# Patient Record
Sex: Male | Born: 1945 | Race: White | Hispanic: No | Marital: Married | Smoking: Never smoker
Health system: Southern US, Community
[De-identification: ages and names within clinical notes are randomized; demographics above are authoritative.]

## PROBLEM LIST (undated history)

## (undated) DIAGNOSIS — H5120 Internuclear ophthalmoplegia, unspecified eye: Secondary | ICD-10-CM

## (undated) DIAGNOSIS — M51369 Other intervertebral disc degeneration, lumbar region without mention of lumbar back pain or lower extremity pain: Secondary | ICD-10-CM

## (undated) DIAGNOSIS — G709 Myoneural disorder, unspecified: Secondary | ICD-10-CM

## (undated) DIAGNOSIS — F329 Major depressive disorder, single episode, unspecified: Secondary | ICD-10-CM

## (undated) DIAGNOSIS — M2170 Unequal limb length (acquired), unspecified site: Secondary | ICD-10-CM

## (undated) DIAGNOSIS — Z87442 Personal history of urinary calculi: Secondary | ICD-10-CM

## (undated) DIAGNOSIS — N529 Male erectile dysfunction, unspecified: Secondary | ICD-10-CM

## (undated) DIAGNOSIS — N200 Calculus of kidney: Secondary | ICD-10-CM

## (undated) DIAGNOSIS — M1990 Unspecified osteoarthritis, unspecified site: Secondary | ICD-10-CM

## (undated) DIAGNOSIS — M5136 Other intervertebral disc degeneration, lumbar region: Secondary | ICD-10-CM

## (undated) DIAGNOSIS — D649 Anemia, unspecified: Secondary | ICD-10-CM

## (undated) DIAGNOSIS — C801 Malignant (primary) neoplasm, unspecified: Secondary | ICD-10-CM

## (undated) DIAGNOSIS — K8020 Calculus of gallbladder without cholecystitis without obstruction: Secondary | ICD-10-CM

## (undated) DIAGNOSIS — H9190 Unspecified hearing loss, unspecified ear: Secondary | ICD-10-CM

## (undated) DIAGNOSIS — E7800 Pure hypercholesterolemia, unspecified: Secondary | ICD-10-CM

## (undated) DIAGNOSIS — I639 Cerebral infarction, unspecified: Secondary | ICD-10-CM

## (undated) DIAGNOSIS — M48061 Spinal stenosis, lumbar region without neurogenic claudication: Secondary | ICD-10-CM

## (undated) DIAGNOSIS — G500 Trigeminal neuralgia: Secondary | ICD-10-CM

## (undated) DIAGNOSIS — I10 Essential (primary) hypertension: Secondary | ICD-10-CM

## (undated) DIAGNOSIS — F32A Depression, unspecified: Secondary | ICD-10-CM

## (undated) DIAGNOSIS — R269 Unspecified abnormalities of gait and mobility: Secondary | ICD-10-CM

## (undated) DIAGNOSIS — F419 Anxiety disorder, unspecified: Secondary | ICD-10-CM

## (undated) HISTORY — PX: JOINT REPLACEMENT: SHX530

## (undated) HISTORY — PX: BACK SURGERY: SHX140

## (undated) HISTORY — DX: Unspecified abnormalities of gait and mobility: R26.9

## (undated) HISTORY — PX: EYE SURGERY: SHX253

## (undated) HISTORY — PX: COLONOSCOPY: SHX174

---

## 2001-02-17 ENCOUNTER — Encounter: Payer: Self-pay | Admitting: Neurology

## 2001-02-17 ENCOUNTER — Ambulatory Visit (HOSPITAL_COMMUNITY): Admission: RE | Admit: 2001-02-17 | Discharge: 2001-02-17 | Payer: Self-pay | Admitting: Neurology

## 2008-10-06 DIAGNOSIS — I639 Cerebral infarction, unspecified: Secondary | ICD-10-CM

## 2008-10-06 HISTORY — DX: Cerebral infarction, unspecified: I63.9

## 2009-05-31 ENCOUNTER — Ambulatory Visit: Payer: Self-pay | Admitting: Gastroenterology

## 2010-07-22 ENCOUNTER — Ambulatory Visit: Payer: Self-pay | Admitting: General Practice

## 2010-08-07 ENCOUNTER — Inpatient Hospital Stay: Payer: Self-pay | Admitting: General Practice

## 2010-08-07 ENCOUNTER — Ambulatory Visit: Payer: Self-pay | Admitting: Cardiology

## 2010-08-07 HISTORY — PX: OTHER SURGICAL HISTORY: SHX169

## 2010-08-13 ENCOUNTER — Encounter: Payer: Self-pay | Admitting: Internal Medicine

## 2011-10-07 DIAGNOSIS — M1990 Unspecified osteoarthritis, unspecified site: Secondary | ICD-10-CM

## 2011-10-07 HISTORY — DX: Unspecified osteoarthritis, unspecified site: M19.90

## 2011-10-13 ENCOUNTER — Encounter: Payer: Self-pay | Admitting: Neurology

## 2011-10-20 ENCOUNTER — Ambulatory Visit: Payer: Self-pay | Admitting: Neurology

## 2011-11-19 ENCOUNTER — Emergency Department: Payer: Self-pay | Admitting: Internal Medicine

## 2011-11-19 ENCOUNTER — Other Ambulatory Visit: Payer: Self-pay | Admitting: Neurological Surgery

## 2011-11-19 DIAGNOSIS — M48061 Spinal stenosis, lumbar region without neurogenic claudication: Secondary | ICD-10-CM

## 2011-11-19 DIAGNOSIS — M47816 Spondylosis without myelopathy or radiculopathy, lumbar region: Secondary | ICD-10-CM

## 2011-11-19 LAB — CBC
HGB: 12.3 g/dL — ABNORMAL LOW (ref 13.0–18.0)
MCH: 28.1 pg (ref 26.0–34.0)
MCV: 85 fL (ref 80–100)
Platelet: 363 10*3/uL (ref 150–440)
WBC: 11.2 10*3/uL — ABNORMAL HIGH (ref 3.8–10.6)

## 2011-11-19 LAB — COMPREHENSIVE METABOLIC PANEL
Anion Gap: 9 (ref 7–16)
Bilirubin,Total: 0.3 mg/dL (ref 0.2–1.0)
Chloride: 102 mmol/L (ref 98–107)
Co2: 28 mmol/L (ref 21–32)
Creatinine: 1.28 mg/dL (ref 0.60–1.30)
EGFR (African American): >60
EGFR (Non-African Amer.): 60 — ABNORMAL LOW
Osmolality: 283 (ref 275–301)
Potassium: 4 mmol/L (ref 3.5–5.1)
Sodium: 139 mmol/L (ref 136–145)

## 2011-11-19 LAB — URINALYSIS, COMPLETE
Bacteria: NONE SEEN
Leukocyte Esterase: NEGATIVE
Nitrite: NEGATIVE
Ph: 5 (ref 4.5–8.0)
Protein: NEGATIVE
RBC,UR: 134 /HPF (ref 0–5)
Squamous Epithelial: NONE SEEN

## 2011-11-20 ENCOUNTER — Ambulatory Visit: Payer: Self-pay | Admitting: Urology

## 2011-11-21 ENCOUNTER — Ambulatory Visit: Payer: Self-pay | Admitting: Urology

## 2011-11-21 ENCOUNTER — Ambulatory Visit
Admission: RE | Admit: 2011-11-21 | Discharge: 2011-11-21 | Disposition: A | Discharge status: Home / Self Care | Payer: Medicare Other | Source: Ambulatory Visit | Attending: Neurological Surgery | Admitting: Neurological Surgery

## 2011-11-21 DIAGNOSIS — M47816 Spondylosis without myelopathy or radiculopathy, lumbar region: Secondary | ICD-10-CM

## 2011-11-21 DIAGNOSIS — M48061 Spinal stenosis, lumbar region without neurogenic claudication: Secondary | ICD-10-CM

## 2011-11-27 ENCOUNTER — Ambulatory Visit: Payer: Self-pay | Admitting: Urology

## 2011-12-05 ENCOUNTER — Ambulatory Visit: Payer: Self-pay | Admitting: Urology

## 2011-12-05 LAB — CREATININE, SERUM: Creatinine: 1.5 mg/dL — ABNORMAL HIGH (ref 0.60–1.30)

## 2012-01-14 ENCOUNTER — Other Ambulatory Visit (HOSPITAL_COMMUNITY): Payer: Self-pay | Admitting: Physician Assistant

## 2012-01-14 ENCOUNTER — Other Ambulatory Visit: Payer: Self-pay | Admitting: Urology

## 2012-01-14 DIAGNOSIS — N2889 Other specified disorders of kidney and ureter: Secondary | ICD-10-CM

## 2012-01-15 ENCOUNTER — Encounter (HOSPITAL_COMMUNITY): Payer: Self-pay | Admitting: Pharmacy Technician

## 2012-01-15 ENCOUNTER — Other Ambulatory Visit: Payer: Self-pay | Admitting: Radiology

## 2012-01-16 ENCOUNTER — Ambulatory Visit (HOSPITAL_COMMUNITY)
Admission: RE | Admit: 2012-01-16 | Discharge: 2012-01-16 | Disposition: A | Discharge status: Home / Self Care | Payer: Medicare Other | Source: Ambulatory Visit | Attending: Urology | Admitting: Urology

## 2012-01-16 ENCOUNTER — Encounter (HOSPITAL_COMMUNITY): Payer: Self-pay

## 2012-01-16 ENCOUNTER — Other Ambulatory Visit: Payer: Self-pay | Admitting: Urology

## 2012-01-16 DIAGNOSIS — N2889 Other specified disorders of kidney and ureter: Secondary | ICD-10-CM

## 2012-01-16 DIAGNOSIS — E7800 Pure hypercholesterolemia, unspecified: Secondary | ICD-10-CM | POA: Insufficient documentation

## 2012-01-16 DIAGNOSIS — N200 Calculus of kidney: Secondary | ICD-10-CM | POA: Insufficient documentation

## 2012-01-16 DIAGNOSIS — C649 Malignant neoplasm of unspecified kidney, except renal pelvis: Secondary | ICD-10-CM | POA: Insufficient documentation

## 2012-01-16 HISTORY — DX: Calculus of kidney: N20.0

## 2012-01-16 HISTORY — DX: Pure hypercholesterolemia, unspecified: E78.00

## 2012-01-16 HISTORY — DX: Essential (primary) hypertension: I10

## 2012-01-16 LAB — PROTIME-INR
INR: 1.05 (ref 0.00–1.49)
Prothrombin Time: 13.9 seconds (ref 11.6–15.2)

## 2012-01-16 LAB — APTT: aPTT: 29 seconds (ref 24–37)

## 2012-01-16 LAB — CBC
Hemoglobin: 12.4 g/dL — ABNORMAL LOW (ref 13.0–17.0)
RBC: 4.64 MIL/uL (ref 4.22–5.81)

## 2012-01-16 MED ORDER — SODIUM CHLORIDE 0.9 % IV SOLN: INTRAVENOUS | Status: DC

## 2012-01-16 MED ORDER — MIDAZOLAM HCL 2 MG/2ML IJ SOLN
INTRAMUSCULAR | Status: AC
  Filled 2012-01-16: qty 4

## 2012-01-16 MED ORDER — FENTANYL CITRATE 0.05 MG/ML IJ SOLN
INTRAMUSCULAR | Status: AC | PRN
  Administered 2012-01-16: 50 ug via INTRAVENOUS

## 2012-01-16 MED ORDER — MIDAZOLAM HCL 5 MG/5ML IJ SOLN
INTRAMUSCULAR | Status: AC | PRN
  Administered 2012-01-16: 1 mg via INTRAVENOUS

## 2012-01-16 MED ORDER — FENTANYL CITRATE 0.05 MG/ML IJ SOLN
INTRAMUSCULAR | Status: AC
  Filled 2012-01-16: qty 4

## 2012-01-16 NOTE — Procedures (Signed)
CT-guided left renal mass.  4 core biopsies and samples placed in saline.  No immediate complication.

## 2012-01-16 NOTE — ED Notes (Signed)
Procedure to be performed in CT;  Unable to visualize mass with [place].

## 2012-01-16 NOTE — H&P (Signed)
Bryan Frey is an 66 y.o. male.   Chief Complaint: renal stone pain 4 months ago; ER workup showed Bilat renal cysts and Left renal mass Scheduled now for left renal mass biopsy HPI: HTN; nephrolithiasis  Past Medical History  Diagnosis Date  . Hypertension   . Nephrolithiasis   . High cholesterol     Past Surgical History  Procedure Date  . Joint replacement     Lt hip    History reviewed. No pertinent family history. Social History:  reports that he has never smoked. He does not have any smokeless tobacco history on file. His alcohol and drug histories not on file.  Allergies: No Known Allergies  Medications Prior to Admission  Medication Sig Dispense Refill  . amLODipine (NORVASC) 10 MG tablet Take 10 mg by mouth daily.      Marland Kitchen aspirin EC 81 MG tablet Take 81 mg by mouth daily.      Marland Kitchen atorvastatin (LIPITOR) 10 MG tablet Take 10 mg by mouth every evening.      . cholecalciferol (VITAMIN D) 1000 UNITS tablet Take 1,000 Units by mouth daily.      . fish oil-omega-3 fatty acids 1000 MG capsule Take 1 g by mouth daily.      . hydrochlorothiazide (HYDRODIURIL) 25 MG tablet Take 25 mg by mouth daily.      . Multiple Vitamin (MULITIVITAMIN WITH MINERALS) TABS Take 1 tablet by mouth daily.      . traMADol (ULTRAM) 50 MG tablet Take 50 mg by mouth every 6 (six) hours as needed. For pain       Medications Prior to Admission  Medication Dose Route Frequency Provider Last Rate Last Dose  . 0.9 %  sodium chloride infusion   Intravenous Continuous Abundio Miu, MD        Results for orders placed during the hospital encounter of 01/16/12 (from the past 48 hour(s))  APTT     Status: Normal   Collection Time   01/16/12  8:56 AM      Component Value Range Comment   aPTT 29  24 - 37 (seconds)   CBC     Status: Abnormal   Collection Time   01/16/12  8:56 AM      Component Value Range Comment   WBC 6.9  4.0 - 10.5 (K/uL)    RBC 4.64  4.22 - 5.81 (MIL/uL)    Hemoglobin 12.4 (*) 13.0 -  17.0 (g/dL)    HCT 62.1 (*) 30.8 - 52.0 (%)    MCV 82.3  78.0 - 100.0 (fL)    MCH 26.7  26.0 - 34.0 (pg)    MCHC 32.5  30.0 - 36.0 (g/dL)    RDW 65.7  84.6 - 96.2 (%)    Platelets 300  150 - 400 (K/uL)   PROTIME-INR     Status: Normal   Collection Time   01/16/12  8:56 AM      Component Value Range Comment   Prothrombin Time 13.9  11.6 - 15.2 (seconds)    INR 1.05  0.00 - 1.49     No results found.  Review of Systems  Constitutional: Negative for fever.  Respiratory: Negative for cough.   Cardiovascular: Negative for chest pain.  Gastrointestinal: Negative for nausea and vomiting.  Genitourinary:       Hx renal stones   Musculoskeletal: Positive for back pain.    Blood pressure 129/80, pulse 80, temperature 96.9 F (36.1 C), temperature source Oral, resp.  rate 18, height 5\' 11"  (1.803 m), weight 242 lb (109.77 kg), SpO2 98.00%. Physical Exam  Constitutional: He is oriented to person, place, and time. He appears well-developed and well-nourished.  HENT:  Head: Normocephalic.  Cardiovascular: Normal rate, regular rhythm and normal heart sounds.   No murmur heard. Respiratory: Effort normal and breath sounds normal. He has no wheezes.  GI: Soft. Bowel sounds are normal. There is no tenderness.  Musculoskeletal: Normal range of motion.       Uses can due to back pain  Neurological: He is alert and oriented to person, place, and time.  Skin: Skin is warm.  Psychiatric: His behavior is normal. Judgment and thought content normal.     Assessment/Plan Left renal mass noted while being worked up for renal stones Scheduled now for biopsy Pt aware of procedure benefits and risks and agreeable to proceed. Consent signed.  Langdon Crosson A 01/16/2012, 9:39 AM

## 2012-01-16 NOTE — Discharge Instructions (Signed)
Biopsy A biopsy is a procedure in which small samples of tissue are removed from the body. The tissue is examined under a microscope. A biopsy may be done to determine the cause (diagnosis) of a condition or mass (tumor). A biopsy may also be done to determine the best treatment for you. In some instances, a biopsy may be performed on normal tissue to determine if cancer has spread or if a transplanted organ is being rejected. There are 2 ways to obtain samples:  Fine needle biopsy. Samples are removed using a thin needle inserted through the skin.   Open biopsy. Samples are removed after a cut (incision) is made through the skin.  LET YOUR CAREGIVER KNOW ABOUT:  Allergies to food or medicine.   Medicines taken, including vitamins, herbs, eyedrops, over-the-counter medicines, and creams.   Use of steroids (by mouth or creams).   Previous problems with anesthetics or numbing medicines.   History of bleeding problems or blood clots.   Previous surgery.   Other health problems, including diabetes and kidney problems.   Possibility of pregnancy, if this applies.  RISKS AND COMPLICATIONS  Bleeding from the biopsy site. The risk of bleeding is higher if you have a bleeding disorder or are taking any blood thinning medicines (anticoagulants).   Infection.   Injury to organs or structures near the biopsy site.   Chronic pain at the biopsy site. This is defined as pain that lasts for more than 3 months.   Very rarely, a second biopsy may be required if not enough tissue was collected during the first biopsy.  BEFORE THE PROCEDURE Ask your caregiver what time you need to arrive for your procedure. Ask your caregiver whether you need to stop eating or drinking (fast) before your procedure. Ask your caregiver about changing or stopping your regular medicines. A blood sample may be done to determine your blood clotting time. Medicine may be given to help you relax (sedative). PROCEDURE During  a fine needle biopsy, you will be awake during the procedure. You will be positioned to allow the best possible access to the biopsy site. Let your caregiver know if the position is not comfortable. The biopsy site will be cleaned. A needle is inserted through your skin. You may feel mild discomfort during this procedure. The needle is withdrawn once tissue samples have been removed. Pressure may be applied to the biopsy site to reduce swelling and to ensure that bleeding has stopped. The samples will be sent to be examined. During an open biopsy, you may be given medicine that numbs the area (local anesthetic) or medicine that makes you sleep (general anesthetic). An incision is made through the skin. A tissue sample or the entire mass is removed. The sample or mass will be sent to be examined. Sometimes, the sample or mass may be examined during the procedure. If the sample or mass contains cancer cells, further tissue or structures may be removed. The incision is then closed with stitches (sutures) or skin glue (adhesive). AFTER THE PROCEDURE Your recovery will be assessed and monitored. If there are no problems, you should be able to go home shortly after the procedure (outpatient). You will need to arrange for someone to drive you home if you received a sedative or pain relieving medicine during the procedure. Ask when your test results will be ready. Make sure you get your test results. Document Released: 09/19/2000 Document Revised: 09/11/2011 Document Reviewed: 03/20/2011 [employer] Patient Information 2012 [employer]. 

## 2012-01-16 NOTE — ED Notes (Signed)
Requested SS-C bed 

## 2012-01-19 ENCOUNTER — Telehealth (HOSPITAL_COMMUNITY): Payer: Self-pay | Admitting: Radiology

## 2012-01-27 ENCOUNTER — Encounter (HOSPITAL_COMMUNITY): Payer: Self-pay | Admitting: Respiratory Therapy

## 2012-01-27 ENCOUNTER — Other Ambulatory Visit: Payer: Self-pay | Admitting: Neurological Surgery

## 2012-02-03 ENCOUNTER — Encounter (HOSPITAL_COMMUNITY): Payer: Self-pay

## 2012-02-03 ENCOUNTER — Encounter (HOSPITAL_COMMUNITY)
Admission: RE | Admit: 2012-02-03 | Discharge: 2012-02-03 | Disposition: A | Discharge status: Home / Self Care | Payer: Medicare Other | Source: Ambulatory Visit | Attending: Neurological Surgery | Admitting: Neurological Surgery

## 2012-02-03 ENCOUNTER — Encounter (HOSPITAL_COMMUNITY)
Admission: RE | Admit: 2012-02-03 | Discharge: 2012-02-03 | Disposition: A | Discharge status: Home / Self Care | Payer: Medicare Other | Source: Ambulatory Visit | Attending: Anesthesiology | Admitting: Anesthesiology

## 2012-02-03 ENCOUNTER — Other Ambulatory Visit: Payer: Self-pay | Admitting: Urology

## 2012-02-03 DIAGNOSIS — C642 Malignant neoplasm of left kidney, except renal pelvis: Secondary | ICD-10-CM

## 2012-02-03 HISTORY — DX: Malignant (primary) neoplasm, unspecified: C80.1

## 2012-02-03 HISTORY — DX: Unspecified osteoarthritis, unspecified site: M19.90

## 2012-02-03 LAB — BASIC METABOLIC PANEL
Calcium: 9.9 mg/dL (ref 8.4–10.5)
Creatinine, Ser: 1.04 mg/dL (ref 0.50–1.35)
GFR calc non Af Amer: 72 mL/min — ABNORMAL LOW (ref >90)
Glucose, Bld: 103 mg/dL — ABNORMAL HIGH (ref 70–99)
Sodium: 139 mEq/L (ref 135–145)

## 2012-02-03 LAB — CBC
MCH: 26.9 pg (ref 26.0–34.0)
MCHC: 32.4 g/dL (ref 30.0–36.0)
MCV: 82.9 fL (ref 78.0–100.0)
Platelets: 313 10*3/uL (ref 150–400)
RBC: 4.84 MIL/uL (ref 4.22–5.81)
RDW: 14.4 % (ref 11.5–15.5)

## 2012-02-03 LAB — SURGICAL PCR SCREEN: MRSA, PCR: NEGATIVE

## 2012-02-03 NOTE — Pre-Procedure Instructions (Signed)
19 Bryan Frey  02/03/2012   Your procedure is scheduled on:  02/09/2012  Report to [facility] at 0530 AM.  Call this number if you have problems the morning of surgery: [phone]   Remember:   Do not eat food:After Midnight.  May have clear liquids: up to 4 Hours before arrival.0130 am . Do not drink liquids after 0130 am the day of surgery  Clear liquids include soda, tea, black coffee, apple or grape juice, broth.  Take these medicines the morning of surgery with A SIP OF WATER: norvasc  ultram   Do not wear jewelry, make-up or nail polish.  Do not wear lotions, powders, or perfumes. You may wear deodorant.  Do not shave 48 hours prior to surgery.  Do not bring valuables to the hospital.  Contacts, dentures or bridgework may not be worn into surgery.  Leave suitcase in the car. After surgery it may be brought to your room.  For patients admitted to the hospital, checkout time is 11:00 AM the day of discharge.   Patients discharged the day of surgery will not be allowed to drive home.  Name and phone number of your driver: Aurther Loft- wife [phone]  Special Instructions: CHG Shower Use Special Wash: 1/2 bottle night before surgery and 1/2 bottle morning of surgery.   Please read over the following fact sheets that you were given: Pain Booklet, Coughing and Deep Breathing, MRSA Information and Surgical Site Infection Prevention

## 2012-02-08 MED ORDER — CEFAZOLIN SODIUM-DEXTROSE 2-3 GM-% IV SOLR
2.0000 g | INTRAVENOUS | Status: AC
  Administered 2012-02-09: 2 g via INTRAVENOUS
  Filled 2012-02-08: qty 50

## 2012-02-09 ENCOUNTER — Encounter (HOSPITAL_COMMUNITY): Payer: Self-pay | Admitting: Certified Registered"

## 2012-02-09 ENCOUNTER — Encounter (HOSPITAL_COMMUNITY)
Admission: RE | Disposition: A | Discharge status: Home / Self Care | Payer: Self-pay | Source: Ambulatory Visit | Attending: Neurological Surgery

## 2012-02-09 ENCOUNTER — Encounter (HOSPITAL_COMMUNITY): Payer: Self-pay | Admitting: *Deleted

## 2012-02-09 ENCOUNTER — Inpatient Hospital Stay (HOSPITAL_COMMUNITY): Payer: Medicare Other | Admitting: Certified Registered"

## 2012-02-09 ENCOUNTER — Inpatient Hospital Stay (HOSPITAL_COMMUNITY): Payer: Medicare Other

## 2012-02-09 ENCOUNTER — Inpatient Hospital Stay (HOSPITAL_COMMUNITY)
Admission: RE | Admit: 2012-02-09 | Discharge: 2012-02-10 | DRG: 491 | Disposition: A | Discharge status: Home / Self Care | Payer: Medicare Other | Source: Ambulatory Visit | Attending: Neurological Surgery | Admitting: Neurological Surgery

## 2012-02-09 DIAGNOSIS — Z01810 Encounter for preprocedural cardiovascular examination: Secondary | ICD-10-CM

## 2012-02-09 DIAGNOSIS — Z7982 Long term (current) use of aspirin: Secondary | ICD-10-CM

## 2012-02-09 DIAGNOSIS — E7800 Pure hypercholesterolemia, unspecified: Secondary | ICD-10-CM | POA: Diagnosis present

## 2012-02-09 DIAGNOSIS — M47817 Spondylosis without myelopathy or radiculopathy, lumbosacral region: Principal | ICD-10-CM | POA: Diagnosis present

## 2012-02-09 DIAGNOSIS — Z96649 Presence of unspecified artificial hip joint: Secondary | ICD-10-CM

## 2012-02-09 DIAGNOSIS — M48062 Spinal stenosis, lumbar region with neurogenic claudication: Secondary | ICD-10-CM | POA: Diagnosis present

## 2012-02-09 DIAGNOSIS — I10 Essential (primary) hypertension: Secondary | ICD-10-CM | POA: Diagnosis present

## 2012-02-09 DIAGNOSIS — Z85528 Personal history of other malignant neoplasm of kidney: Secondary | ICD-10-CM

## 2012-02-09 DIAGNOSIS — M4716 Other spondylosis with myelopathy, lumbar region: Secondary | ICD-10-CM

## 2012-02-09 DIAGNOSIS — Z01812 Encounter for preprocedural laboratory examination: Secondary | ICD-10-CM

## 2012-02-09 DIAGNOSIS — Z79899 Other long term (current) drug therapy: Secondary | ICD-10-CM

## 2012-02-09 DIAGNOSIS — Z01818 Encounter for other preprocedural examination: Secondary | ICD-10-CM

## 2012-02-09 HISTORY — PX: LUMBAR LAMINECTOMY/DECOMPRESSION MICRODISCECTOMY: SHX5026

## 2012-02-09 SURGERY — LUMBAR LAMINECTOMY/DECOMPRESSION MICRODISCECTOMY 2 LEVELS
Anesthesia: General | Site: Spine Lumbar | Laterality: Bilateral | Wound class: Clean

## 2012-02-09 MED ORDER — ATORVASTATIN CALCIUM 10 MG PO TABS
10.0000 mg | ORAL_TABLET | Freq: Every day | ORAL | Status: DC
  Administered 2012-02-09: 10 mg via ORAL
  Filled 2012-02-09 (×2): qty 1

## 2012-02-09 MED ORDER — KETOROLAC TROMETHAMINE 30 MG/ML IJ SOLN
30.0000 mg | Freq: Three times a day (TID) | INTRAMUSCULAR | Status: DC
  Administered 2012-02-09 – 2012-02-10 (×4): 30 mg via INTRAVENOUS
  Filled 2012-02-09 (×5): qty 1

## 2012-02-09 MED ORDER — SODIUM CHLORIDE 0.9 % IR SOLN
Status: DC | PRN
  Administered 2012-02-09: 09:00:00

## 2012-02-09 MED ORDER — TRAMADOL HCL 50 MG PO TABS
100.0000 mg | ORAL_TABLET | Freq: Two times a day (BID) | ORAL | Status: DC | PRN
  Filled 2012-02-09: qty 2

## 2012-02-09 MED ORDER — PROPOFOL 10 MG/ML IV EMUL
INTRAVENOUS | Status: DC | PRN
  Administered 2012-02-09: 200 mg via INTRAVENOUS

## 2012-02-09 MED ORDER — LIDOCAINE-EPINEPHRINE 1 %-1:100000 IJ SOLN
INTRAMUSCULAR | Status: DC | PRN
  Administered 2012-02-09: 10 mL

## 2012-02-09 MED ORDER — AMLODIPINE BESYLATE 10 MG PO TABS
10.0000 mg | ORAL_TABLET | Freq: Every day | ORAL | Status: DC
  Administered 2012-02-10: 10 mg via ORAL
  Filled 2012-02-09 (×2): qty 1

## 2012-02-09 MED ORDER — HEMOSTATIC AGENTS (NO CHARGE) OPTIME
TOPICAL | Status: DC | PRN
  Administered 2012-02-09: 1 via TOPICAL

## 2012-02-09 MED ORDER — 0.9 % SODIUM CHLORIDE (POUR BTL) OPTIME
TOPICAL | Status: DC | PRN
  Administered 2012-02-09: 1000 mL

## 2012-02-09 MED ORDER — ACETAMINOPHEN 325 MG PO TABS: 650.0000 mg | ORAL_TABLET | ORAL | Status: DC | PRN

## 2012-02-09 MED ORDER — ALUM & MAG HYDROXIDE-SIMETH 200-200-20 MG/5ML PO SUSP: 30.0000 mL | Freq: Four times a day (QID) | ORAL | Status: DC | PRN

## 2012-02-09 MED ORDER — MORPHINE SULFATE 2 MG/ML IJ SOLN: 1.0000 mg | INTRAMUSCULAR | Status: DC | PRN

## 2012-02-09 MED ORDER — ONDANSETRON HCL 4 MG/2ML IJ SOLN: 4.0000 mg | INTRAMUSCULAR | Status: DC | PRN

## 2012-02-09 MED ORDER — FENTANYL CITRATE 0.05 MG/ML IJ SOLN
INTRAMUSCULAR | Status: DC | PRN
  Administered 2012-02-09: 150 ug via INTRAVENOUS
  Administered 2012-02-09: 25 ug via INTRAVENOUS
  Administered 2012-02-09: 50 ug via INTRAVENOUS
  Administered 2012-02-09: 25 ug via INTRAVENOUS
  Administered 2012-02-09: 50 ug via INTRAVENOUS

## 2012-02-09 MED ORDER — HYDROMORPHONE HCL PF 1 MG/ML IJ SOLN: 0.2500 mg | INTRAMUSCULAR | Status: DC | PRN

## 2012-02-09 MED ORDER — SODIUM CHLORIDE 0.9 % IV SOLN
INTRAVENOUS | Status: AC
  Filled 2012-02-09: qty 500

## 2012-02-09 MED ORDER — BUPIVACAINE HCL (PF) 0.5 % IJ SOLN
INTRAMUSCULAR | Status: DC | PRN
  Administered 2012-02-09: 10 mL

## 2012-02-09 MED ORDER — SODIUM CHLORIDE 0.9 % IV SOLN: 250.0000 mL | INTRAVENOUS | Status: DC

## 2012-02-09 MED ORDER — BACITRACIN 50000 UNITS IM SOLR
INTRAMUSCULAR | Status: AC
  Filled 2012-02-09: qty 1

## 2012-02-09 MED ORDER — SODIUM CHLORIDE 0.9 % IJ SOLN
3.0000 mL | Freq: Two times a day (BID) | INTRAMUSCULAR | Status: DC
  Administered 2012-02-09 – 2012-02-10 (×2): 3 mL via INTRAVENOUS

## 2012-02-09 MED ORDER — ROCURONIUM BROMIDE 100 MG/10ML IV SOLN
INTRAVENOUS | Status: DC | PRN
  Administered 2012-02-09: 50 mg via INTRAVENOUS

## 2012-02-09 MED ORDER — HYDROCHLOROTHIAZIDE 25 MG PO TABS
25.0000 mg | ORAL_TABLET | Freq: Every day | ORAL | Status: DC
  Administered 2012-02-09 – 2012-02-10 (×2): 25 mg via ORAL
  Filled 2012-02-09 (×2): qty 1

## 2012-02-09 MED ORDER — DEXAMETHASONE SODIUM PHOSPHATE 4 MG/ML IJ SOLN
INTRAMUSCULAR | Status: DC | PRN
  Administered 2012-02-09: 10 mg via INTRAVENOUS

## 2012-02-09 MED ORDER — ONDANSETRON HCL 4 MG/2ML IJ SOLN
INTRAMUSCULAR | Status: DC | PRN
  Administered 2012-02-09: 4 mg via INTRAVENOUS

## 2012-02-09 MED ORDER — KETOROLAC TROMETHAMINE 60 MG/2ML IM SOLN
INTRAMUSCULAR | Status: DC | PRN
  Administered 2012-02-09: 30 mg via INTRAMUSCULAR

## 2012-02-09 MED ORDER — MENTHOL 3 MG MT LOZG: 1.0000 | LOZENGE | OROMUCOSAL | Status: DC | PRN

## 2012-02-09 MED ORDER — ACETAMINOPHEN 650 MG RE SUPP: 650.0000 mg | RECTAL | Status: DC | PRN

## 2012-02-09 MED ORDER — ONDANSETRON HCL 4 MG/2ML IJ SOLN: 4.0000 mg | Freq: Once | INTRAMUSCULAR | Status: DC | PRN

## 2012-02-09 MED ORDER — LIDOCAINE HCL (CARDIAC) 20 MG/ML IV SOLN
INTRAVENOUS | Status: DC | PRN
  Administered 2012-02-09: 80 mg via INTRAVENOUS

## 2012-02-09 MED ORDER — LACTATED RINGERS IV SOLN
INTRAVENOUS | Status: DC | PRN
  Administered 2012-02-09 (×2): via INTRAVENOUS

## 2012-02-09 MED ORDER — EPHEDRINE SULFATE 50 MG/ML IJ SOLN
INTRAMUSCULAR | Status: DC | PRN
Start: 2012-02-09 — End: 2012-02-09
  Administered 2012-02-09: 10 mg via INTRAVENOUS

## 2012-02-09 MED ORDER — OXYCODONE-ACETAMINOPHEN 5-325 MG PO TABS
1.0000 | ORAL_TABLET | ORAL | Status: DC | PRN
  Administered 2012-02-09 (×2): 1 via ORAL
  Administered 2012-02-09 – 2012-02-10 (×2): 2 via ORAL
  Filled 2012-02-09: qty 1
  Filled 2012-02-09: qty 2
  Filled 2012-02-09: qty 1
  Filled 2012-02-09: qty 2

## 2012-02-09 MED ORDER — SODIUM CHLORIDE 0.9 % IJ SOLN: 3.0000 mL | INTRAMUSCULAR | Status: DC | PRN

## 2012-02-09 MED ORDER — PHENOL 1.4 % MT LIQD: 1.0000 | OROMUCOSAL | Status: DC | PRN

## 2012-02-09 MED ORDER — THROMBIN 5000 UNITS EX KIT
PACK | CUTANEOUS | Status: DC | PRN
  Administered 2012-02-09 (×2): 5000 [IU] via TOPICAL

## 2012-02-09 MED ORDER — TAMSULOSIN HCL 0.4 MG PO CAPS
0.4000 mg | ORAL_CAPSULE | Freq: Every day | ORAL | Status: DC
  Administered 2012-02-09 – 2012-02-10 (×2): 0.4 mg via ORAL
  Filled 2012-02-09 (×2): qty 1

## 2012-02-09 SURGICAL SUPPLY — 55 items
BAG DECANTER FOR FLEXI CONT (MISCELLANEOUS) ×2 IMPLANT
BLADE SURG ROTATE 9660 (MISCELLANEOUS) ×2 IMPLANT
BUR ACORN 6.0 (BURR) IMPLANT
BUR MATCHSTICK NEURO 3.0 LAGG (BURR) ×2 IMPLANT
CANISTER SUCTION 2500CC (MISCELLANEOUS) ×2 IMPLANT
CLOTH BEACON ORANGE TIMEOUT ST (SAFETY) ×2 IMPLANT
CONT SPEC 4OZ CLIKSEAL STRL BL (MISCELLANEOUS) ×2 IMPLANT
DECANTER SPIKE VIAL GLASS SM (MISCELLANEOUS) ×2 IMPLANT
DERMABOND ADHESIVE PROPEN (GAUZE/BANDAGES/DRESSINGS) ×1
DERMABOND ADVANCED (GAUZE/BANDAGES/DRESSINGS)
DERMABOND ADVANCED .7 DNX12 (GAUZE/BANDAGES/DRESSINGS) IMPLANT
DERMABOND ADVANCED .7 DNX6 (GAUZE/BANDAGES/DRESSINGS) ×1 IMPLANT
DRAPE LAPAROTOMY 100X72X124 (DRAPES) ×2 IMPLANT
DRAPE MICROSCOPE LEICA (MISCELLANEOUS) ×2 IMPLANT
DRAPE POUCH INSTRU U-SHP 10X18 (DRAPES) ×2 IMPLANT
DRAPE PROXIMA HALF (DRAPES) ×2 IMPLANT
DURAPREP 26ML APPLICATOR (WOUND CARE) ×2 IMPLANT
ELECT BLADE 4.0 EZ CLEAN MEGAD (MISCELLANEOUS) ×4
ELECT REM PT RETURN 9FT ADLT (ELECTROSURGICAL) ×2
ELECTRODE BLDE 4.0 EZ CLN MEGD (MISCELLANEOUS) ×2 IMPLANT
ELECTRODE REM PT RTRN 9FT ADLT (ELECTROSURGICAL) ×1 IMPLANT
GAUZE SPONGE 4X4 16PLY XRAY LF (GAUZE/BANDAGES/DRESSINGS) ×2 IMPLANT
GLOVE BIOGEL PI IND STRL 8 (GLOVE) ×1 IMPLANT
GLOVE BIOGEL PI IND STRL 8.5 (GLOVE) ×1 IMPLANT
GLOVE BIOGEL PI INDICATOR 8 (GLOVE) ×1
GLOVE BIOGEL PI INDICATOR 8.5 (GLOVE) ×1
GLOVE ECLIPSE 7.5 STRL STRAW (GLOVE) ×6 IMPLANT
GLOVE ECLIPSE 8.5 STRL (GLOVE) ×4 IMPLANT
GLOVE EXAM NITRILE LRG STRL (GLOVE) IMPLANT
GLOVE EXAM NITRILE MD LF STRL (GLOVE) IMPLANT
GLOVE EXAM NITRILE XL STR (GLOVE) IMPLANT
GLOVE EXAM NITRILE XS STR PU (GLOVE) IMPLANT
GOWN BRE IMP SLV AUR LG STRL (GOWN DISPOSABLE) IMPLANT
GOWN BRE IMP SLV AUR XL STRL (GOWN DISPOSABLE) ×2 IMPLANT
GOWN STRL REIN 2XL LVL4 (GOWN DISPOSABLE) ×4 IMPLANT
KIT BASIN OR (CUSTOM PROCEDURE TRAY) ×2 IMPLANT
KIT ROOM TURNOVER OR (KITS) ×2 IMPLANT
NEEDLE HYPO 22GX1.5 SAFETY (NEEDLE) ×2 IMPLANT
NEEDLE SPNL 18GX3.5 QUINCKE PK (NEEDLE) ×2 IMPLANT
NEEDLE SPNL 20GX3.5 QUINCKE YW (NEEDLE) IMPLANT
NS IRRIG 1000ML POUR BTL (IV SOLUTION) ×2 IMPLANT
PACK LAMINECTOMY NEURO (CUSTOM PROCEDURE TRAY) ×2 IMPLANT
PAD ARMBOARD 7.5X6 YLW CONV (MISCELLANEOUS) ×10 IMPLANT
PATTIES SURGICAL .5 X1 (DISPOSABLE) ×2 IMPLANT
RUBBERBAND STERILE (MISCELLANEOUS) ×4 IMPLANT
SPONGE GAUZE 4X4 12PLY (GAUZE/BANDAGES/DRESSINGS) ×2 IMPLANT
SPONGE SURGIFOAM ABS GEL SZ50 (HEMOSTASIS) ×2 IMPLANT
SUT VIC AB 1 CT1 18XBRD ANBCTR (SUTURE) ×1 IMPLANT
SUT VIC AB 1 CT1 8-18 (SUTURE) ×1
SUT VIC AB 2-0 CP2 18 (SUTURE) ×2 IMPLANT
SUT VIC AB 3-0 SH 8-18 (SUTURE) ×4 IMPLANT
SYR 20ML ECCENTRIC (SYRINGE) ×2 IMPLANT
TOWEL OR 17X24 6PK STRL BLUE (TOWEL DISPOSABLE) ×2 IMPLANT
TOWEL OR 17X26 10 PK STRL BLUE (TOWEL DISPOSABLE) ×2 IMPLANT
WATER STERILE IRR 1000ML POUR (IV SOLUTION) ×2 IMPLANT

## 2012-02-09 NOTE — Transfer of Care (Signed)
Immediate Anesthesia Transfer of Care Note  Patient: Bryan Frey  Procedure(s) Performed: Procedure(s) (LRB): LUMBAR LAMINECTOMY/DECOMPRESSION MICRODISCECTOMY 2 LEVELS (Bilateral)  Patient Location: PACU  Anesthesia Type: General  Level of Consciousness: awake, alert , oriented and patient cooperative  Airway & Oxygen Therapy: Patient Spontanous Breathing and Patient connected to face mask oxygen  Post-op Assessment: Report given to PACU RN, Post -op Vital signs reviewed and stable and Patient moving all extremities  Post vital signs: Reviewed and stable  Complications: No apparent anesthesia complications

## 2012-02-09 NOTE — Anesthesia Postprocedure Evaluation (Signed)
  Anesthesia Post-op Note  Patient: Bryan Frey  Procedure(s) Performed: Procedure(s) (LRB): LUMBAR LAMINECTOMY/DECOMPRESSION MICRODISCECTOMY 2 LEVELS (Bilateral)  Patient Location: PACU  Anesthesia Type: General  Level of Consciousness: awake  Airway and Oxygen Therapy: Patient Spontanous Breathing  Post-op Pain: mild  Post-op Assessment: Post-op Vital signs reviewed  Post-op Vital Signs: Reviewed  Complications: No apparent anesthesia complications

## 2012-02-09 NOTE — Preoperative (Signed)
Beta Blockers   Reason not to administer Beta Blockers:Not Applicable 

## 2012-02-09 NOTE — Op Note (Signed)
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 Assistant: Lelon Perla M.D. Anesthesia: Gen. endotracheal Indications: Patient is a 66 year old individual who has had significant problems with pain in his lower extremities particularly worse when he walks even a short distance occasionally the pain would be right-sided occasionally left-sided and many times it would be bilateral. He is tried all manner of extensive care and has failed and he is been advised regarding the need for surgical decompression of the L2-3 and L4-5 regions.  Procedure: Patient was brought to the operating room supine on a stretcher. After the smooth induction of general endotracheal anesthesia he was turned prone onto the operating table. The back was prepped with alcohol and DuraPrep and draped in a sterile fashion. Localizing radiographs identified the interspace at L3-4. A midline incision was created and carried down to the lumbar dorsal fascia which was opened on either side of midline at this level. The dissection was carried out over the interlaminar space and the facet joints at L2-3 and L4-L5. A self-retaining retractor was placed in the wound. A high-speed drill was then used to remove the inferior margin of the lamina out to the medial wall the facet performing the initial portion of the dissection. The yellow ligament was then taken up and removed. Common dural tube was identified and dissection was carefully undertaken removing redundant yellow ligament and overgrown facet from the superior facet of L2 and the laminar arch of L3. A foraminotomy was created over the L2 and L3 nerve root. The largest part of the patient's problem was in the foramen itself and this was particularly the superior foramen for the L2 nerve root. This  was undercut substantially to increase the size of the foramen at this level on both sides.  Once L2-3 was decompressed attention was turned to L4-5 where a similar laminotomy and foraminotomy was created in each case identifying and protecting the L4 nerve root superiorly and the L5 nerve root inferiorly. A combination of curettes and cupped curetting instruments were used to facilitate decompression of the superior nerve root. The inferior nerve root appeared to be easy to decompress. This was done under the operating microscope in the area of L2-L3 was further inspected on the right side and there was found to be a disc herniation on the right side in the foramen at L2-L3. This was decompressed.  Once a thorough decompression was performed the microscope was removed the wound was checked for hemostasis and closed in layers with #1 Vicryl in the lumbar dorsal fascia 2-0 Vicryl subcutaneously 3-0 Vicryl subcuticularly 20 cc of half percent Marcaine was introduced injected into the fascia. The patient tolerated the procedure well blood blood loss was estimated at 500 cc.

## 2012-02-09 NOTE — Anesthesia Procedure Notes (Signed)
Procedure Name: Intubation Date/Time: 02/09/2012 8:06 AM Performed by: Jerilee Hoh Pre-anesthesia Checklist: Patient identified, Emergency Drugs available, Suction available and Patient being monitored Patient Re-evaluated:Patient Re-evaluated prior to inductionOxygen Delivery Method: Circle system utilized Preoxygenation: Pre-oxygenation with 100% oxygen Intubation Type: IV induction Ventilation: Mask ventilation with difficulty and Oral airway inserted - appropriate to patient size Laryngoscope Size: Mac and 4 Grade View: Grade I Tube type: Oral Tube size: 7.5 mm Number of attempts: 1 Airway Equipment and Method: Stylet Placement Confirmation: ETT inserted through vocal cords under direct vision,  positive ETCO2 and breath sounds checked- equal and bilateral Secured at: 22 cm Tube secured with: Tape Dental Injury: Teeth and Oropharynx as per pre-operative assessment

## 2012-02-09 NOTE — H&P (Addendum)
Bryan Frey is an 66 y.o. male.   Chief Complaint: Back and bilateral leg HPI:  Bryan Frey returns to the office today having had an MRI of his lumbar spine to discuss the significance of any findings of spondylosis.  I did some plain x-rays which showed that he had some degenerative changes and a retrolisthesis of L2 on L3.  The MRI of the lumbar spine performed on 11/21/2011 demonstrates that there is a severe rather high-grade stenosis at L2-L3 secondary to a combination of ligamentous overgrowth, retrolisthesis and disc bulging.  There is facet hypertrophy at that level.  At L3-L4 his spine appears fairly stable, though he does have some spondylitic disease.  At L4-L5 there is marked hypertrophy of the facets causing again a severe central canal stenosis with some lateral recess stenosis at that level.  I note that there are some modest degenerative changes and a slight anterolisthesis of L4 on L5.    I discussed the findings with the patient and indicated that ultimately Bryan Frey may need to consider surgical decompression of L2-3 and L4-5.  I discussed the concern if there is instability that worsens, he may ultimately need a fusion, but, in my opinion, his condition shows enough spondylitic degeneration that he will not likely need surgical stabilization.  We discussed the fact that this is a risk of the procedure, but overall I believe the best way to get him relief of his neurogenic claudication symptoms and chronic radicular pain is with a simple decompression of the lumbar spine.    Bryan Frey tells me that in the interim he was found to have some kidney lesions.  These are to be worked up with an MRI.  He may require biopsy.  I suggested that he may benefit from getting this done first.  However, he also requested whether he could be seen somewhere in the [place] area and I would suggest the [facility] and particularly Dr. Marcine Matar.  We will see if we can get him  plugged in for that purpose.  I will continue to plan is surgical intervention depending what is found on his MRI.    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  At this point, I believe the problem in his lumbar spine takes precedence as this is what is giving him his worst symptoms.  We will try to expedite treatment of that process first.          Stefani Dama, M.D./sv NEUROSURGICAL CONSULTATION    CHIEF COMPLAINT:    Bilateral lower extremity weakness in the proximal legs going on for several months now.  HISTORY OF PRESENT ILLNESS:  Bryan Frey is a 66 year old right-handed individual who tells me that he has had some problems with progressive weakness in the lower extremities that has gotten worse significantly in the past couple of months.  He had been seen by Dr. Sherryll Burger, a neurologist at the [facility] and at that time he was noticing some change in his legs for the past several months.  Dr. 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  He also underwent an MRI of the cervical spine which demonstrated evidence of cord compression at C5-6 and C6-7 secondary to spondylitic overgrowth.  The cord demonstrates a large central protrusion of the disc at each  level with some flattening of the cord but no overt intrinsic spinal cord changes.  The patient notes that he has had some dysesthesias in the right upper extremity along with some weakness there.  He notes that his gait has deteriorated.  It is particularly hard for him to get out of a seated position and taking the first several steps are quite difficult.  He uses a cane to get around pretty regularly.  He notes that after he moves for a little bit his legs tend to move a bit better.  In November of 2011, he underwent a hip replacement on the left side.  He  notes that after the hip replacement he could walk well until about mid-2012 when the symptoms started.  He also notes he has lost a fair amount of bulk in his thigh musculature.    PAST MEDICAL HISTORY:   His general health has been good.  He does have some hypertension.  Aside from the left hip replacement, he tells me he was never in the hospital before.    MEDICATIONS:    Currently he is medicated with Amlodipine Besylate and Hydrochlorothiazide 10/25, Tramadol for pain and Atorvastatin 10 mg. q.d.    ALLERGIES:     No known drug allergies.    SOCIAL HISTORY:    He does not smoke. He does not drink alcohol.  Height and weight have been stable.    REVIEW OF SYSTEMS:   He describes some swallowing difficulties which were felt to possibly be due to some ventral osteophytes in his neck.  He also describes an unusual pain along the left forehead radiating down in the region of V2 on the left side.  This is very sharp excruciating pain that occurs not infrequently.  He also notes a dysesthetic sensation of his right upper extremity. Otherwise on symptoms review, he notes leg weakness, leg pain while walking, high cholesterol, high blood pressure, ringing in the ears and wearing of glasses and night sweats.  PHYSICAL EXAMINATION:   On examination, I note that he will stand straight and erect after a brief period of time but he tends to favor a 10 degree forward stoop. His motor function reveals weakness in the iliopsoas and quadriceps both graded at 4/5, tibialis anterior and gastrocs are graded at 5/5 as noted on his gait.  DTR's are absent in the patella bilaterally.  Trace Achilles on the left and absent on the right.    IMPRESSION/PLAN:    The patient has difficulties with weakness in his lower extremity.  His exam noted that he does not have any fasciculations in his legs and EMG and nerve conduction studies by Dr. Sherryll Burger shows only a mild sensory type neuropathy.  However, the spinal cord does show  compression at C5-6 and C6-7 but there are no intrinsic cord changes.    Today in the office I obtained some plain radiographs of the lumbar spine to complete his workup.  This demonstrates that he has some moderate degrees of spondylitic changes in the low back. Particularly he has a retrolisthesis of L2 on L3 and this could be an area of some fairly focal stenosis that could explain some of his neuropathic symptoms.  I demonstrated the findings to the patient and his wife and I explained to them my concern that the symptoms that he has in his lower extremities are not well explained by the focal cord compression in his neck.  Indeed, cord compression can cause weakness in the  legs but generally is a much more diffuse process in the lower extremities and not very focally related to the major thigh muscles.  Because of the findings on the plain x-ray, I suggested that we followup with an MRI of the lumbar spine. I noted that the EMG and NCV study rules out some very important intrinsic diseases of the nerves but I am not certain that the MRI picture of the neck gives [place] a good reason for the symptoms he is experiencing.  Before I would suggest any kind of intervention to the cervical spine, I believe that the lumbar spine needs to be worked up more fully as I suspect the findings to significant stenosis at L2-3 may be the more certain cause of his difficulties at the current time.  I will see him back after the MRI is completed of the lumbar spine.    Past Medical History  Diagnosis Date  . Hypertension   . Nephrolithiasis   . High cholesterol   . 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  . 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 Joint replacement 2011    Lt hip    Family History  Problem Relation Age of Onset  . Anesthesia problems Neg Hx    Social History:  reports that he has never smoked. He does not have any smokeless tobacco history on file. He reports that he uses  illicit drugs (Other-see comments). He reports that he does not drink alcohol.  Allergies: No Known Allergies  Medications Prior to Admission  Medication Sig Dispense Refill  . amLODipine (NORVASC) 10 MG tablet Take 10 mg by mouth daily.      Marland Kitchen aspirin EC 81 MG tablet Take 81 mg by mouth daily.      Marland Kitchen atorvastatin (LIPITOR) 10 MG tablet Take 10 mg by mouth at bedtime.       . cholecalciferol (VITAMIN D) 1000 UNITS tablet Take 1,000 Units by mouth daily.      . Cyanocobalamin (VITAMIN B 12 PO) Take 1,000 mg by mouth daily.      . fish oil-omega-3 fatty acids 1000 MG capsule Take 1 g by mouth 2 (two) times daily.       . folic acid (FOLVITE) 400 MCG tablet Take 400 mcg by mouth daily.      . hydrochlorothiazide (HYDRODIURIL) 25 MG tablet Take 25 mg by mouth daily.      . Multiple Vitamin (MULITIVITAMIN WITH MINERALS) TABS Take 1 tablet by mouth daily.      . Tamsulosin HCl (FLOMAX) 0.4 MG CAPS Take 0.4 mg by mouth daily.      . traMADol (ULTRAM) 50 MG tablet Take 100 mg by mouth 2 (two) times daily as needed. For pain        No results found for this or any previous visit (from the past 48 hour(s)). No results found.  Review of Systems  Constitutional: Negative.   HENT: Negative.   Eyes: Negative.   Respiratory: Negative.   Cardiovascular: Negative.   Gastrointestinal: Negative.   Genitourinary: Negative.   Musculoskeletal: Negative.   Skin: Negative.   Neurological:       Numbness and paresthesias in both lower extremities alternating and occasionally bilateral. Increasing pain with exercise.  Endo/Heme/Allergies: Negative.   Psychiatric/Behavioral: Negative.     Blood pressure 117/78, pulse 67, temperature 98 F (36.7 C), temperature source Oral, resp. rate 18, SpO2 97.00%. Physical  Exam  Constitutional: He is oriented to person, place, and time. He appears well-developed and well-nourished.  HENT:  Head: Normocephalic and atraumatic.  Eyes: Conjunctivae and EOM are  normal. Pupils are equal, round, and reactive to light.  Neck: Normal range of motion. Neck supple.  Cardiovascular: Normal rate and normal heart sounds.   Respiratory: Effort normal and breath sounds normal.  GI: Soft. Bowel sounds are normal.  Musculoskeletal: Normal range of motion.  Neurological: He is alert and oriented to person, place, and time. He displays abnormal reflex. No cranial nerve deficit. Coordination normal.       Absent Achilles reflexes bilaterally  Skin: Skin is warm and dry.  Psychiatric: He has a normal mood and affect. His behavior is normal. Judgment and thought content normal.     Assessment/Plan lumbar spinal stenosis Bilateral decompression of lumbar spine at L2-3 and L4-5.  Samera Macy J 02/09/2012, 7:53 AM

## 2012-02-09 NOTE — Anesthesia Preprocedure Evaluation (Addendum)
Anesthesia Evaluation  Patient identified by MRN, date of birth, ID band Patient awake    Reviewed: Allergy & Precautions, H&P , NPO status , Patient's Chart, lab work & pertinent test results  Airway Mallampati: I TM Distance: >3 FB Neck ROM: full    Dental  (+) Teeth Intact   Pulmonary          Cardiovascular hypertension, Pt. on medications Rhythm:regular Rate:Normal     Neuro/Psych    GI/Hepatic   Endo/Other    Renal/GU      Musculoskeletal   Abdominal   Peds  Hematology   Anesthesia Other Findings   Reproductive/Obstetrics                          Anesthesia Physical Anesthesia Plan  ASA: II  Anesthesia Plan: General   Post-op Pain Management:    Induction: Intravenous  Airway Management Planned: Oral ETT  Additional Equipment:   Intra-op Plan:   Post-operative Plan: Extubation in OR  Informed Consent: I have reviewed the patients History and Physical, chart, labs and discussed the procedure including the risks, benefits and alternatives for the proposed anesthesia with the patient or authorized representative who has indicated his/her understanding and acceptance.     Plan Discussed with: CRNA, Anesthesiologist and Surgeon  Anesthesia Plan Comments:         Anesthesia Quick Evaluation

## 2012-02-10 ENCOUNTER — Encounter (HOSPITAL_COMMUNITY): Payer: Self-pay | Admitting: Neurological Surgery

## 2012-02-10 LAB — CBC
HCT: 31.4 % — ABNORMAL LOW (ref 39.0–52.0)
Hemoglobin: 10.3 g/dL — ABNORMAL LOW (ref 13.0–17.0)
MCH: 27.1 pg (ref 26.0–34.0)
MCV: 82.6 fL (ref 78.0–100.0)
RBC: 3.8 MIL/uL — ABNORMAL LOW (ref 4.22–5.81)
WBC: 16.3 10*3/uL — ABNORMAL HIGH (ref 4.0–10.5)

## 2012-02-10 MED ORDER — OXYCODONE-ACETAMINOPHEN 5-325 MG PO TABS: 1.0000 | ORAL_TABLET | ORAL | Status: AC | PRN

## 2012-02-10 MED ORDER — DIAZEPAM 5 MG PO TABS: 5.0000 mg | ORAL_TABLET | Freq: Four times a day (QID) | ORAL | Status: AC | PRN

## 2012-02-10 NOTE — Discharge Summary (Signed)
Physician Discharge Summary  Patient ID: [name] MRN: [id number] DOB/AGE: 1946-07-01 66 y.o.  Admit date: 02/09/2012 Discharge date: 02/10/2012  Admission Diagnoses: Lumbar spondylosis and stenosis with radiculopathy, neurogenic claudication  Discharge Diagnoses: Lumbar spondylosis and stenosis with radiculopathy, neurogenic claudication  Active Problems:  * No active hospital problems. *    Discharged Condition: good  Hospital Course: Patient was admitted to undergo surgical decompression at L2-3 and L4-5 this was performed successfully Bryan Frey feels improved and is discharged home  Consults: None  Significant Diagnostic Studies: None  Treatments: surgery: Bilateral laminotomies and foraminotomies L2-3 and L. or L5 the operating microscope microdissection technique  Discharge Exam: Blood pressure 117/52, pulse 90, temperature 98.2 F (36.8 C), temperature source Oral, resp. rate 16, SpO2 97.00%. Alert oriented ambulating without difficulty station and gait appear normal motor strength is good in lower extremities. Midline incision and lumbar spine minimal bleedthrough.  Disposition:   Discharge Orders    Future Appointments: Provider: Department: Dept Phone: Center:   02/24/2012 9:00 AM [facility] [phone] [facility]     Future Orders Please Complete By Expires   Diet - low sodium heart healthy      Increase activity slowly      Discharge instructions      Comments:   Sit straight walk straight stand straight mind your posture. Okay to shower. Do not apply salves or ointments to incision. A new dressing is necessary until incision remains dry.   Call MD for:  redness, tenderness, or signs of infection (pain, swelling, redness, odor or green/yellow discharge around incision site)      Call MD for:  severe uncontrolled pain      Call MD for:  temperature >100.4        Medication List  As of 02/10/2012  2:57 PM   TAKE these medications        amLODipine 10 MG tablet   Commonly known as: NORVASC   Take 10 mg by mouth daily.      aspirin EC 81 MG tablet   Take 81 mg by mouth daily.      atorvastatin 10 MG tablet   Commonly known as: LIPITOR   Take 10 mg by mouth at bedtime.      cholecalciferol 1000 UNITS tablet   Commonly known as: VITAMIN D   Take 1,000 Units by mouth daily.      diazepam 5 MG tablet   Commonly known as: VALIUM   Take 1 tablet (5 mg total) by mouth every 6 (six) hours as needed for anxiety.      fish oil-omega-3 fatty acids 1000 MG capsule   Take 1 g by mouth 2 (two) times daily.      folic acid 400 MCG tablet   Commonly known as: FOLVITE   Take 400 mcg by mouth daily.      hydrochlorothiazide 25 MG tablet   Commonly known as: HYDRODIURIL   Take 25 mg by mouth daily.      mulitivitamin with minerals Tabs   Take 1 tablet by mouth daily.      oxyCODONE-acetaminophen 5-325 MG per tablet   Commonly known as: PERCOCET   Take 1-2 tablets by mouth every 4 (four) hours as needed for pain.      Tamsulosin HCl 0.4 MG Caps   Commonly known as: FLOMAX   Take 0.4 mg by mouth daily.      traMADol 50 MG tablet   Commonly known as: Janean Sark  Take 100 mg by mouth 2 (two) times daily as needed. For pain      VITAMIN B 12 PO   Take 1,000 mg by mouth daily.             SignedStefani Dama 02/10/2012, 2:57 PM

## 2012-02-10 NOTE — Progress Notes (Signed)
Pt. Alert and oriented,follows simple instructions, denies pain. Incision area without swelling, redness or S/S of infection. Voiding adequate clear yellow urine. Moving all extremities well and vitals stable and documented. Pt. Discharged as ordered and scripts given to patient. Lumbar surgery notes instructions given to patient and family member for home safety and precautions.Pt and family stated understanding of instructions given.

## 2012-02-10 NOTE — Evaluation (Signed)
Physical Therapy Evaluation Patient Details Name: Bryan Frey MRN: [id number] DOB: 09-13-1946 Today's Date: 02/10/2012 Time: 4098-1191 PT Time Calculation (min): 25 min  PT Assessment / Plan / Recommendation Clinical Impression  Pt is 66 y/o male admitted for L2-3 L4-5 decompression.  Pt moving well and completed all mobility with mod (I).  Pt has no further acute PT needs.      PT Assessment  Patent does not need any further PT services;All further PT needs can be met in the next venue of care    Follow Up Recommendations  Outpatient PT (When MD believes is appropriate recommend OPPT.)    Equipment Recommendations  3 in 1 bedside comode (Plans to get from friends/family)    Frequency      Precautions / Restrictions Precautions Precautions: Back Precaution Booklet Issued: Yes (comment) Precaution Comments: Back handout given Restrictions Weight Bearing Restrictions: No   Pertinent Vitals/Pain 5/10 back pain      Mobility  Bed Mobility Bed Mobility: Rolling Right;Right Sidelying to Sit Rolling Right: 6: Modified independent (Device/Increase time);With rail Right Sidelying to Sit: 6: Modified independent (Device/Increase time) Transfers Transfers: Sit to Stand;Stand to Sit Sit to Stand: 6: Modified independent (Device/Increase time);From bed;From elevated surface Stand to Sit: 6: Modified independent (Device/Increase time);To bed Ambulation/Gait Ambulation/Gait Assistance: 6: Modified independent (Device/Increase time) Ambulation Distance (Feet): 200 Feet Assistive device: Straight cane Ambulation/Gait Assistance Details: Pt able to ambulate with mod (I) using SPC with noticeable decrease step length on left LE.  However pt reports left leg length descripency. Gait Pattern: Decreased step length - left;Decreased stance time - right;Decreased hip/knee flexion - left;Decreased trunk rotation Stairs: Yes Stairs Assistance: 6: Modified independent (Device/Increase  time) Stair Management Technique: One rail Left;Forwards;With cane Number of Stairs: 3     Exercises     PT Goals    Visit Information  Last PT Received On: 02/10/12 Assistance Needed: +1    Subjective Data  Subjective: "I hope to walk without the cane eventually." Patient Stated Goal: To do my yardwork again.   Prior Functioning  Home Living Lives With: Spouse Available Help at Discharge: Family Type of Home: House Home Access: Stairs to enter Secretary/administrator of Steps: 2 Entrance Stairs-Rails: None Home Layout: One level Bathroom Shower/Tub: Forensic scientist: Standard Bathroom Accessibility: Yes How Accessible: Accessible via walker Home Adaptive Equipment: Straight cane (Pt stated he can get any equipment  from friends.) Additional Comments: Recommended 3n1 at d/c Prior Function Level of Independence: Independent with assistive device(s) Able to Take Stairs?: Yes Driving: Yes Vocation: Retired Musician: No difficulties Dominant Hand: Right    Cognition  Overall Cognitive Status: Appears within functional limits for tasks assessed/performed Arousal/Alertness: Awake/alert Orientation Level: Appears intact for tasks assessed Behavior During Session: [facility] for tasks performed    Extremity/Trunk Assessment Right Lower Extremity Assessment RLE ROM/Strength/Tone: Within functional levels RLE Sensation: WFL - Light Touch Left Lower Extremity Assessment LLE ROM/Strength/Tone: Within functional levels LLE Sensation: WFL - Light Touch Trunk Assessment Trunk Assessment: Normal   Balance    End of Session PT - End of Session Equipment Utilized During Treatment: Gait belt Activity Tolerance: Patient tolerated treatment well Patient left: in bed;with call bell/phone within reach Nurse Communication: Mobility status;Precautions   Marbeth Smedley 02/10/2012, 8:34 AM Jake Shark, PT DPT [phone]

## 2012-02-10 NOTE — Progress Notes (Signed)
UR COMPLETED  

## 2012-02-11 ENCOUNTER — Other Ambulatory Visit: Payer: Medicare Other

## 2012-02-24 ENCOUNTER — Ambulatory Visit
Admission: RE | Admit: 2012-02-24 | Discharge: 2012-02-24 | Disposition: A | Discharge status: Home / Self Care | Payer: Medicare Other | Source: Ambulatory Visit | Attending: Urology | Admitting: Urology

## 2012-02-24 DIAGNOSIS — C642 Malignant neoplasm of left kidney, except renal pelvis: Secondary | ICD-10-CM

## 2012-02-27 ENCOUNTER — Other Ambulatory Visit (HOSPITAL_COMMUNITY): Payer: Self-pay | Admitting: Interventional Radiology

## 2012-03-08 ENCOUNTER — Encounter (HOSPITAL_COMMUNITY): Payer: Self-pay | Admitting: Pharmacy Technician

## 2012-03-15 ENCOUNTER — Encounter (HOSPITAL_COMMUNITY): Payer: Self-pay

## 2012-03-15 ENCOUNTER — Other Ambulatory Visit: Payer: Self-pay | Admitting: Radiology

## 2012-03-15 ENCOUNTER — Encounter (HOSPITAL_COMMUNITY)
Admission: RE | Admit: 2012-03-15 | Discharge: 2012-03-15 | Disposition: A | Discharge status: Home / Self Care | Payer: Medicare Other | Source: Ambulatory Visit | Attending: Interventional Radiology | Admitting: Interventional Radiology

## 2012-03-15 HISTORY — DX: Anxiety disorder, unspecified: F41.9

## 2012-03-15 HISTORY — DX: Calculus of gallbladder without cholecystitis without obstruction: K80.20

## 2012-03-15 LAB — BASIC METABOLIC PANEL
CO2: 28 mEq/L (ref 19–32)
Chloride: 101 mEq/L (ref 96–112)
Glucose, Bld: 105 mg/dL — ABNORMAL HIGH (ref 70–99)
Sodium: 140 mEq/L (ref 135–145)

## 2012-03-15 LAB — CBC
HCT: 37.6 % — ABNORMAL LOW (ref 39.0–52.0)
MCH: 26.4 pg (ref 26.0–34.0)
MCV: 84.9 fL (ref 78.0–100.0)
RBC: 4.43 MIL/uL (ref 4.22–5.81)
WBC: 9.9 10*3/uL (ref 4.0–10.5)

## 2012-03-15 LAB — APTT: aPTT: 30 seconds (ref 24–37)

## 2012-03-15 LAB — ABO/RH: ABO/RH(D): O NEG

## 2012-03-15 NOTE — Patient Instructions (Addendum)
20 ALDRIC WENZLER  03/15/2012   Your procedure is scheduled on:  03/19/12      Procedure 1200-1500   FRIDAY  Report to [facility]   0830  Call this number if you have problems the morning of surgery: [phone] ask for radiology     Or PST   [id number]  Clifford Benninger   Remember:   Do not eat food  Or drink any fluids :After Midnight. Thursday NIGHT  :    Take these medicines the morning of surgery with A SIP OF WATER:  NORVASC.                 Tramadol if needed   Do not wear jewelry, make-up or nail polish.  Do not wear lotions, powders, or perfumes. You may wear deodorant.  Do not shave 48 hours prior to surgery.  Do not bring valuables to the hospital.  Contacts, dentures or bridgework may not be worn into surgery.  Leave suitcase in the car. After surgery it may be brought to your room.  For patients admitted to the hospital, checkout time is 11:00 AM the day of discharge.   Patients discharged the day of surgery will not be allowed to drive home.  Name and phone number of your driver:   wife                                                                   Special Instructions: CHG Shower Use Special Wash: 1/2 bottle night before surgery and 1/2 bottle morning of surgery. REGULAR SOAP FACE AND PRIVATES                        MEN-MAY SHAVE FACE MORNING OF SURGERY

## 2012-03-19 ENCOUNTER — Ambulatory Visit (HOSPITAL_COMMUNITY): Payer: Medicare Other | Admitting: Anesthesiology

## 2012-03-19 ENCOUNTER — Encounter (HOSPITAL_COMMUNITY): Payer: Self-pay | Admitting: Anesthesiology

## 2012-03-19 ENCOUNTER — Ambulatory Visit (HOSPITAL_COMMUNITY)
Admission: RE | Admit: 2012-03-19 | Discharge: 2012-03-19 | Disposition: A | Discharge status: Home / Self Care | Payer: Medicare Other | Source: Ambulatory Visit | Attending: Interventional Radiology | Admitting: Interventional Radiology

## 2012-03-19 ENCOUNTER — Encounter (HOSPITAL_COMMUNITY): Payer: Self-pay | Admitting: *Deleted

## 2012-03-19 ENCOUNTER — Encounter (HOSPITAL_COMMUNITY)
Admission: RE | Disposition: A | Discharge status: Home / Self Care | Payer: Self-pay | Source: Ambulatory Visit | Attending: Interventional Radiology

## 2012-03-19 ENCOUNTER — Observation Stay (HOSPITAL_COMMUNITY)
Admission: RE | Admit: 2012-03-19 | Discharge: 2012-03-20 | Disposition: A | Discharge status: Home / Self Care | Payer: Medicare Other | Source: Ambulatory Visit | Attending: Interventional Radiology | Admitting: Interventional Radiology

## 2012-03-19 DIAGNOSIS — I10 Essential (primary) hypertension: Secondary | ICD-10-CM | POA: Insufficient documentation

## 2012-03-19 DIAGNOSIS — E7800 Pure hypercholesterolemia, unspecified: Secondary | ICD-10-CM

## 2012-03-19 DIAGNOSIS — Z96649 Presence of unspecified artificial hip joint: Secondary | ICD-10-CM | POA: Insufficient documentation

## 2012-03-19 DIAGNOSIS — Z01812 Encounter for preprocedural laboratory examination: Secondary | ICD-10-CM | POA: Insufficient documentation

## 2012-03-19 DIAGNOSIS — C649 Malignant neoplasm of unspecified kidney, except renal pelvis: Principal | ICD-10-CM | POA: Insufficient documentation

## 2012-03-19 DIAGNOSIS — N200 Calculus of kidney: Secondary | ICD-10-CM

## 2012-03-19 DIAGNOSIS — Z7982 Long term (current) use of aspirin: Secondary | ICD-10-CM | POA: Insufficient documentation

## 2012-03-19 DIAGNOSIS — Z79899 Other long term (current) drug therapy: Secondary | ICD-10-CM | POA: Insufficient documentation

## 2012-03-19 LAB — TYPE AND SCREEN
ABO/RH(D): O NEG
Antibody Screen: NEGATIVE

## 2012-03-19 SURGERY — RADIO FREQUENCY ABLATION
Anesthesia: General | Laterality: Left | Wound class: Clean

## 2012-03-19 MED ORDER — FENTANYL CITRATE 0.05 MG/ML IJ SOLN
INTRAMUSCULAR | Status: DC | PRN
  Administered 2012-03-19 (×2): 50 ug via INTRAVENOUS
  Administered 2012-03-19: 100 ug via INTRAVENOUS
  Administered 2012-03-19: 50 ug via INTRAVENOUS

## 2012-03-19 MED ORDER — AMLODIPINE BESYLATE 10 MG PO TABS
10.0000 mg | ORAL_TABLET | Freq: Every day | ORAL | Status: DC
  Filled 2012-03-19: qty 1

## 2012-03-19 MED ORDER — SENNOSIDES-DOCUSATE SODIUM 8.6-50 MG PO TABS
1.0000 | ORAL_TABLET | Freq: Every day | ORAL | Status: DC | PRN
  Filled 2012-03-19: qty 1

## 2012-03-19 MED ORDER — VITAMINS A & D EX OINT
TOPICAL_OINTMENT | CUTANEOUS | Status: AC
  Filled 2012-03-19: qty 5

## 2012-03-19 MED ORDER — MEPERIDINE HCL 50 MG/ML IJ SOLN: 6.2500 mg | INTRAMUSCULAR | Status: DC | PRN

## 2012-03-19 MED ORDER — HYDROCHLOROTHIAZIDE 25 MG PO TABS
25.0000 mg | ORAL_TABLET | Freq: Once | ORAL | Status: AC
  Administered 2012-03-19: 25 mg via ORAL
  Filled 2012-03-19: qty 1

## 2012-03-19 MED ORDER — HYDROCHLOROTHIAZIDE 25 MG PO TABS
25.0000 mg | ORAL_TABLET | Freq: Every day | ORAL | Status: DC
  Filled 2012-03-19: qty 1

## 2012-03-19 MED ORDER — CEFAZOLIN SODIUM-DEXTROSE 2-3 GM-% IV SOLR
INTRAVENOUS | Status: AC
  Filled 2012-03-19: qty 50

## 2012-03-19 MED ORDER — DOCUSATE SODIUM 100 MG PO CAPS
100.0000 mg | ORAL_CAPSULE | Freq: Two times a day (BID) | ORAL | Status: DC
  Filled 2012-03-19 (×3): qty 1

## 2012-03-19 MED ORDER — SUCCINYLCHOLINE CHLORIDE 20 MG/ML IJ SOLN
INTRAMUSCULAR | Status: DC | PRN
  Administered 2012-03-19: 100 mg via INTRAVENOUS

## 2012-03-19 MED ORDER — ATORVASTATIN CALCIUM 10 MG PO TABS
10.0000 mg | ORAL_TABLET | Freq: Every day | ORAL | Status: DC
  Filled 2012-03-19: qty 1

## 2012-03-19 MED ORDER — HYDROMORPHONE HCL PF 1 MG/ML IJ SOLN
INTRAMUSCULAR | Status: AC
  Filled 2012-03-19: qty 1

## 2012-03-19 MED ORDER — PROMETHAZINE HCL 25 MG/ML IJ SOLN: 6.2500 mg | INTRAMUSCULAR | Status: DC | PRN

## 2012-03-19 MED ORDER — ONDANSETRON HCL 4 MG/2ML IJ SOLN
INTRAMUSCULAR | Status: DC | PRN
  Administered 2012-03-19: 4 mg via INTRAVENOUS

## 2012-03-19 MED ORDER — TAMSULOSIN HCL 0.4 MG PO CAPS
0.4000 mg | ORAL_CAPSULE | Freq: Once | ORAL | Status: AC
  Administered 2012-03-19: 0.4 mg via ORAL
  Filled 2012-03-19: qty 1

## 2012-03-19 MED ORDER — HYDROMORPHONE HCL PF 1 MG/ML IJ SOLN
0.5000 mg | INTRAMUSCULAR | Status: DC | PRN
  Administered 2012-03-19 (×2): 0.5 mg via INTRAVENOUS

## 2012-03-19 MED ORDER — LACTATED RINGERS IV SOLN: INTRAVENOUS | Status: DC

## 2012-03-19 MED ORDER — CEFAZOLIN SODIUM-DEXTROSE 2-3 GM-% IV SOLR
2.0000 g | INTRAVENOUS | Status: AC
  Administered 2012-03-19: 2 g via INTRAVENOUS

## 2012-03-19 MED ORDER — ONDANSETRON HCL 4 MG/2ML IJ SOLN
4.0000 mg | Freq: Four times a day (QID) | INTRAMUSCULAR | Status: DC | PRN
  Administered 2012-03-19: 4 mg via INTRAVENOUS
  Filled 2012-03-19: qty 2

## 2012-03-19 MED ORDER — TRAMADOL HCL 50 MG PO TABS
100.0000 mg | ORAL_TABLET | Freq: Two times a day (BID) | ORAL | Status: DC | PRN
  Filled 2012-03-19: qty 2

## 2012-03-19 MED ORDER — CISATRACURIUM BESYLATE (PF) 10 MG/5ML IV SOLN
INTRAVENOUS | Status: DC | PRN
  Administered 2012-03-19 (×2): 2 mg via INTRAVENOUS
  Administered 2012-03-19: 6 mg via INTRAVENOUS

## 2012-03-19 MED ORDER — LACTATED RINGERS IV SOLN
INTRAVENOUS | Status: DC
  Administered 2012-03-19: 11:00:00 via INTRAVENOUS

## 2012-03-19 MED ORDER — GLYCOPYRROLATE 0.2 MG/ML IJ SOLN
INTRAMUSCULAR | Status: DC | PRN
  Administered 2012-03-19: .5 mg via INTRAVENOUS

## 2012-03-19 MED ORDER — MIDAZOLAM HCL 5 MG/5ML IJ SOLN
INTRAMUSCULAR | Status: DC | PRN
  Administered 2012-03-19: 2 mg via INTRAVENOUS

## 2012-03-19 MED ORDER — PROPOFOL 10 MG/ML IV BOLUS
INTRAVENOUS | Status: DC | PRN
  Administered 2012-03-19: 150 mg via INTRAVENOUS

## 2012-03-19 MED ORDER — NEOSTIGMINE METHYLSULFATE 1 MG/ML IJ SOLN
INTRAMUSCULAR | Status: DC | PRN
  Administered 2012-03-19: 4 mg via INTRAVENOUS

## 2012-03-19 MED ORDER — FENTANYL CITRATE 0.05 MG/ML IJ SOLN
INTRAMUSCULAR | Status: AC
  Filled 2012-03-19: qty 2

## 2012-03-19 MED ORDER — SODIUM CHLORIDE 0.45 % IV SOLN
INTRAVENOUS | Status: DC
  Administered 2012-03-19 – 2012-03-20 (×2): via INTRAVENOUS

## 2012-03-19 MED ORDER — HYDROCODONE-ACETAMINOPHEN 5-325 MG PO TABS
1.0000 | ORAL_TABLET | ORAL | Status: DC | PRN
  Administered 2012-03-19: 2 via ORAL
  Filled 2012-03-19: qty 2

## 2012-03-19 MED ORDER — TAMSULOSIN HCL 0.4 MG PO CAPS
0.4000 mg | ORAL_CAPSULE | Freq: Every day | ORAL | Status: DC
  Filled 2012-03-19: qty 1

## 2012-03-19 MED ORDER — FENTANYL CITRATE 0.05 MG/ML IJ SOLN
25.0000 ug | INTRAMUSCULAR | Status: DC | PRN
  Administered 2012-03-19 (×2): 50 ug via INTRAVENOUS

## 2012-03-19 NOTE — Anesthesia Postprocedure Evaluation (Signed)
  Anesthesia Post-op Note  Patient: Bryan Frey  Procedure(s) Performed: Procedure(s) (LRB): RADIO FREQUENCY ABLATION (Left)  Patient Location: PACU  Anesthesia Type: General  Level of Consciousness: awake and alert   Airway and Oxygen Therapy: Patient Spontanous Breathing  Post-op Pain: mild  Post-op Assessment: Post-op Vital signs reviewed, Patient's Cardiovascular Status Stable, Respiratory Function Stable, Patent Airway and No signs of Nausea or vomiting  Post-op Vital Signs: stable  Complications: No apparent anesthesia complications

## 2012-03-19 NOTE — H&P (Signed)
Agree.  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

## 2012-03-19 NOTE — Procedures (Signed)
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  4 Galil Ice Rod Plus probes placed in array within tumor. Full note dictated. Plan:  Overnight observation.

## 2012-03-19 NOTE — Transfer of Care (Signed)
Immediate Anesthesia Transfer of Care Note  Patient: Bryan Frey  Procedure(s) Performed: Procedure(s) (LRB): RADIO FREQUENCY ABLATION (Left)  Patient Location: PACU  Anesthesia Type: General  Level of Consciousness: awake, alert , oriented and patient cooperative  Airway & Oxygen Therapy: Patient Spontanous Breathing and Patient connected to face mask oxygen  Post-op Assessment: Report given to PACU RN and Post -op Vital signs reviewed and stable  Post vital signs: Reviewed and stable  Complications: No apparent anesthesia complications

## 2012-03-19 NOTE — H&P (Signed)
Bryan Frey is an 66 y.o. male.   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  Past Medical History  Diagnosis Date  . Nephrolithiasis   . High cholesterol   . 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  . 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 Hypertension     EKG,  chest  4/13 EPIC  . Anxiety     with diagnosis  . Gall stones     Past Surgical History  Procedure Date  . Joint replacement 2011    Lt hip  . Lumbar laminectomy/decompression microdiscectomy 02/09/2012    Procedure: LUMBAR LAMINECTOMY/DECOMPRESSION MICRODISCECTOMY 2 LEVELS;  Surgeon: Barnett Abu, MD;  Location: [facility];  Service: Neurosurgery;  Laterality: Bilateral;  Bilateral Lumbar two-three,lumbar four-five laminectomies    Family History  Problem Relation Age of Onset  . Anesthesia problems Neg Hx    Social History: married, 2 children, lives in [place], denies alcohol, tobacco use; not employed  Allergies: No Known Allergies  No current facility-administered medications for this encounter. Current outpatient prescriptions:amLODipine (NORVASC) 10 MG tablet, Take 10 mg by mouth daily., Disp: , Rfl: ;  aspirin EC 81 MG tablet, Take 81 mg by mouth daily., Disp: , Rfl: ;  atorvastatin (LIPITOR) 10 MG tablet, Take 10 mg by mouth at bedtime. , Disp: , Rfl: ;  cholecalciferol (VITAMIN D) 1000 UNITS tablet, Take 1,000 Units by mouth daily., Disp: , Rfl: ;  Cyanocobalamin (VITAMIN B 12 PO), Take 1,000 mg by mouth daily., Disp: , Rfl:  fish oil-omega-3 fatty acids 1000 MG capsule, Take 1 g by mouth daily. , Disp: , Rfl: ;  folic acid (FOLVITE) 400 MCG tablet, Take 400 mcg by mouth daily., Disp: , Rfl: ;  hydrochlorothiazide (HYDRODIURIL) 25 MG tablet, Take 25 mg by mouth daily., Disp: , Rfl: ;  Multiple Vitamin (MULITIVITAMIN WITH MINERALS) TABS, Take 1 tablet by mouth daily., Disp: , Rfl: ;  Tamsulosin HCl  (FLOMAX) 0.4 MG CAPS, Take 0.4 mg by mouth daily., Disp: , Rfl:  traMADol (ULTRAM) 50 MG tablet, Take 100 mg by mouth 2 (two) times daily as needed. For pain, Disp: , Rfl:  Facility-Administered Medications Ordered in Other Encounters: ceFAZolin (ANCEF) 2-3 GM-% IVPB SOLR, , , , ;  ceFAZolin (ANCEF) IVPB 2 g/50 mL premix, 2 g, Intravenous, On Call, D Kevin Gaynor Ferreras, PA;  lactated ringers infusion, , Intravenous, Continuous, D Jeananne Rama, PA  Results for orders placed during the hospital encounter of 03/15/12  APTT      Component Value Range   aPTT 30  24 - 37 seconds  BASIC METABOLIC PANEL      Component Value Range   Sodium 140  135 - 145 mEq/L   Potassium 4.3  3.5 - 5.1 mEq/L   Chloride 101  96 - 112 mEq/L   CO2 28  19 - 32 mEq/L   Glucose, Bld 105 (*) 70 - 99 mg/dL   BUN 15  6 - 23 mg/dL   Creatinine, Ser 4.09  0.50 - 1.35 mg/dL   Calcium 81.1  8.4 - 91.4 mg/dL   GFR calc non Af Amer 84 (*) >90 mL/min   GFR calc Af Amer >90  >90 mL/min  CBC      Component Value Range   WBC 9.9  4.0 - 10.5 K/uL   RBC 4.43  4.22 -  5.81 MIL/uL   Hemoglobin 11.7 (*) 13.0 - 17.0 g/dL   HCT 40.9 (*) 81.1 - 91.4 %   MCV 84.9  78.0 - 100.0 fL   MCH 26.4  26.0 - 34.0 pg   MCHC 31.1  30.0 - 36.0 g/dL   RDW 78.2  95.6 - 21.3 %   Platelets 323  150 - 400 K/uL  PROTIME-INR      Component Value Range   Prothrombin Time 13.7  11.6 - 15.2 seconds   INR 1.03  0.00 - 1.49  TYPE AND SCREEN      Component Value Range   ABO/RH(D) O NEG     Antibody Screen NEG     Sample Expiration 03/22/2012    ABO/RH      Component Value Range   ABO/RH(D) O NEG        Review of Systems  Constitutional: Negative for fever and chills.  HENT:       Occ ringing in ears  Eyes: Positive for blurred vision.  Respiratory: Negative for cough and shortness of breath.   Cardiovascular: Negative for chest pain.  Gastrointestinal: Negative for nausea, vomiting and abdominal pain.  Genitourinary: Negative for hematuria and  flank pain.       Occ urinary hesistancy/incomplete voiding  Musculoskeletal: Positive for back pain and joint pain.  Neurological: Negative for headaches.  Endo/Heme/Allergies: Does not bruise/bleed easily.    There were no vitals taken for this visit. Physical Exam  Constitutional: He is oriented to person, place, and time. He appears well-developed and well-nourished.  Cardiovascular: Normal rate.   No murmur heard.      occ ectopy noted  Respiratory: Effort normal and breath sounds normal.  GI: Soft. Bowel sounds are normal. There is no tenderness.  Musculoskeletal: He exhibits no edema.  Neurological: He is alert and oriented to person, place, and time.     Assessment/Plan Patient with left lower pole renal cell carcinoma; plan is for CT guided percutaneous cryoablation today followed by overnight observation for hemodynamic monitoring. Details/risks of above d/w pt/wife with their understanding and consent.  Dexton Zwilling,D KEVIN 03/19/2012, 9:54 AM

## 2012-03-19 NOTE — Preoperative (Signed)
Beta Blockers   Reason not to administer Beta Blockers:Not Applicable 

## 2012-03-19 NOTE — Addendum Note (Signed)
Addendum  created 03/19/12 1445 by Phillips Grout, MD   Modules edited:Orders

## 2012-03-19 NOTE — Progress Notes (Signed)
Day of Surgery  Subjective:  Nausea but no vomiting.  No pain.  Urine clear.  Objective: Vital signs in last 24 hours: Temp:  [97.3 F (36.3 C)-98.7 F (37.1 C)] 98 F (36.7 C) (06/14 1535) Pulse Rate:  [60-70] 68  (06/14 1535) Resp:  [10-21] 16  (06/14 1535) BP: (117-145)/(61-79) 120/77 mmHg (06/14 1535) SpO2:  [90 %-100 %] 90 % (06/14 1535) Weight:  [227 lb (102.967 kg)] 227 lb (102.967 kg) (06/14 1535)    Intake/Output from previous day:   Intake/Output this shift: Total I/O In: 300 [I.V.:300] Out: 50 [Blood:50]  Abdomen:  Soft, NT.  No left flank tenderness.  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  The patient now presents for percutaneous cryoablation of the tumor.  CT-GUIDED PERCUTANEOUS CRYOABLATION OF LEFT RENAL CARCINOMA.  Anesthesia:  General  Medications:  2 grams IV Ancef.  As antibiotic prophylaxis, Ancef was ordered pre-procedure and administered intravenously within one hour of incision.  Procedure:  The procedure, risks, benefits, and alternatives were explained to the patient.  Questions regarding the procedure were encouraged and answered.  The patient understands and consents to the procedure.  The patient was placed under general anesthesia.  Initial unenhanced CT was performed in a prone position to localize the left renal tumor.  The left flank region was prepped with Betadine in a sterile fashion, and a sterile drape was applied covering the operative field.   A sterile gown and sterile gloves were used for the procedure.  Under CT guidance, a series of four Galil Ice Rod Plus percutaneous cryoablation probes were advanced into the tumor.  Probe positioning was confirmed by CT prior to cryoablation.  Cryoablation was performed through the four probes simultaneously. Initial 10 minute cycle of cryoablation was performed.  This was followed by a 8 to the minute thaw cycle.  A second 10 minute cycle of cryoablation was then performed.  During ablation, periodic CT imaging was performed to monitor ice ball formation and morphology. After active thaw, the cryoablation probes were removed.  Complications: None  Findings:  The well-circumscribed papillary carcinoma is again visualized emanating from the posterior lower pole cortex of the left kidney.  Maximal tumor diameter is approximately 3.3 cm. After placing four probes in a tandem array, cryoablation was performed.  During the procedure, monitoring with CT shows formation of an ice ball that completely encompasses the tumor. There was no evidence of hemorrhage at the time of the procedure.  IMPRESSION:  CT guided percutaneous cryoablation of left renal carcinoma.  The patient will be observed overnight.  Initial follow-up will be performed in approximately 4 weeks.  Original Report Authenticated By: Reola Calkins, M.D.    Anti-infectives: Anti-infectives    None      Assessment/Plan: s/p Procedure(s) (LRB): RADIO FREQUENCY ABLATION (Left)  Status post cryoablation of left renal papillary renal cell CA.  Doing well.  Check labs in AM.  Clinic follow up in 4 weeks.    LOS: 0 days    Bryan Frey T 03/19/2012

## 2012-03-19 NOTE — Anesthesia Preprocedure Evaluation (Addendum)
Anesthesia Evaluation  Patient identified by MRN, date of birth, ID band Patient awake    Reviewed: Allergy & Precautions, H&P , NPO status , Patient's Chart, lab work & pertinent test results  Airway Mallampati: II TM Distance: >3 FB Neck ROM: Full    Dental No notable dental hx. (+) Teeth Intact   Pulmonary neg pulmonary ROS,  breath sounds clear to auscultation  Pulmonary exam normal       Cardiovascular hypertension, Pt. on medications negative cardio ROS  Rhythm:Regular Rate:Normal     Neuro/Psych negative neurological ROS  negative psych ROS   GI/Hepatic negative GI ROS, Neg liver ROS,   Endo/Other  negative endocrine ROS  Renal/GU negative Renal ROS  negative genitourinary   Musculoskeletal negative musculoskeletal ROS (+)   Abdominal (+) + obese,   Peds negative pediatric ROS (+)  Hematology negative hematology ROS (+)   Anesthesia Other Findings   Reproductive/Obstetrics negative OB ROS                          Anesthesia Physical Anesthesia Plan  ASA: II  Anesthesia Plan: General   Post-op Pain Management:    Induction: Intravenous  Airway Management Planned: Oral ETT  Additional Equipment:   Intra-op Plan:   Post-operative Plan: Extubation in OR  Informed Consent: I have reviewed the patients History and Physical, chart, labs and discussed the procedure including the risks, benefits and alternatives for the proposed anesthesia with the patient or authorized representative who has indicated his/her understanding and acceptance.   Dental advisory given  Plan Discussed with: CRNA  Anesthesia Plan Comments:         Anesthesia Quick Evaluation                                   Anesthesia Evaluation  Patient identified by MRN, date of birth, ID band Patient awake    Reviewed: Allergy & Precautions, H&P , NPO status , Patient's Chart, lab work & pertinent  test results  Airway Mallampati: I TM Distance: >3 FB Neck ROM: full    Dental  (+) Teeth Intact   Pulmonary          Cardiovascular hypertension, Pt. on medications Rhythm:regular Rate:Normal     Neuro/Psych    GI/Hepatic   Endo/Other    Renal/GU      Musculoskeletal   Abdominal   Peds  Hematology   Anesthesia Other Findings   Reproductive/Obstetrics                          Anesthesia Physical Anesthesia Plan  ASA: II  Anesthesia Plan: General   Post-op Pain Management:    Induction: Intravenous  Airway Management Planned: Oral ETT  Additional Equipment:   Intra-op Plan:   Post-operative Plan: Extubation in OR  Informed Consent: I have reviewed the patients History and Physical, chart, labs and discussed the procedure including the risks, benefits and alternatives for the proposed anesthesia with the patient or authorized representative who has indicated his/her understanding and acceptance.     Plan Discussed with: CRNA, Anesthesiologist and Surgeon  Anesthesia Plan Comments:         Anesthesia Quick Evaluation

## 2012-03-20 LAB — CBC
Hemoglobin: 9.9 g/dL — ABNORMAL LOW (ref 13.0–17.0)
Platelets: 279 10*3/uL (ref 150–400)
RBC: 3.75 MIL/uL — ABNORMAL LOW (ref 4.22–5.81)

## 2012-03-20 LAB — BASIC METABOLIC PANEL
Calcium: 8.7 mg/dL (ref 8.4–10.5)
GFR calc Af Amer: 80 mL/min — ABNORMAL LOW (ref >90)
GFR calc non Af Amer: 69 mL/min — ABNORMAL LOW (ref >90)
Potassium: 3.5 mEq/L (ref 3.5–5.1)
Sodium: 137 mEq/L (ref 135–145)

## 2012-03-20 NOTE — Progress Notes (Signed)
Foley catheter removed after deflating balloon.  Pt. Advised of need to call for assistance when needing to get up for the first time.  Given urinal, told of need to assess ability to pass urine on his own within next six hours. Pt. Voices understanding.  Pt. Given PO pain medication for complaint of chronic right leg pain.  Ronaald E. Lonia Mad, RN

## 2012-03-20 NOTE — Discharge Summary (Signed)
Physician Discharge Summary  Patient ID: Bryan Frey MRN: [id number] DOB/AGE: 12-03-1945 66 y.o.  Admit date: 03/19/2012 Discharge date: 03/20/2012  Admission Diagnoses: Left lower pole renal papillary carcinoma  Discharge Diagnoses: Left lower pole renal papillary carcinoma, status post CT guided percutaneous cryoablation on 03/19/2012 Secondary diagnoses: Hypertension, hypercholesterolemia, nephrolithiasis,anemia  Discharged Condition: good  Hospital Course: Bryan Frey is a 66 year old white male, patient of Bryan Frey, who was referred to the interventional radiology service for further evaluation and treatment of a left lower pole renal papillary carcinoma measuring approximately 3.3 cm. The patient was seen in the interventional radiology clinic by Bryan Frey on 5/11/07/2011, and on  03/19/2012 the patient underwent CT-guided percutaneous cryoablation of the left lower pole renal papillary carcinoma under general anesthesia without immediate complications. The patient tolerated the procedure well, was extubated without difficulty and  admitted to the hospital for overnight observation for hemodynamic monitoring. Patient did well overnight with exception of some mild intermittent nausea relieved with antiemetics and mild left anterior flank discomfort. There was no hematuria. He tolerated his diet well and was able to void. He ambulated without difficulty, using a cane to assist as he is s/p recent lumbar decompressive surgery. His vital signs were stable. He was afebrile. There was a mild drop in hemoglobin post procedure, possibly dilutional vs procedure related . Renal function was normal . The patient will be discharged home with followup visit with Bryan Frey scheduled in approximately 4 weeks in the interventional radiology clinic. Results for orders placed during the hospital encounter of 03/19/12  BASIC METABOLIC PANEL      Component Value Range   Sodium 137  135 - 145  mEq/L   Potassium 3.5  3.5 - 5.1 mEq/L   Chloride 101  96 - 112 mEq/L   CO2 27  19 - 32 mEq/L   Glucose, Bld 130 (*) 70 - 99 mg/dL   BUN 16  6 - 23 mg/dL   Creatinine, Ser 1.47  0.50 - 1.35 mg/dL   Calcium 8.7  8.4 - 82.9 mg/dL   GFR calc non Af Amer 69 (*) >90 mL/min   GFR calc Af Amer 80 (*) >90 mL/min  CBC      Component Value Range   WBC 10.4  4.0 - 10.5 K/uL   RBC 3.75 (*) 4.22 - 5.81 MIL/uL   Hemoglobin 9.9 (*) 13.0 - 17.0 g/dL   HCT 56.2 (*) 13.0 - 86.5 %   MCV 85.1  78.0 - 100.0 fL   MCH 26.4  26.0 - 34.0 pg   MCHC 31.0  30.0 - 36.0 g/dL   RDW 78.4  69.6 - 29.5 %   Platelets 279  150 - 400 K/uL   Ct Guide Tissue Ablation  03/19/2012  *RADIOLOGY REPORT*  Clinical Data:  Left lower pole renal papillary carcinoma confirmed by prior biopsy.  The patient now presents for percutaneous cryoablation of the tumor.  CT-GUIDED PERCUTANEOUS CRYOABLATION OF LEFT RENAL CARCINOMA.  Anesthesia:  General  Medications:  2 grams IV Ancef.  As antibiotic prophylaxis, Ancef was ordered pre-procedure and administered intravenously within one hour of incision.  Procedure:  The procedure, risks, benefits, and alternatives were explained to the patient.  Questions regarding the procedure were encouraged and answered.  The patient understands and consents to the procedure.  The patient was placed under general anesthesia.  Initial unenhanced CT was performed in a prone position to localize the left renal tumor.  The left flank region  was prepped with Betadine in a sterile fashion, and a sterile drape was applied covering the operative field.  A sterile gown and sterile gloves were used for the procedure.  Under CT guidance, a series of four Galil Ice Rod Plus percutaneous cryoablation probes were advanced into the tumor.  Probe positioning was confirmed by CT prior to cryoablation.  Cryoablation was performed through the four probes simultaneously. Initial 10 minute cycle of cryoablation was performed.  This was  followed by a 8 to the minute thaw cycle.  A second 10 minute cycle of cryoablation was then performed.  During ablation, periodic CT imaging was performed to monitor ice ball formation and morphology. After active thaw, the cryoablation probes were removed.  Complications: None  Findings:  The well-circumscribed papillary carcinoma is again visualized emanating from the posterior lower pole cortex of the left kidney.  Maximal tumor diameter is approximately 3.3 cm. After placing four probes in a tandem array, cryoablation was performed.  During the procedure, monitoring with CT shows formation of an ice ball that completely encompasses the tumor. There was no evidence of hemorrhage at the time of the procedure.  IMPRESSION:  CT guided percutaneous cryoablation of left renal carcinoma.  The patient will be observed overnight.  Initial follow-up will be performed in approximately 4 weeks.  Original Report Authenticated By: Bryan Frey, M.D.   [facility] Radiologist Eval [facility] Mgmt  02/25/2012  *RADIOLOGY REPORT*  NEW PATIENT OFFICE VISIT - LEVEL III [phone])  Feb 24, 2012  Bryan Frey, M.D. [facility] [address] [place], [place]  [address]  RE:  Bryan Frey (DOB: [birth year])  Dear Jeannett Senior:  Thank you for sending Bryan Frey for consultation regarding possible use of percutaneous cryoablation to treat a papillary left renal carcinoma.  The patient is a 66 year old male who underwent unenhanced CT of the abdomen for workup of renal calculi at [facility] in February.  This demonstrated the presence of an exophytic left lower pole renal mass which was further characterized by ultrasound, contrast enhanced CT and unenhanced abdominal MRI.  The lesion at the time of initial work up measured approximately 3.2 cm and demonstrated solid tissue with mild contrast enhancement by CT.  The lesion emanated from the posterior lower pole cortex and was predominantly  exophytic.  There was no evidence by imaging of regional metastatic disease.  The patient underwent CT guided biopsy of the left renal mass on 01/16/12 performed by Dr. Richarda Overlie.  Pathology of core biopsy samples demonstrated evidence of papillary renal cell carcinoma, Fuhrman nuclear grade 2.  The patient has been asymptomatic with respect to the renal mass.  He does have bilateral nonobstructing renal calculi which are currently asymptomatic.  He denies any current hematuria, dysuria or flank pain.  The patient recently underwent lumbar decompressive surgery performed by Dr. Barnett Abu on 02/09/12.  This involved lumbar decompression at L2-3 and L4-5 without fusion.  The patient has been recovering from that surgical procedure and states that symptoms of bilateral leg pain have improved.  He is ambulating with a cane.  The patient states that he did have some degree of renal insufficiency in the recent past which has since resolved, allowing him to receive iodinated contrast material for CT.  He does not have a known history of significant chronic kidney disease.  Past Medical History:     1.    Hypertension.  The patient is followed by Dr. Bethann Punches in  [place], [place]. 2.    Hypercholesterolemia. 3.    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  Medications:  Amlodipine besylate 10 mg daily, hydrochlorothiazide 25 mg daily, tamsulosin HCL 0.4 mg daily, tramadol 50 mg two tablets twice daily as needed, atorvastatin 10 mg at bedtime, aspirin 81 mg daily, folic acid 400 mcg daily, vitamin B12 1000 mcg daily, vitamin D 1000 IU daily, fish oil 1000 mg twice daily, multivitamin daily.  Allergies:  No known drug allergies.  Social History:  The patient is married and has two children.  He lives in [place], [place].  He denies alcohol or tobacco use.  He is not employed.  Family History:  Several relatives with history of hypertension and diabetes.  Review of Systems:  General:  Height 5'11", weight 229 lbs.  Ear,  nose, throat:  Prior ringing in ears.  Vision:  History of blurred vision.  Gastrointestinal:  History of painful bowel movements every 4-5 days.  No nausea, vomiting or diarrhea.  Genitourinary: History of sexual difficulty.  The patient has some chronic symptoms of urinary hesitancy and sensation of incomplete voiding. Cardiovascular:  No chest pain or palpitations.  Musculoskeletal: Chronic joint pain and difficulty ambulating.  Respiratory:  No cough or shortness of breath.  Exam:  Vital Signs:  Blood pressure 99/61, pulse 77, temperature 97.9, respirations 16.  General:  No acute distress.  Chest:  Clear to auscultation bilaterally.  Heart:  Regular rate and rhythm.  No audible murmurs.  Abdomen:  Soft and nontender.  No flank tenderness.  Back:  Healed lumbar incision without tenderness. Extremities:  No edema.  Labs:  Post op hemoglobin 10.3 and hematocrit 31.9, platelet count 277 on 02/10/12.  Most recent BMP is on 02/03/12.  Sodium 139, potassium 4.3, BUN 18, creatinine 1.04.  Estimated GFR 72 ml per minute.  Imaging:  Prior imaging was obtained from [facility] on disk.  I reviewed the imaging myself including prior ultrasound, CT studies and MRI of the abdomen.  Imaging at the time of percutaneous biopsy was also reviewed.  Based on my measurements, the renal lesion currently measures approximately 3.5 cm in greatest diameter.  Assessment:  I met with Bryan Frey and his wife.  We reviewed imaging findings and discussed treatment options for the left renal papillary carcinoma including surgical resection and percutaneous cryoablation.  With regard to ablation, the lesion is in a good location for ablation given its posterior and exophytic growth pattern.  The lesion is also of a size amenable to percutaneous cryoablation.  Pro and cons of ablation versus surgical resection were discussed with the patient and he has also had a discussion with you regarding options.  Details of cryoablation  were discussed with the patient including risks.  The procedure would involve overnight hospital stay.  The patient does have a history of reported renal insufficiency of which I am not aware of the severity.  This may have been related to relative volume depletion.  Given history of renal calculi and prior reported renal insufficiency, there would be an indication for a maximally nephron sparing procedure.  After discussion, the patient is favoring treatment with percutaneous cryoablation.  We will begin any necessary insurance approval process.  Thank you again for sending Bryan Frey for consultation and allowing me to participate in his care.  We will contact him after checking with insurance coverage regarding percutaneous cryoablation.  I will let you know if he decides to proceed with scheduling of  cryoablation.  Sincerely,  Bryan Frey, M.D.  Lavone NianBarnett Abu, M.D.       Bethann Punches, M.D.  Original Report Authenticated By: Bryan Frey, M.D.   Consults: none  Significant Diagnostic Studies: CT scan of the abdomen at time of cryoablation which revealed a well circumscribed papillary carcinoma emanating from the posterior lower pole cortex of left kidney. Maximal tumor diameter is approximately 3.3 cm.   Treatments: 03/19/2012 CT guided percutaneous cryoablation of the left lower pole renal papillary carcinoma under general anesthesia  Discharge Exam: Blood pressure 104/62, pulse 79, temperature 98.7 F (37.1 C), temperature source Oral, resp. rate 16, height 5\' 11"  (1.803 m), weight 227 lb (102.967 kg), SpO2 98.00%. Patient is awake, alert oriented x3. Chest with slightly diminished breath sounds left base. Heart regular rate and rhythm with occasional ectopy. Abdomen soft, positive bowel sounds, mild left upper quadrant tenderness to palpation. Puncture site left flank with intact clean gauze dressing. Visual inspection of the puncture site reveals no active bleeding or evidence of  hematoma. Extremities with no significant edema .  Disposition: home  Discharge Orders    Future Orders Please Complete By Expires   Diet - low sodium heart healthy      Increase activity slowly      May walk up steps      May shower / Bathe      Discharge instructions      Comments:   Avoid heavy lifting for next 2-3 days; stay well hydrated; eat iron rich foods; contact Dr. Delorse Lek or Interventional Radiology at [phone] with any questions or concerns   Driving Restrictions      Comments:   May resume driving on 2/95 if stable   Lifting restrictions      Comments:   Avoid heavy lifting for next 2-3 days   Remove dressing in 24 hours      Scheduling Instructions:   May change bandage on left back region and apply small amount of neosporin to site daily for next 2-3 days   Call MD for:  temperature >100.4      Call MD for:  persistant nausea and vomiting      Call MD for:  severe uncontrolled pain      Call MD for:  redness, tenderness, or signs of infection (pain, swelling, redness, odor or green/yellow discharge around incision site)      Call MD for:  persistant dizziness or light-headedness      Discharge patient        Medication List  As of 03/20/2012  8:53 AM   TAKE these medications         amLODipine 10 MG tablet   Commonly known as: NORVASC   Take 10 mg by mouth daily.      aspirin EC 81 MG tablet   Take 81 mg by mouth daily.      atorvastatin 10 MG tablet   Commonly known as: LIPITOR   Take 10 mg by mouth at bedtime.      cholecalciferol 1000 UNITS tablet   Commonly known as: VITAMIN D   Take 1,000 Units by mouth daily.      fish oil-omega-3 fatty acids 1000 MG capsule   Take 1 g by mouth daily.      folic acid 400 MCG tablet   Commonly known as: FOLVITE   Take 400 mcg by mouth daily.      hydrochlorothiazide 25 MG tablet   Commonly  known as: HYDRODIURIL   Take 25 mg by mouth daily.      multivitamin with minerals Tabs   Take 1  tablet by mouth daily.      Tamsulosin HCl 0.4 MG Caps   Commonly known as: FLOMAX   Take 0.4 mg by mouth daily.      traMADol 50 MG tablet   Commonly known as: ULTRAM   Take 100 mg by mouth 2 (two) times daily as needed. For pain      VITAMIN B 12 PO   Take 1,000 mg by mouth daily.           Follow-up Information    Please follow up. (follow up with Dr. Retta Diones as needed;radiology will call you with follow up appt with Bryan Frey in 4 weeks; call [phone] with any questions)          Signed: Ty Buntrock,D [facility] 03/20/2012, 8:53 AM

## 2012-03-20 NOTE — Discharge Instructions (Signed)
Kidney Biopsy/ ablation A biopsy is a test that involves collecting small pieces of tissue, usually through a needle. The tissue is then examined under a microscope. A kidney biopsy can help find a diagnosis and determine the best course of treatment. Your caregiver may recommend a kidney biopsy if you have any of the following conditions:  Hematuria. This is blood in your urine.   Proteinuria. This is when there is excessive protein in your urine.   Impaired kidney function that causes excessive waste products in your blood.  A specialist will look at the kidney tissue samples to check for unusual deposits, scarring, or infecting organisms that would explain your condition. Your caregiver may discover that you have a condition that can be treated and cured. If you have progressive kidney failure, the biopsy may show how quickly the disease is advancing. A biopsy can also help explain why a transplanted kidney is not working properly. Talk with your caregiver about what information might be learned from the biopsy and the risks involved. This can help you make a decision about whether a biopsy is worthwhile in your case. BEFORE THE PROCEDURE  Make sure you understand the need for a biopsy.   Tell your caregiver about any allergies you have and medicines you take.   Avoid food and fluid for 8 hours before the test.   You will have to sign a consent form indicating that you understand the risks involved in this procedure. They are very rare. Discuss these risks thoroughly with your caregiver before you sign the form.   Make sure your caregiver is aware of all the medicines you take and any drug allergies you might have. Shortly before the biopsy, you will give blood and urine samples. This is to make sure you do not have a condition that would suggest not doing a biopsy.  PROCEDURE  Kidney biopsies are usually done in a hospital. You may be fully awake with light sedation or you may be asleep  under general anesthesia. If you are awake, you will be given a local anesthetic before the needle is inserted.   You will lie on your stomach to position the kidneys near the surface of your back. If you have a transplanted kidney, you will lie on your back. The doctor will inject a local painkiller. For a through the skin (percutaneous) biopsy, the doctor will use a locating needle and X-ray or ultrasound equipment to find the right spot and then a collecting needle to gather the tissue. You will be asked to hold your breath as the doctor inserts the biopsy needle and collects the tissue. This is usually for about 30 seconds or a little longer for each insertion. Do not exhale until you are told.   The entire procedure usually takes an hour. This includes time to locate the kidney, clean the biopsy site, inject the local painkiller, and obtain the tissue samples.   Some patients should not have a percutaneous biopsy if they are prone to bleeding problems. These patients may still undergo a kidney biopsy through an open operation. This is when the surgeon makes an incision and can see the kidney to obtain a biopsy.  AFTER THE PROCEDURE  You will lie on your back for 12 to 24 hours. During this time, your back will probably feel sore. If you have a transplanted kidney, you will lie on your stomach. You may stay in the hospital overnight after the procedure so that staff can check your  condition. You may notice some blood in your urine for 24 hours after the test. To detect any problems, your caregivers will:   Monitor your blood pressure and pulse.   Take blood samples to measure the amount of red cells.   Examine the urine that you pass.   On rare occasions when bleeding does not stop on its own, it may be necessary to replace lost blood with a transfusion.   A rare complication is infection from the biopsy procedure.  SEEK MEDICAL CARE IF:  You have bloody urine more than 24 hours after the  test.   You have a fever.   You feel faint or dizzy .   You cannot urinate .   You have worsening pain in the biopsy.  OBTAINING THE TEST RESULTS It is your responsibility to obtain your test results. Ask the lab or department performing the test when and how you will get your results. FOR MORE INFORMATION  [employer]: FightingMatch.com.ee   [employer]: www.kidney.org   National Kidney and urologic Diseases Information Clearinghouse: http://kidney.https://www.bennett.com/  Document Released: 08/02/2004 Document Revised: 09/11/2011 Document Reviewed: 09/09/2010 [facility] Patient Information 2012 [place], [place].

## 2012-03-22 ENCOUNTER — Other Ambulatory Visit: Payer: Self-pay | Admitting: Interventional Radiology

## 2012-03-22 DIAGNOSIS — N2889 Other specified disorders of kidney and ureter: Secondary | ICD-10-CM

## 2012-03-22 NOTE — Discharge Summary (Signed)
Agree.  OK to discharge.  Clinic follow up in 4 weeks.

## 2012-03-23 ENCOUNTER — Other Ambulatory Visit: Payer: Self-pay | Admitting: Interventional Radiology

## 2012-03-23 DIAGNOSIS — N2889 Other specified disorders of kidney and ureter: Secondary | ICD-10-CM

## 2012-03-29 ENCOUNTER — Other Ambulatory Visit: Payer: Self-pay | Admitting: Emergency Medicine

## 2012-03-29 DIAGNOSIS — C649 Malignant neoplasm of unspecified kidney, except renal pelvis: Secondary | ICD-10-CM

## 2012-04-05 ENCOUNTER — Other Ambulatory Visit: Payer: Self-pay | Admitting: Emergency Medicine

## 2012-04-06 ENCOUNTER — Other Ambulatory Visit: Payer: Self-pay | Admitting: Emergency Medicine

## 2012-04-06 DIAGNOSIS — C649 Malignant neoplasm of unspecified kidney, except renal pelvis: Secondary | ICD-10-CM

## 2012-05-05 ENCOUNTER — Ambulatory Visit (HOSPITAL_COMMUNITY)
Admission: RE | Admit: 2012-05-05 | Discharge: 2012-05-05 | Disposition: A | Discharge status: Home / Self Care | Payer: Medicare Other | Source: Ambulatory Visit | Attending: Interventional Radiology | Admitting: Interventional Radiology

## 2012-05-05 ENCOUNTER — Ambulatory Visit
Admission: RE | Admit: 2012-05-05 | Discharge: 2012-05-05 | Disposition: A | Discharge status: Home / Self Care | Payer: Medicare Other | Source: Ambulatory Visit | Attending: Interventional Radiology | Admitting: Interventional Radiology

## 2012-05-05 DIAGNOSIS — K8020 Calculus of gallbladder without cholecystitis without obstruction: Secondary | ICD-10-CM | POA: Insufficient documentation

## 2012-05-05 DIAGNOSIS — N200 Calculus of kidney: Secondary | ICD-10-CM | POA: Insufficient documentation

## 2012-05-05 DIAGNOSIS — N2889 Other specified disorders of kidney and ureter: Secondary | ICD-10-CM

## 2012-05-05 DIAGNOSIS — Z09 Encounter for follow-up examination after completed treatment for conditions other than malignant neoplasm: Secondary | ICD-10-CM | POA: Insufficient documentation

## 2012-05-05 MED ORDER — IOHEXOL 300 MG/ML  SOLN
100.0000 mL | Freq: Once | INTRAMUSCULAR | Status: AC | PRN
  Administered 2012-05-05: 100 mL via INTRAVENOUS

## 2012-05-05 NOTE — Progress Notes (Signed)
Denies hematuria or problems with urination.     States that he experienced minimal-moderate Left flank tenderness for about 5 days post procedure.  Has not been bothered by that since.  Continues to experience some fatigue.

## 2012-05-05 NOTE — Progress Notes (Signed)
Patient ID: Bryan Frey, male   DOB: [birth year], 66 y.o.   MRN: [id number]  ESTABLISHED PATIENT OFFICE VISIT  Chief Complaint: Status post percutaneous cryoablation of a biopsy proven papillary carcinoma of the left kidney on 03/19/2012.  History: Mr. Ermis returns for follow-up. After the procedure he had approximately 4 to 5 days of left-sided flank tenderness which has now resolved. The patient's only current complaint is some residual fatigue and weakness. He is gradually trying to increase activity level.  Review of Systems: The patient denies hematuria, dysuria or abnormal urination. He denies fever or chills. He has had no nausea or vomiting.  Exam: Vital signs: Blood pressure 128/78, pulse 84, respirations 17, temperature 98, oxygen saturation 98% on room air. Abdomen: Soft and nontender. No left flank tenderness.  Labs: BUN 17, creatinine 1.13 and estimated GFR 67 ml/minute on 04/27/2012.  Imaging: Follow-up CT of the abdomen was performed with and without contrast today. This demonstrates left-sided cryoablation defect completely encompassing the original roughly 3.3 cm tumor. There is no evidence of enhancement of ablated tissue after contrast administration. There is no evidence of complication following the procedure.  Assessment and Plan: Mr. Weaver is doing well following cryoablation of a left renal papillary carcinoma. CT demonstrates expected post ablation changes without complication. I have recommended follow-up imaging this December, 6 months after treatment.

## 2012-05-05 NOTE — Addendum Note (Signed)
Encounter addended by: Reola Calkins, MD on: 05/05/2012  3:44 PM<BR>     Documentation filed: Charting, Inpatient Notes

## 2012-06-25 ENCOUNTER — Other Ambulatory Visit (HOSPITAL_COMMUNITY): Payer: Self-pay | Admitting: Neurological Surgery

## 2012-06-25 ENCOUNTER — Other Ambulatory Visit: Payer: Self-pay | Admitting: Neurological Surgery

## 2012-06-25 DIAGNOSIS — M4802 Spinal stenosis, cervical region: Secondary | ICD-10-CM

## 2012-06-25 DIAGNOSIS — M546 Pain in thoracic spine: Secondary | ICD-10-CM

## 2012-06-25 DIAGNOSIS — M48061 Spinal stenosis, lumbar region without neurogenic claudication: Secondary | ICD-10-CM

## 2012-06-29 ENCOUNTER — Encounter (HOSPITAL_COMMUNITY): Payer: Self-pay | Admitting: Pharmacy Technician

## 2012-07-07 ENCOUNTER — Ambulatory Visit (HOSPITAL_COMMUNITY)
Admission: RE | Admit: 2012-07-07 | Discharge: 2012-07-07 | Disposition: A | Discharge status: Home / Self Care | Payer: Medicare Other | Source: Ambulatory Visit | Attending: Neurological Surgery | Admitting: Neurological Surgery

## 2012-07-07 DIAGNOSIS — M546 Pain in thoracic spine: Secondary | ICD-10-CM

## 2012-07-07 DIAGNOSIS — M47817 Spondylosis without myelopathy or radiculopathy, lumbosacral region: Secondary | ICD-10-CM | POA: Insufficient documentation

## 2012-07-07 DIAGNOSIS — M4802 Spinal stenosis, cervical region: Secondary | ICD-10-CM | POA: Insufficient documentation

## 2012-07-07 DIAGNOSIS — M48061 Spinal stenosis, lumbar region without neurogenic claudication: Secondary | ICD-10-CM

## 2012-07-07 DIAGNOSIS — M51379 Other intervertebral disc degeneration, lumbosacral region without mention of lumbar back pain or lower extremity pain: Secondary | ICD-10-CM | POA: Insufficient documentation

## 2012-07-07 DIAGNOSIS — M5137 Other intervertebral disc degeneration, lumbosacral region: Secondary | ICD-10-CM | POA: Insufficient documentation

## 2012-07-07 MED ORDER — HYDROCODONE-ACETAMINOPHEN 5-325 MG PO TABS: 1.0000 | ORAL_TABLET | ORAL | Status: DC | PRN

## 2012-07-07 MED ORDER — ONDANSETRON HCL 4 MG/2ML IJ SOLN: 4.0000 mg | Freq: Four times a day (QID) | INTRAMUSCULAR | Status: DC | PRN

## 2012-07-07 MED ORDER — DIAZEPAM 5 MG PO TABS
ORAL_TABLET | ORAL | Status: AC
  Filled 2012-07-07: qty 2

## 2012-07-07 MED ORDER — DIAZEPAM 5 MG PO TABS
10.0000 mg | ORAL_TABLET | Freq: Once | ORAL | Status: AC
  Administered 2012-07-07: 10 mg via ORAL

## 2012-07-07 MED ORDER — HYDROCODONE-ACETAMINOPHEN 5-325 MG PO TABS
ORAL_TABLET | ORAL | Status: AC
  Administered 2012-07-07: 2
  Filled 2012-07-07: qty 2

## 2012-07-07 MED ORDER — IOHEXOL 300 MG/ML  SOLN
10.0000 mL | Freq: Once | INTRAMUSCULAR | Status: AC | PRN
  Administered 2012-07-07: 10 mL via INTRATHECAL

## 2012-07-07 NOTE — Procedures (Signed)
Bryan Frey   #[id number] DOB:  1946/04/13 06/23/2012:     Bryan Frey returns to the office.  He has not been doing well.  He tells me that his stamina on his feet is exceedingly poor and his legs feel weak.  This is after we did a 2-level decompression L2-3 and L4-5 several months ago.  He had some concerns about his neck and I did an MRI of the neck and since that time I noticed that Bryan Frey is now taking Carbamazepine.  He tells me that this was started recently because he has had episodic pain in the right face and was diagnosed with trigeminal neuralgia.    I reviewed the MRI of the brain that he had in January of this year.  That study demonstrates that he has normal intracranial contents particularly there is nothing abnormal in the base of the brain at the exit of the trigeminal nerve.  There is no abnormality within the region of the pons.  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  This is fairly focal and central but because he had no overt myelopathic symptoms I suspect that the stenosis in the lumbar spine is aggravating it worse.    Now it seems that Bryan Frey is having continued worsening symptoms and he is quite dejected by this.  He notes that the Carbamazepine did well to halt the symptoms of pain from his trigeminal neuralgia; however, he does note that he had a twitch about his eye which is gone also since he has been on the Carbamazepine.    I have advised today that we proceed with total myelography.  I discussed this with Bryan Frey and he tells me that he did have a lumbar puncture some time ago which was rather painful and uncomfortable.  I noted that the purpose of the myelogram is to insert some dye into the spinal canal outlining the paths of the nerves and obtain x-rays and CT scan to see where the worst areas of compression and compromise are.  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          Stefani Dama, M.D./gde  NEUROSURGICAL CONSULTATION  Bryan Frey  #[id number] DOB:  December 14, 1945  November 18, 2011  CHIEF COMPLAINT:    Bilateral lower extremity weakness in the proximal legs going on for several months now.  HISTORY OF PRESENT ILLNESS:  Bryan Frey is a 66 year old right-handed individual who tells me that he has had some problems with progressive weakness in the lower extremities that has gotten worse significantly in the past couple of months.  He had been seen by Dr. Sherryll Burger, a neurologist at the [facility] and at that time he was noticing some change in his legs for the past several months.  Dr. 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  He also underwent an MRI of the cervical spine which demonstrated evidence of cord compression at C5-6 and C6-7 secondary to spondylitic overgrowth.  The cord demonstrates a large central protrusion of the disc at each level with some flattening of the cord but no overt intrinsic spinal cord changes.  The patient notes that he has had some dysesthesias in the right upper extremity along with some weakness there.  He notes that his gait has deteriorated.  It is particularly hard for him to get out of a seated position and taking the first several steps are quite difficult.  He uses a cane to get around pretty  regularly.  He notes that after he moves for a little bit his legs tend to move a bit better.  In November of 2011, he underwent a hip replacement on the left side.  He notes that after the hip replacement he could walk well until about mid-2012 when the symptoms started.  He also notes he has lost a fair amount of bulk in his thigh musculature.    PAST MEDICAL HISTORY:   His general health has been good.  He does have some hypertension.  Aside from the left hip replacement, he tells me he was never in the hospital before.    MEDICATIONS:    Currently he is medicated with Amlodipine Besylate and Hydrochlorothiazide 10/25, Tramadol for pain and Atorvastatin 10 mg. q.d.    ALLERGIES:     No known drug allergies.    SOCIAL HISTORY:    He does not smoke. He does not drink alcohol.  Height and weight have been stable.    REVIEW OF SYSTEMS:   He describes some swallowing difficulties which were felt to possibly be due to some ventral osteophytes in his neck.  He also describes an unusual pain along the left forehead radiating down in the region of V2 on the left side.  This is very sharp excruciating pain that occurs not infrequently.  He also notes a dysesthetic sensation of his right upper extremity. Otherwise on symptoms review, he notes leg weakness, leg pain while walking, high cholesterol, high blood pressure, ringing in the ears and wearing of glasses and night sweats.  PHYSICAL EXAMINATION:   On examination, I note that he will stand straight and erect after a brief period of time but he tends to favor a 10 degree forward stoop. His motor function reveals weakness in the iliopsoas and quadriceps both graded at 4/5, tibialis anterior and gastrocs are graded at 5/5 as noted on his gait.  DTR's are absent in the patella bilaterally.  Trace Achilles on the left and absent on the right.    IMPRESSION/PLAN:    The patient has difficulties with weakness in his lower extremity.  His exam noted that he does  not have any fasciculations in his legs and EMG and nerve conduction studies by Dr. Sherryll Burger shows only a mild sensory type neuropathy.  However, the spinal cord does show compression at C5-6 and C6-7 but there are no intrinsic cord changes.    Today in the office I obtained some plain radiographs of the lumbar spine to complete his workup.  This demonstrates that he has some moderate degrees of spondylitic changes in the low back. Particularly he has a retrolisthesis of L2 on L3 and this could be an area of some fairly focal stenosis that could explain some  of his neuropathic symptoms.  I demonstrated the findings to the patient and his wife and I explained to them my concern that the symptoms that he has in his lower extremities are not well explained by the focal cord compression in his neck.  Indeed, cord compression can cause weakness in the legs but generally is a much more diffuse process in the lower extremities and not very focally related to the major thigh muscles.  Because of the findings on the plain x-ray, I suggested that we followup with an MRI of the lumbar spine. I noted that the EMG and NCV study rules out some very important intrinsic diseases of the nerves but I am not certain that the MRI picture of the neck gives [place] a good reason for the symptoms he is experiencing.  Before I would suggest any kind of intervention to the cervical spine, I believe that the lumbar spine needs to be worked up more fully as I suspect the findings to significant stenosis at L2-3 may be the more certain cause of his difficulties at the current time.  I will see him back after the MRI is completed of the lumbar spine.    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

## 2012-08-16 IMAGING — CT CT GUIDANCE TISSUE ABLATION
1 of 16 series · 4 of 32 positions shown, 9 images · non-contrast
Comparison: none

CLINICAL DATA: Left lower pole renal papillary carcinoma confirmed
by prior biopsy.  The patient now presents for percutaneous
cryoablation of the tumor.

[Series 2: lt. renal cryo · axial · 0.87mm/px · z∈[-192,-57]mm · 4 of 47 slices shown, 9 images]
[im 10/47  soft-tissue]
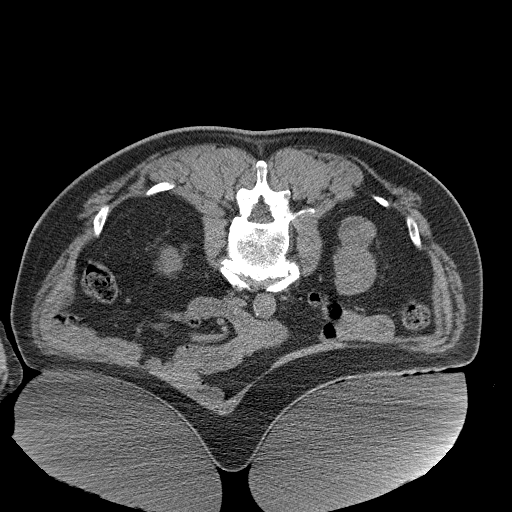
[im 10/47  lung]
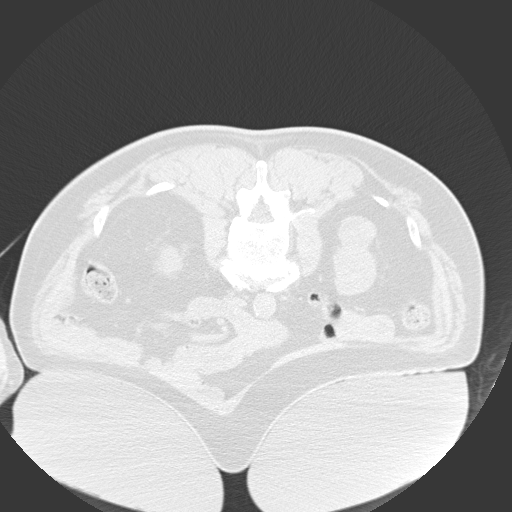
[im 10/47  bone]
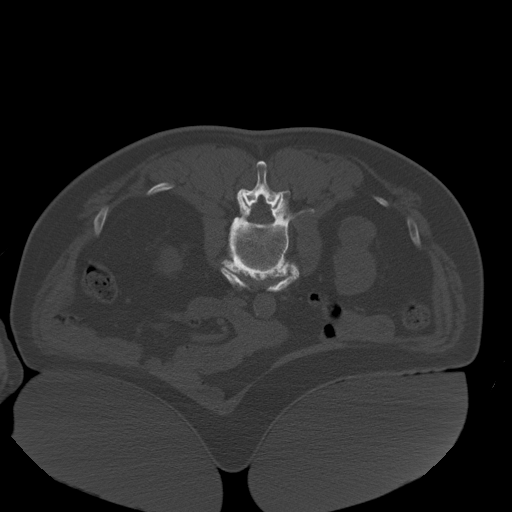
[im 19/47  soft-tissue]
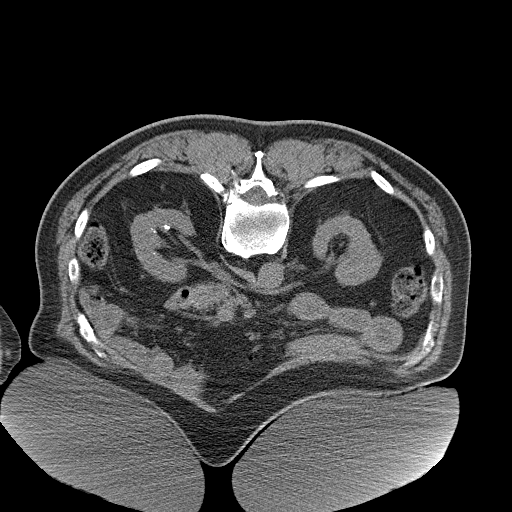
[im 19/47  lung]
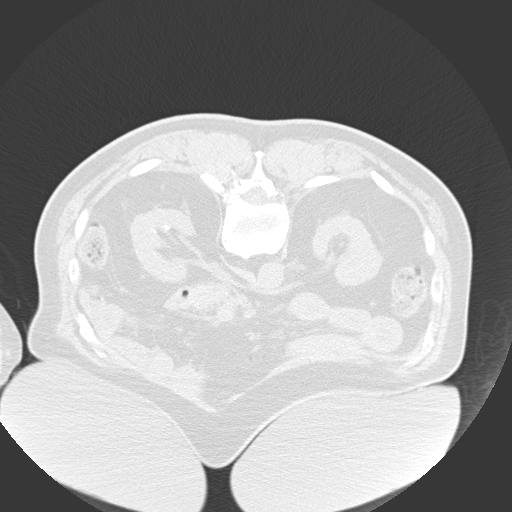
[im 28/47  soft-tissue]
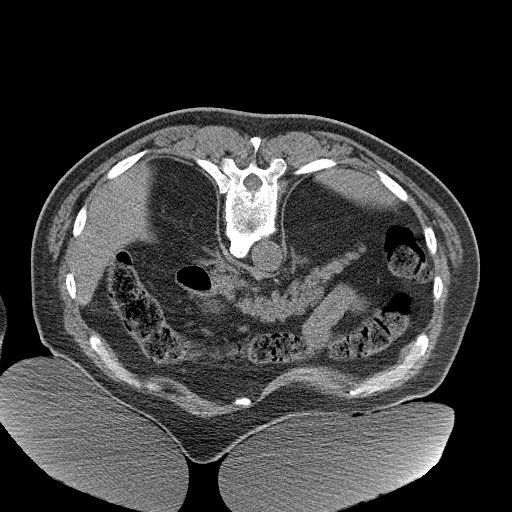
[im 28/47  lung]
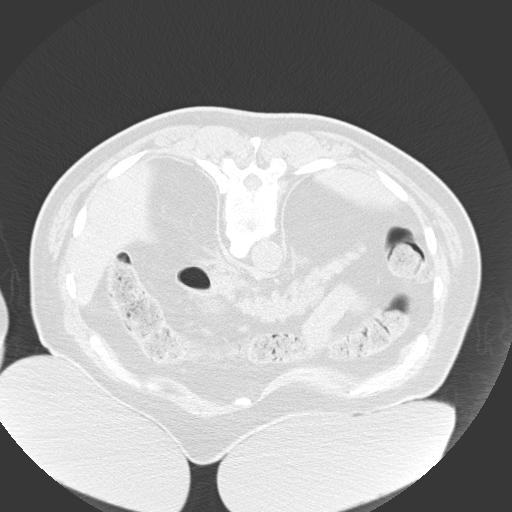
[im 37/47  soft-tissue]
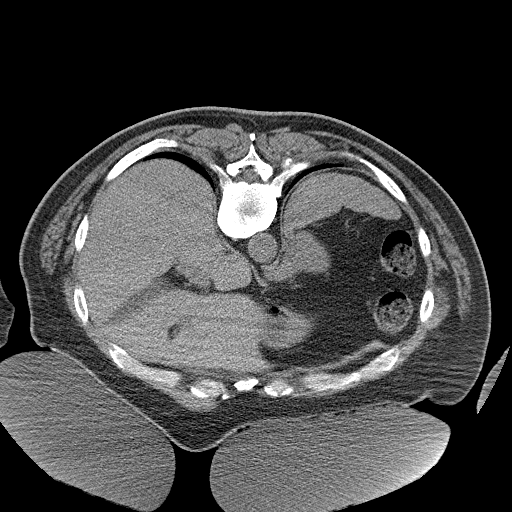
[im 37/47  lung]
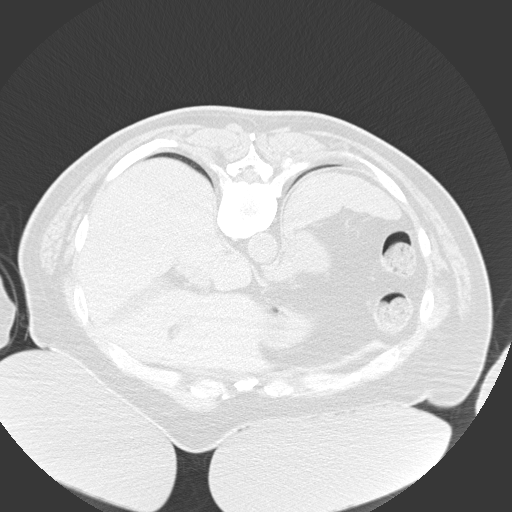

[4 of 32 positions shown; findings below may reference images not displayed]

CT-GUIDED PERCUTANEOUS CRYOABLATION OF LEFT RENAL CARCINOMA.

Anesthesia:  General

Medications:  2 grams IV Ancef.  As antibiotic prophylaxis, Ancef
was ordered pre-procedure and administered intravenously within one
hour of incision.

Procedure:  The procedure, risks, benefits, and alternatives were
explained to the patient.  Questions regarding the procedure were
encouraged and answered.  The patient understands and consents to
the procedure.

The patient was placed under general anesthesia.  Initial
unenhanced CT was performed in a prone position to localize the
left renal tumor.

The left flank region was prepped with Betadine in a sterile
fashion, and a sterile drape was applied covering the operative
field.  A sterile gown and sterile gloves were used for the
procedure.

Under CT guidance, a series of four Galil Ice Rod Plus percutaneous
cryoablation probes were advanced into the tumor.  Probe
positioning was confirmed by CT prior to cryoablation.

Cryoablation was performed through the four probes simultaneously.
Initial 10 minute cycle of cryoablation was performed.  This was
followed by a 8 to the minute thaw cycle.  A second 10 minute cycle
of cryoablation was then performed.  During ablation, periodic CT
imaging was performed to monitor ice ball formation and morphology.
After active thaw, the cryoablation probes were removed.

Complications: None
FINDINGS: The well-circumscribed papillary carcinoma is again
visualized emanating from the posterior lower pole cortex of the
left kidney.  Maximal tumor diameter is approximately 3.3 cm.
After placing four probes in a tandem array, cryoablation was
performed.  During the procedure, monitoring with CT shows
formation of an ice ball that completely encompasses the tumor.
There was no evidence of hemorrhage at the time of the procedure.
IMPRESSION: CT guided percutaneous cryoablation of left renal carcinoma.  The
patient will be observed overnight.  Initial follow-up will be
performed in approximately 4 weeks.

## 2012-09-16 ENCOUNTER — Other Ambulatory Visit: Payer: Self-pay | Admitting: Interventional Radiology

## 2012-09-16 DIAGNOSIS — C649 Malignant neoplasm of unspecified kidney, except renal pelvis: Secondary | ICD-10-CM

## 2012-09-23 ENCOUNTER — Telehealth: Payer: Self-pay | Admitting: Emergency Medicine

## 2012-09-23 NOTE — Telephone Encounter (Signed)
LM FOR THE NURSE AT THE [facility] W/ DR [facility] TO ADD BUN/CREAT TO THEIR ORDER ON 10-21-12.  MELISSA WILL ADD BUN/CREAT TO ORDER.

## 2012-11-02 ENCOUNTER — Ambulatory Visit
Admission: RE | Admit: 2012-11-02 | Discharge: 2012-11-02 | Disposition: A | Discharge status: Home / Self Care | Payer: Medicare Other | Source: Ambulatory Visit | Attending: Interventional Radiology | Admitting: Interventional Radiology

## 2012-11-02 ENCOUNTER — Ambulatory Visit (HOSPITAL_COMMUNITY)
Admission: RE | Admit: 2012-11-02 | Discharge: 2012-11-02 | Disposition: A | Discharge status: Home / Self Care | Payer: Medicare Other | Source: Ambulatory Visit | Attending: Interventional Radiology | Admitting: Interventional Radiology

## 2012-11-02 ENCOUNTER — Encounter (HOSPITAL_COMMUNITY): Payer: Self-pay

## 2012-11-02 DIAGNOSIS — C649 Malignant neoplasm of unspecified kidney, except renal pelvis: Secondary | ICD-10-CM

## 2012-11-02 DIAGNOSIS — N200 Calculus of kidney: Secondary | ICD-10-CM | POA: Insufficient documentation

## 2012-11-02 DIAGNOSIS — K8020 Calculus of gallbladder without cholecystitis without obstruction: Secondary | ICD-10-CM | POA: Insufficient documentation

## 2012-11-02 MED ORDER — IOHEXOL 300 MG/ML  SOLN
100.0000 mL | Freq: Once | INTRAMUSCULAR | Status: AC | PRN
  Administered 2012-11-02: 100 mL via INTRAVENOUS

## 2012-11-02 NOTE — Progress Notes (Signed)
Patient ID: Bryan Frey, male   DOB: 10/24/1945, 67 y.o.   MRN: [id number]  ESTABLISHED PATIENT OFFICE VISIT  Chief Complaint: Status post percutaneous cryoablation of a papillary renal carcinoma of the left kidney on 03/19/2012.  History: [name] returns for follow-up. He complains of some persistent bilateral lower extremity pain, right greater than left extending from the hip to the lower thigh region. He is status post decompressive lumbar spinal surgery by Dr. Delton See last May. A myelogram was also performed last October.  Review of Systems: No fever or chills. No left flank or abdominal pain. No hematuria or dysuria.  Exam: Vital signs: Blood pressure 149/84, pulse 81, respirations 15, temperature 97.9, oxygen saturation 98% on room air. General: No acute distress. Abdomen: Soft and nontender. No left flank tenderness.  Labs: BUN 18, creatinine 1.1 and estimated GFR greater than 60 ml/minute on 10/21/2012.  Imaging: Follow-up CT was performed today and demonstrates further evolution of a cryoablation defect in the lower pole of the left kidney with further retraction and decrease in size of the ablated soft tissue. No abnormal enhancement is identified in the ablated tissue and there is no evidence of metastatic disease.  Assessment and Plan: Further diminishment of left cryoablation defect with no evidence of recurrent carcinoma. The patient shows no signs of complication following the procedure. I have recommended another follow-up scan in June or July at which point the patient will be 1 year status post ablation.

## 2012-11-02 NOTE — Progress Notes (Signed)
Denies hematuria or any other problems w/ urination.  Denies pain associated w/ procedure.

## 2013-03-03 ENCOUNTER — Other Ambulatory Visit (HOSPITAL_COMMUNITY): Payer: Self-pay | Admitting: Interventional Radiology

## 2013-03-03 DIAGNOSIS — C642 Malignant neoplasm of left kidney, except renal pelvis: Secondary | ICD-10-CM

## 2013-03-08 ENCOUNTER — Other Ambulatory Visit: Payer: Self-pay | Admitting: Emergency Medicine

## 2013-03-08 DIAGNOSIS — C642 Malignant neoplasm of left kidney, except renal pelvis: Secondary | ICD-10-CM

## 2013-03-10 ENCOUNTER — Other Ambulatory Visit: Payer: Self-pay

## 2013-03-10 LAB — CREATININE, SERUM: EGFR (African American): >60

## 2013-03-23 ENCOUNTER — Ambulatory Visit
Admission: RE | Admit: 2013-03-23 | Discharge: 2013-03-23 | Disposition: A | Discharge status: Home / Self Care | Payer: Medicare Other | Source: Ambulatory Visit | Attending: Interventional Radiology | Admitting: Interventional Radiology

## 2013-03-23 ENCOUNTER — Encounter (HOSPITAL_COMMUNITY): Payer: Self-pay

## 2013-03-23 ENCOUNTER — Ambulatory Visit (HOSPITAL_COMMUNITY): Payer: Medicare Other

## 2013-03-23 ENCOUNTER — Ambulatory Visit (HOSPITAL_COMMUNITY)
Admission: RE | Admit: 2013-03-23 | Discharge: 2013-03-23 | Disposition: A | Discharge status: Home / Self Care | Payer: Medicare Other | Source: Ambulatory Visit | Attending: Interventional Radiology | Admitting: Interventional Radiology

## 2013-03-23 ENCOUNTER — Other Ambulatory Visit: Payer: Self-pay | Admitting: Psychiatry

## 2013-03-23 DIAGNOSIS — C642 Malignant neoplasm of left kidney, except renal pelvis: Secondary | ICD-10-CM

## 2013-03-23 DIAGNOSIS — N281 Cyst of kidney, acquired: Secondary | ICD-10-CM | POA: Insufficient documentation

## 2013-03-23 DIAGNOSIS — C649 Malignant neoplasm of unspecified kidney, except renal pelvis: Secondary | ICD-10-CM | POA: Insufficient documentation

## 2013-03-23 DIAGNOSIS — N200 Calculus of kidney: Secondary | ICD-10-CM | POA: Insufficient documentation

## 2013-03-23 DIAGNOSIS — M479 Spondylosis, unspecified: Secondary | ICD-10-CM | POA: Insufficient documentation

## 2013-03-23 DIAGNOSIS — K8020 Calculus of gallbladder without cholecystitis without obstruction: Secondary | ICD-10-CM | POA: Insufficient documentation

## 2013-03-23 MED ORDER — IOHEXOL 300 MG/ML  SOLN
100.0000 mL | Freq: Once | INTRAMUSCULAR | Status: AC | PRN
  Administered 2013-03-23: 100 mL via INTRAVENOUS

## 2013-03-23 NOTE — Progress Notes (Signed)
Patient ID: AMEN STASZAK, male   DOB: [birth year], 67 y.o.   MRN: [id number]  ESTABLISHED PATIENT OFFICE VISIT  Chief Complaint: Status post percutaneous cryoablation of a left papillary renal carcinoma on 03/19/2012.  History: Mr. Lederman returns for follow-up. He has been doing well. He has had some persistent left leg pain after prior total hip replacement and has been told that his left leg is shorter than his right. He is being worked up for possible treatments.  Review of Systems: No fever or chills. No hematuria or dysuria. Chronic urinary urgency is stable. No abdominal pain or flank pain.  Exam: Vital signs: Blood pressure 136/74, pulse 84, respirations 16, oxygen saturation 96% on room air. General: No acute distress. Abdomen: Soft and nontender. No flank tenderness.  Labs: Creatinine 1.20 and estimated GFR greater than 60 ml/minute.  Imaging: Follow-up CT was performed today and demonstrates stable to slightly smaller ablation tissue at the level of the lower pole left renal papillary carcinoma. The ablated tissue shows no evidence of enhancement after contrast administration.  Assessment and Plan: No evidence of left papillary renal carcinoma recurrence 1 year post cryoablation. There is no evidence of complication or renal dysfunction after the procedure. I have recommended another follow-up CT in 1 year.

## 2013-03-23 NOTE — Progress Notes (Addendum)
Denies hematuria.  Occasional urinary urgency.  Denies dis.comfort associated w/ cryoablation.    Patient states that he has know kidney stones.  To follow up w/ Dr Retta Diones as needed for that.  Next routine appointment w/ Dr Retta Diones for 12/23/2013.  Stryker Veasey Carmell [place], RN 03/23/2013 11:06 AM

## 2013-04-01 ENCOUNTER — Ambulatory Visit: Payer: Self-pay | Admitting: Psychiatry

## 2013-12-28 ENCOUNTER — Other Ambulatory Visit: Payer: Self-pay | Admitting: Urology

## 2013-12-28 DIAGNOSIS — C649 Malignant neoplasm of unspecified kidney, except renal pelvis: Secondary | ICD-10-CM

## 2013-12-28 DIAGNOSIS — N200 Calculus of kidney: Secondary | ICD-10-CM

## 2014-01-25 ENCOUNTER — Encounter (HOSPITAL_COMMUNITY): Payer: Self-pay | Admitting: Pharmacy Technician

## 2014-01-25 NOTE — Patient Instructions (Addendum)
[place]  01/25/2014   Your procedure is scheduled on: Thursday May 7th, 2015  Report to [facility] at  730 AM.  Call this number if you have problems the morning of surgery [phone]   Remember:  Do not eat food or drink liquids :After Midnight.     Take these medicines the morning of surgery with A SIP OF WATER: Tegretrol                               You may not have any metal on your body including hair pins and piercings  Do not wear jewelry, make-up, lotions, powders, or deodorant.   Men may shave face and neck.  Do not bring valuables to the hospital. [place].  Contacts, dentures or bridgework may not be worn into surgery.  Leave suitcase in the car. After surgery it may be brought to your room.  For patients admitted to the hospital, checkout time is 11:00 AM the day of discharge.   Patients discharged the day of surgery will not be allowed to drive home.  Name and phone number of your driver:  Special Instructions: N/A  [facility] - Preparing for Surgery Before surgery, you can play an important role.  Because skin is not sterile, your skin needs to be as free of germs as possible.  You can reduce the number of germs on your skin by washing with CHG (chlorahexidine gluconate) soap before surgery.  CHG is an antiseptic cleaner which kills germs and bonds with the skin to continue killing germs even after washing. Please DO NOT use if you have an allergy to CHG or antibacterial soaps.  If your skin becomes reddened/irritated stop using the CHG and inform your nurse when you arrive at Short Stay. Do not shave (including legs and underarms) for at least 48 hours prior to the first CHG shower.  You may shave your face. Please follow these instructions carefully:  1.  Shower with CHG Soap the night before surgery and the  morning of Surgery.  2.  If you choose to wash your hair, wash your hair first as usual with your   normal  shampoo.  3.  After you shampoo, rinse your hair and body thoroughly to remove the  shampoo.                           4.  Use CHG as you would any other liquid soap.  You can apply chg directly  to the skin and wash                       Gently with a scrungie or clean washcloth.  5.  Apply the CHG Soap to your body ONLY FROM THE NECK DOWN.   Do not use on open                           Wound or open sores. Avoid contact with eyes, ears mouth and genitals (private parts).                        Genitals (private parts) with your normal soap.             6.  Wash thoroughly, paying special attention to the  area where your surgery  will be performed.  7.  Thoroughly rinse your body with warm water from the neck down.  8.  DO NOT shower/wash with your normal soap after using and rinsing off  the CHG Soap.                9.  Pat yourself dry with a clean towel.            10.  Wear clean pajamas.            11.  Place clean sheets on your bed the night of your first shower and do not  sleep with pets. Day of Surgery : Do not apply any lotions/deodorants the morning of surgery.  Please wear clean clothes to the hospital/surgery center.  FAILURE TO FOLLOW THESE INSTRUCTIONS MAY RESULT IN THE CANCELLATION OF YOUR SURGERY PATIENT SIGNATURE_________________________________  NURSE SIGNATURE__________________________________  WHAT IS A BLOOD TRANSFUSION? Blood Transfusion Information  A transfusion is the replacement of blood or some of its parts. Blood is made up of multiple cells which provide different functions.  Red blood cells carry oxygen and are used for blood loss replacement.  White blood cells fight against infection.  Platelets control bleeding.  Plasma helps clot blood.  Other blood products are available for specialized needs, such as hemophilia or other clotting disorders. BEFORE THE TRANSFUSION  Who gives blood for transfusions?   Healthy volunteers who are fully  evaluated to make sure their blood is safe. This is blood bank blood. Transfusion therapy is the safest it has ever been in the practice of medicine. Before blood is taken from a donor, a complete history is taken to make sure that person has no history of diseases nor engages in risky social behavior (examples are intravenous drug use or sexual activity with multiple partners). The donor's travel history is screened to minimize risk of transmitting infections, such as malaria. The donated blood is tested for signs of infectious diseases, such as HIV and hepatitis. The blood is then tested to be sure it is compatible with you in order to minimize the chance of a transfusion reaction. If you or a relative donates blood, this is often done in anticipation of surgery and is not appropriate for emergency situations. It takes many days to process the donated blood. RISKS AND COMPLICATIONS Although transfusion therapy is very safe and saves many lives, the main dangers of transfusion include:   Getting an infectious disease.  Developing a transfusion reaction. This is an allergic reaction to something in the blood you were given. Every precaution is taken to prevent this. The decision to have a blood transfusion has been considered carefully by your caregiver before blood is given. Blood is not given unless the benefits outweigh the risks. AFTER THE TRANSFUSION  Right after receiving a blood transfusion, you will usually feel much better and more energetic. This is especially true if your red blood cells have gotten low (anemic). The transfusion raises the level of the red blood cells which carry oxygen, and this usually causes an energy increase.  The nurse administering the transfusion will monitor you carefully for complications. HOME CARE INSTRUCTIONS  No special instructions are needed after a transfusion. You may find your energy is better. Speak with your caregiver about any limitations on activity  for underlying diseases you may have. SEEK MEDICAL CARE IF:   Your condition is not improving after your transfusion.  You develop redness or irritation at the intravenous (IV)  site. SEEK IMMEDIATE MEDICAL CARE IF:  Any of the following symptoms occur over the next 12 hours:  Shaking chills.  You have a temperature by mouth above 102 F (38.9 C), not controlled by medicine.  Chest, back, or muscle pain.  People around you feel you are not acting correctly or are confused.  Shortness of breath or difficulty breathing.  Dizziness and fainting.  You get a rash or develop hives.  You have a decrease in urine output.  Your urine turns a dark color or changes to pink, red, or brown. Any of the following symptoms occur over the next 10 days:  You have a temperature by mouth above 102 F (38.9 C), not controlled by medicine.  Shortness of breath.  Weakness after normal activity.  The white part of the eye turns yellow (jaundice).  You have a decrease in the amount of urine or are urinating less often.  Your urine turns a dark color or changes to pink, red, or brown. Document Released: 09/19/2000 Document Revised: 12/15/2011 Document Reviewed: 05/08/2008 [facility] Patient Information 2014 [place].

## 2014-01-27 ENCOUNTER — Encounter (HOSPITAL_COMMUNITY)
Admission: RE | Admit: 2014-01-27 | Discharge: 2014-01-27 | Disposition: A | Discharge status: Home / Self Care | Payer: Medicare Other | Source: Ambulatory Visit | Attending: Urology | Admitting: Urology

## 2014-01-27 ENCOUNTER — Encounter (HOSPITAL_COMMUNITY): Payer: Self-pay

## 2014-01-27 ENCOUNTER — Ambulatory Visit (HOSPITAL_COMMUNITY)
Admission: RE | Admit: 2014-01-27 | Discharge: 2014-01-27 | Disposition: A | Discharge status: Home / Self Care | Payer: Medicare Other | Source: Ambulatory Visit | Attending: Anesthesiology | Admitting: Anesthesiology

## 2014-01-27 DIAGNOSIS — Z01812 Encounter for preprocedural laboratory examination: Secondary | ICD-10-CM | POA: Insufficient documentation

## 2014-01-27 DIAGNOSIS — Z01810 Encounter for preprocedural cardiovascular examination: Secondary | ICD-10-CM | POA: Insufficient documentation

## 2014-01-27 DIAGNOSIS — Z01818 Encounter for other preprocedural examination: Secondary | ICD-10-CM | POA: Insufficient documentation

## 2014-01-27 LAB — CBC
HCT: 41.1 % (ref 39.0–52.0)
Hemoglobin: 13.5 g/dL (ref 13.0–17.0)
MCH: 28.7 pg (ref 26.0–34.0)
MCHC: 32.8 g/dL (ref 30.0–36.0)
MCV: 87.4 fL (ref 78.0–100.0)
PLATELETS: 296 10*3/uL (ref 150–400)
RBC: 4.7 MIL/uL (ref 4.22–5.81)
RDW: 13.2 % (ref 11.5–15.5)
WBC: 6.2 10*3/uL (ref 4.0–10.5)

## 2014-01-27 LAB — BASIC METABOLIC PANEL
BUN: 15 mg/dL (ref 6–23)
CALCIUM: 9.3 mg/dL (ref 8.4–10.5)
CO2: 26 meq/L (ref 19–32)
CREATININE: 1.06 mg/dL (ref 0.50–1.35)
Chloride: 99 mEq/L (ref 96–112)
GFR calc Af Amer: 81 mL/min — ABNORMAL LOW (ref >90)
GFR, EST NON AFRICAN AMERICAN: 70 mL/min — AB (ref >90)
Glucose, Bld: 105 mg/dL — ABNORMAL HIGH (ref 70–99)
Potassium: 4.2 mEq/L (ref 3.7–5.3)
SODIUM: 137 meq/L (ref 137–147)

## 2014-01-27 NOTE — Progress Notes (Signed)
01/27/14 0958  OBSTRUCTIVE SLEEP APNEA  Have you ever been diagnosed with sleep apnea through a sleep study? No  Do you snore loudly (loud enough to be heard through closed doors)?  0  Do you often feel tired, fatigued, or sleepy during the daytime? 1  Has anyone observed you stop breathing during your sleep? 0  Do you have, or are you being treated for high blood pressure? 1  BMI more than 35 kg/m2? 0  Age over 69 years old? 1  Neck circumference greater than 40 cm/16 inches? 1  Gender: 1  Obstructive Sleep Apnea Score 5  Score 4 or greater  Results sent to PCP

## 2014-02-06 ENCOUNTER — Other Ambulatory Visit: Payer: Self-pay | Admitting: Radiology

## 2014-02-08 NOTE — H&P (Signed)
Urology History and Physical Exam  CC: Kidney stones  HPI: 68 year old male presents at this time for percutaneous nephrolithotomy of multiple enlarging right renal calculi. His history is as follows:   RENAL CELL CARCINOMA     He was initially seen in April, 2013. Referral at that time was by Dr. [name] in [place]. The patient was noted to have an enhancing 3 cm left lower pole exophytic lesion. He was having significant back issues at that time. He has had a lumbar back procedure by Dr. Ellene Route in the interim. He underwent CT directed biopsy of this lesion by Dr. 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 It was felt that treatment of this lesion could wait until his back surgery. He ended up being referred to Dr. Aletta Edouard for eventual cryoablation, which was performed in June of 2013. He tolerated this procedure well, and had no significant peri-or postoperative complications. He was most recently seen by Dr. Kathlene Cote in January, 2014. Evaluation included CT of the abdomen and pelvis which revealed a nonfunctioning lower pole lesion consistent with treated renal cell carcinoma.    NEPHROLITHIASIS OF BOTH KIDNEYs    CT scan in January 2014 also revealed bilateral renal calculi, the largest of which was approximately 10 mm in the interpolar region of the right kidney. Recent CT scan approximately 2 months ago revealed enlarging right renal calculi, largest of which was over 15 mm in size. Aggregate diameter is well over 30 mm. Because of enlarging stones, it was recommended that he have his treated, and because of the number of these, percutaneous approach was discussed. He is aware of risks and complications. He desires to proceed.   PMH: Past Medical History  Diagnosis Date  . Nephrolithiasis   . High cholesterol   . 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 Hypertension     EKG,  chest  4/13 EPIC  . Anxiety     with diagnosis  . Gall stones   . left renal  ca dx'd 03/2012    tumor in kidney .Marland Kitchen    PSH: Past Surgical History  Procedure Laterality Date  . Joint replacement  2011    Lt hip  . Lumbar laminectomy/decompression microdiscectomy  02/09/2012    Procedure: LUMBAR LAMINECTOMY/DECOMPRESSION MICRODISCECTOMY 2 LEVELS;  Surgeon: Kristeen Miss, MD;  Location: [place] [facility];  Service: Neurosurgery;  Laterality: Bilateral;  Bilateral Lumbar two-three,lumbar four-five laminectomies    Allergies: Allergies  Allergen Reactions  . Morphine And Related     Hallucinations    Medications: No prescriptions prior to admission     Social History: History   Social History  . Marital Status: Married    Spouse Name: N/A    Number of Children: N/A  . Years of Education: N/A   Occupational History  . Not on file.   Social History Main Topics  . Smoking status: Never Smoker   . Smokeless tobacco: Never Used  . Alcohol Use: No  . Drug Use: No     Comment: tramadol  . Sexual Activity: Not on file   Other Topics Concern  . Not on file   Social History Narrative  . No narrative on file    Family History: Family History  Problem Relation Age of Onset  . Anesthesia problems Neg Hx     Review of Systems: All normal                 Physical Exam: @  OMVEHM0@ General: No acute distress.  Awake. Head:  Normocephalic.  Atraumatic. ENT:  EOMI.  Mucous membranes moist Neck:  Supple.  No lymphadenopathy. CV:  S1 present. S2 present. Regular rate. Pulmonary: Equal effort bilaterally.  Clear to auscultation bilaterally. Abdomen: Soft.  Non- tender to palpation. Skin:  Normal turgor.  No visible rash. Extremity: No gross deformity of bilateral upper extremities.  No gross deformity of                             lower extremities. Neurologic: Alert. Appropriate mood.    Studies:  No results found for this basename: HGB, WBC, PLT,  in the last 72 hours  No results found for this basename: NA, K, CL, CO2, BUN, CREATININE, CALCIUM,  MAGNESIUM, GFRNONAA, GFRAA,  in the last 72 hours   No results found for this basename: PT, INR, APTT,  in the last 72 hours   No components found with this basename: ABG,     Assessment:  Multiple large right renal calculi, aggregate diameter greater than 30 mm  Plan: Right percutaneous nephrolithotomy

## 2014-02-09 ENCOUNTER — Observation Stay (HOSPITAL_COMMUNITY)
Admission: RE | Admit: 2014-02-09 | Discharge: 2014-02-11 | Disposition: A | Discharge status: Home / Self Care | Payer: Medicare Other | Source: Ambulatory Visit | Attending: Urology | Admitting: Urology

## 2014-02-09 ENCOUNTER — Encounter (HOSPITAL_COMMUNITY): Payer: Medicare Other | Admitting: Anesthesiology

## 2014-02-09 ENCOUNTER — Ambulatory Visit (HOSPITAL_COMMUNITY): Payer: Medicare Other

## 2014-02-09 ENCOUNTER — Ambulatory Visit (HOSPITAL_COMMUNITY): Payer: Medicare Other | Admitting: Anesthesiology

## 2014-02-09 ENCOUNTER — Ambulatory Visit (HOSPITAL_COMMUNITY)
Admission: RE | Admit: 2014-02-09 | Discharge: 2014-02-09 | Disposition: A | Discharge status: Home / Self Care | Payer: Medicare Other | Source: Ambulatory Visit | Attending: Urology | Admitting: Urology

## 2014-02-09 ENCOUNTER — Encounter (HOSPITAL_COMMUNITY): Payer: Self-pay

## 2014-02-09 ENCOUNTER — Encounter (HOSPITAL_COMMUNITY)
Admission: RE | Disposition: A | Discharge status: Home / Self Care | Payer: Self-pay | Source: Ambulatory Visit | Attending: Urology

## 2014-02-09 VITALS — BP 138/83 | HR 67 | Temp 96.9°F | Resp 17

## 2014-02-09 DIAGNOSIS — N200 Calculus of kidney: Secondary | ICD-10-CM

## 2014-02-09 DIAGNOSIS — C649 Malignant neoplasm of unspecified kidney, except renal pelvis: Secondary | ICD-10-CM

## 2014-02-09 DIAGNOSIS — Z96649 Presence of unspecified artificial hip joint: Secondary | ICD-10-CM | POA: Insufficient documentation

## 2014-02-09 DIAGNOSIS — R112 Nausea with vomiting, unspecified: Secondary | ICD-10-CM | POA: Insufficient documentation

## 2014-02-09 DIAGNOSIS — E7800 Pure hypercholesterolemia, unspecified: Secondary | ICD-10-CM | POA: Insufficient documentation

## 2014-02-09 DIAGNOSIS — I10 Essential (primary) hypertension: Secondary | ICD-10-CM | POA: Insufficient documentation

## 2014-02-09 DIAGNOSIS — Z79899 Other long term (current) drug therapy: Secondary | ICD-10-CM | POA: Insufficient documentation

## 2014-02-09 HISTORY — PX: NEPHROLITHOTOMY: SHX5134

## 2014-02-09 LAB — BASIC METABOLIC PANEL
BUN: 17 mg/dL (ref 6–23)
CALCIUM: 9 mg/dL (ref 8.4–10.5)
CO2: 26 mEq/L (ref 19–32)
Chloride: 100 mEq/L (ref 96–112)
Creatinine, Ser: 1.04 mg/dL (ref 0.50–1.35)
GFR, EST AFRICAN AMERICAN: 83 mL/min — AB (ref >90)
GFR, EST NON AFRICAN AMERICAN: 71 mL/min — AB (ref >90)
GLUCOSE: 111 mg/dL — AB (ref 70–99)
POTASSIUM: 4.2 meq/L (ref 3.7–5.3)
Sodium: 140 mEq/L (ref 137–147)

## 2014-02-09 LAB — CBC
HCT: 42 % (ref 39.0–52.0)
HEMOGLOBIN: 13.8 g/dL (ref 13.0–17.0)
MCH: 29.4 pg (ref 26.0–34.0)
MCHC: 32.9 g/dL (ref 30.0–36.0)
MCV: 89.4 fL (ref 78.0–100.0)
PLATELETS: 269 10*3/uL (ref 150–400)
RBC: 4.7 MIL/uL (ref 4.22–5.81)
RDW: 13.6 % (ref 11.5–15.5)
WBC: 7.1 10*3/uL (ref 4.0–10.5)

## 2014-02-09 LAB — PROTIME-INR
INR: 1.04 (ref 0.00–1.49)
PROTHROMBIN TIME: 13.4 s (ref 11.6–15.2)

## 2014-02-09 LAB — TYPE AND SCREEN
ABO/RH(D): O NEG
ANTIBODY SCREEN: NEGATIVE

## 2014-02-09 LAB — APTT: APTT: 29 s (ref 24–37)

## 2014-02-09 SURGERY — NEPHROLITHOTOMY PERCUTANEOUS
Anesthesia: General | Site: Back | Laterality: Right

## 2014-02-09 MED ORDER — DOCUSATE SODIUM 100 MG PO CAPS
100.0000 mg | ORAL_CAPSULE | Freq: Two times a day (BID) | ORAL | Status: DC
  Administered 2014-02-09 – 2014-02-10 (×3): 100 mg via ORAL
  Filled 2014-02-09 (×5): qty 1

## 2014-02-09 MED ORDER — FENTANYL CITRATE 0.05 MG/ML IJ SOLN
INTRAMUSCULAR | Status: AC | PRN
  Administered 2014-02-09: 25 ug via INTRAVENOUS
  Administered 2014-02-09 (×2): 50 ug via INTRAVENOUS
  Administered 2014-02-09: 25 ug via INTRAVENOUS

## 2014-02-09 MED ORDER — HYDROMORPHONE HCL PF 1 MG/ML IJ SOLN
0.2500 mg | INTRAMUSCULAR | Status: DC | PRN
  Administered 2014-02-09 (×2): 0.5 mg via INTRAVENOUS

## 2014-02-09 MED ORDER — CEFAZOLIN SODIUM-DEXTROSE 2-3 GM-% IV SOLR
2.0000 g | INTRAVENOUS | Status: AC
  Administered 2014-02-09: 2 g via INTRAVENOUS
  Filled 2014-02-09: qty 50

## 2014-02-09 MED ORDER — DEXTROSE-NACL 5-0.45 % IV SOLN
INTRAVENOUS | Status: DC
  Administered 2014-02-09 – 2014-02-11 (×4): via INTRAVENOUS

## 2014-02-09 MED ORDER — MIDAZOLAM HCL 2 MG/2ML IJ SOLN
INTRAMUSCULAR | Status: AC
  Filled 2014-02-09: qty 6

## 2014-02-09 MED ORDER — ONDANSETRON HCL 4 MG/2ML IJ SOLN
INTRAMUSCULAR | Status: AC
  Filled 2014-02-09: qty 2

## 2014-02-09 MED ORDER — LACTATED RINGERS IV SOLN: INTRAVENOUS | Status: DC

## 2014-02-09 MED ORDER — ONDANSETRON HCL 4 MG/2ML IJ SOLN
INTRAMUSCULAR | Status: DC | PRN
  Administered 2014-02-09: 4 mg via INTRAVENOUS

## 2014-02-09 MED ORDER — FENTANYL CITRATE 0.05 MG/ML IJ SOLN
INTRAMUSCULAR | Status: AC
  Filled 2014-02-09: qty 2

## 2014-02-09 MED ORDER — HYDROMORPHONE HCL PF 1 MG/ML IJ SOLN
INTRAMUSCULAR | Status: AC
  Filled 2014-02-09: qty 1

## 2014-02-09 MED ORDER — SODIUM CHLORIDE 0.9 % IV SOLN
INTRAVENOUS | Status: DC
  Administered 2014-02-09: 08:00:00 via INTRAVENOUS

## 2014-02-09 MED ORDER — SUCCINYLCHOLINE CHLORIDE 20 MG/ML IJ SOLN
INTRAMUSCULAR | Status: DC | PRN
  Administered 2014-02-09: 100 mg via INTRAVENOUS

## 2014-02-09 MED ORDER — DEXAMETHASONE SODIUM PHOSPHATE 10 MG/ML IJ SOLN
INTRAMUSCULAR | Status: AC
  Filled 2014-02-09: qty 1

## 2014-02-09 MED ORDER — STERILE WATER FOR IRRIGATION IR SOLN: Status: DC | PRN

## 2014-02-09 MED ORDER — LACTATED RINGERS IV SOLN
INTRAVENOUS | Status: DC
  Administered 2014-02-09: 1000 mL via INTRAVENOUS

## 2014-02-09 MED ORDER — IOHEXOL 300 MG/ML  SOLN
20.0000 mL | Freq: Once | INTRAMUSCULAR | Status: AC | PRN
  Administered 2014-02-09: 20 mL

## 2014-02-09 MED ORDER — SODIUM CHLORIDE 0.9 % IR SOLN
Status: DC | PRN
  Administered 2014-02-09: 1000 mL
  Administered 2014-02-09: 6000 mL
  Administered 2014-02-09: 1000 mL

## 2014-02-09 MED ORDER — MIDAZOLAM HCL 2 MG/2ML IJ SOLN
INTRAMUSCULAR | Status: AC
  Filled 2014-02-09: qty 2

## 2014-02-09 MED ORDER — TAMSULOSIN HCL 0.4 MG PO CAPS
0.4000 mg | ORAL_CAPSULE | Freq: Every day | ORAL | Status: DC
  Administered 2014-02-09 – 2014-02-10 (×2): 0.4 mg via ORAL
  Filled 2014-02-09 (×3): qty 1

## 2014-02-09 MED ORDER — HYDROMORPHONE HCL PF 1 MG/ML IJ SOLN
1.0000 mg | INTRAMUSCULAR | Status: DC | PRN
  Administered 2014-02-09 – 2014-02-10 (×3): 2 mg via INTRAVENOUS
  Filled 2014-02-09 (×3): qty 2

## 2014-02-09 MED ORDER — PROPOFOL 10 MG/ML IV BOLUS
INTRAVENOUS | Status: AC
  Filled 2014-02-09: qty 20

## 2014-02-09 MED ORDER — DEXAMETHASONE SODIUM PHOSPHATE 10 MG/ML IJ SOLN
INTRAMUSCULAR | Status: DC | PRN
  Administered 2014-02-09: 10 mg via INTRAVENOUS

## 2014-02-09 MED ORDER — HYDROCHLOROTHIAZIDE 25 MG PO TABS
25.0000 mg | ORAL_TABLET | Freq: Every evening | ORAL | Status: DC
  Administered 2014-02-09 – 2014-02-10 (×2): 25 mg via ORAL
  Filled 2014-02-09 (×3): qty 1

## 2014-02-09 MED ORDER — ONDANSETRON HCL 4 MG/2ML IJ SOLN
4.0000 mg | INTRAMUSCULAR | Status: DC | PRN
  Administered 2014-02-09 – 2014-02-10 (×3): 4 mg via INTRAVENOUS
  Filled 2014-02-09 (×3): qty 2

## 2014-02-09 MED ORDER — LIDOCAINE HCL (CARDIAC) 20 MG/ML IV SOLN
INTRAVENOUS | Status: DC | PRN
  Administered 2014-02-09: 100 mg via INTRAVENOUS

## 2014-02-09 MED ORDER — LIDOCAINE HCL 1 % IJ SOLN
INTRAMUSCULAR | Status: DC
Start: 2014-02-09 — End: 2014-02-10
  Filled 2014-02-09: qty 20

## 2014-02-09 MED ORDER — LIDOCAINE HCL (CARDIAC) 20 MG/ML IV SOLN
INTRAVENOUS | Status: AC
  Filled 2014-02-09: qty 5

## 2014-02-09 MED ORDER — CEFAZOLIN SODIUM-DEXTROSE 2-3 GM-% IV SOLR: 2.0000 g | INTRAVENOUS | Status: DC

## 2014-02-09 MED ORDER — MIDAZOLAM HCL 2 MG/2ML IJ SOLN
INTRAMUSCULAR | Status: AC | PRN
  Administered 2014-02-09: 0.5 mg via INTRAVENOUS
  Administered 2014-02-09: 1 mg via INTRAVENOUS
  Administered 2014-02-09: 0.5 mg via INTRAVENOUS
  Administered 2014-02-09: 1 mg via INTRAVENOUS
  Administered 2014-02-09 (×4): 0.5 mg via INTRAVENOUS

## 2014-02-09 MED ORDER — PROPOFOL 10 MG/ML IV BOLUS
INTRAVENOUS | Status: DC | PRN
  Administered 2014-02-09: 160 mg via INTRAVENOUS

## 2014-02-09 MED ORDER — KETOROLAC TROMETHAMINE 15 MG/ML IJ SOLN
15.0000 mg | Freq: Four times a day (QID) | INTRAMUSCULAR | Status: DC
  Administered 2014-02-09 – 2014-02-11 (×7): 15 mg via INTRAVENOUS
  Filled 2014-02-09 (×11): qty 1

## 2014-02-09 MED ORDER — CIPROFLOXACIN HCL 250 MG PO TABS: 250.0000 mg | ORAL_TABLET | Freq: Two times a day (BID) | ORAL | Status: DC

## 2014-02-09 MED ORDER — CIPROFLOXACIN HCL 500 MG PO TABS
500.0000 mg | ORAL_TABLET | Freq: Two times a day (BID) | ORAL | Status: DC
  Administered 2014-02-09 – 2014-02-10 (×3): 500 mg via ORAL
  Filled 2014-02-09 (×6): qty 1

## 2014-02-09 MED ORDER — FENTANYL CITRATE 0.05 MG/ML IJ SOLN
INTRAMUSCULAR | Status: AC
  Filled 2014-02-09: qty 6

## 2014-02-09 MED ORDER — IOHEXOL 300 MG/ML  SOLN
INTRAMUSCULAR | Status: DC | PRN
  Administered 2014-02-09: 30 mL

## 2014-02-09 MED ORDER — ADULT MULTIVITAMIN W/MINERALS CH
1.0000 | ORAL_TABLET | Freq: Every day | ORAL | Status: DC
  Administered 2014-02-09 – 2014-02-10 (×2): 1 via ORAL
  Filled 2014-02-09 (×3): qty 1

## 2014-02-09 MED ORDER — AMLODIPINE BESYLATE 10 MG PO TABS
10.0000 mg | ORAL_TABLET | Freq: Every evening | ORAL | Status: DC
  Administered 2014-02-09 – 2014-02-10 (×2): 10 mg via ORAL
  Filled 2014-02-09 (×3): qty 1

## 2014-02-09 MED ORDER — CARBAMAZEPINE 200 MG PO TABS
200.0000 mg | ORAL_TABLET | Freq: Two times a day (BID) | ORAL | Status: DC
  Administered 2014-02-09 – 2014-02-10 (×3): 200 mg via ORAL
  Filled 2014-02-09 (×5): qty 1

## 2014-02-09 SURGICAL SUPPLY — 47 items
BAG URINE DRAINAGE (UROLOGICAL SUPPLIES) ×3 IMPLANT
BASKET ZERO TIP NITINOL 2.4FR (BASKET) ×3 IMPLANT
BENZOIN TINCTURE PRP APPL 2/3 (GAUZE/BANDAGES/DRESSINGS) ×6 IMPLANT
BLADE SURG 15 STRL LF DISP TIS (BLADE) ×2 IMPLANT
BLADE SURG 15 STRL SS (BLADE) ×1
CARTRIDGE STONEBREAK CO2 KIDNE (ELECTROSURGICAL) IMPLANT
CATH FOLEY 2W COUNCIL 20FR 5CC (CATHETERS) IMPLANT
CATH FOLEY 2W COUNCIL 5CC 18FR (CATHETERS) ×3 IMPLANT
CATH MULTI PURPOSE 16FR DRAIN (STENTS) ×3 IMPLANT
CATH ROBINSON RED A/P 20FR (CATHETERS) IMPLANT
CATH X-FORCE N30 NEPHROSTOMY (TUBING) ×3 IMPLANT
COVER SURGICAL LIGHT HANDLE (MISCELLANEOUS) ×3 IMPLANT
DRAPE C-ARM 42X120 X-RAY (DRAPES) ×3 IMPLANT
DRAPE CAMERA CLOSED 9X96 (DRAPES) ×3 IMPLANT
DRAPE LINGEMAN PERC (DRAPES) ×3 IMPLANT
DRAPE SURG IRRIG POUCH 19X23 (DRAPES) ×3 IMPLANT
DRSG TEGADERM 8X12 (GAUZE/BANDAGES/DRESSINGS) IMPLANT
FIBER LASER FLEXIVA 200 (UROLOGICAL SUPPLIES) IMPLANT
FIBER LASER FLEXIVA 365 (UROLOGICAL SUPPLIES) IMPLANT
FIBER LASER FLEXIVA 550 (UROLOGICAL SUPPLIES) IMPLANT
GLOVE BIOGEL M 8.0 STRL (GLOVE) ×3 IMPLANT
GOWN STRL REUS W/TWL XL LVL3 (GOWN DISPOSABLE) ×3 IMPLANT
GUIDEWIRE AMPLAZ .035X145 (WIRE) ×6 IMPLANT
KIT BASIN OR (CUSTOM PROCEDURE TRAY) ×3 IMPLANT
LASER FIBER DISP 1000U (UROLOGICAL SUPPLIES) IMPLANT
MANIFOLD NEPTUNE II (INSTRUMENTS) ×3 IMPLANT
NS IRRIG 1000ML POUR BTL (IV SOLUTION) ×3 IMPLANT
PACK BASIC VI WITH GOWN DISP (CUSTOM PROCEDURE TRAY) ×3 IMPLANT
PAD ABD 7.5X8 STRL (GAUZE/BANDAGES/DRESSINGS) IMPLANT
PROBE KIDNEY STONEBRKR 2.0X425 (ELECTROSURGICAL) ×3 IMPLANT
PROBE LITHOCLAST ULTRA 3.8X403 (UROLOGICAL SUPPLIES) IMPLANT
PROBE PNEUMATIC 1.0MMX570MM (UROLOGICAL SUPPLIES) ×3 IMPLANT
SET IRRIG Y TYPE TUR BLADDER L (SET/KITS/TRAYS/PACK) ×3 IMPLANT
SET WARMING FLUID IRRIGATION (MISCELLANEOUS) ×3 IMPLANT
SHEATH PEELAWAY SET 9 (SHEATH) ×3 IMPLANT
SPONGE GAUZE 4X4 12PLY (GAUZE/BANDAGES/DRESSINGS) IMPLANT
SPONGE LAP 4X18 X RAY DECT (DISPOSABLE) ×3 IMPLANT
STONE CATCHER W/TUBE ADAPTER (UROLOGICAL SUPPLIES) ×3 IMPLANT
SUT SILK 2 0 30  PSL (SUTURE) ×1
SUT SILK 2 0 30 PSL (SUTURE) ×2 IMPLANT
SYR 20CC LL (SYRINGE) ×6 IMPLANT
SYRINGE 10CC LL (SYRINGE) ×3 IMPLANT
TRAY FOLEY BAG SILVER LF 14FR (CATHETERS) ×3 IMPLANT
TRAY FOLEY CATH 14FRSI W/METER (CATHETERS) ×3 IMPLANT
TUBE CONNECTING VINYL 14FR 30C (MISCELLANEOUS) ×3 IMPLANT
TUBING CONNECTING 10 (TUBING) ×9 IMPLANT
WATER STERILE IRR 1500ML POUR (IV SOLUTION) ×3 IMPLANT

## 2014-02-09 NOTE — Anesthesia Preprocedure Evaluation (Addendum)
Anesthesia Evaluation  Patient identified by MRN, date of birth, ID band Patient awake    Reviewed: Allergy & Precautions, H&P , NPO status , Patient's Chart, lab work & pertinent test results  Airway Mallampati: II TM Distance: >3 FB Neck ROM: Full    Dental no notable dental hx. (+) Teeth Intact, Dental Advisory Given   Pulmonary neg pulmonary ROS,  breath sounds clear to auscultation  Pulmonary exam normal       Cardiovascular Exercise Tolerance: Good hypertension, Pt. on medications Rhythm:Regular Rate:Normal     Neuro/Psych negative neurological ROS  negative psych ROS   GI/Hepatic negative GI ROS, Neg liver ROS,   Endo/Other  negative endocrine ROS  Renal/GU Renal disease  negative genitourinary   Musculoskeletal negative musculoskeletal ROS (+)   Abdominal   Peds negative pediatric ROS (+)  Hematology negative hematology ROS (+)   Anesthesia Other Findings   Reproductive/Obstetrics negative OB ROS                          Anesthesia Physical Anesthesia Plan  ASA: II  Anesthesia Plan: General   Post-op Pain Management:    Induction: Intravenous  Airway Management Planned: Oral ETT  Additional Equipment:   Intra-op Plan:   Post-operative Plan: Extubation in OR  Informed Consent:   Plan Discussed with: Surgeon  Anesthesia Plan Comments:         Anesthesia Quick Evaluation

## 2014-02-09 NOTE — Procedures (Signed)
R nephroureteral catheter placement No complication No blood loss. See complete dictation in [facility].

## 2014-02-09 NOTE — Care Management Note (Signed)
    Page 1 of 1   02/09/2014     3:55:02 PM CARE MANAGEMENT NOTE 02/09/2014  Patient:  Bryan Frey, Bryan Frey   Account Number:  [id number]  Date Initiated:  02/09/2014  Documentation initiated by:  [facility]  Subjective/Objective Assessment:   68 Y/O M ADMITTED W/R RENAL CALCULI.     Action/Plan:   FROM HOME.HAS PCP,PHARMACY.   Anticipated DC Date:  02/10/2014   Anticipated DC Plan:  [place]  CM consult      Choice offered to / List presented to:             Status of service:  In process, will continue to follow Medicare Important Message given?   (If response is "NO", the following Medicare IM given date fields will be blank) Date Medicare IM given:   Date Additional Medicare IM given:    Discharge Disposition:    Per UR Regulation:  Reviewed for med. necessity/level of care/duration of stay  If discussed at [place] of Stay Meetings, dates discussed:    Comments:  02/09/14 Deloros Beretta RN,BSN NCM [phone] S/P R PCN.NO ANTICIPATED D/C NEEDS.

## 2014-02-09 NOTE — H&P (Signed)
Chief Complaint: "I'm here for a kidney stone" Referring Physician:Dahlstedt HPI: Bryan Frey is an 68 y.o. male with right renal stone. He is scheduled for nephrolithotomy today and needs IR to place (R)percutaneous nephrostomy for access. PMHx reviewed, prior (L)renal cryoablation, doing well. Feels well otherwise, no recent fevers, chills, N/V   Past Medical History:  Past Medical History  Diagnosis Date  . Nephrolithiasis   . High cholesterol   . 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 Hypertension     EKG,  chest  4/13 EPIC  . Anxiety     with diagnosis  . Gall stones   . left renal ca dx'd 03/2012    tumor in kidney .Marland Kitchen    Past Surgical History:  Past Surgical History  Procedure Laterality Date  . Joint replacement  2011    Lt hip  . Lumbar laminectomy/decompression microdiscectomy  02/09/2012    Procedure: LUMBAR LAMINECTOMY/DECOMPRESSION MICRODISCECTOMY 2 LEVELS;  Surgeon: Kristeen Miss, MD;  Location: [place] [facility];  Service: Neurosurgery;  Laterality: Bilateral;  Bilateral Lumbar two-three,lumbar four-five laminectomies    Family History:  Family History  Problem Relation Age of Onset  . Anesthesia problems Neg Hx     Social History:  reports that he has never smoked. He has never used smokeless tobacco. He reports that he does not drink alcohol or use illicit drugs.  Allergies:  Allergies  Allergen Reactions  . Morphine And Related     Hallucinations    Medications:   Medication List    Notice   This visit is during an admission. Changes to the med list made in this visit will be reflected in the After Visit Summary of the admission.      Please HPI for pertinent positives, otherwise complete 10 system ROS negative.  Physical Exam: BP 133/87  Pulse 76  Temp(Src) 96.9 F (36.1 C) (Oral)  Resp 18  SpO2 97% There is no weight on file to calculate BMI.   General Appearance:  Alert, cooperative, no distress, appears stated age  Head:  Normocephalic, without  obvious abnormality, atraumatic  ENT: Unremarkable  Neck: Supple, symmetrical, trachea midline  Lungs:   Clear to auscultation bilaterally, no w/r/r, respirations unlabored without use of accessory muscles.  Chest Wall:  No tenderness or deformity  Heart:  Regular rate and rhythm, S1, S2 normal, no murmur, rub or gallop.  Abdomen:   Soft, non-tender, non distended.  Neurologic: Normal affect, no gross deficits.   Results for orders placed during the hospital encounter of 02/09/14 (from the past 48 hour(s))  TYPE AND SCREEN     Status: None   Collection Time    02/09/14  8:00 AM      Result Value Ref Range   ABO/RH(D) O NEG     Antibody Screen NEG     Sample Expiration 02/12/2014    APTT     Status: None   Collection Time    02/09/14  8:00 AM      Result Value Ref Range   aPTT 29  24 - 37 seconds  BASIC METABOLIC PANEL     Status: Abnormal   Collection Time    02/09/14  8:00 AM      Result Value Ref Range   Sodium 140  137 - 147 mEq/L   Potassium 4.2  3.7 - 5.3 mEq/L   Chloride 100  96 - 112 mEq/L   CO2 26  19 - 32 mEq/L   Glucose, Bld  111 (*) 70 - 99 mg/dL   BUN 17  6 - 23 mg/dL   Creatinine, Ser 1.04  0.50 - 1.35 mg/dL   Calcium 9.0  8.4 - 10.5 mg/dL   GFR calc non Af Amer 71 (*) >90 mL/min   GFR calc Af Amer 83 (*) >90 mL/min   Comment: (NOTE)     The eGFR has been calculated using the CKD EPI equation.     This calculation has not been validated in all clinical situations.     eGFR's persistently <90 mL/min signify possible Chronic Kidney     Disease.  CBC     Status: None   Collection Time    02/09/14  8:00 AM      Result Value Ref Range   WBC 7.1  4.0 - 10.5 K/uL   RBC 4.70  4.22 - 5.81 MIL/uL   Hemoglobin 13.8  13.0 - 17.0 g/dL   HCT 42.0  39.0 - 52.0 %   MCV 89.4  78.0 - 100.0 fL   MCH 29.4  26.0 - 34.0 pg   MCHC 32.9  30.0 - 36.0 g/dL   RDW 13.6  11.5 - 15.5 %   Platelets 269  150 - 400 K/uL  PROTIME-INR     Status: None   Collection Time    02/09/14   8:00 AM      Result Value Ref Range   Prothrombin Time 13.4  11.6 - 15.2 seconds   INR 1.04  0.00 - 1.49   No results found.  Assessment/Plan (R)renal calculus For (R)PCNL today in OR Discussed IR part of procedure for PCN placement with [place] and fluoro guidance. Explained procedure, risks, complications, use of sedation. Labs reviewed. Consent signed in chart  Ascencion Dike PA-C 02/09/2014, 8:57 AM

## 2014-02-09 NOTE — Sedation Documentation (Cosign Needed)
Medication dose calculated and verified for: Versed 5mg , Fentanyl 164mcg, Ancef 2g, 49mins.

## 2014-02-09 NOTE — Anesthesia Postprocedure Evaluation (Signed)
Anesthesia Post Note  Patient: Bryan Frey  Procedure(s) Performed: Procedure(s) (LRB): NEPHROLITHOTOMY PERCUTANEOUS (Right)  Anesthesia type: General  Patient location: PACU  Post pain: Pain level controlled  Post assessment: Post-op Vital signs reviewed  Last Vitals: BP 161/78  Pulse 72  Temp(Src) 36.7 C  Resp 16  Ht 5\' 11"  (1.803 m)  Wt 249 lb 1.9 oz (113 kg)  BMI 34.76 kg/m2  SpO2 99%  Post vital signs: Reviewed  Level of consciousness: sedated  Complications: No apparent anesthesia complications

## 2014-02-09 NOTE — Progress Notes (Signed)
Post-op note  Subjective: The patient is having some nausea. Pain controlled  Objective: Vital signs in last 24 hours: Temp:  [96.9 F (36.1 C)-98 F (36.7 C)] 98 F (36.7 C) (05/07 1438) Pulse Rate:  [61-81] 72 (05/07 1438) Resp:  [10-20] 16 (05/07 1438) BP: (115-172)/(68-109) 161/78 mmHg (05/07 1438) SpO2:  [93 %-100 %] 99 % (05/07 1438) Weight:  [113 kg (249 lb 1.9 oz)] 113 kg (249 lb 1.9 oz) (05/07 1517)  Intake/Output from previous day:   Intake/Output this shift: Total I/O In: 1000 [I.V.:1000] Out: 715 [Urine:715]  Physical Exam:  General: Alert and oriented. Abdomen: Soft, Nondistended. Incisions: Clean and dry.  Lab Results:  Recent Labs  02/09/14 0800  HGB 13.8  HCT 42.0    Assessment/Plan: POD#0   1) Continue to monitor 2) Cath out in am 3) H/H in am   Lillette Boxer. Akansha Wyche, MD   LOS: 0 days   Franchot Gallo 02/09/2014, 4:42 PM

## 2014-02-09 NOTE — Discharge Instructions (Signed)
Percutaneous Nephrolithotomy Kidney stones can cause a great deal of pain. They can block urine from leaving the body. And they can lead to infection. Kidney stones might pass on their own, or they can be broken up by shock waves from a special machine. But sometimes surgery is needed to get rid of kidney stones. One type of surgery is called percutaneous nephrolithotomy. "Nephro" means kidney, "litho" means stone and "tomy" means removal by surgery. "Percutaneous" means through the skin. This type of surgery needs only a small cut (incision) in the skin. This surgery may be suggested for various reasons. They include:  The kidney stones are 2 centimeters wide (about 3/4 inch) or bigger. They also might be oddly shaped.  Other treatments were tried, but all or some of the kidney stones remain.  Infection has developed.  No other treatment can be used. LET YOUR CAREGIVER KNOW ABOUT:   Any allergies.  All medications you are taking, including:  Herbs, eyedrops, over-the-counter medications and creams.  Blood thinners (anticoagulants), aspirin or other drugs that could affect blood clotting.  Use of steroids (by mouth or as creams).  Previous problems with anesthetics, including local anesthetics.  Possibility of pregnancy, if this applies.  Any history of blood clots.  Any history of bleeding or other blood problems.  Previous surgery.  Smoking history.  Other health problems. RISKS AND COMPLICATIONS   During surgery:  Sometimes all the kidney stones cannot be removed through the tube. Then, the percutaneous nephrolithotomy procedure would be stopped. Open surgery would be used to remove the remaining stones.  Short-term risks from the surgery could include:  Excessive bleeding.  Blood in your urine.  Holes in the kidney. These usually heal on their own.  Pain.  Redness or tenderness at the incision site.  Numbness (loss of feeling) in the area treated. Tingling  also is possible.  A pooling of blood in the wound (hematoma).  Infection.  Slow healing.  Longer-term possibilities include:  Kidney damage.  Damage to organs near the kidney.  Need for a repeat surgery. BEFORE THE PROCEDURE  You may need to take some tests before your surgery. These might include:  Blood tests.  Urine tests.  Tests to make sure your heart is working properly.  Let your healthcare provider know if you think you might have a urinary tract infection. You will probably need to take an antibiotic to treat this before the surgery.  Two weeks before your surgery, stop using aspirin and non-steroidal anti-inflammatory drugs (NSAIDs) for pain relief. This includes prescription drugs and over-the-counter drugs such as ibuprofen and naproxen. Also stop taking vitamin E.  If you take blood-thinners, ask your healthcare provider when you should stop taking them.  Do not eat or drink for about 8 hours before your surgery.  You might be asked to shower or wash with a special antibacterial soap before the procedure.  Arrive at least an hour before the surgery, or whenever your surgeon recommends. This will give you time to check in and fill out any needed paperwork. PROCEDURE  The preparation:  You will change into a hospital gown.  You will be given an IV. A needle will be inserted in your arm. Medication will be able to flow directly into your body through this needle.  You might be given a sedative. This medication will help you relax.  You may be given a general anesthetic (a drug that will put you to sleep during the surgery). Or, you may  get a local anesthetic (part of your body will be numb, but you will remain awake).  A catheter (tube) will be put in your bladder to drain urine during and after surgery.  The procedure:  The surgeon will make a small incision in your lower back.  A tube will be inserted through the incision into your kidney.  Each  kidney stone is removed through this tube. Larger stones may first be broken up with a laser (a high-intensity light beam) or other tools.  If a kidney stone has already left the kidney, the surgeon would use a special tool to bring it back in. Then it would be removed through the tube.  After all the stones are taken out, a catheter will be put in. Fluid can build up around the kidney as it heals. The catheter lets this fluid drain out of the body.  A dressing (medicine and bandage) will be put on the incision area.  The surgery usually takes three to four hours. AFTER THE PROCEDURE  You will stay in a recovery area until the anesthesia has worn off. Your blood pressure and pulse will be checked often. You might be given more pain medication.  You will be moved to a hospital room for the rest of your stay.  The catheter that is taking urine out of your body will be taken out within 24 hours.  The day after your surgery, you should be able to walk around. Walking helps prevent blood clots (thick clumps that can block the flow of blood).  You may be asked to do some breathing exercises.  You will be able to have only liquids for a day or two.  Before you go home:  The catheter that is draining fluid from the kidney area is usually taken out.  You will be taught how to care for the incision. Be sure to ask how often the dressing should be changed and when it can get wet.  You also will be told what you should and should not do while your kidney and incisions heal. For instance, you may be urged to walk to prevent blood clots. Document Released: 07/20/2009 Document Revised: 12/15/2011 Document Reviewed: 07/20/2009 [facility] Patient Information 2014 [place], [place].

## 2014-02-09 NOTE — Transfer of Care (Signed)
Immediate Anesthesia Transfer of Care Note  Patient: Bryan Frey  Procedure(s) Performed: Procedure(s): NEPHROLITHOTOMY PERCUTANEOUS (Right)  Patient Location: PACU  Anesthesia Type:General  Level of Consciousness: sedated  Airway & Oxygen Therapy: Patient Spontanous Breathing and Patient connected to face mask oxygen  Post-op Assessment: Report given to PACU RN and Post -op Vital signs reviewed and stable  Post vital signs: Reviewed and stable  Complications: No apparent anesthesia complications

## 2014-02-09 NOTE — Op Note (Signed)
Preoperative diagnosis: Right renal calculi, with dominant stone 15 mm in size  Postoperative diagnosis: Same   Procedure: Right percutaneous nephrolithotomy, aggregate diameter of stones about 20 mm, antegrade nephrostogram    Surgeon: Lillette Boxer. Rod Majerus, M.D.   Anesthesia: Gen.   Complications: None  Specimen(s): Stones, the patient's wife  Drain(s): 31 [place] council tip catheter as nephrostomy tube, 56 French Foley catheter draining bladder  Indications: 68 year old male with enlarging right renal calculi. He has a history of renal cell carcinoma the left kidney and has had cryoablation percutaneously by Dr. Kathlene Cote. In followup, the patient has had enlarging right renal calculi but, that despite being asymptomatic required treatment due to their growth. Because of the size and location of the stones, I recommended percutaneous nephrolithotomy. The procedure, risks and complications have been discussed with the patient. He understands these and desires to proceed.    Technique and findings: The patient had a nephrostomy tube/access placed by Dr. Arne Cleveland in interventional radiology prior to the procedure. I met him in the holding area, and properly marked his right side. He had received 2 g of Ancef prior to the interventional procedure. He was then taken to the operating room where general anesthetic was administered with the endotracheal device. Lateral strain with a 16 French Foley catheter. He was then placed in the prone position on the operating table. All extremities were padded appropriately. SCDs were placed in the patient's lower extremities. His right flank was then prepped and draped around the nephrostomy tube. Proper timeout was performed.  I then passed a guidewire through the Kumpe catheter which was all the way into the bladder through the right ureter. The Kumpe catheter was removed following adequate placement of the guidewire under fluoroscopic guidance. I then  passed a peel-away sheath over the guidewire, and after adequate positioning was seen fluoroscopically, passed a second guidewire into the bladder. The peel-away sheath was then removed. I then passed the NephroMax balloon over top of the working wire, and guided this up to the stone in the renal pelvis which was visualized fluoroscopically. The balloon was inflated to 18 atmospheres of pressure for approximately 2 minutes. The nephrostomy sheath was then placed over top of the inflated balloon once the skin incision had been made. I then advanced the nephrostomy sheath to the stone using fluoroscopic guidance. The balloon was then deflated and removed.  I placed the rigid nephroscope through the sheath up to the renal pelvis. A fair amount of clot was removed with grasping forceps. The larger stone was then easily identified and removed. A couple of small stones were in the renal pelvis, at the ureteropelvic junction, and removed with grasping forceps as well. I could not access the calyces with the rigid nephroscope, as the patient's kidney was quite deep as well as the sheath. I then passed the cystoscope through the sheath into the renal pelvis. I could not negotiate into any calyces. The sheath had to be repositioned once over top of the NephroMax balloon again. I then used the digital ureteroscope to access all the calyces. 3-4 small stones were seen underneath the urothelium of the papillae. I tried picking at these with the Nitinol basket. I was unable to dislodge any the stones, despite multiple attempts on most of the stones. It was felt that these were not readily accessed, and I left them in situ. Once all the calyces were inspected, as well as the proximal ureter with the flexible ureteroscope, I felt the procedure had  reached its maximum benefit. I then removed the ureteroscope, and passed first a 16 French pigtail catheter over the working guidewire. I was unable to negotiated into an adequate  position, so instead I placed an 18 French council tip catheter over the working guidewire. This was left with the tip in the pelvis area an antegrade nephrostogram revealed no significant extravasation, and contrast readily progressed down the ureter without evidence of any extravasation. I filled the balloon with approximately 3 cc of water. It was then sutured to the skin. The guidewires were removed.  Following suturing the nephrostomy tube to the skin, skin edges reapproximated using 2-0 silk placed in a vertical mattress fashion in 2 places, and I then placed the dry sterile dressing. The nephrostomy tube was hooked to dependent drainage.  The patient tolerated the procedure well. He was awakened and then taken to the PACU in stable condition.  I would say the patient lost approximately 100 cc of blood during the procedure.        

## 2014-02-10 ENCOUNTER — Encounter (HOSPITAL_COMMUNITY): Payer: Self-pay | Admitting: Urology

## 2014-02-10 LAB — HEMOGLOBIN AND HEMATOCRIT, BLOOD
HEMATOCRIT: 39.4 % (ref 39.0–52.0)
HEMOGLOBIN: 12.9 g/dL — AB (ref 13.0–17.0)

## 2014-02-10 MED ORDER — DSS 100 MG PO CAPS: 100.0000 mg | ORAL_CAPSULE | Freq: Two times a day (BID) | ORAL | Status: DC

## 2014-02-10 MED ORDER — CIPROFLOXACIN HCL 500 MG PO TABS: 500.0000 mg | ORAL_TABLET | Freq: Two times a day (BID) | ORAL | Status: DC

## 2014-02-10 MED ORDER — PROMETHAZINE HCL 25 MG/ML IJ SOLN
12.5000 mg | Freq: Four times a day (QID) | INTRAMUSCULAR | Status: DC | PRN
  Administered 2014-02-10: 12.5 mg via INTRAVENOUS
  Filled 2014-02-10: qty 1

## 2014-02-10 MED ORDER — ACETAMINOPHEN 10 MG/ML IV SOLN
1000.0000 mg | Freq: Four times a day (QID) | INTRAVENOUS | Status: AC
  Administered 2014-02-10 – 2014-02-11 (×4): 1000 mg via INTRAVENOUS
  Filled 2014-02-10 (×4): qty 100

## 2014-02-10 MED ORDER — SODIUM CHLORIDE 0.9 % IV BOLUS (SEPSIS)
500.0000 mL | Freq: Once | INTRAVENOUS | Status: AC
  Administered 2014-02-10: 500 mL via INTRAVENOUS

## 2014-02-10 NOTE — Progress Notes (Signed)
Resumed care of patient.  Pt wife at bedside.  Pt nausea relieved at this time.  Pt calm and resting.  No changes in pt assessment.  Will continue to monitor.

## 2014-02-10 NOTE — Discharge Instructions (Signed)
DISCHARGE INSTRUCTIONS FOR PERCUTANEOUS STONE SURGERY MEDICATIONS:  1. DO NOT RESUME YOUR IBUPROFEN, or any other medicines like aspirin, motrin, excedrin, advil, aleve, vitamin E, fish oil as these can all cause bleeding x 10 days.  2. Resume all your other meds from home - except do not take any other pain meds that you may have at home.  ACTIVITY 1. No strenuous activity, sexual activity, or lifting greater than 10 pounds for 2 weeks. 2. No driving while on narcotic pain medications 3. Drink plenty of water 4. Continue to walk at home - you can still get blood clots when you are at home, so keep active, but don't over do it. 5. May return to work in 1 week (but not heavy or strenuous activity).   BATHING You can shower.  Cover your wound with a dressing and remove the dressing immediately after the shower.  Do not submerge wound under water.   WOUND CARE Your wound will drain bloody fluid and may do so for 7-14 days. You have 2 options for dressings:  1. You may use gauze and tape to dress your wound.  If you choose this method, then change the dressing as it becomes soaked.  Change it at least once daily until it stops draining. You may switch to a Band Aid once drainage stops. 2. If drainage is copious, you may use an ostomy device.  This is a bag with an andhesive circle.  The circle has a hole in the middle of it and you cut the hole to the size needed to fit the wound.  This will collect the drainage in the bag and allow you to drain the bag as needed.   SIGNS/SYMPTOMS TO CALL: 1. Please call us if you have a fever greater than 101.5, uncontrolled nausea/vomiting, uncontrolled pain, dizziness, unable to urinate, bloody urine, chest pain, shortness of breath, leg swelling, leg pain, redness around wound, drainage from wound, or any other concerns or questions. 2. You can reach [place] at [phone]. FOLLOW-UP Keep scheduled followup

## 2014-02-10 NOTE — Progress Notes (Signed)
Pt aaox3.  Pt n/v at this time.  Pt wants to await d/c foley until nausea resolves.

## 2014-02-10 NOTE — Progress Notes (Addendum)
Md made aware of patient n/v.  Anders Grant, NP made aware of pt emesis appearance of possibly bile.  New orders given.  Pt to see rounding md this morning.  Will continue to monitor.

## 2014-02-10 NOTE — Progress Notes (Signed)
1 Day Post-Op Subjective: Patient reports persistent n/v. ? From dilaudid. Not much pain  Objective: Vital signs in last 24 hours: Temp:  [97.6 F (36.4 C)-98.5 F (36.9 C)] 98.5 F (36.9 C) (05/08 3710) Pulse Rate:  [61-81] 78 (05/08 0608) Resp:  [10-20] 18 (05/08 0608) BP: (115-172)/(60-109) 140/63 mmHg (05/08 0608) SpO2:  [93 %-100 %] 96 % (05/08 0608) Weight:  [113 kg (249 lb 1.9 oz)] 113 kg (249 lb 1.9 oz) (05/07 1517)  Intake/Output from previous day: 05/07 0701 - 05/08 0700 In: 1300 [I.V.:1300] Out: 1140 [Urine:1140] Intake/Output this shift:    Physical Exam:  Constitutional: Vital signs reviewed. WD WN in NAD   Eyes: PERRL, No scleral icterus.   Cardiovascular: RRR Pulmonary/Chest: Normal effort Abdominal: Soft. Non-tender, non-distended, bowel sounds are normal, no masses, organomegaly, or guarding present.Dressing dry. Lab Results:  Recent Labs  02/09/14 0800 02/10/14 0502  HGB 13.8 12.9*  HCT 42.0 39.4   BMET  Recent Labs  02/09/14 0800  NA 140  K 4.2  CL 100  CO2 26  GLUCOSE 111*  BUN 17  CREATININE 1.04  CALCIUM 9.0    Recent Labs  02/09/14 0800  INR 1.04   No results found for this basename: LABURIN,  in the last 72 hours Results for orders placed during the hospital encounter of 02/03/12  SURGICAL PCR SCREEN     Status: None   Collection Time    02/03/12  9:50 AM      Result Value Ref Range Status   MRSA, PCR NEGATIVE  NEGATIVE Final   Staphylococcus aureus NEGATIVE  NEGATIVE Final   Comment:            The Xpert SA Assay (FDA     approved for NASAL specimens     only), is one component of     a comprehensive surveillance     program.  It is not intended     to diagnose infection nor to     guide or monitor treatment.    Studies/Results: Ir US Guide Bx Asp/drain  02/09/2014   CLINICAL DATA:  Symptomatic right nephrolithiasis  EXAM: RIGHT PERCUTANEOUS NEPHROURETERAL CATHETER PLACEMENT UNDER ULTRASOUND AND FLUOROSCOPIC  GUIDANCE  FLUOROSCOPY TIME:  16MIN 0 seconds  TECHNIQUE: The procedure, risks (including but not limited to bleeding, infection, organ damage ), benefits, and alternatives were explained to the patient. Questions regarding the procedure were encouraged and answered. The patient understands and consents to the procedure.  Posterior rightFlank region prepped with Betadine, draped in usual sterile fashion, infiltrated locally with 1% lidocaine.As antibiotic prophylaxis, cefazolin 2 g was ordered pre-procedure and administered intravenously within one hour of incision.  Intravenous Fentanyl and Versed were administered as conscious sedation during continuous cardiorespiratory monitoring by the radiology RN, with a total moderate sedation time of 50 minutes.  Initially a 21 gauge trocar needle was advanced to a calculus in the lower pole right renal collecting system. The collecting system was shown on ultrasound to be decompressed. A 018 guidewire would not advance centrally easily. A false passage was created, which was demonstrated with a small contrast injection. Attempts were made to approach calculi in the collecting system under ultrasound but there was inadequate visualization for approach to the decompressed collecting system. Ultimately using a 21 gauge Chiba needle a posterior lower pole calyx containing a small stone was approached. A 018 guidewire advanced down the ureter. #51F dilator was placed to allow small contrast injection confirming appropriate positioning. The dilator was  exchanged for the transitional dilator, which allow passage of a 035 wire down to the urinary bladder. Over this, a 5 [place] Kumpe the catheter was placed, coilied distally in the urinary bladder. Catheter was capped externally and secured. The patient tolerated the procedure well.  No immediate complication.  IMPRESSION: 1. Technically successful right percutaneous nephroureteral catheter placement in preparation for  nephrolithotomy.   Electronically Signed   By: Arne Cleveland M.D.   On: 02/09/2014 13:03   Dg C-arm 61-120 Min-no Report  02/09/2014   CLINICAL DATA: perc   C-ARM 61-120 MINUTES  Fluoroscopy was utilized by the requesting physician.  No radiographic  interpretation.    Ir Oris Drone Cath Perc Right  02/09/2014   CLINICAL DATA:  Symptomatic right nephrolithiasis  EXAM: RIGHT PERCUTANEOUS NEPHROURETERAL CATHETER PLACEMENT UNDER ULTRASOUND AND FLUOROSCOPIC GUIDANCE  FLUOROSCOPY TIME:  16MIN 0 seconds  TECHNIQUE: The procedure, risks (including but not limited to bleeding, infection, organ damage ), benefits, and alternatives were explained to the patient. Questions regarding the procedure were encouraged and answered. The patient understands and consents to the procedure.  Posterior rightFlank region prepped with Betadine, draped in usual sterile fashion, infiltrated locally with 1% lidocaine.As antibiotic prophylaxis, cefazolin 2 g was ordered pre-procedure and administered intravenously within one hour of incision.  Intravenous Fentanyl and Versed were administered as conscious sedation during continuous cardiorespiratory monitoring by the radiology RN, with a total moderate sedation time of 50 minutes.  Initially a 21 gauge trocar needle was advanced to a calculus in the lower pole right renal collecting system. The collecting system was shown on ultrasound to be decompressed. A 018 guidewire would not advance centrally easily. A false passage was created, which was demonstrated with a small contrast injection. Attempts were made to approach calculi in the collecting system under ultrasound but there was inadequate visualization for approach to the decompressed collecting system. Ultimately using a 21 gauge Chiba needle a posterior lower pole calyx containing a small stone was approached. A 018 guidewire advanced down the ureter. #33F dilator was placed to allow small contrast injection confirming appropriate  positioning. The dilator was exchanged for the transitional dilator, which allow passage of a 035 wire down to the urinary bladder. Over this, a 5 [place] Kumpe the catheter was placed, coilied distally in the urinary bladder. Catheter was capped externally and secured. The patient tolerated the procedure well.  No immediate complication.  IMPRESSION: 1. Technically successful right percutaneous nephroureteral catheter placement in preparation for nephrolithotomy.   Electronically Signed   By: Arne Cleveland M.D.   On: 02/09/2014 13:03    Assessment/Plan:   POD 1 right pcnl. H/H stable. Main problem is n/v.   Will d/c dilaudid. Have added iv tylenol.   LOS: 1 day   Franchot Gallo 02/10/2014, 7:48 AM

## 2014-02-10 NOTE — Progress Notes (Signed)
UR completed 

## 2014-02-11 MED ORDER — SENNOSIDES-DOCUSATE SODIUM 8.6-50 MG PO TABS: 1.0000 | ORAL_TABLET | Freq: Two times a day (BID) | ORAL | Status: DC

## 2014-02-11 MED ORDER — TRAMADOL HCL 50 MG PO TABS: 50.0000 mg | ORAL_TABLET | Freq: Four times a day (QID) | ORAL | Status: DC | PRN

## 2014-02-11 NOTE — Discharge Summary (Signed)
Physician Discharge Summary  Patient ID: Bryan Frey MRN: [id number] DOB/AGE: [birth year] 69 y.o.  Admit date: 02/09/2014 Discharge date: 02/11/2014  Admission Diagnoses: Rt Large Renal Stone  Discharge Diagnoses:  Active Problems:   Calculus of kidney   Discharged Condition: good  Hospital Course: Pt underwent Rt percutatneous nephrostolithyotomy on 5/7, the day of admission, without acute complications. He was admitted to the Urology service post-op where he began his vigorous recovery. His nephrostomy was removed 5/8. By 5/9, the day of discharge, pt ambulatory, tolerating PO intake, pain controlled, no wound problems, and felt to be adequate for discharge.  Consults: None  Significant Diagnostic Studies: none  Treatments: surgery: as per above  Discharge Exam: Blood pressure 117/66, pulse 73, temperature 97.5 F (36.4 C), temperature source Oral, resp. rate 19, height 5\' 11"  (1.803 m), weight 113 kg (249 lb 1.9 oz), SpO2 95.00%. General appearance: alert, cooperative and appears stated age Head: Normocephalic, without obvious abnormality, atraumatic Throat: lips, mucosa, and tongue normal; teeth and gums normal Neck: supple, symmetrical, trachea midline Back: symmetric, no curvature. ROM normal. No CVA tenderness. Resp: No labored breathing Chest wall: no tenderness Cardio: regular rate and rhythm, S1, S2 normal, no murmur, click, rub or gallop GI: soft, non-tender; bowel sounds normal; no masses,  no organomegaly Male genitalia: normal Extremities: extremities normal, atraumatic, no cyanosis or edema Pulses: 2+ and symmetric Skin: Skin color, texture, turgor normal. No rashes or lesions Neurologic: Grossly normal Incision/Wound: Recent Rt PCNL site with dry dressing. No continuous drainage, pus, or erythema.  Disposition: 01-Home or Self Care     Medication List         amLODipine 10 MG tablet  Commonly known as:  NORVASC  Take 10 mg by mouth every evening.      aspirin EC 81 MG tablet  Take 81 mg by mouth daily.     carbamazepine 200 MG tablet  Commonly known as:  TEGRETOL  Take 200 mg by mouth 2 (two) times daily.     ciprofloxacin 500 MG tablet  Commonly known as:  CIPRO  Take 1 tablet (500 mg total) by mouth 2 (two) times daily.     DSS 100 MG Caps  Take 100 mg by mouth 2 (two) times daily.     hydrochlorothiazide 25 MG tablet  Commonly known as:  HYDRODIURIL  Take 25 mg by mouth every evening.     multivitamin with minerals Tabs tablet  Take 1 tablet by mouth daily.     naproxen sodium 220 MG tablet  Commonly known as:  ANAPROX  Take 220 mg by mouth 2 (two) times daily with a meal.     senna-docusate 8.6-50 MG per tablet  Commonly known as:  Senokot-S  Take 1 tablet by mouth 2 (two) times daily. While taking pain meds to prevent constipation     tamsulosin 0.4 MG Caps capsule  Commonly known as:  FLOMAX  Take 0.4 mg by mouth daily.     traMADol 50 MG tablet  Commonly known as:  ULTRAM  Take 1 tablet (50 mg total) by mouth every 6 (six) hours as needed for moderate pain. For pain           Follow-up Information   Follow up with DAHLSTEDT, Lillette Boxer, MD. (As scheduled)    Specialty:  Urology   Contact information:   [facility]  [place] [address] [phone]       Signed: Alexis Frock 02/11/2014,  7:24 AM

## 2014-03-01 ENCOUNTER — Other Ambulatory Visit (HOSPITAL_COMMUNITY): Payer: Self-pay | Admitting: Interventional Radiology

## 2014-03-01 DIAGNOSIS — N2889 Other specified disorders of kidney and ureter: Secondary | ICD-10-CM

## 2014-03-14 ENCOUNTER — Ambulatory Visit (HOSPITAL_COMMUNITY)
Admission: RE | Admit: 2014-03-14 | Discharge: 2014-03-14 | Disposition: A | Discharge status: Home / Self Care | Payer: Medicare Other | Source: Ambulatory Visit | Attending: Interventional Radiology | Admitting: Interventional Radiology

## 2014-03-14 ENCOUNTER — Ambulatory Visit
Admission: RE | Admit: 2014-03-14 | Discharge: 2014-03-14 | Disposition: A | Discharge status: Home / Self Care | Payer: Medicare Other | Source: Ambulatory Visit | Attending: Interventional Radiology | Admitting: Interventional Radiology

## 2014-03-14 DIAGNOSIS — Z85528 Personal history of other malignant neoplasm of kidney: Secondary | ICD-10-CM | POA: Insufficient documentation

## 2014-03-14 DIAGNOSIS — N2889 Other specified disorders of kidney and ureter: Secondary | ICD-10-CM

## 2014-03-14 DIAGNOSIS — N200 Calculus of kidney: Secondary | ICD-10-CM | POA: Insufficient documentation

## 2014-03-14 DIAGNOSIS — N289 Disorder of kidney and ureter, unspecified: Secondary | ICD-10-CM | POA: Insufficient documentation

## 2014-03-14 DIAGNOSIS — K7689 Other specified diseases of liver: Secondary | ICD-10-CM | POA: Insufficient documentation

## 2014-03-14 DIAGNOSIS — M479 Spondylosis, unspecified: Secondary | ICD-10-CM | POA: Insufficient documentation

## 2014-03-14 DIAGNOSIS — K8020 Calculus of gallbladder without cholecystitis without obstruction: Secondary | ICD-10-CM | POA: Insufficient documentation

## 2014-03-14 DIAGNOSIS — J9819 Other pulmonary collapse: Secondary | ICD-10-CM | POA: Insufficient documentation

## 2014-03-14 MED ORDER — IOHEXOL 300 MG/ML  SOLN
100.0000 mL | Freq: Once | INTRAMUSCULAR | Status: AC | PRN
  Administered 2014-03-14: 100 mL via INTRAVENOUS

## 2014-03-14 NOTE — Progress Notes (Signed)
YRLY F/U RENAL [facility]YO//S/P LITHO 02-09-14 FROM [facility] W/ DR Diona Fanti- NEXT APPT IS 04-19-14

## 2014-06-26 ENCOUNTER — Ambulatory Visit: Payer: Self-pay | Admitting: General Practice

## 2014-06-26 LAB — URINALYSIS, COMPLETE
BLOOD: NEGATIVE
Bacteria: NONE SEEN
Bilirubin,UR: NEGATIVE
Glucose,UR: NEGATIVE mg/dL (ref 0–75)
KETONE: NEGATIVE
LEUKOCYTE ESTERASE: NEGATIVE
Nitrite: NEGATIVE
Ph: 5 (ref 4.5–8.0)
Protein: NEGATIVE
SPECIFIC GRAVITY: 1.02 (ref 1.003–1.030)
Squamous Epithelial: NONE SEEN

## 2014-06-26 LAB — CBC
HCT: 42.7 % (ref 40.0–52.0)
HGB: 13.7 g/dL (ref 13.0–18.0)
MCH: 28.9 pg (ref 26.0–34.0)
MCHC: 32 g/dL (ref 32.0–36.0)
MCV: 90 fL (ref 80–100)
Platelet: 286 10*3/uL (ref 150–440)
RBC: 4.73 10*6/uL (ref 4.40–5.90)
RDW: 13.7 % (ref 11.5–14.5)
WBC: 5.7 10*3/uL (ref 3.8–10.6)

## 2014-06-26 LAB — BASIC METABOLIC PANEL
ANION GAP: 7 (ref 7–16)
BUN: 16 mg/dL (ref 7–18)
CALCIUM: 8.7 mg/dL (ref 8.5–10.1)
CO2: 30 mmol/L (ref 21–32)
Chloride: 104 mmol/L (ref 98–107)
Creatinine: 1.11 mg/dL (ref 0.60–1.30)
EGFR (Non-African Amer.): >60
Glucose: 104 mg/dL — ABNORMAL HIGH (ref 65–99)
Osmolality: 283 (ref 275–301)
POTASSIUM: 3.8 mmol/L (ref 3.5–5.1)
Sodium: 141 mmol/L (ref 136–145)

## 2014-06-26 LAB — PROTIME-INR
INR: 1
Prothrombin Time: 13.2 secs (ref 11.5–14.7)

## 2014-06-26 LAB — APTT: Activated PTT: 28.6 secs (ref 23.6–35.9)

## 2014-06-26 LAB — MRSA PCR SCREENING

## 2014-06-26 LAB — SEDIMENTATION RATE: ERYTHROCYTE SED RATE: 19 mm/h (ref 0–20)

## 2014-06-27 LAB — URINE CULTURE

## 2014-07-10 ENCOUNTER — Inpatient Hospital Stay: Payer: Self-pay | Admitting: General Practice

## 2014-07-11 LAB — BASIC METABOLIC PANEL
Anion Gap: 3 — ABNORMAL LOW (ref 7–16)
BUN: 11 mg/dL (ref 7–18)
CREATININE: 1.21 mg/dL (ref 0.60–1.30)
Calcium, Total: 7.3 mg/dL — ABNORMAL LOW (ref 8.5–10.1)
Chloride: 105 mmol/L (ref 98–107)
Co2: 30 mmol/L (ref 21–32)
EGFR (African American): >60
EGFR (Non-African Amer.): >60
GLUCOSE: 109 mg/dL — AB (ref 65–99)
Osmolality: 276 (ref 275–301)
POTASSIUM: 3.6 mmol/L (ref 3.5–5.1)
Sodium: 138 mmol/L (ref 136–145)

## 2014-07-11 LAB — PLATELET COUNT: PLATELETS: 204 10*3/uL (ref 150–440)

## 2014-07-11 LAB — HEMOGLOBIN: HGB: 10.6 g/dL — AB (ref 13.0–18.0)

## 2014-07-12 LAB — BASIC METABOLIC PANEL
ANION GAP: 4 — AB (ref 7–16)
BUN: 7 mg/dL (ref 7–18)
CREATININE: 0.97 mg/dL (ref 0.60–1.30)
Calcium, Total: 7.6 mg/dL — ABNORMAL LOW (ref 8.5–10.1)
Chloride: 108 mmol/L — ABNORMAL HIGH (ref 98–107)
Co2: 29 mmol/L (ref 21–32)
EGFR (African American): >60
EGFR (Non-African Amer.): >60
GLUCOSE: 115 mg/dL — AB (ref 65–99)
OSMOLALITY: 280 (ref 275–301)
Potassium: 3.5 mmol/L (ref 3.5–5.1)
Sodium: 141 mmol/L (ref 136–145)

## 2014-07-12 LAB — HEMOGLOBIN: HGB: 10.5 g/dL — ABNORMAL LOW (ref 13.0–18.0)

## 2014-07-12 LAB — PLATELET COUNT: Platelet: 195 10*3/uL (ref 150–440)

## 2014-07-13 HISTORY — PX: OTHER SURGICAL HISTORY: SHX169

## 2014-07-13 LAB — PATHOLOGY REPORT

## 2015-01-23 ENCOUNTER — Other Ambulatory Visit (HOSPITAL_COMMUNITY): Payer: Self-pay | Admitting: Interventional Radiology

## 2015-01-23 ENCOUNTER — Other Ambulatory Visit: Payer: Self-pay | Admitting: *Deleted

## 2015-01-23 DIAGNOSIS — C642 Malignant neoplasm of left kidney, except renal pelvis: Secondary | ICD-10-CM

## 2015-01-27 NOTE — Discharge Summary (Signed)
PATIENT NAME:  Bryan Frey, Bryan Frey MR#:  [id number] DATE OF BIRTH:  Feb 08, 1946  DATE OF ADMISSION:  07/10/2014 DATE OF DISCHARGE:  07/13/2014  ADMITTING DIAGNOSIS: Degenerative arthrosis of the right hip.   DISCHARGE DIAGNOSIS: Degenerative arthrosis of the right hip.   HISTORY OF PRESENT ILLNESS: The patient is a pleasant, 69 year old, who has been followed at [facility] for progression of right hip discomfort. The patient had a long history of right hip and groin pain. He had appreciated an increase in his right groin pain especially with weight-bearing activities. He was noted to have pain with arising from a sitting position. He had denied any significant left groin pain. He had also appreciated progressive decrease in right hip range of motion. The patient is going to use crutches for ambulation. The patient does have a history of back pathology. X-rays taken in [facility] showed significant narrowing of the cartilage space with bone-on-bone articulation being noted. Subchondral sclerosis as well as osteophyte formation and subchondral cyst formation was noted. Good mineralization of the bone was appreciated. After discussion of the risks and benefits of surgical intervention, the patient expressed his understanding of the risks and benefits and agreed for plans for surgical intervention.   PROCEDURE: Right total hip arthroplasty.    ANESTHESIA: Spinal.   IMPLANTS UTILIZED: DePuy 13.5 mm small stature AML femoral stem, 50 mm outer diameter Pinnacle 100 acetabular shell, +4 mm neutral Pinnacle Marathon polyethylene liner, and a 36 mm outer diameter M-Spec femoral head with a +1.5 mm neck length.   HOSPITAL COURSE: The patient tolerated the procedure very well. He had no complications. He was then taken to the PACU where he was stabilized and then transferred to the orthopedic floor. The patient began receiving anticoagulation therapy with Lovenox 30 mg subcutaneously q.12 h. per  anesthesia and pharmacy protocol. He was fitted with TED stockings bilaterally. These were allowed to be removed 1 hour per 8 hour shift. The patient was also fitted with the AV-I compression foot pumps bilaterally set at 80 mmHg. His calves have been nontender. There has been no tenderness to the calves. They have Homans signs. Heels were elevated off the bed using rolled towels.   The patient has denied any chest pain or shortness of breath. Vital signs have been stable. He has been afebrile. Hemodynamically he is stable. No transfusions were given.   Physical therapy was initiated on day 1 for gait training and transfers. He has done very well. Upon being discharged, he was ambulating greater than 200 feet. He was able go up and down 4 sets of steps. He was independent with bed to chair transfers. Occupational therapy was also initiated on day 1 for ADLs and assistive devices. Overall, this has been an unremarkable hospital course.   The patient's IV, Foley and Hemovac were discontinued on day 2 along with a dressing change. The wound was free of any drainage or signs of infection.   DISPOSITION: The patient is being discharged to home in improved stable condition.   DISCHARGE INSTRUCTIONS: May weight-bear as tolerated. Elevate the lower extremities. Elevate the heels off the bed. Instructed on posterior hip precautions. Continue with TED stockings. These are to be worn during the day, but may be removed at night. Recommend continue incentive spirometer q.1 h. while awake and encourage cough and deep breathing q.2 h. while awake. He is placed on a regular diet. He was instructed on wound care. He is not to get the incision wet until  the staples are removed on October 19. He has a follow-up appointment in the [facility] on November 17 at 9:15. He is to call the clinic sooner for any temperature of 101.5 or greater, or excessive bleeding. The patient will receive home health PT and OT as a directed.  He will continue to use a walker until cleared by physical therapy to go to a quad cane.   DISCHARGE MEDICATIONS: The patient was given a prescription for Lovenox 40 mg subcutaneously daily for 14 days, then discontinue and begin taking one 81 mg enteric-coated aspirin unless contraindicated. Also a prescription for Roxicodone 5 to 10 mg q.4 to 6h. and Tramadol 50 to 100 mg q.4 to 6 h. p.r.n. for pain.   PAST MEDICAL HISTORY: Hypertension, spinal stenosis, hyperlipidemia, erectile dysfunction.    ____________________________ Vance Peper, PA [id number] D: 07/13/2014 07:59:27 ET T: 07/13/2014 12:30:00 ET JOB#: [id number]  cc: Vance Peper, PA, <Dictator> Katha Kuehne PA ELECTRONICALLY SIGNED 07/18/2014 8:11

## 2015-01-27 NOTE — Op Note (Signed)
PATIENT NAME:  Bryan Frey, Bryan Frey MR#:  [id number] DATE OF BIRTH:  [birth year]  DATE OF PROCEDURE:  07/10/2014  PREOPERATIVE DIAGNOSIS: Degenerative arthrosis of the right hip.   POSTOPERATIVE DIAGNOSIS: Degenerative arthrosis of the right hip.   PROCEDURE PERFORMED: Right total hip arthroplasty.   SURGEON: Skip Estimable, MD   ASSISTANT: Vance Peper, PA (required to maintain retraction throughout the procedure).   ANESTHESIA: Spinal.   ESTIMATED BLOOD LOSS: 150 mL.   FLUIDS REPLACED: 1800 mL of crystalloid.   DRAINS: Two medium drains to a Hemovac reservoir.   IMPLANTS UTILIZED: DePuy 13.5 mm small stature AML femoral stem, 60 mm outer diameter Pinnacle 100 acetabular shell, +4 mm neutral Pinnacle Marathon polyethylene liner, and a 36 mm outer diameter M-SPEC femoral head with a +1.5 mm neck length.   INDICATIONS FOR SURGERY: The patient is a 69 year old male who has been seen for complaints of progressive right hip and groin pain. X-rays demonstrated severe degenerative changes with prominent periosteal osteophytes and bone-on-bone articulation noted superiorly. After discussion of the risks and benefits of surgical intervention, the patient expressed understanding of the risks and benefits, and agreed with plans for surgical intervention.   PROCEDURE IN DETAIL: The patient was brought into the operating room and after adequate spinal anesthesia was achieved, the patient was placed in a left lateral decubitus position. Axillary roll was placed and all bony prominences were well padded. The patient's right hip and leg were cleaned and prepped with alcohol and DuraPrep and draped in the usual sterile fashion. A "timeout" was performed as per usual protocol. A lateral curvilinear incision was made gently curving towards the posterior superior iliac spine. IT band was incised in line with the skin incisions. Fibers of the gluteus maximus were split in line. The piriformis tendon was identified,  skeletonized, incised at its insertion on the proximal femur and reflected posteriorly. In a similar fashion, the short external rotators were incised and reflected posteriorly. A T-type posterior capsulotomy was performed. Prior to dislocation of the femoral head, a threaded Steinmann pin was inserted through a separate stab incision into the pelvis superior to the acetabulum and bent in the form of a stylus so as to assess limb length and hip offset throughout the procedure. The hip was then dislocated posteriorly. Inspection of the femoral head demonstrated severe degenerative changes. The femoral neck cut was performed using an oscillating saw. The anterior capsule was elevated off of the femoral neck.  Inspection of the acetabulum also demonstrates severe degenerative changes.  Remnant of the labrum was excised. The acetabulum was reamed in a sequential fashion up to a 59 mm diameter. This allowed for good punctate bleeding bone. A 60 mm Pinnacle 100 acetabular shell was positioned and impacted into place. Excellent scratch fit was appreciated. A+ 4 mm neutral polyethylene insert trial was placed, and attention was directed to the proximal femur. Pilot hole for reaming of the proximal femoral canal was created using a high-speed bur. Proximal femoral canal was reamed in a sequential fashion up to a 13 mm diameter. This allowed for more than 7 cm of scratch fit. It was thus elected to ream up to a 13.5 mm diameter so as to allow line to line contact. The proximal femur was then prepared using a 13.5 mm aggressive side-biting reamer. Serial broaches were inserted up to a 13.5 mm small stature broach. The calcar region was planed accordingly and trial reduction was performed with a 36 mm hip ball with a + 1.5  mm neck length. This allowed for excellent equalization of limb lengths and appropriate hip offset. Excellent stability was noted both anteriorly and posteriorly. It should be noted that prominent  periacetabular osteophytes were debrided using osteotome and rongeur. Trial components were removed. The acetabular shell was irrigated with copious amounts of normal saline with antibiotic solution and then suctioned dry. A +4 mm neutral Pinnacle Marathon polyethylene liner was positioned and impacted into place. Next, a 13.5 mm small stature AML femoral component was positioned and impacted into place. Excellent scratch fit was appreciated. Trial reduction was again performed with a +1.5 mm neck length. This allowed for good equalization of limb lengths and excellent hip offset. Trial hip ball was removed. The Morse taper was cleaned and dried. A 36 mm M-SPEC femoral head with a +1.5 mm neck length was placed on the trunnion and impacted into place. The hip was reduced and placed through a range of motion. Excellent stability was noted both anteriorly and posteriorly. The wound was irrigated with copious amounts of normal saline with antibiotic solution using pulsatile lavage and suction drying. The posterior capsulotomy was repaired using #5 Ethibond. The piriformis tendon was reapproximated on the surface of the gluteus medius tendon using #5 Ethibond. The gluteal sling was repaired using #5 Ethibond. Two medium drains were placed in the wound bed and brought out through a separate stab incision to be attached to a Hemovac reservoir. IT band was repaired using interrupted sutures of #1 Vicryl. The subcutaneous tissue was approximated in layers using first #0 Vicryl followed by #2-0 Vicryl. Skin was closed with skin staples. A sterile dressing was applied.   The patient tolerated the procedure well. He was transported to the recovery room in stable condition.    ____________________________ Laurice Record. Holley Bouche., MD jph:hh D: 07/10/2014 22:35:43 ET T: 07/10/2014 23:09:57 ET JOB#: [id number]  cc: Laurice Record. Holley Bouche., MD, <Dictator> Buford P Holley Bouche MD ELECTRONICALLY SIGNED 07/16/2014 9:31

## 2015-03-06 ENCOUNTER — Other Ambulatory Visit
Admission: RE | Admit: 2015-03-06 | Discharge: 2015-03-06 | Disposition: A | Discharge status: Home / Self Care | Payer: Medicare Other | Source: Ambulatory Visit | Attending: Interventional Radiology | Admitting: Interventional Radiology

## 2015-03-06 DIAGNOSIS — Z08 Encounter for follow-up examination after completed treatment for malignant neoplasm: Secondary | ICD-10-CM | POA: Diagnosis present

## 2015-03-06 DIAGNOSIS — C642 Malignant neoplasm of left kidney, except renal pelvis: Secondary | ICD-10-CM | POA: Diagnosis present

## 2015-03-06 DIAGNOSIS — Z01812 Encounter for preprocedural laboratory examination: Secondary | ICD-10-CM | POA: Insufficient documentation

## 2015-03-06 LAB — CREATININE, SERUM
Creatinine, Ser: 1.27 mg/dL — ABNORMAL HIGH (ref 0.61–1.24)
GFR calc Af Amer: >60 mL/min (ref >60)
GFR, EST NON AFRICAN AMERICAN: 56 mL/min — AB (ref >60)

## 2015-03-06 LAB — BUN: BUN: 20 mg/dL (ref 6–20)

## 2015-03-13 ENCOUNTER — Ambulatory Visit (HOSPITAL_COMMUNITY)
Admission: RE | Admit: 2015-03-13 | Discharge: 2015-03-13 | Disposition: A | Discharge status: Home / Self Care | Payer: Medicare Other | Source: Ambulatory Visit | Attending: Interventional Radiology | Admitting: Interventional Radiology

## 2015-03-13 ENCOUNTER — Ambulatory Visit
Admission: RE | Admit: 2015-03-13 | Discharge: 2015-03-13 | Disposition: A | Discharge status: Home / Self Care | Payer: Medicare Other | Source: Ambulatory Visit | Attending: Interventional Radiology | Admitting: Interventional Radiology

## 2015-03-13 DIAGNOSIS — C649 Malignant neoplasm of unspecified kidney, except renal pelvis: Secondary | ICD-10-CM | POA: Insufficient documentation

## 2015-03-13 DIAGNOSIS — C642 Malignant neoplasm of left kidney, except renal pelvis: Secondary | ICD-10-CM

## 2015-03-13 DIAGNOSIS — N200 Calculus of kidney: Secondary | ICD-10-CM | POA: Insufficient documentation

## 2015-03-13 DIAGNOSIS — Z08 Encounter for follow-up examination after completed treatment for malignant neoplasm: Secondary | ICD-10-CM | POA: Insufficient documentation

## 2015-03-13 DIAGNOSIS — K760 Fatty (change of) liver, not elsewhere classified: Secondary | ICD-10-CM | POA: Diagnosis not present

## 2015-03-13 DIAGNOSIS — K8020 Calculus of gallbladder without cholecystitis without obstruction: Secondary | ICD-10-CM | POA: Diagnosis not present

## 2015-03-13 DIAGNOSIS — N281 Cyst of kidney, acquired: Secondary | ICD-10-CM | POA: Insufficient documentation

## 2015-03-13 MED ORDER — IOHEXOL 300 MG/ML  SOLN
100.0000 mL | Freq: Once | INTRAMUSCULAR | Status: AC | PRN
  Administered 2015-03-13: 100 mL via INTRAVENOUS

## 2015-03-13 NOTE — Progress Notes (Signed)
Chief Complaint: Chief Complaint  Patient presents with  . Follow-up    3 yr follow up Cryoablation of Left Renal Cell Carcinoma   History of Present Illness: Bryan Frey is a 69 y.o. 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 Bryan Frey has been doing well since his prior follow-up one year ago. He denies any current symptoms other than some chronic weak urinary stream. He is scheduled to follow-up with Dr. Diona Fanti soon.  Past Medical History  Diagnosis Date  . Nephrolithiasis   . High cholesterol   . 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 Hypertension     EKG,  chest  4/13 EPIC  . Anxiety     with diagnosis  . Gall stones   . left renal ca dx'd 03/2012    tumor in kidney .Marland Kitchen    Past Surgical History  Procedure Laterality Date  . Joint replacement  2011    Lt hip  . Lumbar laminectomy/decompression microdiscectomy  02/09/2012    Procedure: LUMBAR LAMINECTOMY/DECOMPRESSION MICRODISCECTOMY 2 LEVELS;  Surgeon: Kristeen Miss, MD;  Location: [facility];  Service: Neurosurgery;  Laterality: Bilateral;  Bilateral Lumbar two-three,lumbar four-five laminectomies  . Nephrolithotomy Right 02/09/2014    Procedure: NEPHROLITHOTOMY PERCUTANEOUS;  Surgeon: Franchot Gallo, MD;  Location: [facility];  Service: Urology;  Laterality: Right;    Allergies: Morphine and related  Medications: Prior to Admission medications   Medication Sig Start Date End Date Taking? Authorizing Provider  amLODipine (NORVASC) 10 MG tablet Take 10 mg by mouth every evening.    Yes Historical Provider, MD  aspirin EC 81 MG tablet Take 81 mg by mouth daily.   Yes Historical Provider, MD  carbamazepine (TEGRETOL) 200 MG tablet Take 200 mg by mouth 2 (two) times daily.   Yes Historical Provider, MD  hydrochlorothiazide (HYDRODIURIL) 25 MG tablet Take 25 mg by mouth every evening.    Yes Historical Provider, MD  naproxen sodium (ANAPROX) 220 MG tablet Take 220 mg by mouth 2 (two) times  daily with a meal.   Yes Historical Provider, MD  senna-docusate (SENOKOT-S) 8.6-50 MG per tablet Take 1 tablet by mouth 2 (two) times daily. While taking pain meds to prevent constipation 02/11/14  Yes Alexis Frock, MD  traMADol (ULTRAM) 50 MG tablet Take 1 tablet (50 mg total) by mouth every 6 (six) hours as needed for moderate pain. For pain 02/11/14  Yes Alexis Frock, MD  ciprofloxacin (CIPRO) 500 MG tablet Take 1 tablet (500 mg total) by mouth 2 (two) times daily. Patient not taking: Reported on 03/13/2015 02/10/14   Franchot Gallo, MD  docusate sodium 100 MG CAPS Take 100 mg by mouth 2 (two) times daily. Patient not taking: Reported on 03/13/2015 02/10/14   Franchot Gallo, MD  Multiple Vitamin (MULITIVITAMIN WITH MINERALS) TABS Take 1 tablet by mouth daily.    Historical Provider, MD  Tamsulosin HCl (FLOMAX) 0.4 MG CAPS Take 0.4 mg by mouth daily.    Historical Provider, MD    Family History  Problem Relation Age of Onset  . Anesthesia problems Neg Hx     History   Social History  . Marital Status: Married    Spouse Name: N/A  . Number of Children: N/A  . Years of Education: N/A   Social History Main Topics  . Smoking status: Never Smoker   . Smokeless tobacco: Never Used  . Alcohol Use: No  . Drug Use: No  Comment: tramadol  . Sexual Activity: Not on file   Other Topics Concern  . Not on file   Social History Narrative  . No narrative on file     Review of Systems: A 12 point ROS discussed and pertinent positives are indicated in the HPI above.  All other systems are negative.  Review of Systems  Constitutional: Negative.   Respiratory: Negative.   Cardiovascular: Negative.   Gastrointestinal: Negative.   Genitourinary: Negative.   Neurological: Negative.     Vital Signs: BP 144/73 mmHg  Pulse 85  Temp(Src) 97.9 F (36.6 C) (Oral)  Resp 14  Ht 5\' 11"  (1.803 m)  Wt 240 lb (108.863 kg)  BMI 33.49 kg/m2  SpO2 98%  Physical Exam  Constitutional: He  is oriented to person, place, and time. He appears well-developed and well-nourished. No distress.  Abdominal: Soft. He exhibits no distension. There is no tenderness. There is no rebound and no guarding.  Neurological: He is alert and oriented to person, place, and time.  Skin: He is not diaphoretic.  Nursing note and vitals reviewed.   Imaging: Ct Abd Wo & W Cm  03/13/2015   CLINICAL DATA:  Renal cell carcinoma diagnosed in 2013 status post cryo ablation. Subsequent encounter.  EXAM: CT ABDOMEN WITHOUT AND WITH CONTRAST  TECHNIQUE: Multidetector CT imaging of the abdomen was performed following the standard protocol before and following the bolus administration of intravenous contrast.  CONTRAST:  111mL OMNIPAQUE IOHEXOL 300 MG/ML  SOLN  COMPARISON:  Prior examinations 03/14/2014 and 03/23/2013.  FINDINGS: Lower chest: Clear lung bases. No significant pleural or pericardial effusion.  Hepatobiliary: Hepatic steatosis again noted. No focal liver lesions are identified on post-contrast imaging. Calcified gallstone again noted. There is no gallbladder wall thickening or biliary dilatation.  Pancreas: Unremarkable. No pancreatic ductal dilatation or surrounding inflammatory changes.  Spleen: Normal in size without focal abnormality.  Adrenals/Urinary Tract: Both adrenal glands appear normal.Pre contrast images again demonstrate numerous nonobstructing bilateral renal calculi, more numerous on the right. There is no evidence of hydronephrosis or ureteral calculus. There has been minimal interval contraction of the central cryoablation zone involving the lower pole of the left kidney, measuring 2.8 cm. There is no suspicious enhancement in this area. Other smaller low-density renal lesions bilaterally are stable without abnormal enhancement. The pelvis was not imaged.  Stomach/Bowel: No evidence of bowel wall thickening, distention or surrounding inflammatory change.The cecum is in the right mid abdomen.   Vascular/Lymphatic: Mildly prominent lymph nodes within the porta hepatis are stable. There are no enlarged lymph nodes at the renal hila. Atherosclerosis of the aorta and iliac arteries is noted. The IVC and renal veins appear unremarkable.  Other: None.  Musculoskeletal: Multilevel spondylosis is noted with multiple ankylosing paraspinal osteophytes in the thoracic and upper lumbar spine consistent with diffuse idiopathic skeletal hyperostosis. There is chronic advanced disc degeneration at L2-3 with functional adjacent segment disease.  IMPRESSION: 1. Little change in the cryoablation zone involving the lower pole of the left kidney. No evidence of local recurrence. 2. No evidence of metastatic disease. Small lymph nodes within the porta hepatis are stable. 3. Stable bilateral renal cysts and nephrolithiasis. No evidence of hydronephrosis. 4. Cholelithiasis, hepatic steatosis and diffuse spondylosis noted.   Electronically Signed   By: Richardean Sale M.D.   On: 03/13/2015 09:00    Labs:  CBC:  Recent Labs  06/26/14 0937 07/11/14 0604 07/12/14 0625  WBC 5.7  --   --   HGB  13.7 10.6* 10.5*  HCT 42.7  --   --   PLT 286 204 195    COAGS:  Recent Labs  06/26/14 0937  INR 1.0  APTT 28.6    BMP:  Recent Labs  06/26/14 0937 07/11/14 0604 07/12/14 0625 03/06/15 1006  NA 141 138 141  --   K 3.8 3.6 3.5  --   CL 104 105 108*  --   CO2 30 30 29   --   GLUCOSE 104* 109* 115*  --   BUN 16 11 7 20   CALCIUM 8.7 7.3* 7.6*  --   CREATININE 1.11 1.21 0.97 1.27*  GFRNONAA >60  --   --  56*  GFRAA >60  --   --  >60    LIVER FUNCTION TESTS: No results for input(s): BILITOT, AST, ALT, ALKPHOS, PROT, ALBUMIN in the last 8760 hours.  TUMOR MARKERS: No results for input(s): AFPTM, CEA, CA199, CHROMGRNA in the last 8760 hours.  Assessment and Plan:  Renal function is normal and stable. Follow-up CT was performed earlier today. I reviewed the images with Mr. Eisenhour and his wife. Based  on my measurements, the post ablation scar tissue has decreased in size from approximately 2.9 x 3.0 cm last year to approximately 2.3 x 2.8 cm currently. There is no enhancement identified within the ablated tissue. No evidence of recurrent carcinoma or metastatic disease in the abdomen. Stable bilateral renal calculi without hydronephrosis. I recommended follow-up imaging in one year.   SignedAletta Edouard T 03/13/2015, 1:08 PM     I spent a total of 15 minutes face to face in clinical consultation, greater than 50% of which was counseling/coordinating care post ablation of a left renal carcinoma.

## 2015-05-01 ENCOUNTER — Encounter
Admission: RE | Admit: 2015-05-01 | Discharge: 2015-05-01 | Disposition: A | Discharge status: Home / Self Care | Payer: Medicare Other | Source: Ambulatory Visit | Attending: Orthopedic Surgery | Admitting: Orthopedic Surgery

## 2015-05-01 DIAGNOSIS — M79605 Pain in left leg: Secondary | ICD-10-CM | POA: Diagnosis not present

## 2015-05-01 DIAGNOSIS — M25552 Pain in left hip: Secondary | ICD-10-CM | POA: Insufficient documentation

## 2015-05-01 DIAGNOSIS — Z79899 Other long term (current) drug therapy: Secondary | ICD-10-CM | POA: Diagnosis not present

## 2015-05-01 DIAGNOSIS — I10 Essential (primary) hypertension: Secondary | ICD-10-CM | POA: Diagnosis not present

## 2015-05-01 DIAGNOSIS — Z01812 Encounter for preprocedural laboratory examination: Secondary | ICD-10-CM | POA: Insufficient documentation

## 2015-05-01 DIAGNOSIS — Z7982 Long term (current) use of aspirin: Secondary | ICD-10-CM | POA: Insufficient documentation

## 2015-05-01 DIAGNOSIS — E785 Hyperlipidemia, unspecified: Secondary | ICD-10-CM | POA: Insufficient documentation

## 2015-05-01 HISTORY — DX: Myoneural disorder, unspecified: G70.9

## 2015-05-01 LAB — BASIC METABOLIC PANEL
ANION GAP: 8 (ref 5–15)
BUN: 18 mg/dL (ref 6–20)
CO2: 28 mmol/L (ref 22–32)
Calcium: 8.9 mg/dL (ref 8.9–10.3)
Chloride: 101 mmol/L (ref 101–111)
Creatinine, Ser: 1.12 mg/dL (ref 0.61–1.24)
GFR calc non Af Amer: >60 mL/min (ref >60)
GLUCOSE: 109 mg/dL — AB (ref 65–99)
Potassium: 4 mmol/L (ref 3.5–5.1)
Sodium: 137 mmol/L (ref 135–145)

## 2015-05-01 LAB — URINALYSIS COMPLETE WITH MICROSCOPIC (ARMC ONLY)
BACTERIA UA: NONE SEEN
Bilirubin Urine: NEGATIVE
Glucose, UA: NEGATIVE mg/dL
HGB URINE DIPSTICK: NEGATIVE
Ketones, ur: NEGATIVE mg/dL
Leukocytes, UA: NEGATIVE
Nitrite: NEGATIVE
PH: 5 (ref 5.0–8.0)
PROTEIN: NEGATIVE mg/dL
SQUAMOUS EPITHELIAL / LPF: NONE SEEN
Specific Gravity, Urine: 1.019 (ref 1.005–1.030)

## 2015-05-01 LAB — APTT: APTT: 29 s (ref 24–36)

## 2015-05-01 LAB — CBC
HCT: 42.7 % (ref 40.0–52.0)
Hemoglobin: 14 g/dL (ref 13.0–18.0)
MCH: 28.4 pg (ref 26.0–34.0)
MCHC: 32.7 g/dL (ref 32.0–36.0)
MCV: 86.9 fL (ref 80.0–100.0)
Platelets: 268 10*3/uL (ref 150–440)
RBC: 4.92 MIL/uL (ref 4.40–5.90)
RDW: 14.2 % (ref 11.5–14.5)
WBC: 6.7 10*3/uL (ref 3.8–10.6)

## 2015-05-01 LAB — PROTIME-INR
INR: 0.99
Prothrombin Time: 13.3 seconds (ref 11.4–15.0)

## 2015-05-01 LAB — SEDIMENTATION RATE: SED RATE: 30 mm/h — AB (ref 0–16)

## 2015-05-01 LAB — SURGICAL PCR SCREEN
MRSA, PCR: NEGATIVE
Staphylococcus aureus: NEGATIVE

## 2015-05-01 LAB — TYPE AND SCREEN
ABO/RH(D): O NEG
Antibody Screen: NEGATIVE

## 2015-05-01 LAB — ABO/RH: ABO/RH(D): O NEG

## 2015-05-01 NOTE — Patient Instructions (Signed)
  Your procedure is scheduled on: May 14, 2015 (Monday) Report to Day Surgery. To find out your arrival time please call [phone] between 1PM - 3PM on May 11, 2015 (Friday).  Remember: Instructions that are not followed completely may result in serious medical risk, up to and including death, or upon the discretion of your surgeon and anesthesiologist your surgery may need to be rescheduled.    __x__ 1. Do not eat food or drink liquids after midnight. No gum chewing or hard candies.     ____ 2. No Alcohol for 24 hours before or after surgery.   ____ 3. Bring all medications with you on the day of surgery if instructed.    __x__ 4. Notify your doctor if there is any change in your medical condition     (cold, fever, infections).     Do not wear jewelry, make-up, hairpins, clips or nail polish.  Do not wear lotions, powders, or perfumes. You may wear deodorant.  Do not shave 48 hours prior to surgery. Men may shave face and neck.  Do not bring valuables to the hospital.    [facility] is not responsible for any belongings or valuables.               Contacts, dentures or bridgework may not be worn into surgery.  Leave your suitcase in the car. After surgery it may be brought to your room.  For patients admitted to the hospital, discharge time is determined by your                treatment team.   Patients discharged the day of surgery will not be allowed to drive home.   Please read over the following fact sheets that you were given:   MRSA Information and Surgical Site Infection Prevention   _x___ Take these medicines the morning of surgery with A SIP OF WATER:    1. Amlodipine  2. Carbamazipine  3.   4.  5.  6.  ____ Fleet Enema (as directed)   __x__ Use CHG Soap as directed  ____ Use inhalers on the day of surgery  ____ Stop metformin 2 days prior to surgery    ____ Take 1/2 of usual insulin dose the night before surgery and none on the morning of surgery.    __x__ Stop Coumadin/Plavix/aspirin on (STOP ASPIRIN 7-10 DAYS PRIOR TO SURGERY)  __x__ Stop Anti-inflammatories on (STOP ALEVE 7-10 DAYS PRIOR TO SURGERY)   ____ Stop supplements until after surgery.    ____ Bring C-Pap to the hospital.

## 2015-05-03 LAB — URINE CULTURE: CULTURE: NO GROWTH

## 2015-05-14 ENCOUNTER — Inpatient Hospital Stay: Payer: Medicare Other | Admitting: Anesthesiology

## 2015-05-14 ENCOUNTER — Inpatient Hospital Stay: Payer: Medicare Other

## 2015-05-14 ENCOUNTER — Encounter
Admission: RE | Disposition: A | Discharge status: Home Health | Payer: Self-pay | Source: Ambulatory Visit | Attending: Orthopedic Surgery

## 2015-05-14 ENCOUNTER — Inpatient Hospital Stay
Admission: RE | Admit: 2015-05-14 | Discharge: 2015-05-17 | DRG: 468 | Disposition: A | Discharge status: Home Health | Payer: Medicare Other | Source: Ambulatory Visit | Attending: Orthopedic Surgery | Admitting: Orthopedic Surgery

## 2015-05-14 ENCOUNTER — Encounter: Payer: Self-pay | Admitting: *Deleted

## 2015-05-14 DIAGNOSIS — Z833 Family history of diabetes mellitus: Secondary | ICD-10-CM

## 2015-05-14 DIAGNOSIS — E876 Hypokalemia: Secondary | ICD-10-CM | POA: Diagnosis not present

## 2015-05-14 DIAGNOSIS — E780 Pure hypercholesterolemia: Secondary | ICD-10-CM | POA: Diagnosis present

## 2015-05-14 DIAGNOSIS — Z7982 Long term (current) use of aspirin: Secondary | ICD-10-CM | POA: Diagnosis not present

## 2015-05-14 DIAGNOSIS — I10 Essential (primary) hypertension: Secondary | ICD-10-CM | POA: Diagnosis present

## 2015-05-14 DIAGNOSIS — Z885 Allergy status to narcotic agent status: Secondary | ICD-10-CM | POA: Diagnosis not present

## 2015-05-14 DIAGNOSIS — Y838 Other surgical procedures as the cause of abnormal reaction of the patient, or of later complication, without mention of misadventure at the time of the procedure: Secondary | ICD-10-CM | POA: Diagnosis present

## 2015-05-14 DIAGNOSIS — Z87442 Personal history of urinary calculi: Secondary | ICD-10-CM | POA: Diagnosis not present

## 2015-05-14 DIAGNOSIS — Z791 Long term (current) use of non-steroidal anti-inflammatories (NSAID): Secondary | ICD-10-CM

## 2015-05-14 DIAGNOSIS — Z96649 Presence of unspecified artificial hip joint: Secondary | ICD-10-CM

## 2015-05-14 DIAGNOSIS — T84031A Mechanical loosening of internal left hip prosthetic joint, initial encounter: Secondary | ICD-10-CM | POA: Diagnosis present

## 2015-05-14 HISTORY — PX: TOTAL HIP REVISION: SHX763

## 2015-05-14 SURGERY — TOTAL HIP REVISION
Anesthesia: Spinal | Laterality: Left

## 2015-05-14 MED ORDER — AMLODIPINE BESYLATE 10 MG PO TABS
10.0000 mg | ORAL_TABLET | ORAL | Status: DC
  Administered 2015-05-15 – 2015-05-17 (×3): 10 mg via ORAL
  Filled 2015-05-14 (×4): qty 1

## 2015-05-14 MED ORDER — SENNOSIDES-DOCUSATE SODIUM 8.6-50 MG PO TABS
1.0000 | ORAL_TABLET | Freq: Two times a day (BID) | ORAL | Status: DC
  Administered 2015-05-14 – 2015-05-16 (×5): 1 via ORAL
  Filled 2015-05-14 (×5): qty 1

## 2015-05-14 MED ORDER — LACTATED RINGERS IV SOLN
INTRAVENOUS | Status: DC
  Administered 2015-05-14 (×3): via INTRAVENOUS

## 2015-05-14 MED ORDER — ACETAMINOPHEN 325 MG PO TABS
650.0000 mg | ORAL_TABLET | Freq: Four times a day (QID) | ORAL | Status: DC | PRN
  Filled 2015-05-14: qty 2

## 2015-05-14 MED ORDER — SODIUM CHLORIDE 0.9 % IV SOLN
INTRAVENOUS | Status: DC
  Administered 2015-05-14: 16:00:00 via INTRAVENOUS

## 2015-05-14 MED ORDER — METOCLOPRAMIDE HCL 10 MG PO TABS
10.0000 mg | ORAL_TABLET | Freq: Three times a day (TID) | ORAL | Status: AC
  Administered 2015-05-14 – 2015-05-16 (×7): 10 mg via ORAL
  Filled 2015-05-14 (×8): qty 1

## 2015-05-14 MED ORDER — ONDANSETRON HCL 4 MG/2ML IJ SOLN
4.0000 mg | Freq: Four times a day (QID) | INTRAMUSCULAR | Status: DC | PRN
  Administered 2015-05-14: 4 mg via INTRAVENOUS
  Filled 2015-05-14: qty 2

## 2015-05-14 MED ORDER — ACETAMINOPHEN 650 MG RE SUPP: 650.0000 mg | Freq: Four times a day (QID) | RECTAL | Status: DC | PRN

## 2015-05-14 MED ORDER — ENOXAPARIN SODIUM 30 MG/0.3ML ~~LOC~~ SOLN
30.0000 mg | Freq: Two times a day (BID) | SUBCUTANEOUS | Status: DC
Start: 2015-05-15 — End: 2015-05-17
  Administered 2015-05-15 – 2015-05-17 (×5): 30 mg via SUBCUTANEOUS
  Filled 2015-05-14 (×5): qty 0.3

## 2015-05-14 MED ORDER — ALUM & MAG HYDROXIDE-SIMETH 200-200-20 MG/5ML PO SUSP: 30.0000 mL | ORAL | Status: DC | PRN

## 2015-05-14 MED ORDER — CEFAZOLIN SODIUM-DEXTROSE 2-3 GM-% IV SOLR
INTRAVENOUS | Status: AC
  Filled 2015-05-14: qty 50

## 2015-05-14 MED ORDER — PHENOL 1.4 % MT LIQD: 1.0000 | OROMUCOSAL | Status: DC | PRN

## 2015-05-14 MED ORDER — TETRACAINE HCL 1 % IJ SOLN
INTRAMUSCULAR | Status: AC
  Filled 2015-05-14: qty 2

## 2015-05-14 MED ORDER — EPHEDRINE SULFATE 50 MG/ML IJ SOLN
INTRAMUSCULAR | Status: DC | PRN
  Administered 2015-05-14 (×2): 10 mg via INTRAVENOUS

## 2015-05-14 MED ORDER — CEFAZOLIN SODIUM-DEXTROSE 2-3 GM-% IV SOLR
2.0000 g | Freq: Once | INTRAVENOUS | Status: AC
  Administered 2015-05-14 (×2): 2 g via INTRAVENOUS

## 2015-05-14 MED ORDER — OXYCODONE HCL 5 MG PO TABS
5.0000 mg | ORAL_TABLET | ORAL | Status: DC | PRN
  Administered 2015-05-14 (×2): 5 mg via ORAL
  Administered 2015-05-15 – 2015-05-16 (×5): 10 mg via ORAL
  Filled 2015-05-14: qty 1
  Filled 2015-05-14 (×5): qty 2
  Filled 2015-05-14: qty 1
  Filled 2015-05-14: qty 2

## 2015-05-14 MED ORDER — TRANEXAMIC ACID 1000 MG/10ML IV SOLN
1500.0000 mg | INTRAVENOUS | Status: AC
  Administered 2015-05-14: 1000 mg via INTRAVENOUS
  Filled 2015-05-14: qty 15

## 2015-05-14 MED ORDER — CARBAMAZEPINE 200 MG PO TABS
200.0000 mg | ORAL_TABLET | ORAL | Status: DC
  Administered 2015-05-15 – 2015-05-17 (×3): 200 mg via ORAL
  Filled 2015-05-14 (×4): qty 1

## 2015-05-14 MED ORDER — MENTHOL 3 MG MT LOZG
1.0000 | LOZENGE | OROMUCOSAL | Status: DC | PRN
  Filled 2015-05-14: qty 9

## 2015-05-14 MED ORDER — ONDANSETRON HCL 4 MG/2ML IJ SOLN
4.0000 mg | Freq: Once | INTRAMUSCULAR | Status: DC | PRN
Start: 2015-05-14 — End: 2015-05-14

## 2015-05-14 MED ORDER — DIPHENHYDRAMINE HCL 12.5 MG/5ML PO ELIX: 12.5000 mg | ORAL_SOLUTION | ORAL | Status: DC | PRN

## 2015-05-14 MED ORDER — MAGNESIUM HYDROXIDE 400 MG/5ML PO SUSP
30.0000 mL | Freq: Every day | ORAL | Status: DC | PRN
  Administered 2015-05-15 – 2015-05-16 (×2): 30 mL via ORAL
  Filled 2015-05-14 (×2): qty 30

## 2015-05-14 MED ORDER — BUPIVACAINE HCL (PF) 0.5 % IJ SOLN
INTRAMUSCULAR | Status: DC | PRN
  Administered 2015-05-14: 2 mL via INTRATHECAL

## 2015-05-14 MED ORDER — NEOMYCIN-POLYMYXIN B GU 40-200000 IR SOLN
Status: DC | PRN
  Administered 2015-05-14: 14 mL

## 2015-05-14 MED ORDER — PHENYLEPHRINE HCL 10 MG/ML IJ SOLN
INTRAMUSCULAR | Status: AC
  Filled 2015-05-14: qty 1

## 2015-05-14 MED ORDER — FENTANYL CITRATE (PF) 100 MCG/2ML IJ SOLN: 25.0000 ug | INTRAMUSCULAR | Status: DC | PRN

## 2015-05-14 MED ORDER — TRAMADOL HCL 50 MG PO TABS
50.0000 mg | ORAL_TABLET | ORAL | Status: DC | PRN
  Administered 2015-05-14 – 2015-05-15 (×3): 100 mg via ORAL
  Filled 2015-05-14 (×3): qty 2

## 2015-05-14 MED ORDER — PANTOPRAZOLE SODIUM 40 MG PO TBEC
40.0000 mg | DELAYED_RELEASE_TABLET | Freq: Two times a day (BID) | ORAL | Status: DC
  Administered 2015-05-14 – 2015-05-16 (×5): 40 mg via ORAL
  Filled 2015-05-14 (×7): qty 1

## 2015-05-14 MED ORDER — TRANEXAMIC ACID 1000 MG/10ML IV SOLN
1000.0000 mg | Freq: Once | INTRAVENOUS | Status: AC
  Administered 2015-05-14: 1000 mg via INTRAVENOUS
  Filled 2015-05-14: qty 10

## 2015-05-14 MED ORDER — NEOMYCIN-POLYMYXIN B GU 40-200000 IR SOLN
Status: AC
  Filled 2015-05-14: qty 20

## 2015-05-14 MED ORDER — ACETAMINOPHEN 10 MG/ML IV SOLN
INTRAVENOUS | Status: AC
  Filled 2015-05-14: qty 100

## 2015-05-14 MED ORDER — MEPERIDINE HCL 25 MG/ML IJ SOLN
12.5000 mg | INTRAMUSCULAR | Status: DC | PRN
  Administered 2015-05-14: 25 mg via INTRAVENOUS
  Administered 2015-05-14 (×2): 12.5 mg via INTRAVENOUS
  Filled 2015-05-14 (×3): qty 1

## 2015-05-14 MED ORDER — ACETAMINOPHEN 10 MG/ML IV SOLN
1000.0000 mg | Freq: Four times a day (QID) | INTRAVENOUS | Status: AC
  Administered 2015-05-14 – 2015-05-15 (×3): 1000 mg via INTRAVENOUS
  Filled 2015-05-14 (×4): qty 100

## 2015-05-14 MED ORDER — FAMOTIDINE 20 MG PO TABS
ORAL_TABLET | ORAL | Status: AC
  Administered 2015-05-14: 20 mg via ORAL
  Filled 2015-05-14: qty 1

## 2015-05-14 MED ORDER — FAMOTIDINE 20 MG PO TABS
20.0000 mg | ORAL_TABLET | Freq: Once | ORAL | Status: AC
  Administered 2015-05-14: 20 mg via ORAL

## 2015-05-14 MED ORDER — KETAMINE HCL 50 MG/ML IJ SOLN
INTRAMUSCULAR | Status: DC | PRN
  Administered 2015-05-14 (×4): 25 mg via INTRAVENOUS

## 2015-05-14 MED ORDER — LIDOCAINE HCL (PF) 2 % IJ SOLN
INTRAMUSCULAR | Status: DC | PRN
  Administered 2015-05-14: 50 mg

## 2015-05-14 MED ORDER — TETRACAINE HCL 1 % IJ SOLN
INTRAMUSCULAR | Status: DC | PRN
  Administered 2015-05-14: 8 mg via INTRASPINAL

## 2015-05-14 MED ORDER — BISACODYL 10 MG RE SUPP
10.0000 mg | Freq: Every day | RECTAL | Status: DC | PRN
  Administered 2015-05-16: 10 mg via RECTAL
  Filled 2015-05-14: qty 1

## 2015-05-14 MED ORDER — FLEET ENEMA 7-19 GM/118ML RE ENEM: 1.0000 | ENEMA | Freq: Once | RECTAL | Status: DC | PRN

## 2015-05-14 MED ORDER — PROPOFOL INFUSION 10 MG/ML OPTIME
INTRAVENOUS | Status: DC | PRN
  Administered 2015-05-14: 12:00:00 via INTRAVENOUS
  Administered 2015-05-14: 100 ug/kg/min via INTRAVENOUS
  Administered 2015-05-14: 80 ug/kg/min via INTRAVENOUS
  Administered 2015-05-14 (×2): via INTRAVENOUS
  Administered 2015-05-14: 50 ug/kg/min via INTRAVENOUS

## 2015-05-14 MED ORDER — CARBAMAZEPINE 200 MG PO TABS
300.0000 mg | ORAL_TABLET | Freq: Every day | ORAL | Status: DC
  Administered 2015-05-14 – 2015-05-16 (×3): 300 mg via ORAL
  Filled 2015-05-14 (×2): qty 2
  Filled 2015-05-14 (×2): qty 1.5

## 2015-05-14 MED ORDER — FENTANYL CITRATE (PF) 100 MCG/2ML IJ SOLN
INTRAMUSCULAR | Status: DC | PRN
  Administered 2015-05-14 (×4): 50 ug via INTRAVENOUS

## 2015-05-14 MED ORDER — FERROUS SULFATE 325 (65 FE) MG PO TABS
325.0000 mg | ORAL_TABLET | Freq: Two times a day (BID) | ORAL | Status: DC
  Administered 2015-05-14 – 2015-05-17 (×6): 325 mg via ORAL
  Filled 2015-05-14 (×6): qty 1

## 2015-05-14 MED ORDER — HYDROCHLOROTHIAZIDE 25 MG PO TABS
25.0000 mg | ORAL_TABLET | Freq: Every day | ORAL | Status: DC
  Administered 2015-05-15 – 2015-05-16 (×2): 25 mg via ORAL
  Filled 2015-05-14 (×2): qty 1

## 2015-05-14 MED ORDER — ACETAMINOPHEN 10 MG/ML IV SOLN
INTRAVENOUS | Status: DC | PRN
  Administered 2015-05-14: 1000 mg via INTRAVENOUS

## 2015-05-14 MED ORDER — CEFAZOLIN SODIUM-DEXTROSE 2-3 GM-% IV SOLR
2.0000 g | Freq: Four times a day (QID) | INTRAVENOUS | Status: AC
  Administered 2015-05-14 – 2015-05-15 (×4): 2 g via INTRAVENOUS
  Filled 2015-05-14 (×5): qty 50

## 2015-05-14 MED ORDER — ONDANSETRON HCL 4 MG PO TABS: 4.0000 mg | ORAL_TABLET | Freq: Four times a day (QID) | ORAL | Status: DC | PRN

## 2015-05-14 MED ORDER — PHENYLEPHRINE HCL 10 MG/ML IJ SOLN
INTRAMUSCULAR | Status: DC | PRN
  Administered 2015-05-14 (×3): 100 ug via INTRAVENOUS

## 2015-05-14 MED ORDER — MIDAZOLAM HCL 5 MG/5ML IJ SOLN
INTRAMUSCULAR | Status: DC | PRN
  Administered 2015-05-14 (×3): 1 mg via INTRAVENOUS

## 2015-05-14 SURGICAL SUPPLY — 70 items
BLADE EXPLANT LONG FULL 60 (BLADE) ×2 IMPLANT
BLADE EXPLANT SHORT TRUNC 60 ×2 IMPLANT
BLADE SURG SZ10 CARB STEEL (BLADE) ×2 IMPLANT
BNDG COHESIVE 4X5 TAN STRL (GAUZE/BANDAGES/DRESSINGS) ×2 IMPLANT
BNDG COHESIVE 6X5 TAN STRL LF (GAUZE/BANDAGES/DRESSINGS) ×2 IMPLANT
BUR SURG ROUTER 3X19 TPS (BURR) ×2 IMPLANT
CANISTER SUCT 1200ML W/VALVE (MISCELLANEOUS) ×2 IMPLANT
CANISTER SUCT 3000ML (MISCELLANEOUS) ×6 IMPLANT
CATH TRAY METER 16FR LF (MISCELLANEOUS) ×2 IMPLANT
CUP GRIPTION SECTOR 62MM (Orthopedic Implant) ×2 IMPLANT
DRAPE INCISE IOBAN 66X60 STRL (DRAPES) ×2 IMPLANT
DRAPE SHEET LG 3/4 BI-LAMINATE (DRAPES) ×4 IMPLANT
DRAPE TABLE BACK 80X90 (DRAPES) ×2 IMPLANT
DRSG DERMACEA 8X12 NADH (GAUZE/BANDAGES/DRESSINGS) ×2 IMPLANT
DRSG OPSITE POSTOP 4X14 (GAUZE/BANDAGES/DRESSINGS) ×2 IMPLANT
DRSG TEGADERM 4X4.75 (GAUZE/BANDAGES/DRESSINGS) ×2 IMPLANT
DURAPREP 26ML APPLICATOR (WOUND CARE) ×4 IMPLANT
ELECT BLADE 6.5 EXT (BLADE) ×2 IMPLANT
ELECT CAUTERY BLADE 6.4 (BLADE) ×2 IMPLANT
FLEXIBLE CURVED OSTEOTOME ×2 IMPLANT
GAUZE PACK 2X3YD (MISCELLANEOUS) ×2 IMPLANT
GAUZE SPONGE 4X4 12PLY STRL (GAUZE/BANDAGES/DRESSINGS) ×2 IMPLANT
GLOVE BIO SURGEON STRL SZ8 (GLOVE) ×6 IMPLANT
GLOVE BIOGEL M STRL SZ7.5 (GLOVE) ×8 IMPLANT
GLOVE BIOGEL PI IND STRL 9 (GLOVE) ×2 IMPLANT
GLOVE BIOGEL PI INDICATOR 9 (GLOVE) ×2
GLOVE INDICATOR 8.0 STRL GRN (GLOVE) ×6 IMPLANT
GOWN STRL REUS W/ TWL LRG LVL3 (GOWN DISPOSABLE) ×1 IMPLANT
GOWN STRL REUS W/TWL 2XL LVL3 (GOWN DISPOSABLE) ×2 IMPLANT
GOWN STRL REUS W/TWL LRG LVL3 (GOWN DISPOSABLE) ×1
GOWN STRL REUS W/TWL LRG LVL4 (GOWN DISPOSABLE) ×2 IMPLANT
GOWN STRL REUS W/TWL XL LVL4 (GOWN DISPOSABLE) ×2 IMPLANT
Gription Acetabular Shell Sector ×2 IMPLANT
HANDPIECE SUCTION TUBG SURGILV (MISCELLANEOUS) IMPLANT
HANDPIECE VERSAJET DEBRIDEMENT (MISCELLANEOUS) IMPLANT
HEAD M SROM 36MM PLUS 1.5 (Hips) ×1 IMPLANT
HEMOVAC 400CC 10FR (MISCELLANEOUS) ×2 IMPLANT
HOOD PEEL AWAY FACE SHEILD DIS (HOOD) ×4 IMPLANT
IRRIGATION STRYKERFLOW (MISCELLANEOUS) ×1 IMPLANT
IRRIGATOR STRYKERFLOW (MISCELLANEOUS) ×2
IV NS 100ML SINGLE PACK (IV SOLUTION) ×2 IMPLANT
LINER MARATHON 40MM 10DEG 36MM (Hips) ×2 IMPLANT
NDL SAFETY 18GX1.5 (NEEDLE) ×2 IMPLANT
NEEDLE FILTER BLUNT 18X 1/2SAF (NEEDLE) ×1
NEEDLE FILTER BLUNT 18X1 1/2 (NEEDLE) ×1 IMPLANT
NS IRRIG 1000ML POUR BTL (IV SOLUTION) ×2 IMPLANT
PACK HIP PROSTHESIS (MISCELLANEOUS) ×2 IMPLANT
PRESSURIZER FEM CANAL M (MISCELLANEOUS) ×2 IMPLANT
SCREW 6.5MMX30MM (Screw) ×2 IMPLANT
SCREW PINN CAN 6.5X20 (Screw) ×2 IMPLANT
SCREW PINN CAN BONE 6.5MMX15MM (Screw) ×2 IMPLANT
SOL .9 NS 3000ML IRR  AL (IV SOLUTION)
SOL .9 NS 3000ML IRR UROMATIC (IV SOLUTION) IMPLANT
SPONGE LAP 18X18 5 PK (GAUZE/BANDAGES/DRESSINGS) ×2 IMPLANT
SROM M HEAD 36MM PLUS 1.5 (Hips) ×2 IMPLANT
STAPLER SKIN PROX 35W (STAPLE) ×2 IMPLANT
STRAP SAFETY BODY (MISCELLANEOUS) ×2 IMPLANT
SUCTION FRAZIER TIP 10 FR DISP (SUCTIONS) ×2 IMPLANT
SUT ETHIBOND #5 BRAIDED 30INL (SUTURE) ×2 IMPLANT
SUT VIC AB 0 CT1 36 (SUTURE) ×2 IMPLANT
SUT VIC AB 1 CT1 36 (SUTURE) ×2 IMPLANT
SUT VIC AB 2-0 CT1 27 (SUTURE) ×1
SUT VIC AB 2-0 CT1 TAPERPNT 27 (SUTURE) ×1 IMPLANT
SYR 20CC LL (SYRINGE) ×2 IMPLANT
TAPE ADH 3 LX (MISCELLANEOUS) ×2 IMPLANT
TAPE TRANSPORE STRL 2 31045 (GAUZE/BANDAGES/DRESSINGS) ×2 IMPLANT
THR ST PIN 3/16X9 BAY 6PK (Pin) ×2 IMPLANT
TIP COAXIAL FEMORAL CANAL (MISCELLANEOUS) IMPLANT
TOWER CARTRIDGE SMART MIX (DISPOSABLE) IMPLANT
WATER STERILE IRR 1000ML POUR (IV SOLUTION) ×2 IMPLANT

## 2015-05-14 NOTE — Brief Op Note (Signed)
05/14/2015  2:19 PM  PATIENT:  Bryan Frey  69 y.o. male  PRE-OPERATIVE DIAGNOSIS:  STATUS POST LEFT TOTAL HIP REPLACEMENT, loose acetabular component (fibrous ingrowth)  POST-OPERATIVE DIAGNOSIS:  same  PROCEDURE:  Revision of left acetabular component  SURGEON:  Surgeon(s) and Role:    * Dereck Leep, MD - Primary  ASSISTANTS: Vance Peper, PA   ANESTHESIA:   spinal  EBL:  Total I/O In: 2700 [I.V.:2700] Out: 1300 [Urine:500; Blood:800]  BLOOD ADMINISTERED:none  DRAINS: 2 medium hemovac   LOCAL MEDICATIONS USED:  NONE  SPECIMEN:  No Specimen  DISPOSITION OF SPECIMEN:  N/A  COUNTS:  YES  TOURNIQUET:  * No tourniquets in log *  DICTATION: .Dragon Dictation  PLAN OF CARE: Admit to inpatient   PATIENT DISPOSITION:  PACU - hemodynamically stable.   Delay start of Pharmacological VTE agent (>24hrs) due to surgical blood loss or risk of bleeding: yes

## 2015-05-14 NOTE — H&P (Signed)
The patient has been re-examined, and the chart reviewed, and there have been no interval changes to the documented history and physical.    The risks, benefits, and alternatives have been discussed at length. The patient expressed understanding of the risks benefits and agreed with plans for surgical intervention.  Cheng P. Rashae Rother, Jr. M.D.    

## 2015-05-14 NOTE — Anesthesia Postprocedure Evaluation (Signed)
  Anesthesia Post-op Note  Patient: Bryan Frey  Procedure(s) Performed: Procedure(s): TOTAL HIP REVISION (Left)  Anesthesia type:Spinal  Patient location: PACU  Post pain: Pain level controlled  Post assessment: Post-op Vital signs reviewed, Patient's Cardiovascular Status Stable, Respiratory Function Stable, Patent Airway and No signs of Nausea or vomiting  Post vital signs: Reviewed and stable  Last Vitals:  Filed Vitals:   05/14/15 1705  BP: 122/71  Pulse: 86  Temp: 36.6 C  Resp: 18    Level of consciousness: awake, alert  and patient cooperative  Complications: No apparent anesthesia complications

## 2015-05-14 NOTE — Anesthesia Procedure Notes (Signed)
Spinal Patient location during procedure: OR Start time: 05/14/2015 7:20 AM End time: 05/14/2015 7:25 AM Staffing Anesthesiologist: Gunnar Bulla Resident/CRNA: Rolla Plate Performed by: resident/CRNA  Preanesthetic Checklist Completed: patient identified, site marked, surgical consent, pre-op evaluation, timeout performed, IV checked, risks and benefits discussed and monitors and equipment checked Spinal Block Patient position: sitting Prep: Betadine and site prepped and draped Patient monitoring: heart rate, continuous pulse ox, blood pressure and cardiac monitor Approach: midline Location: L4-5 Injection technique: single-shot Needle Needle type: Whitacre and Introducer  Needle gauge: 25 G Needle length: 12.7 cm Additional Notes Negative paresthesia. Negative blood return. Positive free-flowing CSF. Expiration date of kit checked and confirmed. Patient tolerated procedure well, without complications.

## 2015-05-14 NOTE — Anesthesia Preprocedure Evaluation (Addendum)
Anesthesia Evaluation  Patient identified by MRN, date of birth, ID band Patient awake    Reviewed: Allergy & Precautions, NPO status , Patient's Chart, lab work & pertinent test results, reviewed documented beta blocker date and time   Airway Mallampati: III  TM Distance: >3 FB     Dental  (+) Chipped   Pulmonary          Cardiovascular hypertension, Pt. on medications     Neuro/Psych Anxiety  Neuromuscular disease    GI/Hepatic   Endo/Other    Renal/GU Renal disease     Musculoskeletal  (+) Arthritis -,   Abdominal   Peds  Hematology   Anesthesia Other Findings Takes tegretol for trigeminal neuralgia - well controlled.  Reproductive/Obstetrics                            Anesthesia Physical Anesthesia Plan  ASA: III  Anesthesia Plan: Spinal   Post-op Pain Management:    Induction:   Airway Management Planned: Nasal Cannula  Additional Equipment:   Intra-op Plan:   Post-operative Plan:   Informed Consent: I have reviewed the patients History and Physical, chart, labs and discussed the procedure including the risks, benefits and alternatives for the proposed anesthesia with the patient or authorized representative who has indicated his/her understanding and acceptance.     Plan Discussed with: CRNA  Anesthesia Plan Comments:         Anesthesia Quick Evaluation

## 2015-05-14 NOTE — Op Note (Signed)
OPERATIVE NOTE  DATE OF SURGERY:  05/14/2015  PATIENT NAME:  Bryan Frey   DOB: 02-13-1946  MRN: [id number]  PRE-OPERATIVE DIAGNOSIS: Loosening of left acetabular component status post left total hip arthroplasty  POST-OPERATIVE DIAGNOSIS:  Loosening left acetabular component (fibrous ingrowth) status post left total hip arthroplasty  PROCEDURE:  Revision of left acetabular component  SURGEON:  Dereck Leep, Jr. M.D.  ASSISTANT:  Vance Peper, PA (present and scrubbed throughout the case, critical for assistance with exposure, retraction, instrumentation, and closure)  ANESTHESIA: spinal  ESTIMATED BLOOD LOSS: 800 mL  FLUIDS REPLACED: 2400 mL of crystalloid  DRAINS: 2 medium drains to a Hemovac reservoir  IMPLANTS UTILIZED: DePuy 62 mm OD Pinnacle Gription Sector acetabular component, +4 mm neutral Pinnacle Marathon polyethylene insert, a 36 mm M-SPEC +1.5 mm hip ball, and 3 6.5 mm cancellous bone screws  INDICATIONS FOR SURGERY: Bryan Frey is a 69 y.o. year old male previously underwent a left total hip arthroplasty approximately 5 years ago. Recent radiographs demonstrated migration of the acetabular component suggestive of loosening. After discussion of the risks and benefits of surgical intervention, the patient expressed understanding of the risks benefits and agree with plans for revision of the left acetabular component.   The risks, benefits, and alternatives were discussed at length including but not limited to the risks of infection, bleeding, nerve injury, stiffness, blood clots, the need for revision surgery, limb length inequality, dislocation, cardiopulmonary complications, among others, and they were willing to proceed.  PROCEDURE IN DETAIL: The patient was brought into the operating room and, after adequate spinal anesthesia was achieved, the patient was placed in a right lateral decubitus position. Axillary roll was placed and all bony prominences were  well-padded. The patient's left hip was cleaned and prepped with alcohol and DuraPrep and draped in the usual sterile fashion. A "timeout" was performed as per usual protocol. A lateral curvilinear incision was made gently curving towards the posterior superior iliac spine. The IT band was incised in line with the skin incision and the fibers of the gluteus maximus were split in line.  The pseudocapsule was identified and a T type posterior capsulotomy was performed. The gluteal sling was incised so as to better mobilize the proximal femur. Abundant scar tissue was debrided from the area around the femoral neck and acetabular component. The femoral head was dislocated posteriorly and the 36 mm hip ball was removed from the trunnion. The trunnion was in excellent condition without evidence of wear. There was lysis of bone from around the calcar region. The stem was evaluated and noted to be stable. Attention was then directed to the acetabular component. The polyethylene was removed using a 6.5 mm cancellous screw. The 26.5 mm cancellous screws were then removed from the dome. The acetabular component was noted the superior and relatively vertical with significant increase in anteversion. The area around the edge of the acetabular component was carefully debrided of soft tissue using curettes and rongeur. The bone implant interface was carefully developed using a combination of curved osteotomes and the Zimmer explant device. Vicegrip pliers were then attached to the rim of the cup and the cup was gradually freed. Inspection of the porous surface showed no apparent bony ingrowth but what appeared to be abundant fibrous ingrowth. Serial reaming was performed to redevelop the bony acetabulum. Good bleeding bone was encountered. There appeared to be good support both anteriorly and posteriorly. Reaming was continued up to a 61 mm diameter. A  62 mm outer diameter Pinnacle Gription sector acetabular component was  positioned and impacted into place. Good scratch fit was appreciated. Additional stabilization was achieved using three 6.5 mm cancellous screws. A +4 mm neutral polyethylene trial was placed and trial reduction was performed with a 36 mm hip ball with a +1.5 mm neck length. This for restoration of equal limb lengths and excellent stability both anteriorly and posteriorly. The trial components were removed. A +4 mm neutral Pinnacle Marathon polyethylene insert was positioned and impacted into place. A trial reduction was again performed with a 36 mm hip ball with a +1.5 mm neck length. Again, good equalization of limb lengths was appreciated and excellent stability appreciated both anteriorly and posteriorly. The hip was then dislocated and the trial hip ball was removed. The Morse taper was cleaned and dried. A 36 mm M-SPEC hip ball with a +1.5 mm neck length was placed on the trunnion and impacted into place. The hip was then reduced and placed through range of motion. Excellent stability was appreciated both anteriorly and posteriorly.  The wound was irrigated with copious amounts of normal saline with antibiotic solution and suctioned dry. Good hemostasis was appreciated. The posterior capsulotomy was repaired using #5 Ethibond. The gluteal sling was repaired using #5 Ethibond. Two medium drains were placed in the wound bed and brought out through separate stab incisions to be attached to a Hemovac reservoir. The IT band was reapproximated using interrupted sutures of #1 Vicryl. Subcutaneous tissue was proximal phalanx using first #0 Vicryl followed by #2-0 Vicryl. The skin was closed with skin staples.  The patient tolerated the procedure well and was transported to the recovery room in stable condition.   Marciano Sequin., M.D.

## 2015-05-14 NOTE — Transfer of Care (Signed)
Immediate Anesthesia Transfer of Care Note  Patient: Bryan Frey  Procedure(s) Performed: Procedure(s): TOTAL HIP REVISION (Left)  Patient Location: PACU  Anesthesia Type:Spinal  Level of Consciousness: awake  Airway & Oxygen Therapy: Patient Spontanous Breathing and Patient connected to face mask oxygen  Post-op Assessment: Report given to RN  Post vital signs: Reviewed  Last Vitals:  Filed Vitals:   05/14/15 1417  BP: 121/74  Pulse: 68  Temp: 37 C  Resp: 20    Complications: No apparent anesthesia complications

## 2015-05-15 LAB — BASIC METABOLIC PANEL
ANION GAP: 7 (ref 5–15)
BUN: 13 mg/dL (ref 6–20)
CHLORIDE: 102 mmol/L (ref 101–111)
CO2: 28 mmol/L (ref 22–32)
Calcium: 7.7 mg/dL — ABNORMAL LOW (ref 8.9–10.3)
Creatinine, Ser: 1.02 mg/dL (ref 0.61–1.24)
GFR calc Af Amer: >60 mL/min (ref >60)
GFR calc non Af Amer: >60 mL/min (ref >60)
GLUCOSE: 118 mg/dL — AB (ref 65–99)
Potassium: 3.4 mmol/L — ABNORMAL LOW (ref 3.5–5.1)
SODIUM: 137 mmol/L (ref 135–145)

## 2015-05-15 LAB — CBC
HEMATOCRIT: 33.1 % — AB (ref 40.0–52.0)
Hemoglobin: 11.1 g/dL — ABNORMAL LOW (ref 13.0–18.0)
MCH: 29.1 pg (ref 26.0–34.0)
MCHC: 33.4 g/dL (ref 32.0–36.0)
MCV: 87 fL (ref 80.0–100.0)
Platelets: 234 10*3/uL (ref 150–440)
RBC: 3.81 MIL/uL — ABNORMAL LOW (ref 4.40–5.90)
RDW: 14 % (ref 11.5–14.5)
WBC: 8 10*3/uL (ref 3.8–10.6)

## 2015-05-15 MED ORDER — POTASSIUM CHLORIDE 20 MEQ PO PACK
20.0000 meq | PACK | Freq: Three times a day (TID) | ORAL | Status: AC
  Administered 2015-05-15 (×3): 20 meq via ORAL
  Filled 2015-05-15 (×3): qty 1

## 2015-05-15 NOTE — Progress Notes (Signed)
   Subjective: 1 Day Post-Op Procedure(s) (LRB): TOTAL HIP REVISION (Left) Patient reports pain as 5 on 0-10 scale.   Patient is well, and has had no acute complaints or problems We will start therapy today.  Plan is to go Home after hospital stay. no nausea and no vomiting Patient denies any chest pains or shortness of breath. Objective: Vital signs in last 24 hours: Temp:  [97.4 F (36.3 C)-98.8 F (37.1 C)] 98 F (36.7 C) (08/09 0426) Pulse Rate:  [57-86] 76 (08/09 0426) Resp:  [12-20] 18 (08/09 0426) BP: (113-132)/(59-74) 121/63 mmHg (08/09 0426) SpO2:  [93 %-100 %] 94 % (08/09 0426) Weight:  [116.4 kg (256 lb 9.9 oz)] 116.4 kg (256 lb 9.9 oz) (08/08 1543) Patient still has original dressing in place. No drainage noted. Heels are non tender and elevated off the bed using rolled towels Intake/Output from previous day: 08/08 0701 - 08/09 0700 In: 2700 [I.V.:2700] Out: 2945 [Urine:1875; Drains:270; Blood:800] Intake/Output this shift:     Recent Labs  05/15/15 0531  HGB 11.1*    Recent Labs  05/15/15 0531  WBC 8.0  RBC 3.81*  HCT 33.1*  PLT 234    Recent Labs  05/15/15 0531  NA 137  K 3.4*  CL 102  CO2 28  BUN 13  CREATININE 1.02  GLUCOSE 118*  CALCIUM 7.7*   No results for input(s): LABPT, INR in the last 72 hours.  EXAM General - Patient is Alert, Appropriate and Oriented Extremity - Neurologically intact Neurovascular intact Sensation intact distally Intact pulses distally Dorsiflexion/Plantar flexion intact Dressing - dressing C/D/I Motor Function - intact, moving foot and toes well on exam.   Past Medical History  Diagnosis Date  . Nephrolithiasis   . High cholesterol   . 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 Hypertension     EKG,  chest  4/13 EPIC  . Anxiety     with diagnosis  . Gall stones   . 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  . Neuromuscular disorder     facial nerve pain    Assessment/Plan: 1 Day Post-Op  Procedure(s) (LRB): TOTAL HIP REVISION (Left) Active Problems:   S/P total hip arthroplasty  Estimated body mass index is 35.81 kg/(m^2) as calculated from the following:   Height as of this encounter: 5\' 11"  (1.803 m).   Weight as of this encounter: 116.4 kg (256 lb 9.9 oz). Advance diet D/C IV fluids Discharge home with home health Thursday  Labs: Were reviewed. Hypokalemia noted. Labs in a.m. DVT Prophylaxis - Lovenox, Foot Pumps and TED hose Weight-Bearing as tolerated to left leg D/C O2 and Pulse OX and try on Room Air Needs to start working on having a bowel movement Potassium ordered  Jon R. [place] 05/15/2015, 7:21 AM

## 2015-05-15 NOTE — Anesthesia Postprocedure Evaluation (Signed)
  Anesthesia Post-op Note  Patient: Bryan Frey  Procedure(s) Performed: Procedure(s): TOTAL HIP REVISION (Left)  Anesthesia type:Spinal  Patient location: PACU  Post pain: Pain level controlled  Post assessment: Post-op Vital signs reviewed, Patient's Cardiovascular Status Stable, Respiratory Function Stable, Patent Airway and No signs of Nausea or vomiting  Post vital signs: Reviewed and stable  Last Vitals:  Filed Vitals:   05/15/15 0725  BP: 122/63  Pulse: 78  Temp: 36.9 C  Resp: 16    Level of consciousness: awake, alert  and patient cooperative  Complications: No apparent anesthesia complications

## 2015-05-15 NOTE — Progress Notes (Signed)
Physical Therapy Treatment Patient Details Name: Bryan Frey MRN: [id number] DOB: 11-Dec-1945 Today's Date: 05/15/2015    History of Present Illness Pt underwent L THR revision posterior approach and is POD#1 at time of evaluation. No reported post-op complications.    PT Comments    Pt demonstrates limited assistance needed for mobility and ambulation. He manages pain well and is able to ambulate to the hallway and back to recliner. Ambulation distance limited during AM session by therapist but will progress in PM as pt tolerates. Pt will be safe to return home with HHPT. Pt will benefit from skilled PT services to address deficits in strength, balance, and mobility in order to return to full function at home.    Follow Up Recommendations  Home health PT     Equipment Recommendations  None recommended by PT    Recommendations for Other Services       Precautions / Restrictions Precautions Precautions: Posterior Hip (Left) Precaution Booklet Issued: Yes (comment) Restrictions Weight Bearing Restrictions: Yes LLE Weight Bearing: Weight bearing as tolerated    Mobility  Bed Mobility Overal bed mobility: Modified Independent             General bed mobility comments: HOB elevated, bed rails  Transfers Overall transfer level: Needs assistance Equipment used: Rolling walker (2 wheeled) Transfers: Sit to/from Stand Sit to Stand: Min guard         General transfer comment: Pt requires cues to avoid excessive L hip flexion during transfer as well as for safe hand placement. Overall decent speed and and stability noted  Ambulation/Gait Ambulation/Gait assistance: Min guard Ambulation Distance (Feet): 30 Feet Assistive device: Rolling walker (2 wheeled) Gait Pattern/deviations: Step-to pattern;Decreased step length - right;Decreased stance time - left;Decreased weight shift to left;Antalgic   Gait velocity interpretation: <1.8 ft/sec, indicative of risk for  recurrent falls General Gait Details: Cues for proper sequencing with walker. Education regarding L turns to avoid L hip IR. Pt needs continual reinforcement with poor carryover   Stairs            Wheelchair Mobility    Modified Rankin (Stroke Patients Only)       Balance Overall balance assessment: Needs assistance   Sitting balance-Leahy Scale: Good       Standing balance-Leahy Scale: Fair                      Cognition Arousal/Alertness: Awake/alert Behavior During Therapy: WFL for tasks assessed/performed Overall Cognitive Status: Within Functional Limits for tasks assessed                      Exercises Total Joint Exercises Ankle Circles/Pumps: Strengthening;Both;10 reps;Supine Quad Sets: Strengthening;Both;10 reps;Supine Gluteal Sets: Strengthening;Both;10 reps;Supine Towel Squeeze: Strengthening;Both;10 reps;Supine Hip ABduction/ADduction: Strengthening;Both;10 reps;Supine Straight Leg Raises: Strengthening;Both;10 reps;Supine Long Arc Quad: Strengthening;Left;10 reps;Seated    General Comments        Pertinent Vitals/Pain Pain Assessment: 0-10 Pain Score: 3  Pain Location: L hip (Increases to 7/10 with ambulation, decreases back to 3/10 ) Pain Intervention(s): Monitored during session;Premedicated before session;Repositioned    Home Living Family/patient expects to be discharged to:: Private residence Living Arrangements: Spouse/significant other Available Help at Discharge: Family Type of Home: House Home Access: Stairs to enter Entrance Stairs-Rails: Right;Left Home Layout: One level Home Equipment: Environmental consultant - 2 wheels;Walker - 4 wheels;Cane - single point;Shower seat - built in      Prior Function Level of Independence: Independent with  assistive device(s) (prn use of spc)          PT Goals (current goals can now be found in the care plan section) Acute Rehab PT Goals Patient Stated Goal: "I want to be able to walk  without a cane" PT Goal Formulation: With patient Time For Goal Achievement: 05/29/15 Potential to Achieve Goals: Good    Frequency  BID    PT Plan      Co-evaluation             End of Session Equipment Utilized During Treatment: Gait belt Activity Tolerance: Patient tolerated treatment well Patient left: in chair;with call bell/phone within reach;with chair alarm set;with SCD's reapplied (Pillow between knees for abduction)     Time: 1962-2297 PT Time Calculation (min) (ACUTE ONLY): 30 min  Charges:  $Therapeutic Exercise: 8-22 mins                    G Codes:      Lyndel Safe Huprich PT, DPT   Huprich,Jason 05/15/2015, 9:33 AM

## 2015-05-15 NOTE — Care Management Note (Addendum)
Case Management Note  Patient Details  Name: Bryan Frey MRN: [id number] Date of Birth: 06/15/1946  Subjective/Objective:                  Met with patient and his wife to discuss discharge planning. He would like to use [facility] like before. He still have a front-wheeled walker, bedside commode, and chair in shower. He uses [facility]  [phone] for Rx. He states he would like to return home at discharge. PT evaluation pending.  Action/Plan: List of home health care agencies provided. Notified Jerry/Tim with [facility] of patient referral. Lovenox $RemoveBeforeDEI'40mg'[id number]$  # 14 called in to [facility] for price. RNCM will continue to follow   Expected Discharge Date:                  Expected Discharge Plan:     In-House Referral:     Discharge planning Services  CM Consult  Post Acute Care Choice:  Home Health Choice offered to:  Patient, Spouse  DME Arranged:    DME Agency:  NA  HH Arranged:  PT HH Agency:  [facility]  Status of Service:  In process, will continue to follow  Medicare Important Message Given:    Date Medicare IM Given:    Medicare IM give by:    Date Additional Medicare IM Given:    Additional Medicare Important Message give by:     If discussed at [place] of Stay Meetings, dates discussed:    Additional Comments: Lovenox $22.29.  Marshell Garfinkel, RN 05/15/2015, 10:40 AM

## 2015-05-15 NOTE — Progress Notes (Signed)
OT Cancellation Note  Patient Details Name: Bryan Frey MRN: [id number] DOB: 15-Jul-1946   Cancelled Treatment:    Reason Eval/Treat Not Completed: Other (comment)  This is patient's 3 hip replacement and knows precautions and how to use hip kit. May see tomorrow for full dressing.  Sharon Mt 05/15/2015, 9:45 AM

## 2015-05-15 NOTE — Progress Notes (Signed)
Clinical Education officer, museum (CSW) received SNF consult. PT is recommending home health. RN Case Manger aware of above. Please reconsult if future social work needs arise. CSW signing off.   Blima Rich, [place] [phone]

## 2015-05-15 NOTE — Progress Notes (Signed)
Physical Therapy Treatment Patient Details Name: Bryan Frey MRN: [id number] DOB: [birth year] Today's Date: 05/15/2015    History of Present Illness Pt underwent L THR revision posterior approach and is POD#1 at time of evaluation. No reported post-op complications.    PT Comments    Increased ambulation distance this PM, pt able to demostrate step to with occasional step through pattern and slow cadence.  Observed moderate use of UE force on RW.  Pt c/o increase in pain up to 7/10 during ambulation but resolved to 4/10 at rest.  Pt required cues to avoid IR when turing to R.  Will continue to progress as tolerated.   Follow Up Recommendations  Home health PT     Equipment Recommendations  None recommended by PT    Recommendations for Other Services       Precautions / Restrictions Precautions Precautions: Posterior Hip Restrictions Weight Bearing Restrictions: Yes LLE Weight Bearing: Weight bearing as tolerated    Mobility  Bed Mobility Overal bed mobility: Modified Independent             General bed mobility comments: Bed rails for repositioning  Transfers Overall transfer level: Needs assistance Equipment used: Rolling walker (2 wheeled) Transfers: Sit to/from Stand Sit to Stand: Min guard         General transfer comment: good awareness of avoiding excessive hip flexion with sit to stand trf  Ambulation/Gait Ambulation/Gait assistance: Min guard Ambulation Distance (Feet): 90 Feet Assistive device: Rolling walker (2 wheeled) Gait Pattern/deviations: Step-to pattern;Step-through pattern     General Gait Details: cues for avoiding IR when turning to R   Stairs            Wheelchair Mobility    Modified Rankin (Stroke Patients Only)       Balance Overall balance assessment: Needs assistance Sitting-balance support: Feet supported Sitting balance-Leahy Scale: Good       Standing balance-Leahy Scale: Fair                      Cognition Arousal/Alertness: Awake/alert Behavior During Therapy: WFL for tasks assessed/performed Overall Cognitive Status: Within Functional Limits for tasks assessed                      Exercises Total Joint Exercises Ankle Circles/Pumps: AROM;Strengthening;Both;10 reps Quad Sets: Strengthening;Both;10 reps;Supine Towel Squeeze: Strengthening;Both;10 reps;Supine Short Arc Quad: Strengthening;Both;10 reps;Supine Straight Leg Raises: AROM;Strengthening;Left;10 reps;Standing Long Arc Quad: Strengthening;Both;10 reps;Seated Knee Flexion: AROM;Strengthening;Left;10 reps;Standing    General Comments        Pertinent Vitals/Pain Pain Assessment: 0-10 Pain Score: 4  Pain Location: L hip Pain Intervention(s): Limited activity within patient's tolerance;Monitored during session    Home Living                      Prior Function            PT Goals (current goals can now be found in the care plan section) Acute Rehab PT Goals Patient Stated Goal: "I want to be able to walk without a cane" PT Goal Formulation: With patient Time For Goal Achievement: 05/29/15 Potential to Achieve Goals: Good    Frequency  BID    PT Plan      Co-evaluation             End of Session Equipment Utilized During Treatment: Gait belt Activity Tolerance: Patient tolerated treatment well;Patient limited by pain Patient left: in bed;with call bell/phone  within reach;with bed alarm set;with family/visitor present     Time: 0104-0133 PT Time Calculation (min) (ACUTE ONLY): 29 min  Charges:  $Gait Training: 8-22 mins $Therapeutic Exercise: 8-22 mins                    G Codes:      Timtohy Broski 25-May-2015, 1:41 PM  Alanta Scobey  Leone Mobley, PTA

## 2015-05-16 ENCOUNTER — Encounter: Payer: Self-pay | Admitting: Orthopedic Surgery

## 2015-05-16 LAB — BASIC METABOLIC PANEL
ANION GAP: 8 (ref 5–15)
BUN: 12 mg/dL (ref 6–20)
CO2: 30 mmol/L (ref 22–32)
Calcium: 7.9 mg/dL — ABNORMAL LOW (ref 8.9–10.3)
Chloride: 101 mmol/L (ref 101–111)
Creatinine, Ser: 0.99 mg/dL (ref 0.61–1.24)
GFR calc Af Amer: >60 mL/min (ref >60)
Glucose, Bld: 117 mg/dL — ABNORMAL HIGH (ref 65–99)
POTASSIUM: 3.6 mmol/L (ref 3.5–5.1)
SODIUM: 139 mmol/L (ref 135–145)

## 2015-05-16 LAB — CBC
HCT: 32.4 % — ABNORMAL LOW (ref 40.0–52.0)
HEMOGLOBIN: 11.2 g/dL — AB (ref 13.0–18.0)
MCH: 29.9 pg (ref 26.0–34.0)
MCHC: 34.5 g/dL (ref 32.0–36.0)
MCV: 86.5 fL (ref 80.0–100.0)
Platelets: 226 10*3/uL (ref 150–440)
RBC: 3.75 MIL/uL — AB (ref 4.40–5.90)
RDW: 14.2 % (ref 11.5–14.5)
WBC: 8.8 10*3/uL (ref 3.8–10.6)

## 2015-05-16 NOTE — Progress Notes (Signed)
   Subjective: 2 Days Post-Op Procedure(s) (LRB): TOTAL HIP REVISION (Left) Patient reports pain as 5 on 0-10 scale.   Patient is well, and has had no acute complaints or problems We'll continue with physical therapy today.  Plan is to go Home after hospital stay. no nausea and no vomiting Patient denies any chest pains or shortness of breath. Objective: Vital signs in last 24 hours: Temp:  [98 F (36.7 C)-98.9 F (37.2 C)] 98.4 F (36.9 C) (08/10 0409) Pulse Rate:  [72-90] 83 (08/10 0409) Resp:  [14-19] 18 (08/10 0409) BP: (106-129)/(50-64) 129/57 mmHg (08/10 0409) SpO2:  [92 %-96 %] 94 % (08/10 0409) well approximated incision Heels are non tender and elevated off the bed using rolled towels Intake/Output from previous day: 08/09 0701 - 08/10 0700 In: 2120 [P.O.:480; I.V.:1640] Out: 1600 [Urine:1450; Drains:150] Intake/Output this shift: Total I/O In: -  Out: 650 [Urine:600; Drains:50]   Recent Labs  05/15/15 0531 05/16/15 0441  HGB 11.1* 11.2*    Recent Labs  05/15/15 0531 05/16/15 0441  WBC 8.0 8.8  RBC 3.81* 3.75*  HCT 33.1* 32.4*  PLT 234 226    Recent Labs  05/15/15 0531 05/16/15 0441  NA 137 139  K 3.4* 3.6  CL 102 101  CO2 28 30  BUN 13 12  CREATININE 1.02 0.99  GLUCOSE 118* 117*  CALCIUM 7.7* 7.9*   No results for input(s): LABPT, INR in the last 72 hours.  EXAM General - Patient is Alert, Appropriate and Oriented Extremity - Neurologically intact Neurovascular intact Sensation intact distally Intact pulses distally Dorsiflexion/Plantar flexion intact Dressing - dressing C/D/I Motor Function - intact, moving foot and toes well on exam.   Past Medical History  Diagnosis Date  . Nephrolithiasis   . High cholesterol   . 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 Hypertension     EKG,  chest  4/13 EPIC  . Anxiety     with diagnosis  . Gall stones   . 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  . Neuromuscular disorder     facial nerve  pain    Assessment/Plan: 2 Days Post-Op Procedure(s) (LRB): TOTAL HIP REVISION (Left) Active Problems:   S/P total hip arthroplasty  Estimated body mass index is 35.81 kg/(m^2) as calculated from the following:   Height as of this encounter: 5\' 11"  (2.119 m).   Weight as of this encounter: 116.4 kg (256 lb 9.9 oz). Up with therapy Plan for discharge tomorrow Discharge home with home health  Labs: Were reviewed on today's visit. DVT Prophylaxis - Lovenox, Foot Pumps and TED hose Weight-Bearing as tolerated to left leg Patient needs a bowel movement today Hemovac was discontinued on today's visit    Bryan Frey R. [place] 05/16/2015, 6:53 AM

## 2015-05-16 NOTE — Progress Notes (Signed)
OT Cancellation Note  Patient Details Name: Bryan Frey MRN: [id number] DOB: January 05, 1946   Cancelled Treatment:    Reason Eval/Treat Not Completed: Other (comment). Paient reports this is his 3rd hip and he knows how to use hip kit. He assured me he would use it.  Sharon Mt 05/16/2015, 2:55 PM

## 2015-05-16 NOTE — Progress Notes (Signed)
Physical Therapy Treatment Patient Details Name: Bryan Frey MRN: [id number] DOB: [birth year] Today's Date: 05/16/2015    History of Present Illness Pt underwent L THR revision posterior approach and is POD#1 at time of evaluation. No reported post-op complications.     PT Comments    Pt reports tightness in the L hip secondary to sitting down in the recliner chair this AM and wanted to "move his hip around." Pt was able to complete standing therex (heel raises/toe touches on trash can) with min assist using a RW in preparation for stair climbing.  Pt was able to ambulate to and from the stairs (144ft) with min assist using a RW and complete ascending/descending 4 steps with bilat rail/min assist in order to progress his goal of ascending 2 steps with no reported increase in L hip pain.  Pt would benefit from skilled PT in order to improve with transfers, increase ambulation distance, increase L hip ROM/strength, and decrease L hip pain.      Follow Up Recommendations  Home health PT     Equipment Recommendations  Rolling walker with 5" wheels    Recommendations for Other Services       Precautions / Restrictions Precautions Precautions: Posterior Hip Restrictions Weight Bearing Restrictions: Yes LLE Weight Bearing: Weight bearing as tolerated    Mobility  Bed Mobility         Supine to sit:  (For safety purposes)        Transfers Overall transfer level: Needs assistance Equipment used: Rolling walker (2 wheeled) Transfers: Sit to/from Stand Sit to Stand: Min guard (For safety purposes. )         General transfer comment: PT intermittently required verbal cuing to avoid breaking hip flex precaution.   Ambulation/Gait Ambulation/Gait assistance: Min guard Ambulation Distance (Feet): 120 Feet Assistive device: Rolling walker (2 wheeled) Gait Pattern/deviations: Step-through pattern;Decreased stance time - right;Scissoring   Gait velocity interpretation:  <1.8 ft/sec, indicative of risk for recurrent falls General Gait Details: Verbal cuing required to keep RW close to body and not to cease gait during L stance phase.    Stairs Stairs: Yes Stairs assistance: Min assist Stair Management: Two rails Number of Stairs: 4 General stair comments: Pt able to ascend descend stairs with no reported pain/episodes of buckling.   Wheelchair Mobility    Modified Rankin (Stroke Patients Only)       Balance Overall balance assessment: Needs assistance Sitting-balance support: Feet supported Sitting balance-Leahy Scale: Good Sitting balance - Comments: Pt able to complete hip marches/LAQ with no back support in the reclining chair.    Standing balance support: Bilateral upper extremity supported;Single extremity supported Standing balance-Leahy Scale: Good Standing balance comment: Pt able to complete standing heel raises/toe touches on the top of the trash cans with min assist using a RW.                    Cognition Arousal/Alertness: Awake/alert Behavior During Therapy: WFL for tasks assessed/performed Overall Cognitive Status: Within Functional Limits for tasks assessed                      Exercises Other Exercises Other Exercises: Seated bilat hip marches/resisted knee ext/resisted knee flex/dynamic reversal hip abd, 1 x 10 with verbal cuing to avoid breaking hip flex precautions.  Other Exercises: Standing bilat heel raises/toe touches on trash can with min assist using a RW, 1 x 10, verbal cuing required to avoid using heavy UE  compensation and just to use the RW for balance.      General Comments        Pertinent Vitals/Pain Pain Assessment: 0-10 Pain Score: 7  Pain Location: L hip Pain Descriptors / Indicators: Discomfort;Sore Pain Intervention(s): Limited activity within patient's tolerance;Monitored during session    Home Living                      Prior Function            PT Goals  (current goals can now be found in the care plan section) Acute Rehab PT Goals Patient Stated Goal: "I want to be able to walk without a cane" PT Goal Formulation: With patient Time For Goal Achievement: 05/29/15 Potential to Achieve Goals: Good Progress towards PT goals: Progressing toward goals    Frequency  BID    PT Plan Current plan remains appropriate    Co-evaluation             End of Session Equipment Utilized During Treatment: Gait belt Activity Tolerance: Patient tolerated treatment well;Patient limited by pain Patient left: with call bell/phone within reach;in chair;with chair alarm set     Time: 8675-4492 PT Time Calculation (min) (ACUTE ONLY): 23 min  Charges:                       G CodesBernestine Amass, SPT 2015/05/17 3:30 PM

## 2015-05-16 NOTE — Care Management Important Message (Signed)
Important Message  Patient Details  Name: LONALD TROIANI MRN: [id number] Date of Birth: Feb 27, 1946   Medicare Important Message Given:  Yes-second notification given    Juliann Pulse A [place] 05/16/2015, 11:16 AM

## 2015-05-16 NOTE — Progress Notes (Signed)
Physical Therapy Treatment Patient Details Name: DEONDRA WIGGER MRN: [id number] DOB: 29-Aug-1946 Today's Date: 05/16/2015    History of Present Illness Pt underwent L THR revision posterior approach and is POD#1 at time of evaluation. No reported post-op complications.     PT Comments    PT was ready to get OOB and move around a little bit.  Pt regressed to min assist with bed mobility and required min assist/HHA to sit on EOB.  Pt was able to stand up with min assist/RW on first attempt, towards the end of the session PT wanted to don his shorts, stood up without assistive device (min assist) without any UE support, PT instructed pt that it would currently be safer to use RW for donning, but that it was safe because PT was there assessing.  Pt was able to ambulate for 134ft this session with min assist using a RW and during ambulation progressed from step to to step through stepping pattern, reported 9/10 pain with WB on L hip but didn't display any signs of grimacing.  Pt would benefit from skilled PT in order to decrease L hip pain, increase L hip ROM/strength, and in crease ambulation distance.      Follow Up Recommendations  Home health PT     Equipment Recommendations  Rolling walker with 5" wheels    Recommendations for Other Services       Precautions / Restrictions Precautions Precautions: Posterior Hip Precaution Booklet Issued: Yes (comment) Restrictions Weight Bearing Restrictions: Yes LLE Weight Bearing: Weight bearing as tolerated    Mobility  Bed Mobility Overal bed mobility: Needs Assistance Bed Mobility: Supine to Sit     Supine to sit: Min assist     General bed mobility comments: Bed rails for repositioning, also requested HHA from PT.  Transfers Overall transfer level: Needs assistance Equipment used: Rolling walker (2 wheeled) Transfers: Sit to/from Stand Sit to Stand: Min guard         General transfer comment: PT intermittently required  verbal cuing to avoid breaking hip flex precaution.   Ambulation/Gait Ambulation/Gait assistance: Min guard Ambulation Distance (Feet): 180 Feet Assistive device: Rolling walker (2 wheeled) Gait Pattern/deviations: Step-to pattern;Step-through pattern;Decreased stance time - left;Scissoring   Gait velocity interpretation: <1.8 ft/sec, indicative of risk for recurrent falls General Gait Details: cues for avoiding IR when turning to R, pt was instructed to take small steps with turning   Stairs            Wheelchair Mobility    Modified Rankin (Stroke Patients Only)       Balance Overall balance assessment: Needs assistance Sitting-balance support: Feet supported Sitting balance-Leahy Scale: Good Sitting balance - Comments: Pt was able to don underwear while in seated position with min assist from PT.    Standing balance support: Bilateral upper extremity supported Standing balance-Leahy Scale: Fair Standing balance comment: Pt was able to don his shorts on while in standing position with min assist from PT.                     Cognition Arousal/Alertness: Awake/alert Behavior During Therapy: WFL for tasks assessed/performed Overall Cognitive Status: Within Functional Limits for tasks assessed                      Exercises Other Exercises Other Exercises: Supine bilat ankle pumps/heel slides with verbal cuing to avoid breaking hip flex precautions, 1 x 10.  Other Exercises: Seated in recliner  chair with chair slightly reclined to avoid breaking hip flex precautions bilat LAQ/hip marches, 1 x 10.    General Comments        Pertinent Vitals/Pain Pain Assessment: 0-10 Pain Score: 9  (9/10 pain with ambulation, 2 10 pain with resting, 4/10 oain with sitting/supine therex.) Pain Location: L hip Pain Descriptors / Indicators: Discomfort;Sore Pain Intervention(s): Limited activity within patient's tolerance;Monitored during session    Home Living                       Prior Function            PT Goals (current goals can now be found in the care plan section) Acute Rehab PT Goals Patient Stated Goal: "I want to be able to walk without a cane" PT Goal Formulation: With patient Time For Goal Achievement: 05/29/15 Potential to Achieve Goals: Good    Frequency  BID    PT Plan Current plan remains appropriate    Co-evaluation             End of Session Equipment Utilized During Treatment: Gait belt Activity Tolerance: Patient tolerated treatment well;Patient limited by pain Patient left: with call bell/phone within reach;with family/visitor present;in chair;with chair alarm set     Time: 0998-3382 PT Time Calculation (min) (ACUTE ONLY): 23 min  Charges:                       G Codes:      Bernestine Amass, SPT 06-04-15 9:57 AM

## 2015-05-17 MED ORDER — OXYCODONE HCL 5 MG PO TABS: 5.0000 mg | ORAL_TABLET | ORAL | Status: DC | PRN

## 2015-05-17 MED ORDER — ENOXAPARIN SODIUM 40 MG/0.4ML ~~LOC~~ SOLN: 40.0000 mg | SUBCUTANEOUS | Status: DC

## 2015-05-17 MED ORDER — TRAMADOL HCL 50 MG PO TABS: 50.0000 mg | ORAL_TABLET | ORAL | Status: DC | PRN

## 2015-05-17 NOTE — Care Management (Signed)
Patient discharging home today followed by [place] home health. I have notified Jerry/Tim with [facility] of patient discharge. No further RNCM needs. Case closed.

## 2015-05-17 NOTE — Progress Notes (Signed)
   Subjective: 3 Days Post-Op Procedure(s) (LRB): TOTAL HIP REVISION (Left) Patient reports pain as mild.   Patient is well, and has had no acute complaints or problems Continue with physical therapy today.  Plan is to go Home after hospital stay. no nausea and no vomiting Patient denies any chest pains or shortness of breath. Objective: Vital signs in last 24 hours: Temp:  [98.3 F (36.8 C)-98.8 F (37.1 C)] 98.8 F (37.1 C) (08/11 0342) Pulse Rate:  [85-100] 85 (08/11 0342) Resp:  [18] 18 (08/11 0342) BP: (123-135)/(56-67) 124/61 mmHg (08/11 0342) SpO2:  [91 %-98 %] 91 % (08/11 0342) well approximated incision Heels are non tender and elevated off the bed using rolled towels Intake/Output from previous day: 08/10 0701 - 08/11 0700 In: -  Out: 350 [Urine:350] Intake/Output this shift: Total I/O In: -  Out: 350 [Urine:350]   Recent Labs  05/15/15 0531 05/16/15 0441  HGB 11.1* 11.2*    Recent Labs  05/15/15 0531 05/16/15 0441  WBC 8.0 8.8  RBC 3.81* 3.75*  HCT 33.1* 32.4*  PLT 234 226    Recent Labs  05/15/15 0531 05/16/15 0441  NA 137 139  K 3.4* 3.6  CL 102 101  CO2 28 30  BUN 13 12  CREATININE 1.02 0.99  GLUCOSE 118* 117*  CALCIUM 7.7* 7.9*   No results for input(s): LABPT, INR in the last 72 hours.  EXAM General - Patient is Alert, Appropriate and Oriented Extremity - Neurologically intact ABD soft Neurovascular intact Sensation intact distally Intact pulses distally Dorsiflexion/Plantar flexion intact Dressing - dressing C/D/I Motor Function - intact, moving foot and toes well on exam.   Past Medical History  Diagnosis Date  . Nephrolithiasis   . High cholesterol   . 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 Hypertension     EKG,  chest  4/13 EPIC  . Anxiety     with diagnosis  . Gall stones   . 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  . Neuromuscular disorder     facial nerve pain    Assessment/Plan: 3 Days Post-Op Procedure(s)  (LRB): TOTAL HIP REVISION (Left) Active Problems:   S/P total hip arthroplasty  Estimated body mass index is 35.81 kg/(m^2) as calculated from the following:   Height as of this encounter: 5\' 11"  (1.803 m).   Weight as of this encounter: 116.4 kg (256 lb 9.9 oz). Up with therapy Discharge home with home health after physical therapy this a.m.  Labs: None DVT Prophylaxis - Lovenox, Foot Pumps and TED hose Weight-Bearing as tolerated to left leg  Please change dressing prior to discharge today.  Jillyn Ledger. [place] 05/17/2015, 6:45 AM

## 2015-05-17 NOTE — Discharge Summary (Signed)
Physician Discharge Summary  Patient ID: AKHIL PISCOPO MRN: [id number] DOB/AGE: 01/21/1946 69 y.o.  Admit date: 05/14/2015 Discharge date: 05/17/2015  Admission Diagnoses:  STATUS POST LEFT TOTAL HIP REPLACEMENT   Discharge Diagnoses: Patient Active Problem List   Diagnosis Date Noted  . 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 Renal carcinoma   . Primary renal papillary carcinoma   . Calculus of kidney 02/09/2014  . Nephrolithiasis   . High cholesterol     Past Medical History  Diagnosis Date  . Nephrolithiasis   . High cholesterol   . 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 Hypertension     EKG,  chest  4/13 EPIC  . Anxiety     with diagnosis  . Gall stones   . 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  . Neuromuscular disorder     facial nerve pain     Transfusion: There is no transfusions given doing this admission.   Consultants (if any):   case management  Discharged Condition: Improved  Hospital Course: Bryan Frey is an 69 y.o. male who was admitted 05/14/2015 with a diagnosis of loosening of left acetabular component status post left total hip arthroplasty 5 years ago and went to the operating room on 05/14/2015 and underwent the above named procedures.    Surgeries:Procedure(s): TOTAL HIP REVISION on 05/14/2015  ANESTHESIA: spinal  ESTIMATED BLOOD LOSS: 800 mL  FLUIDS REPLACED: 2400 mL of crystalloid  DRAINS: 2 medium drains to a Hemovac reservoir  IMPLANTS UTILIZED: DePuy 62 mm OD [employer] acetabular component, +4 mm neutral [employer] polyethylene insert, a 36 mm M-SPEC +1.5 mm hip ball, and 3 6.5 mm cancellous bone screws  INDICATIONS FOR SURGERY: Bryan Frey is a 69 y.o. year old male previously underwent a left total hip arthroplasty approximately 5 years ago. Recent radiographs demonstrated migration of the acetabular component suggestive of loosening. After discussion of the risks and benefits of surgical  intervention, the patient expressed understanding of the risks benefits and agree with plans for revision of the left acetabular component.   The risks, benefits, and alternatives were discussed at length including but not limited to the risks of infection, bleeding, nerve injury, stiffness, blood clots, the need for revision surgery, limb length inequality, dislocation, cardiopulmonary complications, among others, and they were willing to proceed. Patient tolerated the surgery well. No complications .Patient was taken to PACU where she was stabilized and then transferred to the orthopedic floor.  Patient started on Lovenox 30 mg q 12 hrs. Foot pumps applied bilaterally at 80 mm hg. Heels elevated off bed with rolled towels. No evidence of DVT. Calves non tender. Negative Homan. Physical therapy started on day #1 for gait training and transfer with OT starting on  day #1 for ADL and assisted devices. Patient has done well with therapy. Ambulated 200 feet upon being discharged. Was able to go up 4 steps independently. Able to get in and out of bed safely without any restrictions. Could recite hip precautions.  Patient's IV , foley and hemovac was d/c on day #2.   He was given perioperative antibiotics:  Anti-infectives    Start     Dose/Rate Route Frequency Ordered Stop   05/14/15 1800  ceFAZolin (ANCEF) IVPB 2 g/50 mL premix     2 g 100 mL/hr over 30 Minutes Intravenous Every 6 hours 05/14/15 1531 05/15/15 1410   05/14/15 0630  ceFAZolin (ANCEF)  IVPB 2 g/50 mL premix     2 g 100 mL/hr over 30 Minutes Intravenous  Once 05/14/15 0620 05/14/15 1207   05/14/15 0553  ceFAZolin (ANCEF) 2-3 GM-% IVPB SOLR    Comments:  Ronnell Freshwater: cabinet override      05/14/15 0553 05/14/15 1759    .  He was given sequential compression devices, early ambulation, and TED stockings and Lovenox for DVT prophylaxis.  He benefited maximally from the hospital stay and there were no complications.    Recent  vital signs:  Filed Vitals:   05/17/15 0342  BP: 124/61  Pulse: 85  Temp: 98.8 F (37.1 C)  Resp: 18    Recent laboratory studies:  Lab Results  Component Value Date   HGB 11.2* 05/16/2015   HGB 11.1* 05/15/2015   HGB 14.0 05/01/2015   Lab Results  Component Value Date   WBC 8.8 05/16/2015   PLT 226 05/16/2015   Lab Results  Component Value Date   INR 0.99 05/01/2015   Lab Results  Component Value Date   NA 139 05/16/2015   K 3.6 05/16/2015   CL 101 05/16/2015   CO2 30 05/16/2015   BUN 12 05/16/2015   CREATININE 0.99 05/16/2015   GLUCOSE 117* 05/16/2015    Discharge Medications:     Medication List    STOP taking these medications        aspirin EC 81 MG tablet     naproxen sodium 220 MG tablet  Commonly known as:  ANAPROX      TAKE these medications        amLODipine 10 MG tablet  Commonly known as:  NORVASC  Take 10 mg by mouth every morning.     carbamazepine 200 MG tablet  Commonly known as:  TEGRETOL  Take 300 mg by mouth at bedtime.     carbamazepine 200 MG tablet  Commonly known as:  TEGRETOL  Take 200 mg by mouth every morning. Pt. Takes 1.5 tablets (300 mg) at bedtime     diphenhydramine-acetaminophen 25-500 MG Tabs  Commonly known as:  TYLENOL PM  Take 1 tablet by mouth at bedtime as needed.     enoxaparin 40 MG/0.4ML injection  Commonly known as:  LOVENOX  Inject 0.4 mLs (40 mg total) into the skin daily.     hydrochlorothiazide 25 MG tablet  Commonly known as:  HYDRODIURIL  Take 25 mg by mouth daily.     oxyCODONE 5 MG immediate release tablet  Commonly known as:  Oxy IR/ROXICODONE  Take 1-2 tablets (5-10 mg total) by mouth every 4 (four) hours as needed for severe pain.     traMADol 50 MG tablet  Commonly known as:  ULTRAM  Take 1 tablet (50 mg total) by mouth every 6 (six) hours as needed for moderate pain. For pain     traMADol 50 MG tablet  Commonly known as:  ULTRAM  Take 1-2 tablets (50-100 mg total) by mouth every  4 (four) hours as needed for moderate pain.        Diagnostic Studies: Dg Hip Port Unilat With Pelvis 1v Left  05/14/2015   CLINICAL DATA:  Revision left total hip  EXAM: DG HIP (WITH OR WITHOUT PELVIS) 1V PORT LEFT  COMPARISON:  05/09/2015  FINDINGS: Left hip replacement. Question posterior rotation of the acetabulum. Normal alignment. Negative for acute fracture.  IMPRESSION: Left hip replacement. Acetabular prosthesis appears rotated posteriorly. Followup imaging suggested.   Electronically Signed   By:  Franchot Gallo M.D.   On: 05/14/2015 15:37    Disposition:       Discharge Instructions    Diet - low sodium heart healthy    Complete by:  As directed      Increase activity slowly    Complete by:  As directed            Follow-up Information    Follow up with Dereck Leep, MD On 07/10/2015.   Specialty:  Orthopedic Surgery   Why:  Appointment is at 9:15   Contact information:   [place] [id number] [phone]        Signed: Watt Climes 05/17/2015, 6:55 AM

## 2015-05-17 NOTE — Discharge Instructions (Signed)

## 2015-05-17 NOTE — Progress Notes (Signed)
Physical Therapy Treatment Patient Details Name: Bryan Frey MRN: [id number] DOB: 18-May-1946 Today's Date: 05/17/2015    History of Present Illness Pt underwent L THR revision posterior approach and is POD#1 at time of evaluation. No reported post-op complications.     PT Comments    Pt knows he is going to be discharged soon and agreed to complete one more session involving higher level therex and education before he returns home. Pt was able to ambulate for 277ft with less reported pain today (6/10) and was able to progress to standing therex (standing hip ext/forward stepping).  Pt was educated on sitting in passenger seat of his car without breaking hip precautions and therex that he can complete at home.  Pt would continue to benefit from skilled PT in order to increase L hip ROM/strength, decrease L hip pain, and improve with ambulation/transfers with less assist.      Follow Up Recommendations  Home health PT     Equipment Recommendations  Rolling walker with 5" wheels    Recommendations for Other Services       Precautions / Restrictions Precautions Precautions: Posterior Hip Precaution Booklet Issued: Yes (comment) Restrictions Weight Bearing Restrictions: Yes LLE Weight Bearing: Weight bearing as tolerated    Mobility  Bed Mobility                  Transfers Overall transfer level: Needs assistance Equipment used: Rolling walker (2 wheeled) Transfers: Sit to/from Stand Sit to Stand: Min guard         General transfer comment: PT intermittently required verbal cuing to avoid breaking hip flex precaution.   Ambulation/Gait Ambulation/Gait assistance: Min guard Ambulation Distance (Feet): 200 Feet Assistive device: Rolling walker (2 wheeled) Gait Pattern/deviations: WFL(Within Functional Limits)   Gait velocity interpretation: <1.8 ft/sec, indicative of risk for recurrent falls General Gait Details: Verbal cuing required to keep RW close to  body and not to cease gait during L stance phase.    Stairs            Wheelchair Mobility    Modified Rankin (Stroke Patients Only)       Balance Overall balance assessment: Needs assistance Sitting-balance support: Feet supported Sitting balance-Leahy Scale: Good Sitting balance - Comments: Pt able to complete seated hip marches/resisted hip abd with no back support in the reclining chair.    Standing balance support: Bilateral upper extremity supported;Single extremity supported Standing balance-Leahy Scale: Good Standing balance comment: Pt able to complete standing hip ext/forward stepping with partial lunges from stance leg with min assist using a RW.                     Cognition Arousal/Alertness: Awake/alert Behavior During Therapy: WFL for tasks assessed/performed Overall Cognitive Status: Within Functional Limits for tasks assessed                      Exercises Other Exercises Other Exercises: Seated bilat resisted hip abd/hip marches, 1 x 10.  Other Exercises: Standing with min assist using a RW bilat hip ext/forward stepping with verbal cuing for partial lunges with stance leg, 1 x 10.     General Comments        Pertinent Vitals/Pain Pain Assessment: 0-10 Pain Score: 6  Pain Location: L hip Pain Descriptors / Indicators: Discomfort;Sore Pain Intervention(s): Limited activity within patient's tolerance;Monitored during session    Home Living  Prior Function            PT Goals (current goals can now be found in the care plan section) Acute Rehab PT Goals Patient Stated Goal: "I want to be able to walk without a cane" PT Goal Formulation: With patient Time For Goal Achievement: 05/29/15 Potential to Achieve Goals: Good Progress towards PT goals: Progressing toward goals    Frequency  BID    PT Plan Current plan remains appropriate    Co-evaluation             End of Session Equipment  Utilized During Treatment: Gait belt Activity Tolerance: Patient tolerated treatment well;Patient limited by pain Patient left: with call bell/phone within reach;in chair;with chair alarm set;with family/visitor present     Time: 1610-9604 PT Time Calculation (min) (ACUTE ONLY): 16 min  Charges:                       G CodesBernestine Amass, SPT May 31, 2015 10:24 AM

## 2015-10-11 DIAGNOSIS — Z0000 Encounter for general adult medical examination without abnormal findings: Secondary | ICD-10-CM | POA: Diagnosis not present

## 2015-10-11 DIAGNOSIS — E784 Other hyperlipidemia: Secondary | ICD-10-CM | POA: Diagnosis not present

## 2015-10-11 DIAGNOSIS — Z79899 Other long term (current) drug therapy: Secondary | ICD-10-CM | POA: Diagnosis not present

## 2015-10-11 DIAGNOSIS — Z125 Encounter for screening for malignant neoplasm of prostate: Secondary | ICD-10-CM | POA: Diagnosis not present

## 2015-11-27 DIAGNOSIS — Z96649 Presence of unspecified artificial hip joint: Secondary | ICD-10-CM | POA: Diagnosis not present

## 2015-11-27 DIAGNOSIS — M25552 Pain in left hip: Secondary | ICD-10-CM | POA: Diagnosis not present

## 2016-01-24 DIAGNOSIS — M79605 Pain in left leg: Secondary | ICD-10-CM | POA: Diagnosis not present

## 2016-01-24 DIAGNOSIS — S76112A Strain of left quadriceps muscle, fascia and tendon, initial encounter: Secondary | ICD-10-CM | POA: Diagnosis not present

## 2016-01-29 DIAGNOSIS — Z96649 Presence of unspecified artificial hip joint: Secondary | ICD-10-CM | POA: Diagnosis not present

## 2016-01-29 DIAGNOSIS — S76112A Strain of left quadriceps muscle, fascia and tendon, initial encounter: Secondary | ICD-10-CM | POA: Diagnosis not present

## 2016-02-01 ENCOUNTER — Other Ambulatory Visit: Payer: Self-pay | Admitting: Orthopedic Surgery

## 2016-02-01 DIAGNOSIS — S76112A Strain of left quadriceps muscle, fascia and tendon, initial encounter: Secondary | ICD-10-CM

## 2016-02-11 ENCOUNTER — Ambulatory Visit
Admission: RE | Admit: 2016-02-11 | Discharge: 2016-02-11 | Disposition: A | Discharge status: Home / Self Care | Payer: PPO | Source: Ambulatory Visit | Attending: Orthopedic Surgery | Admitting: Orthopedic Surgery

## 2016-02-11 DIAGNOSIS — M79652 Pain in left thigh: Secondary | ICD-10-CM | POA: Diagnosis not present

## 2016-02-11 DIAGNOSIS — S76112A Strain of left quadriceps muscle, fascia and tendon, initial encounter: Secondary | ICD-10-CM | POA: Diagnosis not present

## 2016-02-11 DIAGNOSIS — Z96643 Presence of artificial hip joint, bilateral: Secondary | ICD-10-CM | POA: Diagnosis not present

## 2016-02-18 DIAGNOSIS — M4726 Other spondylosis with radiculopathy, lumbar region: Secondary | ICD-10-CM | POA: Diagnosis not present

## 2016-02-18 DIAGNOSIS — M5416 Radiculopathy, lumbar region: Secondary | ICD-10-CM | POA: Diagnosis not present

## 2016-02-18 DIAGNOSIS — M545 Low back pain: Secondary | ICD-10-CM | POA: Diagnosis not present

## 2016-02-18 DIAGNOSIS — M5136 Other intervertebral disc degeneration, lumbar region: Secondary | ICD-10-CM | POA: Diagnosis not present

## 2016-02-20 ENCOUNTER — Other Ambulatory Visit: Payer: Self-pay | Admitting: Orthopedic Surgery

## 2016-02-20 DIAGNOSIS — M4726 Other spondylosis with radiculopathy, lumbar region: Secondary | ICD-10-CM

## 2016-02-20 DIAGNOSIS — M5136 Other intervertebral disc degeneration, lumbar region: Secondary | ICD-10-CM

## 2016-02-20 DIAGNOSIS — M5416 Radiculopathy, lumbar region: Secondary | ICD-10-CM

## 2016-02-26 ENCOUNTER — Other Ambulatory Visit (HOSPITAL_COMMUNITY): Payer: Self-pay | Admitting: Interventional Radiology

## 2016-02-26 ENCOUNTER — Other Ambulatory Visit: Payer: Self-pay | Admitting: *Deleted

## 2016-02-26 DIAGNOSIS — C642 Malignant neoplasm of left kidney, except renal pelvis: Secondary | ICD-10-CM

## 2016-03-07 ENCOUNTER — Other Ambulatory Visit
Admission: RE | Admit: 2016-03-07 | Discharge: 2016-03-07 | Disposition: A | Discharge status: Home / Self Care | Payer: PPO | Source: Ambulatory Visit | Attending: Interventional Radiology | Admitting: Interventional Radiology

## 2016-03-07 ENCOUNTER — Ambulatory Visit
Admission: RE | Admit: 2016-03-07 | Discharge: 2016-03-07 | Disposition: A | Discharge status: Home / Self Care | Payer: PPO | Source: Ambulatory Visit | Attending: Orthopedic Surgery | Admitting: Orthopedic Surgery

## 2016-03-07 DIAGNOSIS — M2578 Osteophyte, vertebrae: Secondary | ICD-10-CM | POA: Insufficient documentation

## 2016-03-07 DIAGNOSIS — M4726 Other spondylosis with radiculopathy, lumbar region: Secondary | ICD-10-CM | POA: Insufficient documentation

## 2016-03-07 DIAGNOSIS — M4806 Spinal stenosis, lumbar region: Secondary | ICD-10-CM | POA: Diagnosis not present

## 2016-03-07 DIAGNOSIS — C642 Malignant neoplasm of left kidney, except renal pelvis: Secondary | ICD-10-CM | POA: Insufficient documentation

## 2016-03-07 DIAGNOSIS — M5136 Other intervertebral disc degeneration, lumbar region: Secondary | ICD-10-CM | POA: Insufficient documentation

## 2016-03-07 DIAGNOSIS — M5126 Other intervertebral disc displacement, lumbar region: Secondary | ICD-10-CM | POA: Insufficient documentation

## 2016-03-07 DIAGNOSIS — M5416 Radiculopathy, lumbar region: Secondary | ICD-10-CM | POA: Diagnosis not present

## 2016-03-07 LAB — CREATININE, SERUM
Creatinine, Ser: 1.19 mg/dL (ref 0.61–1.24)
GFR calc Af Amer: >60 mL/min (ref >60)
GFR calc non Af Amer: 60 mL/min — ABNORMAL LOW (ref >60)

## 2016-03-07 LAB — BUN: BUN: 20 mg/dL (ref 6–20)

## 2016-03-20 DIAGNOSIS — M5416 Radiculopathy, lumbar region: Secondary | ICD-10-CM | POA: Diagnosis not present

## 2016-03-20 DIAGNOSIS — M5136 Other intervertebral disc degeneration, lumbar region: Secondary | ICD-10-CM | POA: Diagnosis not present

## 2016-03-20 DIAGNOSIS — M4806 Spinal stenosis, lumbar region: Secondary | ICD-10-CM | POA: Diagnosis not present

## 2016-04-02 ENCOUNTER — Encounter (HOSPITAL_COMMUNITY): Payer: Self-pay

## 2016-04-02 ENCOUNTER — Ambulatory Visit
Admission: RE | Admit: 2016-04-02 | Discharge: 2016-04-02 | Disposition: A | Discharge status: Home / Self Care | Payer: PPO | Source: Ambulatory Visit | Attending: Interventional Radiology | Admitting: Interventional Radiology

## 2016-04-02 ENCOUNTER — Ambulatory Visit (HOSPITAL_COMMUNITY)
Admission: RE | Admit: 2016-04-02 | Discharge: 2016-04-02 | Disposition: A | Discharge status: Home / Self Care | Payer: PPO | Source: Ambulatory Visit | Attending: Interventional Radiology | Admitting: Interventional Radiology

## 2016-04-02 DIAGNOSIS — C642 Malignant neoplasm of left kidney, except renal pelvis: Secondary | ICD-10-CM | POA: Insufficient documentation

## 2016-04-02 DIAGNOSIS — N281 Cyst of kidney, acquired: Secondary | ICD-10-CM | POA: Diagnosis not present

## 2016-04-02 DIAGNOSIS — N200 Calculus of kidney: Secondary | ICD-10-CM | POA: Diagnosis not present

## 2016-04-02 DIAGNOSIS — I700 Atherosclerosis of aorta: Secondary | ICD-10-CM | POA: Insufficient documentation

## 2016-04-02 DIAGNOSIS — C649 Malignant neoplasm of unspecified kidney, except renal pelvis: Secondary | ICD-10-CM | POA: Diagnosis not present

## 2016-04-02 DIAGNOSIS — K8020 Calculus of gallbladder without cholecystitis without obstruction: Secondary | ICD-10-CM | POA: Diagnosis not present

## 2016-04-02 MED ORDER — IOPAMIDOL (ISOVUE-300) INJECTION 61%
100.0000 mL | Freq: Once | INTRAVENOUS | Status: AC | PRN
  Administered 2016-04-02: 100 mL via INTRAVENOUS

## 2016-04-02 NOTE — Progress Notes (Signed)
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  History of Present Illness: Bryan Frey is a 70 y.o. male who is now 4 years status post percutaneous cryoablation of a left renal papillary carcinoma. Since last follow-up for evaluation of the ablation site one year ago, he is status post revision of a left hip arthroplasty on 05/14/2015 by Dr. Marry Guan for loosening of the acetabular component. He states that he was ambulating quite well following revision then began to start to experience progressive difficulty with weakness in his left thigh resulting in gait abnormality and severely limiting his mobility. He is using a cane and walks with difficulty. 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 He is scheduled to see Dr. Ellene Route in just over 2 weeks.  Past Medical History  Diagnosis Date  . Nephrolithiasis   . High cholesterol   . 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 Hypertension     EKG,  chest  4/13 EPIC  . Anxiety     with diagnosis  . Gall stones   . 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  . Neuromuscular disorder ([place])     facial nerve pain    Past Surgical History  Procedure Laterality Date  . Lumbar laminectomy/decompression microdiscectomy  02/09/2012    Procedure: LUMBAR LAMINECTOMY/DECOMPRESSION MICRODISCECTOMY 2 LEVELS;  Surgeon: Kristeen Miss, MD;  Location: [facility];  Service: Neurosurgery;  Laterality: Bilateral;  Bilateral Lumbar two-three,lumbar four-five laminectomies  . Nephrolithotomy Right 02/09/2014    Procedure: NEPHROLITHOTOMY PERCUTANEOUS;  Surgeon: Franchot Gallo, MD;  Location: [facility];  Service: Urology;  Laterality: Right;  . Back surgery    . Right total hip arthroplasty  07/13/2014  . Left total hip arthroplasty  08/07/2010  . Total hip revision Left 05/14/2015    Procedure: TOTAL HIP REVISION;  Surgeon: Dereck Leep, MD;  Location:  [facility];  Service: Orthopedics;  Laterality: Left;    Allergies: Morphine and related  Medications: Prior to Admission medications   Medication Sig Start Date End Date Taking? Authorizing Provider  amLODipine (NORVASC) 10 MG tablet Take 10 mg by mouth every morning.    Yes Historical Provider, MD  carbamazepine (TEGRETOL) 200 MG tablet Take 200 mg by mouth every morning. Pt. Takes 1.5 tablets (300 mg) at bedtime   Yes Historical Provider, MD  carbamazepine (TEGRETOL) 200 MG tablet Take 300 mg by mouth at bedtime.   Yes Historical Provider, MD  diphenhydramine-acetaminophen (TYLENOL PM) 25-500 MG TABS Take 1 tablet by mouth at bedtime as needed.   Yes Historical Provider, MD  hydrochlorothiazide (HYDRODIURIL) 25 MG tablet Take 25 mg by mouth daily.    Yes Historical Provider, MD  oxyCODONE (OXY IR/ROXICODONE) 5 MG immediate release tablet Take 1-2 tablets (5-10 mg total) by mouth every 4 (four) hours as needed for severe pain. 05/17/15  Yes Watt Climes, PA  traMADol (ULTRAM) 50 MG tablet Take 1-2 tablets (50-100 mg total) by mouth every 4 (four) hours as needed for moderate pain. 05/17/15  Yes Watt Climes, PA  enoxaparin (LOVENOX) 40 MG/0.4ML injection Inject 0.4 mLs (40 mg total) into the skin daily. Patient not taking: Reported on 04/02/2016 05/17/15   Watt Climes, PA  traMADol (ULTRAM) 50 MG tablet Take 1 tablet (50 mg total) by mouth every 6 (six) hours as needed for moderate pain. For  pain Patient taking differently: Take 100 mg by mouth 2 (two) times daily as needed for moderate pain. For pain 02/11/14   Alexis Frock, MD     Family History  Problem Relation Age of Onset  . Anesthesia problems Neg Hx     Social History   Social History  . Marital Status: Married    Spouse Name: N/A  . Number of Children: N/A  . Years of Education: N/A   Social History Main Topics  . Smoking status: Never Smoker   . Smokeless tobacco: Never Used  . Alcohol Use: No  . Drug Use: No     Comment:  tramadol  . Sexual Activity: Not on file   Other Topics Concern  . Not on file   Social History Narrative     Review of Systems: A 12 point ROS discussed and pertinent positives are indicated in the HPI above.  All other systems are negative.  Review of Systems  Respiratory: Negative.   Cardiovascular: Negative.   Genitourinary: Negative.   Musculoskeletal:       Left thigh weakness and pain.  Neurological: Positive for weakness. Negative for dizziness.       Gait abnormality of left leg.    Vital Signs: BP 143/84 mmHg  Pulse 81  Temp(Src) 98.3 F (36.8 C) (Oral)  Resp 14  Ht '5\' 11"'$  (1.803 m)  Wt 245 lb (111.131 kg)  BMI 34.19 kg/m2  SpO2 96%  Physical Exam  Constitutional: He is oriented to person, place, and time.  Abdominal: Soft. He exhibits no distension and no mass. There is no tenderness. There is no rebound and no guarding.  Musculoskeletal: He exhibits no edema.  Atrophy of left thigh musculature and profound weakness of left hip flexors.  Neurological: He is alert and oriented to person, place, and time.    Imaging: Mr Lumbar Spine Wo Contrast  03/07/2016  CLINICAL DATA:  Low back pain for several years. Pain down the left leg. EXAM: MRI LUMBAR SPINE WITHOUT CONTRAST TECHNIQUE: Multiplanar, multisequence MR imaging of the lumbar spine was performed. No intravenous contrast was administered. COMPARISON:  None. FINDINGS: Segmentation:  Standard. Alignment:  8 mm of retrolisthesis of L2 on L3. Vertebrae:  No fracture, evidence of discitis, or bone lesion. Conus medullaris: Extends to the T12-L1 level and appears normal. Paraspinal and other soft tissues: Negative. Disc levels: Disc spaces: Degenerative disc disease with disc height loss at L2-3. T12-L1: No significant disc bulge. No evidence of neural foraminal stenosis. No central canal stenosis. L1-L2: Right lateral disc osteophyte complex. No evidence of neural foraminal stenosis. No central canal stenosis. L2-L3:  Broad-based disc osteophyte complex with severe bilateral facet arthropathy resulting in moderate-severe spinal stenosis and severe bilateral foraminal stenosis. L3-L4: Mild broad-based disc bulge. Severe bilateral facet arthropathy. Moderate spinal stenosis. Severe left foraminal stenosis. No right foraminal stenosis. L4-L5: Mild broad-based disc bulge. Severe bilateral facet arthropathy. Severe right and moderate left foraminal stenosis. L5-S1: No significant disc bulge. Severe left and moderate right facet arthropathy. Moderate left foraminal stenosis. Mild right foraminal stenosis. IMPRESSION: 1. 8 mm of retrolisthesis of L2 on L3. At L2-3 there is a broad-based disc osteophyte complex with severe bilateral facet arthropathy resulting in moderate-severe spinal stenosis and severe bilateral foraminal stenosis. 2. At L3-4 there is a mild broad-based disc bulge. Severe bilateral facet arthropathy. Moderate spinal stenosis. Severe left foraminal stenosis. 3. At L4-5 there is a mild broad-based disc bulge. Severe bilateral facet arthropathy. Severe right and moderate  left foraminal stenosis. Electronically Signed   By: Kathreen Devoid   On: 03/07/2016 09:40   Ct Abd Wo & W Cm  04/02/2016  CLINICAL DATA:  Followup left renal cell carcinoma. Status post cryoablation. EXAM: CT ABDOMEN WITHOUT AND WITH CONTRAST TECHNIQUE: Multidetector CT imaging of the abdomen was performed following the standard protocol before and following the bolus administration of intravenous contrast. CONTRAST:  162m ISOVUE-300 IOPAMIDOL (ISOVUE-300) INJECTION 61% COMPARISON:  03/13/2015 FINDINGS: Lower chest:  No acute findings. Hepatobiliary: No liver masses identified. Mild hepatic steatosis again noted. Cholelithiasis again demonstrated, without evidence cholecystitis or biliary ductal dilatation. Pancreas: No mass, inflammatory changes, or other significant abnormality. Spleen: Within normal limits in size and appearance. Adrenals/Urinary  Tract: Normal adrenal glands. Multiple tiny bilateral intrarenal calculi again seen measuring up to 5 mm. No evidence of hydronephrosis. Tiny bilateral benign-appearing renal cysts show no significant change. Stable size and appearance of cryoablation defect involving the lower pole the left kidney. No evidence of locally recurrent mass. No other complex cystic or solid renal masses identified. Stomach/Bowel: No evidence of obstruction, inflammatory process, or abnormal fluid collections. Vascular/Lymphatic: No pathologically enlarged lymph nodes. No evidence of abdominal aortic aneurysm. Aortic atherosclerosis noted. Other: None. Musculoskeletal:  No suspicious bone lesions identified. IMPRESSION: Stable appearance of cryoablation defect involving the lower pole the left kidney. No evidence of locally recurrent carcinoma or metastatic disease within the abdomen. Stable bilateral nephrolithiasis and benign-appearing renal cysts. No evidence of hydronephrosis. Incidental findings including cholelithiasis and aortic atherosclerosis. Electronically Signed   By: JEarle GellM.D.   On: 04/02/2016 14:42    Labs:  CBC:  Recent Labs  05/01/15 0958 05/15/15 0531 05/16/15 0441  WBC 6.7 8.0 8.8  HGB 14.0 11.1* 11.2*  HCT 42.7 33.1* 32.4*  PLT 268 234 226    COAGS:  Recent Labs  05/01/15 0958  INR 0.99  APTT 29    BMP:  Recent Labs  05/01/15 0958 05/15/15 0531 05/16/15 0441 03/07/16 0911  NA 137 137 139  --   K 4.0 3.4* 3.6  --   CL 101 102 101  --   CO2 '28 28 30  '$ --   GLUCOSE 109* 118* 117*  --   BUN '18 13 12 20  '$ CALCIUM 8.9 7.7* 7.9*  --   CREATININE 1.12 1.02 0.99 1.19  GFRNONAA >60 >60 >60 60*  GFRAA >60 >60 >60 >60     Assessment and Plan:  I met with Mr. WBirkyand his wife today and reviewed the follow-up CT performed earlier today. This demonstrates a stable cryoablation defect at the lower pole of the left kidney with no evidence of recurrent enhancing tumor. Bilateral  renal calculi and benign-appearing cysts are stable. Renal function is normal and stable. I have recommended follow-up imaging in one year. If there is no evidence of carcinoma recurrence 5 years after ablation next year, we can discontinue routine follow-up imaging of the kidneys. Today, it is clearly evident that he has significant weakness of the left thigh musculature with associated atrophy. This may be on the basis of spinal stenosis at the L2-3 level. He is scheduled to see Dr. EEllene Routesoon for neurosurgical evaluation.   Electronically Signed:Aletta EdouardT 04/02/2016, 5:25 PM   I spent a total of 15 Minutes in face to face in clinical consultation, greater than 50% of which was counseling/coordinating care post ablation of a left renal carcinoma.

## 2016-04-23 DIAGNOSIS — M4316 Spondylolisthesis, lumbar region: Secondary | ICD-10-CM | POA: Diagnosis not present

## 2016-04-23 DIAGNOSIS — M4806 Spinal stenosis, lumbar region: Secondary | ICD-10-CM | POA: Diagnosis not present

## 2016-04-25 ENCOUNTER — Other Ambulatory Visit: Payer: Self-pay | Admitting: Neurological Surgery

## 2016-04-25 ENCOUNTER — Other Ambulatory Visit (HOSPITAL_COMMUNITY): Payer: Self-pay | Admitting: Neurological Surgery

## 2016-04-25 DIAGNOSIS — M48061 Spinal stenosis, lumbar region without neurogenic claudication: Secondary | ICD-10-CM

## 2016-05-20 ENCOUNTER — Ambulatory Visit (HOSPITAL_COMMUNITY)
Admission: RE | Admit: 2016-05-20 | Discharge: 2016-05-20 | Disposition: A | Discharge status: Home / Self Care | Payer: PPO | Source: Ambulatory Visit | Attending: Neurological Surgery | Admitting: Neurological Surgery

## 2016-05-20 DIAGNOSIS — M5116 Intervertebral disc disorders with radiculopathy, lumbar region: Secondary | ICD-10-CM | POA: Diagnosis not present

## 2016-05-20 DIAGNOSIS — Z981 Arthrodesis status: Secondary | ICD-10-CM | POA: Insufficient documentation

## 2016-05-20 DIAGNOSIS — M48061 Spinal stenosis, lumbar region without neurogenic claudication: Secondary | ICD-10-CM

## 2016-05-20 DIAGNOSIS — M4806 Spinal stenosis, lumbar region: Secondary | ICD-10-CM | POA: Insufficient documentation

## 2016-05-20 MED ORDER — DIAZEPAM 5 MG PO TABS
ORAL_TABLET | ORAL | Status: AC
  Administered 2016-05-20: 10 mg via ORAL
  Filled 2016-05-20: qty 2

## 2016-05-20 MED ORDER — DIAZEPAM 5 MG PO TABS
10.0000 mg | ORAL_TABLET | Freq: Once | ORAL | Status: AC
  Administered 2016-05-20: 10 mg via ORAL
  Filled 2016-05-20: qty 2

## 2016-05-20 MED ORDER — LIDOCAINE HCL (PF) 1 % IJ SOLN
10.0000 mL | Freq: Once | INTRAMUSCULAR | Status: AC
  Administered 2016-05-20: 5 mL via INTRADERMAL

## 2016-05-20 MED ORDER — IOPAMIDOL (ISOVUE-M 200) INJECTION 41%
20.0000 mL | Freq: Once | INTRAMUSCULAR | Status: AC
  Administered 2016-05-20: 12 mL via INTRATHECAL

## 2016-05-20 MED ORDER — LIDOCAINE HCL 1 % IJ SOLN
INTRAMUSCULAR | Status: AC
  Filled 2016-05-20: qty 10

## 2016-05-20 MED ORDER — IOPAMIDOL (ISOVUE-M 200) INJECTION 41%
INTRAMUSCULAR | Status: AC
  Administered 2016-05-20: 12 mL via INTRATHECAL
  Filled 2016-05-20: qty 10

## 2016-05-20 MED ORDER — OXYCODONE-ACETAMINOPHEN 5-325 MG PO TABS
1.0000 | ORAL_TABLET | ORAL | Status: DC | PRN
  Filled 2016-05-20: qty 2

## 2016-05-20 MED ORDER — ONDANSETRON HCL 4 MG/2ML IJ SOLN: 4.0000 mg | Freq: Four times a day (QID) | INTRAMUSCULAR | Status: DC | PRN

## 2016-05-20 NOTE — Discharge Instructions (Signed)
Myelogram and Lumbar Puncture Discharge Instructions ° °1. Go home and rest quietly for the next 24 hours.  It is important to lie flat for the next 24 hours.  Get up only to go to the restroom.  You may lie in the bed or on a couch on your back, your stomach, your left side or your right side.  You may have one pillow under your head.  You may have pillows between your knees while you are on your side or under your knees while you are on your back. ° °2. DO NOT drive today.  Recline the seat as far back as it will go, while still wearing your seat belt, on the way home. ° °3. You may get up to go to the bathroom as needed.  You may sit up for 10 minutes to eat.  You may resume your normal diet and medications unless otherwise indicated. ° °4. The incidence of headache, nausea, or vomiting is about 5% (one in 20 patients).  If you develop a headache, lie flat and drink plenty of fluids until the headache goes away.  Caffeinated beverages may be helpful.  If you develop severe nausea and vomiting or a headache that does not go away with flat bed rest, call Dr Elsner ° °5. You may resume normal activities after your 24 hours of bed rest is over; however, do not exert yourself strongly or do any heavy lifting tomorrow. ° °6. Call your physician for a follow-up appointment.  The results of your myelogram will be sent directly to your physician by the following day. ° °7. If you have any questions or if complications develop after you arrive home, please call Dr Elsner ° °Discharge instructions have been explained to the patient.  The patient, or the person responsible for the patient, fully understands these instructions. ° ° °

## 2016-05-20 NOTE — Procedures (Signed)
[name] is a 70 year old individual who has had significant problems with spondylosis in the lumbar spine with back and bilateral leg pain. An MRI was performed a while ago which demonstrated that he had adjacent level disease near previous fusion. His pain is very positional and it was advised that a myelogram and post myelogram CAT scan may better help evaluate the pain in preparation for potential surgery. He is now admitted for that procedure. His physical exam features signs of weakness in the proximal legs particularly in the quadriceps.  Pre op Dx: Lumbar spondylosis and stenosis with radiculopathy, status post lumbar fusion Post op Dx: Same Procedure: Lumbar myelogram Surgeon: Lachelle Rissler Puncture level: L2-3 Fluid color: Clear colorless Injection: Iohexol 180. 12 mL Findings: Moderate adjacent level spondylosis. Further evaluation with CT scanning.

## 2016-05-22 DIAGNOSIS — Z6834 Body mass index (BMI) 34.0-34.9, adult: Secondary | ICD-10-CM | POA: Diagnosis not present

## 2016-05-22 DIAGNOSIS — M4806 Spinal stenosis, lumbar region: Secondary | ICD-10-CM | POA: Diagnosis not present

## 2016-05-27 DIAGNOSIS — Z96642 Presence of left artificial hip joint: Secondary | ICD-10-CM | POA: Diagnosis not present

## 2016-05-27 DIAGNOSIS — Z96649 Presence of unspecified artificial hip joint: Secondary | ICD-10-CM | POA: Diagnosis not present

## 2016-06-18 DIAGNOSIS — Z6833 Body mass index (BMI) 33.0-33.9, adult: Secondary | ICD-10-CM | POA: Diagnosis not present

## 2016-06-18 DIAGNOSIS — M4806 Spinal stenosis, lumbar region: Secondary | ICD-10-CM | POA: Diagnosis not present

## 2016-06-19 ENCOUNTER — Other Ambulatory Visit: Payer: Self-pay | Admitting: Neurological Surgery

## 2016-06-30 DIAGNOSIS — M4806 Spinal stenosis, lumbar region: Secondary | ICD-10-CM | POA: Diagnosis not present

## 2016-06-30 DIAGNOSIS — M4816 Ankylosing hyperostosis [Forestier], lumbar region: Secondary | ICD-10-CM | POA: Diagnosis not present

## 2016-06-30 DIAGNOSIS — M4726 Other spondylosis with radiculopathy, lumbar region: Secondary | ICD-10-CM | POA: Diagnosis not present

## 2016-06-30 DIAGNOSIS — M5126 Other intervertebral disc displacement, lumbar region: Secondary | ICD-10-CM | POA: Diagnosis not present

## 2016-07-01 ENCOUNTER — Ambulatory Visit: Admit: 2016-07-01 | Payer: PPO | Admitting: Neurological Surgery

## 2016-07-01 SURGERY — LUMBAR LAMINECTOMY/ DECOMPRESSION WITH MET-RX
Anesthesia: General | Site: Back | Laterality: Left

## 2016-07-30 DIAGNOSIS — M545 Low back pain: Secondary | ICD-10-CM | POA: Diagnosis not present

## 2016-07-30 DIAGNOSIS — G8929 Other chronic pain: Secondary | ICD-10-CM | POA: Diagnosis not present

## 2016-08-01 DIAGNOSIS — M545 Low back pain: Secondary | ICD-10-CM | POA: Diagnosis not present

## 2016-08-01 DIAGNOSIS — G8929 Other chronic pain: Secondary | ICD-10-CM | POA: Diagnosis not present

## 2016-08-05 DIAGNOSIS — M545 Low back pain: Secondary | ICD-10-CM | POA: Diagnosis not present

## 2016-08-05 DIAGNOSIS — G8929 Other chronic pain: Secondary | ICD-10-CM | POA: Diagnosis not present

## 2016-08-08 DIAGNOSIS — G8929 Other chronic pain: Secondary | ICD-10-CM | POA: Diagnosis not present

## 2016-08-08 DIAGNOSIS — M545 Low back pain: Secondary | ICD-10-CM | POA: Diagnosis not present

## 2016-08-12 DIAGNOSIS — G8929 Other chronic pain: Secondary | ICD-10-CM | POA: Diagnosis not present

## 2016-08-12 DIAGNOSIS — M545 Low back pain: Secondary | ICD-10-CM | POA: Diagnosis not present

## 2016-08-14 DIAGNOSIS — M545 Low back pain: Secondary | ICD-10-CM | POA: Diagnosis not present

## 2016-08-14 DIAGNOSIS — G8929 Other chronic pain: Secondary | ICD-10-CM | POA: Diagnosis not present

## 2016-08-22 DIAGNOSIS — M545 Low back pain: Secondary | ICD-10-CM | POA: Diagnosis not present

## 2016-08-22 DIAGNOSIS — G8929 Other chronic pain: Secondary | ICD-10-CM | POA: Diagnosis not present

## 2016-09-03 ENCOUNTER — Other Ambulatory Visit: Payer: Self-pay | Admitting: Neurological Surgery

## 2016-09-03 DIAGNOSIS — M48062 Spinal stenosis, lumbar region with neurogenic claudication: Secondary | ICD-10-CM

## 2016-09-17 ENCOUNTER — Other Ambulatory Visit
Admission: RE | Admit: 2016-09-17 | Discharge: 2016-09-17 | Disposition: A | Discharge status: Home / Self Care | Payer: PPO | Source: Ambulatory Visit | Attending: Neurological Surgery | Admitting: Neurological Surgery

## 2016-09-17 ENCOUNTER — Ambulatory Visit
Admission: RE | Admit: 2016-09-17 | Discharge: 2016-09-17 | Disposition: A | Discharge status: Home / Self Care | Payer: PPO | Source: Ambulatory Visit | Attending: Neurological Surgery | Admitting: Neurological Surgery

## 2016-09-17 DIAGNOSIS — M48062 Spinal stenosis, lumbar region with neurogenic claudication: Secondary | ICD-10-CM | POA: Insufficient documentation

## 2016-09-17 DIAGNOSIS — M5126 Other intervertebral disc displacement, lumbar region: Secondary | ICD-10-CM | POA: Diagnosis not present

## 2016-09-17 DIAGNOSIS — M7138 Other bursal cyst, other site: Secondary | ICD-10-CM | POA: Diagnosis not present

## 2016-09-17 DIAGNOSIS — M2578 Osteophyte, vertebrae: Secondary | ICD-10-CM | POA: Insufficient documentation

## 2016-09-17 LAB — BUN: BUN: 20 mg/dL (ref 6–20)

## 2016-09-17 LAB — CREATININE, SERUM
CREATININE: 1.28 mg/dL — AB (ref 0.61–1.24)
GFR calc non Af Amer: 55 mL/min — ABNORMAL LOW (ref >60)

## 2016-09-17 MED ORDER — GADOBENATE DIMEGLUMINE 529 MG/ML IV SOLN
20.0000 mL | Freq: Once | INTRAVENOUS | Status: AC | PRN
  Administered 2016-09-17: 20 mL via INTRAVENOUS

## 2016-09-18 DIAGNOSIS — Z6834 Body mass index (BMI) 34.0-34.9, adult: Secondary | ICD-10-CM | POA: Diagnosis not present

## 2016-09-18 DIAGNOSIS — M48062 Spinal stenosis, lumbar region with neurogenic claudication: Secondary | ICD-10-CM | POA: Diagnosis not present

## 2016-09-18 DIAGNOSIS — I10 Essential (primary) hypertension: Secondary | ICD-10-CM | POA: Diagnosis not present

## 2016-09-22 ENCOUNTER — Other Ambulatory Visit: Payer: Self-pay | Admitting: Neurological Surgery

## 2016-09-23 DIAGNOSIS — H2513 Age-related nuclear cataract, bilateral: Secondary | ICD-10-CM | POA: Diagnosis not present

## 2016-10-07 DIAGNOSIS — Z79899 Other long term (current) drug therapy: Secondary | ICD-10-CM | POA: Diagnosis not present

## 2016-10-07 DIAGNOSIS — Z125 Encounter for screening for malignant neoplasm of prostate: Secondary | ICD-10-CM | POA: Diagnosis not present

## 2016-10-07 DIAGNOSIS — E784 Other hyperlipidemia: Secondary | ICD-10-CM | POA: Diagnosis not present

## 2016-10-13 DIAGNOSIS — Z0000 Encounter for general adult medical examination without abnormal findings: Secondary | ICD-10-CM | POA: Diagnosis not present

## 2016-10-13 DIAGNOSIS — Z01818 Encounter for other preprocedural examination: Secondary | ICD-10-CM | POA: Diagnosis not present

## 2016-10-29 NOTE — Pre-Procedure Instructions (Signed)
Dortha Schwalbe III  10/29/2016      [id number] - [place], [place] - [address] [place] [address] Phone: [phone] Fax: [phone]  [facility] #[id number] - [place], [place] [place] [address] Phone: [phone] Fax: [phone]  [facility] [place], [address] [place] [address] Phone: [phone] Fax: [phone]    Your procedure is scheduled on February 2  Report to [facility] Admitting at 0700 A.M.  Call this number if you have problems the morning of surgery:  [phone]   Remember:  Do not eat food or drink liquids after midnight.   Take these medicines the morning of surgery with A SIP OF WATER amLODipine (NORVASC), carbamazepine (TEGRETOL), traMADol (ULTRAM) if needed  7 days prior to surgery STOP taking any Aspirin, Aleve, Naproxen, Ibuprofen, Motrin, Advil, Goody's, BC's, all herbal medications, fish oil, and all vitamins    Do not wear jewelry.  Do not wear lotions, powders, or cologne, or deoderant.  Men may shave face and neck.  Do not bring valuables to the hospital.  [facility] is not responsible for any belongings or valuables.  Contacts, dentures or bridgework may not be worn into surgery.  Leave your suitcase in the car.  After surgery it may be brought to your room.  For patients admitted to the hospital, discharge time will be determined by your treatment team.  Patients discharged the day of surgery will not be allowed to drive home.    Special instructions:   [facility]- Preparing For Surgery  Before surgery, you can play an important role. Because skin is not sterile, your skin needs to be as free of germs as possible. You can reduce the number of germs on your skin by washing with CHG (chlorahexidine gluconate) Soap before surgery.  CHG is an antiseptic cleaner which kills germs and  bonds with the skin to continue killing germs even after washing.  Please do not use if you have an allergy to CHG or antibacterial soaps. If your skin becomes reddened/irritated stop using the CHG.  Do not shave (including legs and underarms) for at least 48 hours prior to first CHG shower. It is OK to shave your face.  Please follow these instructions carefully.   1. Shower the NIGHT BEFORE SURGERY and the MORNING OF SURGERY with CHG.   2. If you chose to wash your hair, wash your hair first as usual with your normal shampoo.  3. After you shampoo, rinse your hair and body thoroughly to remove the shampoo.  4. Use CHG as you would any other liquid soap. You can apply CHG directly to the skin and wash gently with a scrungie or a clean washcloth.   5. Apply the CHG Soap to your body ONLY FROM THE NECK DOWN.  Do not use on open wounds or open sores. Avoid contact with your eyes, ears, mouth and genitals (private parts). Wash genitals (private parts) with your normal soap.  6. Wash thoroughly, paying special attention to the area where your surgery will be performed.  7. Thoroughly rinse your body with warm water from the neck down.  8. DO NOT shower/wash with your normal soap after using and rinsing off the CHG Soap.  9. Pat yourself dry with a CLEAN TOWEL.  10. Wear CLEAN PAJAMAS   11. Place CLEAN SHEETS on your bed the night of your first shower and DO NOT SLEEP WITH PETS.    Day of Surgery: Do not apply any deodorants/lotions. Please wear clean clothes to the hospital/surgery center.      Please read over the following fact sheets that you were given.

## 2016-10-30 ENCOUNTER — Encounter (HOSPITAL_COMMUNITY)
Admission: RE | Admit: 2016-10-30 | Discharge: 2016-10-30 | Disposition: A | Discharge status: Home / Self Care | Payer: PPO | Source: Ambulatory Visit | Attending: Neurological Surgery | Admitting: Neurological Surgery

## 2016-10-30 ENCOUNTER — Encounter (HOSPITAL_COMMUNITY): Payer: Self-pay

## 2016-10-30 DIAGNOSIS — Z01812 Encounter for preprocedural laboratory examination: Secondary | ICD-10-CM | POA: Insufficient documentation

## 2016-10-30 HISTORY — DX: Depression, unspecified: F32.A

## 2016-10-30 HISTORY — DX: Personal history of urinary calculi: Z87.442

## 2016-10-30 HISTORY — DX: Major depressive disorder, single episode, unspecified: F32.9

## 2016-10-30 LAB — BASIC METABOLIC PANEL
Anion gap: 8 (ref 5–15)
BUN: 13 mg/dL (ref 6–20)
CALCIUM: 9.3 mg/dL (ref 8.9–10.3)
CHLORIDE: 105 mmol/L (ref 101–111)
CO2: 28 mmol/L (ref 22–32)
CREATININE: 1.19 mg/dL (ref 0.61–1.24)
GFR calc Af Amer: >60 mL/min (ref >60)
GFR calc non Af Amer: 60 mL/min — ABNORMAL LOW (ref >60)
GLUCOSE: 114 mg/dL — AB (ref 65–99)
Potassium: 4.3 mmol/L (ref 3.5–5.1)
Sodium: 141 mmol/L (ref 135–145)

## 2016-10-30 LAB — TYPE AND SCREEN: ANTIBODY SCREEN: NEGATIVE

## 2016-10-30 LAB — CBC
HEMATOCRIT: 44.8 % (ref 39.0–52.0)
HEMOGLOBIN: 14.7 g/dL (ref 13.0–17.0)
MCH: 30.1 pg (ref 26.0–34.0)
MCHC: 32.8 g/dL (ref 30.0–36.0)
MCV: 91.6 fL (ref 78.0–100.0)
Platelets: 223 10*3/uL (ref 150–400)
RBC: 4.89 MIL/uL (ref 4.22–5.81)
RDW: 13.3 % (ref 11.5–15.5)
WBC: 6.4 10*3/uL (ref 4.0–10.5)

## 2016-10-30 LAB — SURGICAL PCR SCREEN
MRSA, PCR: NEGATIVE
STAPHYLOCOCCUS AUREUS: NEGATIVE

## 2016-11-06 NOTE — Anesthesia Preprocedure Evaluation (Addendum)
Anesthesia Evaluation  Patient identified by MRN, date of birth, ID band Patient awake    Reviewed: Allergy & Precautions, NPO status , Patient's Chart, lab work & pertinent test results  Airway Mallampati: II  TM Distance: >3 FB Neck ROM: Full    Dental  (+) Dental Advisory Given   Pulmonary neg pulmonary ROS,    breath sounds clear to auscultation       Cardiovascular hypertension, Pt. on medications  Rhythm:Regular Rate:Normal     Neuro/Psych negative neurological ROS     GI/Hepatic negative GI ROS, Neg liver ROS,   Endo/Other  negative endocrine ROS  Renal/GU Renal disease     Musculoskeletal  (+) Arthritis ,   Abdominal   Peds  Hematology negative hematology ROS (+)   Anesthesia Other Findings   Reproductive/Obstetrics                            Lab Results  Component Value Date   WBC 6.4 10/30/2016   HGB 14.7 10/30/2016   HCT 44.8 10/30/2016   MCV 91.6 10/30/2016   PLT 223 10/30/2016   Lab Results  Component Value Date   CREATININE 1.19 10/30/2016   BUN 13 10/30/2016   NA 141 10/30/2016   K 4.3 10/30/2016   CL 105 10/30/2016   CO2 28 10/30/2016   Lab Results  Component Value Date   INR 0.99 05/01/2015   INR 1.0 06/26/2014   INR 1.04 02/09/2014    Anesthesia Physical Anesthesia Plan  ASA: II  Anesthesia Plan: General   Post-op Pain Management:    Induction: Intravenous  Airway Management Planned: Oral ETT  Additional Equipment:   Intra-op Plan:   Post-operative Plan: Extubation in OR  Informed Consent: I have reviewed the patients History and Physical, chart, labs and discussed the procedure including the risks, benefits and alternatives for the proposed anesthesia with the patient or authorized representative who has indicated his/her understanding and acceptance.   Dental advisory given  Plan Discussed with:   Anesthesia Plan Comments:         Anesthesia Quick Evaluation

## 2016-11-07 ENCOUNTER — Inpatient Hospital Stay (HOSPITAL_COMMUNITY): Payer: PPO | Admitting: Anesthesiology

## 2016-11-07 ENCOUNTER — Encounter (HOSPITAL_COMMUNITY): Payer: Self-pay | Admitting: *Deleted

## 2016-11-07 ENCOUNTER — Encounter (HOSPITAL_COMMUNITY)
Admission: RE | Disposition: A | Discharge status: Skilled Nursing Facility | Payer: Self-pay | Source: Ambulatory Visit | Attending: Neurological Surgery

## 2016-11-07 ENCOUNTER — Inpatient Hospital Stay (HOSPITAL_COMMUNITY): Payer: PPO

## 2016-11-07 ENCOUNTER — Inpatient Hospital Stay (HOSPITAL_COMMUNITY)
Admission: RE | Admit: 2016-11-07 | Discharge: 2016-11-12 | DRG: 454 | Disposition: A | Discharge status: Skilled Nursing Facility | Payer: PPO | Source: Ambulatory Visit | Attending: Neurological Surgery | Admitting: Neurological Surgery

## 2016-11-07 DIAGNOSIS — M6281 Muscle weakness (generalized): Secondary | ICD-10-CM | POA: Diagnosis not present

## 2016-11-07 DIAGNOSIS — L89819 Pressure ulcer of head, unspecified stage: Secondary | ICD-10-CM | POA: Diagnosis not present

## 2016-11-07 DIAGNOSIS — N200 Calculus of kidney: Secondary | ICD-10-CM | POA: Diagnosis not present

## 2016-11-07 DIAGNOSIS — F419 Anxiety disorder, unspecified: Secondary | ICD-10-CM | POA: Diagnosis present

## 2016-11-07 DIAGNOSIS — D62 Acute posthemorrhagic anemia: Secondary | ICD-10-CM | POA: Diagnosis not present

## 2016-11-07 DIAGNOSIS — Z79899 Other long term (current) drug therapy: Secondary | ICD-10-CM

## 2016-11-07 DIAGNOSIS — E7800 Pure hypercholesterolemia, unspecified: Secondary | ICD-10-CM | POA: Diagnosis not present

## 2016-11-07 DIAGNOSIS — Z96643 Presence of artificial hip joint, bilateral: Secondary | ICD-10-CM | POA: Diagnosis not present

## 2016-11-07 DIAGNOSIS — Z419 Encounter for procedure for purposes other than remedying health state, unspecified: Secondary | ICD-10-CM

## 2016-11-07 DIAGNOSIS — R569 Unspecified convulsions: Secondary | ICD-10-CM | POA: Diagnosis not present

## 2016-11-07 DIAGNOSIS — F329 Major depressive disorder, single episode, unspecified: Secondary | ICD-10-CM | POA: Diagnosis present

## 2016-11-07 DIAGNOSIS — Z5189 Encounter for other specified aftercare: Secondary | ICD-10-CM | POA: Diagnosis not present

## 2016-11-07 DIAGNOSIS — M5416 Radiculopathy, lumbar region: Secondary | ICD-10-CM | POA: Diagnosis present

## 2016-11-07 DIAGNOSIS — L8990 Pressure ulcer of unspecified site, unspecified stage: Secondary | ICD-10-CM | POA: Insufficient documentation

## 2016-11-07 DIAGNOSIS — M62838 Other muscle spasm: Secondary | ICD-10-CM | POA: Diagnosis not present

## 2016-11-07 DIAGNOSIS — F5101 Primary insomnia: Secondary | ICD-10-CM | POA: Diagnosis not present

## 2016-11-07 DIAGNOSIS — I10 Essential (primary) hypertension: Secondary | ICD-10-CM | POA: Diagnosis present

## 2016-11-07 DIAGNOSIS — M4726 Other spondylosis with radiculopathy, lumbar region: Principal | ICD-10-CM | POA: Diagnosis present

## 2016-11-07 DIAGNOSIS — M545 Low back pain: Secondary | ICD-10-CM | POA: Diagnosis not present

## 2016-11-07 DIAGNOSIS — R531 Weakness: Secondary | ICD-10-CM | POA: Diagnosis not present

## 2016-11-07 DIAGNOSIS — M549 Dorsalgia, unspecified: Secondary | ICD-10-CM | POA: Diagnosis not present

## 2016-11-07 DIAGNOSIS — M48061 Spinal stenosis, lumbar region without neurogenic claudication: Secondary | ICD-10-CM | POA: Diagnosis present

## 2016-11-07 DIAGNOSIS — M48062 Spinal stenosis, lumbar region with neurogenic claudication: Secondary | ICD-10-CM | POA: Diagnosis not present

## 2016-11-07 DIAGNOSIS — Z981 Arthrodesis status: Secondary | ICD-10-CM | POA: Diagnosis not present

## 2016-11-07 LAB — POCT I-STAT 4, (NA,K, GLUC, HGB,HCT)
Glucose, Bld: 100 mg/dL — ABNORMAL HIGH (ref 65–99)
Glucose, Bld: 106 mg/dL — ABNORMAL HIGH (ref 65–99)
HCT: 29 % — ABNORMAL LOW (ref 39.0–52.0)
HEMATOCRIT: 29 % — AB (ref 39.0–52.0)
HEMOGLOBIN: 9.9 g/dL — AB (ref 13.0–17.0)
Hemoglobin: 9.9 g/dL — ABNORMAL LOW (ref 13.0–17.0)
POTASSIUM: 4 mmol/L (ref 3.5–5.1)
Potassium: 4 mmol/L (ref 3.5–5.1)
SODIUM: 140 mmol/L (ref 135–145)
Sodium: 139 mmol/L (ref 135–145)

## 2016-11-07 LAB — TYPE AND SCREEN
ABO/RH(D): O POS
Antibody Screen: NEGATIVE

## 2016-11-07 LAB — ABO/RH: ABO/RH(D): O POS

## 2016-11-07 SURGERY — POSTERIOR LUMBAR FUSION 3 LEVEL
Anesthesia: General | Site: Spine Lumbar

## 2016-11-07 MED ORDER — LIDOCAINE-EPINEPHRINE (PF) 2 %-1:200000 IJ SOLN
INTRAMUSCULAR | Status: AC
  Filled 2016-11-07: qty 20

## 2016-11-07 MED ORDER — SUFENTANIL CITRATE 50 MCG/ML IV SOLN
INTRAVENOUS | Status: DC | PRN
  Administered 2016-11-07: 10 ug via INTRAVENOUS
  Administered 2016-11-07: 20 ug via INTRAVENOUS
  Administered 2016-11-07: 10 ug via INTRAVENOUS
  Administered 2016-11-07 (×2): 5 ug via INTRAVENOUS
  Administered 2016-11-07: 10 ug via INTRAVENOUS
  Administered 2016-11-07 (×2): 5 ug via INTRAVENOUS
  Administered 2016-11-07: 20 ug via INTRAVENOUS
  Administered 2016-11-07 (×2): 5 ug via INTRAVENOUS

## 2016-11-07 MED ORDER — ROCURONIUM BROMIDE 100 MG/10ML IV SOLN
INTRAVENOUS | Status: DC | PRN
  Administered 2016-11-07 (×2): 10 mg via INTRAVENOUS
  Administered 2016-11-07: 50 mg via INTRAVENOUS
  Administered 2016-11-07: 20 mg via INTRAVENOUS
  Administered 2016-11-07: 10 mg via INTRAVENOUS

## 2016-11-07 MED ORDER — BUPIVACAINE HCL (PF) 0.5 % IJ SOLN
INTRAMUSCULAR | Status: AC
  Filled 2016-11-07: qty 30

## 2016-11-07 MED ORDER — CARBAMAZEPINE 200 MG PO TABS
400.0000 mg | ORAL_TABLET | Freq: Two times a day (BID) | ORAL | Status: DC
  Administered 2016-11-07 – 2016-11-12 (×10): 400 mg via ORAL
  Filled 2016-11-07 (×10): qty 2

## 2016-11-07 MED ORDER — ACETAMINOPHEN 325 MG PO TABS: 650.0000 mg | ORAL_TABLET | ORAL | Status: DC | PRN

## 2016-11-07 MED ORDER — OXYCODONE-ACETAMINOPHEN 5-325 MG PO TABS
1.0000 | ORAL_TABLET | ORAL | Status: DC | PRN
  Administered 2016-11-07 – 2016-11-11 (×13): 2 via ORAL
  Filled 2016-11-07 (×13): qty 2

## 2016-11-07 MED ORDER — DOCUSATE SODIUM 100 MG PO CAPS
100.0000 mg | ORAL_CAPSULE | Freq: Two times a day (BID) | ORAL | Status: DC
  Administered 2016-11-07 – 2016-11-12 (×10): 100 mg via ORAL
  Filled 2016-11-07 (×10): qty 1

## 2016-11-07 MED ORDER — SUCCINYLCHOLINE CHLORIDE 20 MG/ML IJ SOLN
INTRAMUSCULAR | Status: DC | PRN
  Administered 2016-11-07: 120 mg via INTRAVENOUS

## 2016-11-07 MED ORDER — SODIUM CHLORIDE 0.9% FLUSH
3.0000 mL | Freq: Two times a day (BID) | INTRAVENOUS | Status: DC
  Administered 2016-11-07 – 2016-11-11 (×7): 3 mL via INTRAVENOUS

## 2016-11-07 MED ORDER — MENTHOL 3 MG MT LOZG
1.0000 | LOZENGE | OROMUCOSAL | Status: DC | PRN
Start: 2016-11-07 — End: 2016-11-12

## 2016-11-07 MED ORDER — HYDROCHLOROTHIAZIDE 25 MG PO TABS
25.0000 mg | ORAL_TABLET | Freq: Every day | ORAL | Status: DC
  Administered 2016-11-08 – 2016-11-12 (×5): 25 mg via ORAL
  Filled 2016-11-07 (×5): qty 1

## 2016-11-07 MED ORDER — DEXAMETHASONE SODIUM PHOSPHATE 10 MG/ML IJ SOLN
INTRAMUSCULAR | Status: AC
  Filled 2016-11-07: qty 1

## 2016-11-07 MED ORDER — ONDANSETRON HCL 4 MG/2ML IJ SOLN
INTRAMUSCULAR | Status: AC
  Filled 2016-11-07: qty 2

## 2016-11-07 MED ORDER — SUFENTANIL CITRATE 50 MCG/ML IV SOLN
INTRAVENOUS | Status: AC
  Filled 2016-11-07: qty 1

## 2016-11-07 MED ORDER — PHENOL 1.4 % MT LIQD
1.0000 | OROMUCOSAL | Status: DC | PRN
Start: 2016-11-07 — End: 2016-11-12

## 2016-11-07 MED ORDER — SUGAMMADEX SODIUM 200 MG/2ML IV SOLN
INTRAVENOUS | Status: AC
  Filled 2016-11-07: qty 2

## 2016-11-07 MED ORDER — AMLODIPINE BESYLATE 10 MG PO TABS
10.0000 mg | ORAL_TABLET | Freq: Every day | ORAL | Status: DC
  Administered 2016-11-08 – 2016-11-12 (×5): 10 mg via ORAL
  Filled 2016-11-07 (×5): qty 1

## 2016-11-07 MED ORDER — SUGAMMADEX SODIUM 200 MG/2ML IV SOLN
INTRAVENOUS | Status: DC | PRN
  Administered 2016-11-07: 200 mg via INTRAVENOUS

## 2016-11-07 MED ORDER — CEFAZOLIN SODIUM 1 G IJ SOLR
INTRAMUSCULAR | Status: AC
  Filled 2016-11-07: qty 20

## 2016-11-07 MED ORDER — ONDANSETRON HCL 4 MG/2ML IJ SOLN
INTRAMUSCULAR | Status: DC | PRN
  Administered 2016-11-07: 4 mg via INTRAVENOUS

## 2016-11-07 MED ORDER — SODIUM CHLORIDE 0.9 % IV SOLN
INTRAVENOUS | Status: DC | PRN
Start: 2016-11-07 — End: 2016-11-07
  Administered 2016-11-07: 17:00:00 via INTRAVENOUS

## 2016-11-07 MED ORDER — SODIUM CHLORIDE 0.9 % IJ SOLN
INTRAMUSCULAR | Status: AC
  Filled 2016-11-07: qty 10

## 2016-11-07 MED ORDER — POLYETHYLENE GLYCOL 3350 17 G PO PACK
17.0000 g | PACK | Freq: Every day | ORAL | Status: DC | PRN
  Administered 2016-11-09 – 2016-11-10 (×2): 17 g via ORAL
  Filled 2016-11-07 (×2): qty 1

## 2016-11-07 MED ORDER — DEXAMETHASONE 2 MG PO TABS
2.0000 mg | ORAL_TABLET | Freq: Two times a day (BID) | ORAL | Status: DC
  Administered 2016-11-08 – 2016-11-12 (×8): 2 mg via ORAL
  Filled 2016-11-07 (×10): qty 1

## 2016-11-07 MED ORDER — MORPHINE SULFATE (PF) 2 MG/ML IV SOLN: 1.0000 mg | INTRAVENOUS | Status: DC | PRN

## 2016-11-07 MED ORDER — THROMBIN 5000 UNITS EX SOLR
CUTANEOUS | Status: AC
  Filled 2016-11-07: qty 5000

## 2016-11-07 MED ORDER — 0.9 % SODIUM CHLORIDE (POUR BTL) OPTIME
TOPICAL | Status: DC | PRN
  Administered 2016-11-07: 1000 mL

## 2016-11-07 MED ORDER — ALUM & MAG HYDROXIDE-SIMETH 200-200-20 MG/5ML PO SUSP: 30.0000 mL | Freq: Four times a day (QID) | ORAL | Status: DC | PRN

## 2016-11-07 MED ORDER — LIDOCAINE HCL (CARDIAC) 20 MG/ML IV SOLN
INTRAVENOUS | Status: DC | PRN
  Administered 2016-11-07: 100 mg via INTRAVENOUS

## 2016-11-07 MED ORDER — ALBUMIN HUMAN 5 % IV SOLN
INTRAVENOUS | Status: DC | PRN
  Administered 2016-11-07 (×3): via INTRAVENOUS

## 2016-11-07 MED ORDER — ONDANSETRON HCL 4 MG/2ML IJ SOLN
4.0000 mg | INTRAMUSCULAR | Status: DC | PRN
  Administered 2016-11-07 – 2016-11-09 (×4): 4 mg via INTRAVENOUS
  Filled 2016-11-07 (×3): qty 2

## 2016-11-07 MED ORDER — BISACODYL 10 MG RE SUPP
10.0000 mg | Freq: Every day | RECTAL | Status: DC | PRN
  Administered 2016-11-11: 10 mg via RECTAL
  Filled 2016-11-07: qty 1

## 2016-11-07 MED ORDER — HYDROMORPHONE HCL 1 MG/ML IJ SOLN: 0.2500 mg | INTRAMUSCULAR | Status: DC | PRN

## 2016-11-07 MED ORDER — LIDOCAINE-EPINEPHRINE (PF) 2 %-1:200000 IJ SOLN
INTRAMUSCULAR | Status: DC | PRN
  Administered 2016-11-07: 5 mL

## 2016-11-07 MED ORDER — MIDAZOLAM HCL 5 MG/5ML IJ SOLN
INTRAMUSCULAR | Status: DC | PRN
  Administered 2016-11-07: 2 mg via INTRAVENOUS

## 2016-11-07 MED ORDER — FENTANYL CITRATE (PF) 100 MCG/2ML IJ SOLN
INTRAMUSCULAR | Status: AC
  Filled 2016-11-07: qty 2

## 2016-11-07 MED ORDER — FENTANYL CITRATE (PF) 100 MCG/2ML IJ SOLN
INTRAMUSCULAR | Status: DC | PRN
  Administered 2016-11-07 (×2): 50 ug via INTRAVENOUS

## 2016-11-07 MED ORDER — PROPOFOL 10 MG/ML IV BOLUS
INTRAVENOUS | Status: AC
  Filled 2016-11-07: qty 20

## 2016-11-07 MED ORDER — PHENYLEPHRINE HCL 10 MG/ML IJ SOLN
INTRAVENOUS | Status: DC | PRN
  Administered 2016-11-07: 10 ug/min via INTRAVENOUS

## 2016-11-07 MED ORDER — MIDAZOLAM HCL 2 MG/2ML IJ SOLN
INTRAMUSCULAR | Status: AC
  Filled 2016-11-07: qty 2

## 2016-11-07 MED ORDER — METHOCARBAMOL 1000 MG/10ML IJ SOLN
500.0000 mg | Freq: Four times a day (QID) | INTRAVENOUS | Status: DC | PRN
  Filled 2016-11-07: qty 5

## 2016-11-07 MED ORDER — CEFAZOLIN SODIUM-DEXTROSE 2-4 GM/100ML-% IV SOLN
2.0000 g | INTRAVENOUS | Status: AC
  Administered 2016-11-07 (×2): 2 g via INTRAVENOUS
  Filled 2016-11-07: qty 100

## 2016-11-07 MED ORDER — CHLORHEXIDINE GLUCONATE CLOTH 2 % EX PADS: 6.0000 | MEDICATED_PAD | Freq: Once | CUTANEOUS | Status: DC

## 2016-11-07 MED ORDER — DEXAMETHASONE SODIUM PHOSPHATE 10 MG/ML IJ SOLN
INTRAMUSCULAR | Status: DC | PRN
  Administered 2016-11-07: 10 mg via INTRAVENOUS

## 2016-11-07 MED ORDER — SODIUM CHLORIDE 0.9 % IV SOLN
INTRAVENOUS | Status: DC
  Administered 2016-11-07 – 2016-11-09 (×4): via INTRAVENOUS

## 2016-11-07 MED ORDER — ARTIFICIAL TEARS OP OINT
TOPICAL_OINTMENT | OPHTHALMIC | Status: AC
  Filled 2016-11-07: qty 3.5

## 2016-11-07 MED ORDER — PROMETHAZINE HCL 25 MG/ML IJ SOLN: 6.2500 mg | INTRAMUSCULAR | Status: DC | PRN

## 2016-11-07 MED ORDER — THROMBIN 20000 UNITS EX SOLR
CUTANEOUS | Status: DC | PRN
  Administered 2016-11-07: 20 mL via TOPICAL

## 2016-11-07 MED ORDER — EPHEDRINE 5 MG/ML INJ
INTRAVENOUS | Status: AC
  Filled 2016-11-07: qty 10

## 2016-11-07 MED ORDER — LIDOCAINE 2% (20 MG/ML) 5 ML SYRINGE
INTRAMUSCULAR | Status: AC
  Filled 2016-11-07: qty 5

## 2016-11-07 MED ORDER — PHENYLEPHRINE 40 MCG/ML (10ML) SYRINGE FOR IV PUSH (FOR BLOOD PRESSURE SUPPORT)
PREFILLED_SYRINGE | INTRAVENOUS | Status: AC
  Filled 2016-11-07: qty 10

## 2016-11-07 MED ORDER — BACITRACIN 50000 UNITS IM SOLR
INTRAMUSCULAR | Status: DC | PRN
  Administered 2016-11-07: 500 mL

## 2016-11-07 MED ORDER — BUPIVACAINE HCL (PF) 0.5 % IJ SOLN
INTRAMUSCULAR | Status: DC | PRN
  Administered 2016-11-07: 5 mL
  Administered 2016-11-07: 20 mL

## 2016-11-07 MED ORDER — DEXAMETHASONE SODIUM PHOSPHATE 4 MG/ML IJ SOLN
2.0000 mg | Freq: Two times a day (BID) | INTRAMUSCULAR | Status: DC
  Administered 2016-11-07 – 2016-11-08 (×2): 2 mg via INTRAVENOUS
  Filled 2016-11-07 (×3): qty 1

## 2016-11-07 MED ORDER — SENNA 8.6 MG PO TABS
1.0000 | ORAL_TABLET | Freq: Two times a day (BID) | ORAL | Status: DC
Start: 2016-11-07 — End: 2016-11-12
  Administered 2016-11-07 – 2016-11-12 (×10): 8.6 mg via ORAL
  Filled 2016-11-07 (×10): qty 1

## 2016-11-07 MED ORDER — ARTIFICIAL TEARS OP OINT
TOPICAL_OINTMENT | OPHTHALMIC | Status: DC | PRN
  Administered 2016-11-07: 1 via OPHTHALMIC

## 2016-11-07 MED ORDER — METHOCARBAMOL 500 MG PO TABS
500.0000 mg | ORAL_TABLET | Freq: Four times a day (QID) | ORAL | Status: DC | PRN
  Administered 2016-11-08 – 2016-11-11 (×10): 500 mg via ORAL
  Filled 2016-11-07 (×10): qty 1

## 2016-11-07 MED ORDER — PROPOFOL 10 MG/ML IV BOLUS
INTRAVENOUS | Status: DC | PRN
  Administered 2016-11-07: 160 mg via INTRAVENOUS

## 2016-11-07 MED ORDER — SODIUM CHLORIDE 0.9% FLUSH: 3.0000 mL | INTRAVENOUS | Status: DC | PRN

## 2016-11-07 MED ORDER — CEFAZOLIN SODIUM-DEXTROSE 2-4 GM/100ML-% IV SOLN
2.0000 g | Freq: Three times a day (TID) | INTRAVENOUS | Status: AC
  Administered 2016-11-08 (×2): 2 g via INTRAVENOUS
  Filled 2016-11-07 (×2): qty 100

## 2016-11-07 MED ORDER — THROMBIN 5000 UNITS EX SOLR
OROMUCOSAL | Status: DC | PRN
  Administered 2016-11-07 (×2): 5 mL via TOPICAL

## 2016-11-07 MED ORDER — THROMBIN 20000 UNITS EX SOLR
CUTANEOUS | Status: AC
  Filled 2016-11-07: qty 20000

## 2016-11-07 MED ORDER — FLEET ENEMA 7-19 GM/118ML RE ENEM: 1.0000 | ENEMA | Freq: Once | RECTAL | Status: DC | PRN

## 2016-11-07 MED ORDER — SODIUM CHLORIDE 0.9 % IV SOLN: 250.0000 mL | INTRAVENOUS | Status: DC

## 2016-11-07 MED ORDER — TRAMADOL HCL 50 MG PO TABS
50.0000 mg | ORAL_TABLET | ORAL | Status: DC | PRN
  Administered 2016-11-09 – 2016-11-12 (×7): 100 mg via ORAL
  Filled 2016-11-07 (×8): qty 2

## 2016-11-07 MED ORDER — SUCCINYLCHOLINE CHLORIDE 200 MG/10ML IV SOSY
PREFILLED_SYRINGE | INTRAVENOUS | Status: AC
  Filled 2016-11-07: qty 10

## 2016-11-07 MED ORDER — ACETAMINOPHEN 650 MG RE SUPP: 650.0000 mg | RECTAL | Status: DC | PRN

## 2016-11-07 MED ORDER — HYDROCODONE-ACETAMINOPHEN 5-325 MG PO TABS
1.0000 | ORAL_TABLET | ORAL | Status: DC | PRN
  Administered 2016-11-08 – 2016-11-10 (×3): 2 via ORAL
  Filled 2016-11-07 (×4): qty 2

## 2016-11-07 MED ORDER — ROCURONIUM BROMIDE 50 MG/5ML IV SOSY
PREFILLED_SYRINGE | INTRAVENOUS | Status: AC
  Filled 2016-11-07: qty 5

## 2016-11-07 MED ORDER — LACTATED RINGERS IV SOLN
INTRAVENOUS | Status: DC | PRN
  Administered 2016-11-07 (×4): via INTRAVENOUS

## 2016-11-07 SURGICAL SUPPLY — 79 items
BAG DECANTER FOR FLEXI CONT (MISCELLANEOUS) ×2 IMPLANT
BASKET BONE COLLECTION (BASKET) ×2 IMPLANT
BLADE CLIPPER SURG (BLADE) ×2 IMPLANT
BLADE OSCILLATING /SAGITTAL (BLADE) ×2 IMPLANT
BUR MATCHSTICK NEURO 3.0 LAGG (BURR) ×4 IMPLANT
CAGE COROENT LRG 10X9X28-12 (Cage) ×4 IMPLANT
CAGE COROENT LRG 9X9X28-8 (Cage) ×4 IMPLANT
CAGE PLIF MAS 9X8X28-4 LUMBAR (Cage) ×2 IMPLANT
CANISTER SUCT 3000ML PPV (MISCELLANEOUS) ×2 IMPLANT
CARTRIDGE OIL MAESTRO DRILL (MISCELLANEOUS) IMPLANT
CONT SPEC 4OZ CLIKSEAL STRL BL (MISCELLANEOUS) ×2 IMPLANT
COVER BACK TABLE 60X90IN (DRAPES) ×2 IMPLANT
DECANTER SPIKE VIAL GLASS SM (MISCELLANEOUS) ×4 IMPLANT
DERMABOND ADVANCED (GAUZE/BANDAGES/DRESSINGS) ×1
DERMABOND ADVANCED .7 DNX12 (GAUZE/BANDAGES/DRESSINGS) ×1 IMPLANT
DEVICE DISSECT PLASMABLAD 3.0S (MISCELLANEOUS) ×1 IMPLANT
DIFFUSER DRILL AIR PNEUMATIC (MISCELLANEOUS) IMPLANT
DRAPE C-ARM 42X72 X-RAY (DRAPES) ×4 IMPLANT
DRAPE HALF SHEET 40X57 (DRAPES) IMPLANT
DRAPE INCISE 23X17 IOBAN STRL (DRAPES) ×1
DRAPE INCISE IOBAN 23X17 STRL (DRAPES) ×1 IMPLANT
DRAPE LAPAROTOMY 100X72X124 (DRAPES) ×2 IMPLANT
DRAPE POUCH INSTRU U-SHP 10X18 (DRAPES) ×2 IMPLANT
DRSG OPSITE POSTOP 4X8 (GAUZE/BANDAGES/DRESSINGS) ×2 IMPLANT
DURAPREP 26ML APPLICATOR (WOUND CARE) ×2 IMPLANT
DURASEAL APPLICATOR TIP (TIP) IMPLANT
DURASEAL SPINE SEALANT 3ML (MISCELLANEOUS) IMPLANT
ELECT REM PT RETURN 9FT ADLT (ELECTROSURGICAL) ×2
ELECTRODE REM PT RTRN 9FT ADLT (ELECTROSURGICAL) ×1 IMPLANT
GAUZE SPONGE 4X4 12PLY STRL (GAUZE/BANDAGES/DRESSINGS) IMPLANT
GAUZE SPONGE 4X4 16PLY XRAY LF (GAUZE/BANDAGES/DRESSINGS) IMPLANT
GLOVE BIOGEL PI IND STRL 6.5 (GLOVE) ×3 IMPLANT
GLOVE BIOGEL PI IND STRL 7.0 (GLOVE) ×2 IMPLANT
GLOVE BIOGEL PI IND STRL 7.5 (GLOVE) ×3 IMPLANT
GLOVE BIOGEL PI IND STRL 8.5 (GLOVE) ×2 IMPLANT
GLOVE BIOGEL PI INDICATOR 6.5 (GLOVE) ×3
GLOVE BIOGEL PI INDICATOR 7.0 (GLOVE) ×2
GLOVE BIOGEL PI INDICATOR 7.5 (GLOVE) ×3
GLOVE BIOGEL PI INDICATOR 8.5 (GLOVE) ×2
GLOVE ECLIPSE 8.5 STRL (GLOVE) ×4 IMPLANT
GLOVE SS N UNI LF 7.0 STRL (GLOVE) ×4 IMPLANT
GLOVE SURG SS PI 6.5 STRL IVOR (GLOVE) ×4 IMPLANT
GLOVE SURG SS PI 7.0 STRL IVOR (GLOVE) ×4 IMPLANT
GOWN STRL REUS W/ TWL LRG LVL3 (GOWN DISPOSABLE) ×3 IMPLANT
GOWN STRL REUS W/ TWL XL LVL3 (GOWN DISPOSABLE) ×1 IMPLANT
GOWN STRL REUS W/TWL 2XL LVL3 (GOWN DISPOSABLE) ×6 IMPLANT
GOWN STRL REUS W/TWL LRG LVL3 (GOWN DISPOSABLE) ×5 IMPLANT
GOWN STRL REUS W/TWL XL LVL3 (GOWN DISPOSABLE) ×1
HEMOSTAT POWDER KIT SURGIFOAM (HEMOSTASIS) ×4 IMPLANT
KIT BASIN OR (CUSTOM PROCEDURE TRAY) ×2 IMPLANT
KIT ROOM TURNOVER OR (KITS) ×2 IMPLANT
MILL MEDIUM DISP (BLADE) ×4 IMPLANT
MODULE POWER NUVASIVE (MISCELLANEOUS) ×1 IMPLANT
NEEDLE HYPO 22GX1.5 SAFETY (NEEDLE) ×2 IMPLANT
NS IRRIG 1000ML POUR BTL (IV SOLUTION) ×2 IMPLANT
OIL CARTRIDGE MAESTRO DRILL (MISCELLANEOUS)
PACK LAMINECTOMY NEURO (CUSTOM PROCEDURE TRAY) ×2 IMPLANT
PAD ARMBOARD 7.5X6 YLW CONV (MISCELLANEOUS) ×10 IMPLANT
PATTIES SURGICAL .5 X.5 (GAUZE/BANDAGES/DRESSINGS) ×2 IMPLANT
PATTIES SURGICAL .5 X1 (DISPOSABLE) ×2 IMPLANT
PATTIES SURGICAL 1X1 (DISPOSABLE) ×2 IMPLANT
PLASMABLADE 3.0S (MISCELLANEOUS) ×2
POWER MODULE NUVASIVE (MISCELLANEOUS) ×2
ROD RELINE-O 5.5X100 LORD (Rod) ×4 IMPLANT
SCREW LOCK RELINE 5.5 TULIP (Screw) ×16 IMPLANT
SCREW RELINE-O POLY 7.5X50 (Screw) ×8 IMPLANT
SCREW RLINE PLY 2S 50X7.5XPA (Screw) ×8 IMPLANT
SPONGE LAP 4X18 X RAY DECT (DISPOSABLE) ×4 IMPLANT
SPONGE SURGIFOAM ABS GEL 100 (HEMOSTASIS) ×2 IMPLANT
SUT PROLENE 6 0 BV (SUTURE) IMPLANT
SUT VIC AB 1 CT1 18XBRD ANBCTR (SUTURE) ×2 IMPLANT
SUT VIC AB 1 CT1 8-18 (SUTURE) ×2
SUT VIC AB 2-0 CP2 18 (SUTURE) ×4 IMPLANT
SUT VIC AB 3-0 SH 8-18 (SUTURE) ×4 IMPLANT
SYR 3ML LL SCALE MARK (SYRINGE) ×8 IMPLANT
TOWEL OR 17X24 6PK STRL BLUE (TOWEL DISPOSABLE) ×2 IMPLANT
TOWEL OR 17X26 10 PK STRL BLUE (TOWEL DISPOSABLE) ×2 IMPLANT
TRAY FOLEY W/METER SILVER 16FR (SET/KITS/TRAYS/PACK) ×2 IMPLANT
WATER STERILE IRR 1000ML POUR (IV SOLUTION) ×2 IMPLANT

## 2016-11-07 NOTE — H&P (Addendum)
Bryan Frey is an 71 y.o. male.   Chief Complaint: Left lower extremity pain and weakness with spasms HPI: [name] is a 71 year old individual who's had significant problems with left lower extremity pain and weakness has severe spondylitic disease at multiple levels in his lumbar spine including L2-3 L3-4 and L4-5 I discussed with him consideration of a abstention surgery to decompress and stabilize his lumbar spine from L2-L5 and we've tried all manner of conservative treatment including a minimally invasive decompression on the left side at L3-L4 this is not given him substantial relief he is having increasing pain and weakness in the left leg and after some consideration he is now being admitted to undergo surgical decompression and stabilization from L2-L5.  Past Medical History:  Diagnosis Date  . Anxiety    with diagnosis  . 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 Depression   . Gall stones   . High cholesterol   . History of kidney stones   . Hypertension    EKG,  chest  4/13 EPIC  . 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  . Nephrolithiasis   . Neuromuscular disorder ([place])    facial nerve pain    Past Surgical History:  Procedure Laterality Date  . BACK SURGERY    . Left total hip arthroplasty  08/07/2010  . LUMBAR LAMINECTOMY/DECOMPRESSION MICRODISCECTOMY  02/09/2012   Procedure: LUMBAR LAMINECTOMY/DECOMPRESSION MICRODISCECTOMY 2 LEVELS;  Surgeon: Kristeen Miss, MD;  Location: [facility];  Service: Neurosurgery;  Laterality: Bilateral;  Bilateral Lumbar two-three,lumbar four-five laminectomies  . NEPHROLITHOTOMY Right 02/09/2014   Procedure: NEPHROLITHOTOMY PERCUTANEOUS;  Surgeon: Franchot Gallo, MD;  Location: [facility];  Service: Urology;  Laterality: Right;  . Right total hip arthroplasty  07/13/2014  . TOTAL HIP REVISION Left 05/14/2015   Procedure: TOTAL HIP REVISION;  Surgeon: Dereck Leep, MD;  Location: [facility] ORS;  Service: Orthopedics;  Laterality: Left;     Family History  Problem Relation Age of Onset  . Anesthesia problems Neg Hx    Social History:  reports that he has never smoked. He has never used smokeless tobacco. He reports that he drinks alcohol. He reports that he does not use drugs.  Allergies:  Allergies  Allergen Reactions  . Morphine And Related     Hallucinations    Medications Prior to Admission  Medication Sig Dispense Refill  . amLODipine (NORVASC) 10 MG tablet Take 10 mg by mouth daily.     . carbamazepine (TEGRETOL) 200 MG tablet Take 400 mg by mouth 2 (two) times daily.     . diphenhydramine-acetaminophen (TYLENOL PM) 25-500 MG TABS Take 1 tablet by mouth at bedtime.     . hydrochlorothiazide (HYDRODIURIL) 25 MG tablet Take 25 mg by mouth daily.     . Melatonin 5 MG CAPS Take 5 mg by mouth at bedtime.    . naproxen sodium (ANAPROX) 220 MG tablet Take 440 mg by mouth daily.    . traMADol (ULTRAM) 50 MG tablet Take 1-2 tablets (50-100 mg total) by mouth every 4 (four) hours as needed for moderate pain. (Patient taking differently: Take 50-100 mg by mouth 2 (two) times daily as needed for moderate pain. ) 60 tablet 0    Results for orders placed or performed during the hospital encounter of 11/07/16 (from the past 48 hour(s))  Type and screen [place]     Status: None   Collection Time: 11/07/16  7:13 AM  Result Value Ref Range   ABO/RH(D) O POS    Antibody Screen NEG    Sample Expiration 11/10/2016   ABO/Rh     Status: None   Collection Time: 11/07/16  7:15 AM  Result Value Ref Range   ABO/RH(D) O POS    No results found.  Review of Systems  HENT: Negative.   Eyes: Negative.   Respiratory: Negative.   Cardiovascular: Negative.   Gastrointestinal: Negative.   Genitourinary: Negative.   Musculoskeletal: Negative for back pain.  Skin: Negative.   Neurological: Positive for speech change, focal weakness and weakness.  Endo/Heme/Allergies: Negative.   Psychiatric/Behavioral:  Negative.     Blood pressure 134/83, pulse 79, temperature 97.9 F (36.6 C), temperature source Oral, resp. rate 20, weight 112 kg (247 lb), SpO2 96 %. Physical Exam  Constitutional: He is oriented to person, place, and time. He appears well-developed and well-nourished.  HENT:  Head: Normocephalic and atraumatic.  Eyes: Conjunctivae and EOM are normal. Pupils are equal, round, and reactive to light.  Neck: Normal range of motion. Neck supple.  Cardiovascular: Normal rate and regular rhythm.   Respiratory: Effort normal and breath sounds normal.  GI: Soft. Bowel sounds are normal.  Musculoskeletal:  Weakness in the left lower extremity and region of the iliopsoas and quadriceps on the left. Tone is normal but muscle bulk has decreased substantially in the quadriceps in the iliopsoas on the left. Patellar reflexes absent on the left side. Straight leg raising is positive at 15 on left negative on the right side 60 Patrick's maneuver is negative bilaterally  Neurological: He is alert and oriented to person, place, and time.  Patient walks with marked antalgia involving the left lower extremity. He has weakness in the iliopsoas and quadriceps on the left significant muscle wasting  Skin: Skin is warm and dry.  Psychiatric: He has a normal mood and affect. His behavior is normal. Judgment and thought content normal.     Assessment/Plan Spondylosis and stenosis L2-3 L3-4 L4-5 with radiculopathy and neurogenic claudication.  He compression and fusion L2-L5.  Earleen Newport, MD 11/07/2016, 11:51 AM

## 2016-11-07 NOTE — Progress Notes (Signed)
**Note Bryan-Identified via Obfuscation** Patient ID: Bryan Frey, male   DOB: 08/15/1946, 71 y.o.   MRN: [id number] Arouses to voice postoperatively Moving all 4 extremities well Moderate back pain Stable postop

## 2016-11-07 NOTE — Anesthesia Procedure Notes (Signed)
Procedure Name: Intubation Date/Time: 11/07/2016 12:00 PM Performed by: Izora Gala Pre-anesthesia Checklist: Patient identified, Emergency Drugs available, Suction available and Patient being monitored Patient Re-evaluated:Patient Re-evaluated prior to inductionOxygen Delivery Method: Circle system utilized Preoxygenation: Pre-oxygenation with 100% oxygen Intubation Type: IV induction Ventilation: Oral airway inserted - appropriate to patient size and Two handed mask ventilation required Laryngoscope Size: Miller and 3 Grade View: Grade II Tube type: Oral Tube size: 7.5 mm Number of attempts: 1 Placement Confirmation: ETT inserted through vocal cords under direct vision,  positive ETCO2 and breath sounds checked- equal and bilateral Secured at: 23 cm Tube secured with: Tape Dental Injury: Teeth and Oropharynx as per pre-operative assessment  Comments: Thick beard, difficult to create and maintain seal.  Two hands required and Sux for rapid intubation conditions.

## 2016-11-07 NOTE — Transfer of Care (Signed)
Immediate Anesthesia Transfer of Care Note  Patient: Bryan Frey  Procedure(s) Performed: Procedure(s): LUMBAR TWO=THREE,LUMBAR THREE-FOUR,LUMBAR FOUR-FIVE POSTERIOR LUMBAR INTERBODY FUSION (N/A)  Patient Location: PACU  Anesthesia Type:General  Level of Consciousness: awake, alert , oriented and patient cooperative  Airway & Oxygen Therapy: Patient Spontanous Breathing and Patient connected to nasal cannula oxygen  Post-op Assessment: Report given to RN, Post -op Vital signs reviewed and stable, Patient moving all extremities and Patient moving all extremities X 4  Post vital signs: Reviewed and stable  Last Vitals:  Vitals:   11/07/16 0721  BP: 134/83  Pulse: 79  Resp: 20  Temp: 36.6 C    Last Pain:  Vitals:   11/07/16 0721  TempSrc: Oral      Patients Stated Pain Goal: 3 ([id number])  Complications: No apparent anesthesia complications

## 2016-11-07 NOTE — Anesthesia Postprocedure Evaluation (Addendum)
Anesthesia Post Note  Patient: Bryan Frey  Procedure(s) Performed: Procedure(s) (LRB): LUMBAR TWO=THREE,LUMBAR THREE-FOUR,LUMBAR FOUR-FIVE POSTERIOR LUMBAR INTERBODY FUSION (N/A)  Patient location during evaluation: PACU Anesthesia Type: General Level of consciousness: sedated Pain management: pain level controlled Vital Signs Assessment: post-procedure vital signs reviewed and stable Respiratory status: spontaneous breathing and respiratory function stable Cardiovascular status: stable Anesthetic complications: no       Last Vitals:  Vitals:   11/07/16 2030 11/07/16 2045  BP: (!) 177/82 (!) 181/79  Pulse: 92 92  Resp: 17 18  Temp:      Last Pain:  Vitals:   11/07/16 2030  TempSrc:   PainSc: [name]

## 2016-11-07 NOTE — Op Note (Signed)
Date of surgery: 11/07/2016 Preoperative diagnosis: Lumbar spondylosis and stenosis with left sided radiculopathy L2-3 L3-4 L4-5. Postoperative diagnosis: Same Procedure: Laminectomy L2 L3 L4, decompression of L2-L3 L4 and L5 nerve roots with more work than require for simple interbody technique. Posterior lumbar interbody arthrodesis using peek spacers local autograft. Segmental fixation L2-L5 with pedicle screws, posterior lateral arthrodesis with local autograft L2-L5. Surgeon: Kristeen Miss First assistant: Ashley Jacobs M.D. Anesthesia: Gen. endotracheal Indications: Bryan Frey is a 71 year old individual's had significant back and left lower extremity pain he has had previous surgical decompression for spondylitic stenosis a number of years ago but now it appears that his stenosis is recurring spondylosis is worsening in his radicular pain is worsening also. He's been advised regarding surgery.  Procedure: The patient was brought to the operating room supine on a stretcher. After the smooth induction of general endotracheal anesthesia, he was turned prone. The back was prepped with alcohol DuraPrep and draped in a sterile fashion. Midline incision was made through the previous incisions and this was carried down to the lumbar dorsal fascia. A subperiosteal dissection was undertaken between L2 and L5 and the spinous processes were identified positively on the radiograph. Then by dissecting out over the facet joints at L2-3 3445 each of these joints was isolated. The transverse processes were decorticated and packed off for later use in grafting. Laminectomy was then created removing the inferior margin lamina of L2 out to and including the entirety of the facet of L2-L3. Entire laminar arch of L3 and L4 were removed and the interspaces and common dural tube were then explored. Significant redundant tissue was removed from the lateral recesses particularly on the left side to decompress the L2  nerve root the L3 nerve root the L4 nerve root and the L5 nerve roots separately. This was done with combination of curettes rongeurs and small nerve root dissectors. Once the decompression was obtained the disc spaces were isolated. Total discectomies were then performed at L2-3, L3-4, and L4-5. The endplates were completely decorticated and the interspaces were sized for appropriate size peek spacer. At L3-4 is felt that an 8 mm tall 28 mm long 8 lordotic spacer would fit best to these were packed and placed into the interval along with 9 mL of autograft. At L4-5-1 10 mm tall 12 lordotic spacer was placed ventrally into the interspace along with 16 mL of autograft. At L2-3 there is noted be substantial sclerosis on the right side disc space could not be entered on the left side the disc space was entered and decompression of this space was performed a 4 lordotic 8 mm tall spacer was placed into the interspace any interspace was filled with an additional 3 mL of autograft. Lateral gutters which were previously decorticated were then unpacked and pedicle entry sites were chosen at L2 L3 L4 and L5 under radiographic control. 6.5 x 50 mm screws were placed and each of these pedicles. Then precontoured 100 mm rods were seated into the saddles and adjusted appropriately to allow for a neutral construct. The hardware was tightened and not preloaded. The lateral gutters were then packed with remainder of the autograft for a total volume of 15 mL of bone graft in either lateral gutter. Hemostasis was then checked and the soft tissues and once verified the pads of the individual nerve roots were made sure to be clear and then the lumbar dorsal fascia was reapproximated with #1 Vicryl interrupted fashion, 2-0 Vicryl was obtained tissues, 3-0 Vicryl  subcuticular. Dermabond was placed on the skin. Blood loss was estimated at some 750 mL and 250 mL of Cell Saver blood was returned to the patient.

## 2016-11-08 DIAGNOSIS — L8990 Pressure ulcer of unspecified site, unspecified stage: Secondary | ICD-10-CM | POA: Insufficient documentation

## 2016-11-08 LAB — CBC
HCT: 31.1 % — ABNORMAL LOW (ref 39.0–52.0)
HEMOGLOBIN: 10.3 g/dL — AB (ref 13.0–17.0)
MCH: 30 pg (ref 26.0–34.0)
MCHC: 33.1 g/dL (ref 30.0–36.0)
MCV: 90.7 fL (ref 78.0–100.0)
Platelets: 185 10*3/uL (ref 150–400)
RBC: 3.43 MIL/uL — ABNORMAL LOW (ref 4.22–5.81)
RDW: 13 % (ref 11.5–15.5)
WBC: 10.8 10*3/uL — ABNORMAL HIGH (ref 4.0–10.5)

## 2016-11-08 LAB — BASIC METABOLIC PANEL
Anion gap: 6 (ref 5–15)
BUN: 17 mg/dL (ref 6–20)
CHLORIDE: 107 mmol/L (ref 101–111)
CO2: 27 mmol/L (ref 22–32)
CREATININE: 1.33 mg/dL — AB (ref 0.61–1.24)
Calcium: 8 mg/dL — ABNORMAL LOW (ref 8.9–10.3)
GFR calc Af Amer: >60 mL/min (ref >60)
GFR calc non Af Amer: 52 mL/min — ABNORMAL LOW (ref >60)
Glucose, Bld: 139 mg/dL — ABNORMAL HIGH (ref 65–99)
Potassium: 4.5 mmol/L (ref 3.5–5.1)
SODIUM: 140 mmol/L (ref 135–145)

## 2016-11-08 MED ORDER — OXYCODONE HCL 5 MG PO TABS
10.0000 mg | ORAL_TABLET | ORAL | Status: AC | PRN
  Administered 2016-11-08 – 2016-11-09 (×2): 10 mg via ORAL
  Filled 2016-11-08 (×3): qty 2

## 2016-11-08 NOTE — Evaluation (Signed)
Occupational Therapy Evaluation Patient Details Name: Bryan Frey MRN: [id number] DOB: 1945-10-30 Today's Date: 11/08/2016    History of Present Illness Pt is a 71 y/o male s/p L2-L5 decompression and fusion. PMH including but not limited to L total hip revision, hx of back surgeries x2 (2011 and 2013).   Clinical Impression   Pt admitted with above. He demonstrates the below listed deficits and will benefit from continued OT to maximize safety and independence with BADLs.  Pt requires max A for LB ADLs and min A for functional mobility.  Eval was limited by nausea and dry heaves.  He requires min cues for back precautions.  Wife is very supportive and pt has all needed DME and AE at home.  Anticipate he will progress well with therapies.  Will continue to follow.       Follow Up Recommendations  No OT follow up;Supervision/Assistance - 24 hour    Equipment Recommendations  None recommended by OT    Recommendations for Other Services       Precautions / Restrictions Precautions Precautions: Back;Fall Precaution Comments: pt able to state 3/3 back precautions, but requires min cues  Required Braces or Orthoses: Spinal Brace Spinal Brace: Lumbar corset;Applied in sitting position Restrictions Weight Bearing Restrictions: No      Mobility Bed Mobility Overal bed mobility: Needs Assistance Bed Mobility: Rolling;Sidelying to Sit;Sit to Sidelying Rolling: Min assist Sidelying to sit: Mod assist     Sit to sidelying: Mod assist General bed mobility comments: Pt requires step by step instruction for technique and assist to move LEs off EOB and to move into sitting   Transfers Overall transfer level: Needs assistance Equipment used: Rolling walker (2 wheeled) Transfers: Sit to/from [facility] Sit to Stand: Min assist Stand pivot transfers: Min guard       General transfer comment: verbal cues for hand placement and assist to move into standing      Balance Overall balance assessment: Needs assistance Sitting-balance support: Feet supported Sitting balance-Leahy Scale: Fair     Standing balance support: Bilateral upper extremity supported;During functional activity Standing balance-Leahy Scale: Poor Standing balance comment: reliant on bil UE support                             ADL Overall ADL's : Needs assistance/impaired Eating/Feeding: Independent   Grooming: Wash/dry hands;Wash/dry face;Minimal assistance;Standing   Upper Body Bathing: Set up;Supervision/ safety;Sitting   Lower Body Bathing: Maximal assistance;Sit to/from stand   Upper Body Dressing : Set up;Sitting   Lower Body Dressing: Total assistance;Sit to/from stand Lower Body Dressing Details (indicate cue type and reason): pt unable to access feet  Toilet Transfer: Minimal assistance;Ambulation;Comfort height toilet;Regular Toilet;BSC;RW;Grab bars Toilet Transfer Details (indicate cue type and reason): requires assist to move sit to stand  Toileting- Clothing Manipulation and Hygiene: Moderate assistance;Sit to/from stand       Functional mobility during ADLs: Minimal assistance;Rolling walker General ADL Comments: Pt limited by nausea and drive heaves during activity      Vision     Perception     Praxis      Pertinent Vitals/Pain Pain Assessment: 0-10 Pain Score: 10-Worst pain ever Pain Location: back Pain Descriptors / Indicators: Aching;Grimacing;Guarding;Sore Pain Intervention(s): Monitored during session;Repositioned;Premedicated before session     Hand Dominance Right   Extremity/Trunk Assessment Upper Extremity Assessment Upper Extremity Assessment: Overall WFL for tasks assessed   Lower Extremity Assessment Lower Extremity  Assessment: Defer to PT evaluation   Cervical / Trunk Assessment Cervical / Trunk Assessment: Other exceptions Cervical / Trunk Exceptions: s/p lumbar sx   Communication  Communication Communication: No difficulties   Cognition Arousal/Alertness: Awake/alert Behavior During Therapy: WFL for tasks assessed/performed Overall Cognitive Status: Within Functional Limits for tasks assessed                     General Comments       Exercises       Shoulder Instructions      Home Living Family/patient expects to be discharged to:: Private residence Living Arrangements: Spouse/significant other Available Help at Discharge: Family;Available 24 hours/day Type of Home: House Home Access: Stairs to enter [employer] of Steps: 2 Entrance Stairs-Rails: Right;Left Home Layout: One level     Bathroom Shower/Tub: Walk-in shower (walk-in shower with a small step over)   Bathroom Toilet: Standard Bathroom Accessibility: Yes   Home Equipment: Walker - 4 wheels;Cane - single point;Grab bars - toilet;Grab bars - tub/shower;Hand held shower head;Adaptive equipment Adaptive Equipment: Reacher;Sock aid;Long-handled shoe horn;Long-handled sponge        Prior Functioning/Environment Level of Independence: Independent with assistive device(s)        Comments: pt reported that he uses a SPC to ambulate        OT Problem List: Decreased strength;Decreased activity tolerance;Impaired balance (sitting and/or standing);Decreased safety awareness;Decreased knowledge of use of DME or AE;Decreased knowledge of precautions;Pain   OT Treatment/Interventions: Self-care/ADL training;DME and/or AE instruction;Therapeutic activities;Patient/family education;Balance training    OT Goals(Current goals can be found in the care plan section) Acute Rehab OT Goals Patient Stated Goal: decrease pain and regain independence  OT Goal Formulation: With patient/family Time For Goal Achievement: 11/15/16 Potential to Achieve Goals: Good ADL Goals Pt Will Perform Grooming: with supervision;standing Pt Will Perform Lower Body Bathing: with supervision;with  adaptive equipment;sit to/from stand Pt Will Perform Lower Body Dressing: with supervision;with adaptive equipment;sit to/from stand Pt Will Transfer to Toilet: with supervision;ambulating;regular height toilet;bedside commode;grab bars Pt Will Perform Tub/Shower Transfer: Shower transfer;with supervision;ambulating;shower seat;rolling walker  OT Frequency: Min 2X/week   Barriers to D/C:            Co-evaluation              End of Session Equipment Utilized During Treatment: Rolling walker;Gait belt;Back brace Nurse Communication: Mobility status  Activity Tolerance: Treatment limited secondary to medical complications (Comment);Patient limited by pain (nausea and dry heaves ) Patient left: in bed;with call bell/phone within reach;with bed alarm set;with family/visitor present;with nursing/sitter in room   Time: [id number] OT Time Calculation (min): 32 min Charges:  OT General Charges $OT Visit: 1 Procedure OT Evaluation $OT Eval Moderate Complexity: 1 Procedure OT Treatments $Self Care/Home Management : 8-22 mins G-Codes:    Eriel Doyon M November 22, 2016, 6:55 PM

## 2016-11-08 NOTE — Progress Notes (Signed)
**Note Bryan-Identified via Obfuscation** Patient ID: Bryan Frey, male   DOB: Sep 13, 1946, 71 y.o.   MRN: [id number] Vital signs are stable Patient awaiting to be ambulated today Dressing is dry and intact on back Postoperative hemoglobin is 10.8 Electrolytes are stable Plan mobilization today Transfer to floor later this afternoon if stable

## 2016-11-08 NOTE — Evaluation (Signed)
Physical Therapy Evaluation Patient Details Name: Bryan Frey MRN: [id number] DOB: [birth year] Today's Date: 11/08/2016   History of Present Illness  Pt is a 71 y/o male s/p L2-L5 decompression and fusion. PMH including but not limited to L total hip revision, hx of back surgeries x2 (2011 and 2013).  Clinical Impression  Pt presented supine in bed with HOB elevated, awake and willing to participate in therapy session. Prior to admission, pt reported that he was mod I for mobility using a SPC to ambulate and independent with ADLs. Pt currently requires min-mod A for bed mobility with VC'ing for log roll technique, min guard for transfers and min guard for ambulation with RW. Pt also requires min A to don lumbar corset in sitting. All VSS throughout. Pt would continue to benefit from skilled physical therapy services at this time while admitted and after d/c to address his below listed limitations in order to improve his overall safety and independence with functional mobility.      Follow Up Recommendations No PT follow up;Supervision/Assistance - 24 hour    Equipment Recommendations  None recommended by PT    Recommendations for Other Services       Precautions / Restrictions Precautions Precautions: Back;Fall Precaution Comments: PT reviewed 3/3 back precautions with pt and pt's wife.  Required Braces or Orthoses: Spinal Brace Spinal Brace: Lumbar corset;Applied in sitting position Restrictions Weight Bearing Restrictions: No      Mobility  Bed Mobility Overal bed mobility: Needs Assistance Bed Mobility: Rolling;Sidelying to Sit Rolling: Min assist Sidelying to sit: Mod assist       General bed mobility comments: pt required increased time, VC'ing for log roll technique, min A at pelvis to roll to L and mod A at trunk to achieve sitting EOB  Transfers Overall transfer level: Needs assistance Equipment used: Rolling walker (2 wheeled) Transfers: Sit to/from  Stand Sit to Stand: Min guard;From elevated surface         General transfer comment: increased time, min guard for safety  Ambulation/Gait Ambulation/Gait assistance: Min guard Ambulation Distance (Feet): 20 Feet Assistive device: Rolling walker (2 wheeled) Gait Pattern/deviations: Step-through pattern;Decreased step length - right;Decreased step length - left;Decreased stride length Gait velocity: decreased Gait velocity interpretation: Below normal speed for age/gender General Gait Details: mild instability with gait, no LOB, min guard for safety  Stairs            Wheelchair Mobility    Modified Rankin (Stroke Patients Only)       Balance Overall balance assessment: Needs assistance Sitting-balance support: Feet supported Sitting balance-Leahy Scale: Fair Sitting balance - Comments: min A to don lumbar corset in sitting   Standing balance support: During functional activity;Bilateral upper extremity supported Standing balance-Leahy Scale: Poor Standing balance comment: pt reliant on bilateral UEs on RW                             Pertinent Vitals/Pain Pain Assessment: 0-10 Pain Score: 10-Worst pain ever Pain Location: back Pain Descriptors / Indicators: Sore;Grimacing;Guarding Pain Intervention(s): Monitored during session;Repositioned    Home Living Family/patient expects to be discharged to:: Private residence Living Arrangements: Spouse/significant other Available Help at Discharge: Family;Available 24 hours/day Type of Home: House Home Access: Stairs to enter Entrance Stairs-Rails: Psychiatric nurse of Steps: 2 Home Layout: One level Home Equipment: Walker - 4 wheels;Cane - single point;Grab bars - toilet;Grab bars - tub/shower;Hand held shower head  Prior Function Level of Independence: Independent with assistive device(s)         Comments: pt reported that he uses a SPC to ambulate     Hand Dominance    Dominant Hand: Right    Extremity/Trunk Assessment   Upper Extremity Assessment Upper Extremity Assessment: Overall WFL for tasks assessed    Lower Extremity Assessment Lower Extremity Assessment: Overall WFL for tasks assessed    Cervical / Trunk Assessment Cervical / Trunk Assessment: Other exceptions Cervical / Trunk Exceptions: s/p lumbar sx  Communication   Communication: No difficulties  Cognition Arousal/Alertness: Awake/alert Behavior During Therapy: WFL for tasks assessed/performed Overall Cognitive Status: Within Functional Limits for tasks assessed                      General Comments      Exercises     Assessment/Plan    PT Assessment Patient needs continued PT services  PT Problem List Decreased strength;Decreased range of motion;Decreased activity tolerance;Decreased balance;Decreased mobility;Decreased coordination;Decreased knowledge of use of DME;Decreased safety awareness;Decreased knowledge of precautions;Pain          PT Treatment Interventions DME instruction;Gait training;Stair training;Functional mobility training;Therapeutic activities;Therapeutic exercise;Balance training;Neuromuscular re-education;Patient/family education    PT Goals (Current goals can be found in the Care Plan section)  Acute Rehab PT Goals Patient Stated Goal: decrease pain, return home PT Goal Formulation: With patient Time For Goal Achievement: 11/22/16 Potential to Achieve Goals: Good    Frequency Min 5X/week   Barriers to discharge        Co-evaluation               End of Session Equipment Utilized During Treatment: Gait belt;Back brace Activity Tolerance: Patient limited by pain Patient left: in chair;with call bell/phone within reach;with nursing/sitter in room Nurse Communication: Mobility status         Time: 0930-1007 PT Time Calculation (min) (ACUTE ONLY): 37 min   Charges:   PT Evaluation $PT Eval Moderate Complexity: 1  Procedure PT Treatments $Gait Training: 8-22 mins   PT G CodesClearnce Sorrel Nariyah Osias 11/08/2016, 10:28 AM Sherie Don, PT, DPT [phone]

## 2016-11-08 NOTE — Progress Notes (Signed)
CSW c/s received.  PT is not recommending f/u at this time.  CSW signing off, please re-consult as necessary.  Jeral Fruit Hayslee Casebolt,LCSW Weekend Coverage [phone]

## 2016-11-08 NOTE — Progress Notes (Signed)
OT Cancellation Note  Patient Details Name: Bryan Frey MRN: [id number] DOB: [birth year]   Cancelled Treatment:    Reason Eval/Treat Not Completed: Pt attempting to urinate in supine and requests OT come back.  Attempted to encourage pt to ambulate to BR to urinate, but adamantly refused.  Will try back.  [place], OTR/L [id number]   Lucille Passy M 11/08/2016, 3:18 PM

## 2016-11-09 NOTE — Progress Notes (Signed)
Pt seen and examined.  Last night pain was severe but has improved this morning. Pain is worse after working with OT/PT as expected.  No new neurological symptoms Minimal appetite but tolerating po when eating   EXAM: Temp:  [98.8 F (37.1 C)-100.4 F (38 C)] 99.3 F (37.4 C) (02/04 0556) Pulse Rate:  [85-94] 85 (02/04 0556) Resp:  [18] 18 (02/04 0556) BP: (120-130)/(45-63) 124/62 (02/04 0556) SpO2:  [92 %-95 %] 92 % (02/04 0556) Intake/Output      02/03 0701 - 02/04 0700 02/04 0701 - 02/05 0700   P.O. 480    I.V. (mL/kg) 3300 (29.4)    Blood     Other     IV Piggyback     Total Intake(mL/kg) 3780 (33.6)    Urine (mL/kg/hr) 500 (0.2)    Blood     Total Output 500     Net +3280           Awake and alert Follows commands throughout Strength and sensory appropriate Wound without signs of infection or drainage  Plan Stable Not ready for discharge at this time Continue current care

## 2016-11-09 NOTE — Progress Notes (Signed)
PT Cancellation Note  Patient Details Name: Bryan Frey MRN: [id number] DOB: 04-May-1946   Cancelled Treatment:    Reason Eval/Treat Not Completed: Patient declined, just got back to bed with NT. PT to follow up later today around pt's next scheduled pain medication time to maximize activity tolerance.   Willow Ora 11/09/2016, 12:26 PM  Willow Ora, PTA, CLT Acute Rehab Services [phone] 11/09/16, 12:27 PM

## 2016-11-09 NOTE — Progress Notes (Signed)
Physical Therapy Treatment Patient Details Name: Bryan Frey MRN: [id number] DOB: 03/27/1946 Today's Date: 11/09/2016    History of Present Illness Pt is a 71 y/o male s/p L2-L5 decompression and fusion. PMH including but not limited to L total hip revision, hx of back surgeries x2 (2011 and 2013).    PT Comments    Pt is making steady progress toward goals, continues to be limited by pain. No dizziness or vomiting this session. Acute PT to continue during pt's hospital stay.  Follow Up Recommendations  No PT follow up;Supervision/Assistance - 24 hour     Equipment Recommendations  None recommended by PT    Precautions / Restrictions Precautions Precautions: Back;Fall Precaution Comments: pt able to recall 3/3 back precautions without cues/assistance Required Braces or Orthoses: Spinal Brace Spinal Brace: Lumbar corset;Applied in sitting position (min assist to position, then pt supervision to secure)    Mobility  Bed Mobility Overal bed mobility: Needs Assistance Bed Mobility: Rolling;Sidelying to Sit Rolling: Min guard Sidelying to sit: Min assist       General bed mobility comments: bed flat and no rails used. cues on sequencing and technique. most assist needed with trunk elevation as pt able to bring legs off edge of bed  Transfers Overall transfer level: Needs assistance Equipment used: Rolling walker (2 wheeled) Transfers: Sit to/from Stand Sit to Stand: Min assist;From elevated surface         General transfer comment: cues for hand placement, bed elevated to home bed height  Ambulation/Gait Ambulation/Gait assistance: Min assist Ambulation Distance (Feet): 20 Feet Assistive device: Rolling walker (2 wheeled) Gait Pattern/deviations: Step-through pattern;Decreased stride length;Antalgic;Decreased stance time - right;Decreased step length - left Gait velocity: decreased Gait velocity interpretation: Below normal speed for age/gender General Gait  Details: cues on posture, sequencing and walker position with gait       Cognition   Behavior During Therapy: WFL for tasks assessed/performed Overall Cognitive Status: Within Functional Limits for tasks assessed           Pertinent Vitals/Pain Pain Assessment: 0-10 Pain Score: 7  Pain Location: back/right leg, increased from 6 to 7/10 with gait Pain Descriptors / Indicators: Aching;Radiating;Sore Pain Intervention(s): Limited activity within patient's tolerance;Monitored during session;Premedicated before session;Repositioned     PT Goals (current goals can now be found in the care plan section) Acute Rehab PT Goals Patient Stated Goal: decrease pain and regain independence  PT Goal Formulation: With patient Time For Goal Achievement: 11/22/16 Potential to Achieve Goals: Good Progress towards PT goals: Progressing toward goals    Frequency    Min 5X/week      PT Plan Current plan remains appropriate    End of Session Equipment Utilized During Treatment: Gait belt;Back brace Activity Tolerance: Patient tolerated treatment well;Patient limited by pain Patient left: in chair;with call bell/phone within reach;with family/visitor present     Time: [id number] PT Time Calculation (min) (ACUTE ONLY): 15 min  Charges:  $Gait Training: 8-22 mins           Willow Ora 11/09/2016, 2:08 PM   Willow Ora, PTA, [phone] 11/09/16, 2:09 PM

## 2016-11-09 NOTE — Progress Notes (Signed)
Occupational Therapy Treatment Patient Details Name: Bryan Frey MRN: [id number] DOB: 1946/03/25 Today's Date: 11/09/2016    History of present illness Pt is a 71 y/o male s/p L2-L5 decompression and fusion. PMH including but not limited to L total hip revision, hx of back surgeries x2 (2011 and 2013).   OT comments  This 71 yo male admitted and underwent above presents to acute OT today making gains in bed mobility and transfers all helping to increase independence in basic ADLs and decreasing burden of care on his wife. Progress is slow however and SNF is now recommended.  Follow Up Recommendations  SNF;Supervision/Assistance - 24 hour;Other (comment) (pt request [facility] in [place])    Equipment Recommendations  None recommended by OT       Precautions / Restrictions Precautions Precautions: Back;Fall Required Braces or Orthoses: Spinal Brace Spinal Brace: Lumbar corset;Applied in sitting position Restrictions Weight Bearing Restrictions: No       Mobility Bed Mobility Overal bed mobility: Needs Assistance Bed Mobility: Rolling;Sidelying to Sit Rolling: Min guard (use of rail and HOB flat) Sidelying to sit: Min assist          Transfers Overall transfer level: Needs assistance Equipment used: Rolling walker (2 wheeled) Transfers: Sit to/from Stand Sit to Stand: Min assist              Balance Overall balance assessment: Needs assistance Sitting-balance support: Feet supported;Bilateral upper extremity supported Sitting balance-Leahy Scale: Poor Sitting balance - Comments: Needed both hands for balance sitting EOB today (but his was mainly due to the curve in bed)   Standing balance support: Bilateral upper extremity supported;During functional activity Standing balance-Leahy Scale: Poor Standing balance comment: reliant on bil UE support                    ADL Overall ADL's : Needs assistance/impaired                            Toilet Transfer Details (indicate cue type and reason): min A for standing, total A for urinal (tried to stand over toilet but did not work)                            Agricultural engineer During Therapy: WFL for tasks assessed/performed Overall Cognitive Status: Within Functional Limits for tasks assessed                                    Pertinent Vitals/ Pain       Pain Assessment: 0-10 Pain Score: 10-Worst pain ever Pain Location: back (with movement--subsides somewhat at rest) Pain Descriptors / Indicators: Guarding (grabbing) Pain Intervention(s): Monitored during session;Premedicated before session         Frequency  Min 2X/week        Progress Toward Goals  OT Goals(current goals can now be found in the care plan section)  Progress towards OT goals: Progressing toward goals     Plan Discharge plan needs to be updated       End of Session Equipment Utilized During Treatment: Rolling walker;Gait belt;Back brace   Activity Tolerance Patient limited by fatigue (just ambulating to bathroom to stand and urinate wore him out)   Patient Left in chair;with call bell/phone within reach;with family/visitor present   Nurse Communication  Mobility status (NT, as well as pt urinated)        Time: [id number] OT Time Calculation (min): 31 min  Charges: OT General Charges $OT Visit: 1 Procedure OT Treatments $Self Care/Home Management : 23-37 mins  Almon Register [id number] 11/09/2016, 10:00 AM

## 2016-11-09 NOTE — Progress Notes (Signed)
On call notified of patient over acetaminophen 24hr limit in regards to pain medication.  On call ordered  oxycodone 10mg  q4h x2 to get patient out of the time frame as pharmacy suggested. Continue to monitor patient.

## 2016-11-10 MED FILL — Sodium Chloride IV Soln 0.9%: INTRAVENOUS | Qty: 2000 | Status: AC

## 2016-11-10 MED FILL — Thrombin For Soln 5000 Unit: CUTANEOUS | Qty: 5000 | Status: AC

## 2016-11-10 MED FILL — Heparin Sodium (Porcine) Inj 1000 Unit/ML: INTRAMUSCULAR | Qty: 30 | Status: AC

## 2016-11-10 NOTE — Progress Notes (Signed)
Occupational Therapy Treatment Patient Details Name: Bryan Frey MRN: [id number] DOB: [birth year] Today's Date: 11/10/2016    History of present illness Pt is a 71 y/o male s/p L2-L5 decompression and fusion. PMH including but not limited to L total hip revision, hx of back surgeries x2 (2011 and 2013).   OT comments  Pt progressing toward OT goals. Able to complete toilet transfer and toileting hygeine with min assist this session. Pt required min assist to don brace in sitting due to need for UE support to maintain balance. Continue to feel that pt would benefit from short-term SNF placement for continued rehabilitation in order to maximize PLOF prior to return home. OT will continue to follow acutely.    Follow Up Recommendations  SNF;Supervision/Assistance - 24 hour    Equipment Recommendations  None recommended by OT    Recommendations for Other Services      Precautions / Restrictions Precautions Precautions: Back;Fall Precaution Comments: Pt able to recall 3/3 back precautions with increased time. Required Braces or Orthoses: Spinal Brace Spinal Brace: Lumbar corset;Applied in sitting position (Assisted to position brace) Restrictions Weight Bearing Restrictions: No       Mobility Bed Mobility Overal bed mobility: Needs Assistance Bed Mobility: Rolling;Sidelying to Sit Rolling: Supervision Sidelying to sit: Supervision       General bed mobility comments: VC's for technique and supervision for safety.  Transfers Overall transfer level: Needs assistance Equipment used: Rolling walker (2 wheeled) Transfers: Sit to/from Stand Sit to Stand: Min assist;From elevated surface              Balance Overall balance assessment: Needs assistance Sitting-balance support: Feet supported;Single extremity supported Sitting balance-Leahy Scale: Poor Sitting balance - Comments: Pt requiring min guard assist and single UE support for static sitting tasks.   Standing  balance support: Bilateral upper extremity supported Standing balance-Leahy Scale: Poor Standing balance comment: Requires UE support for all standing activities.                   ADL Overall ADL's : Needs assistance/impaired                 Upper Body Dressing : Minimal assistance;Sitting Upper Body Dressing Details (indicate cue type and reason): Min assist to don brace in sitting     Toilet Transfer: Ambulation;Minimal assistance;BSC;RW Toilet Transfer Details (indicate cue type and reason): Min assist while standing. Toileting- Clothing Manipulation and Hygiene: Minimal assistance;Sit to/from stand       Functional mobility during ADLs: Minimal assistance;Rolling walker General ADL Comments: Pt with improved activity tolerance for ADL this session as compared with OT evaluation.      Vision                     Perception     Praxis      Cognition   Behavior During Therapy: WFL for tasks assessed/performed Overall Cognitive Status: Within Functional Limits for tasks assessed                       Extremity/Trunk Assessment               Exercises     Shoulder Instructions       General Comments      Pertinent Vitals/ Pain       Pain Assessment: 0-10 Pain Score: 7  Pain Descriptors / Indicators: Operative site guarding;Guarding;Grimacing Pain Intervention(s): Monitored during session;Repositioned  Home Living  Prior Functioning/Environment              Frequency  Min 2X/week        Progress Toward Goals  OT Goals(current goals can now be found in the care plan section)  Progress towards OT goals: Progressing toward goals  Acute Rehab OT Goals Patient Stated Goal: decrease pain and regain independence  OT Goal Formulation: With patient/family Time For Goal Achievement: 11/15/16 Potential to Achieve Goals: Good ADL Goals Pt Will Perform  Grooming: with supervision;standing Pt Will Perform Lower Body Bathing: with supervision;with adaptive equipment;sit to/from stand Pt Will Perform Lower Body Dressing: with supervision;with adaptive equipment;sit to/from stand Pt Will Transfer to Toilet: with supervision;ambulating;regular height toilet;bedside commode;grab bars Pt Will Perform Tub/Shower Transfer: Shower transfer;with supervision;ambulating;shower seat;rolling walker  Plan Discharge plan remains appropriate    Co-evaluation                 End of Session Equipment Utilized During Treatment: Rolling walker;Gait belt;Back brace   Activity Tolerance Patient tolerated treatment well   Patient Left in chair;with call bell/phone within reach;with family/visitor present   Nurse Communication          Time: [id number] OT Time Calculation (min): 21 min  Charges: OT General Charges $OT Visit: 1 Procedure OT Treatments $Self Care/Home Management : 8-22 mins  Norman Herrlich, OTR/L [phone] 11/10/2016, 5:22 PM

## 2016-11-10 NOTE — Consult Note (Signed)
[facility] CM Primary Care Navigator  11/10/2016  BONIFACE GOFFE III 1946-03-22 [id number]  Met with patient and wife Coralyn Mark) at the bedside to identify possible discharge needs. Patient reports progressive weakness and pain to his left leg that had led to this admission/surgery.  Patient endorses Dr. [name] with [facility] as the primary care provider.    Patient shared using [facility] pharmacy in [place] to obtain medications without any problem.   Patient verbalized managing his own medications at home straight out of the containers.   Patient's wife mostly provides transportation to his doctors' appointments per patient.    Wife is the primary caregiver at home as stated.  Discharge plan is skilled nursing facility for short term rehabilitation and patient voiced preference for [facility] as it is closer to home..  Patient and wife expressed understanding to call primary care provider's office when he returns back to home, for a post discharge follow-up appointment within a week or sooner if needs arise. Patient letter (with PCP's contact number) provided as their reminder.  Both patient and wife denied any other needs or concerns with patient's health management at this time.  For additional questions please contact:  Edwena Felty A. Hagan Maltz, BSN, RN-BC [facility] PRIMARY CARE Navigator Cell: [phone]

## 2016-11-10 NOTE — NC FL2 (Signed)
[place] LEVEL OF CARE SCREENING TOOL     IDENTIFICATION  Patient Name: Bryan Frey Birthdate: 06/13/1946 Sex: male Admission Date (Current Location): 11/07/2016  [facility] [place] Number:  Herbalist and Address:  [facility] [place] [place] [address] [place], [place] [address]      Provider Number: [id number]  Attending Physician Name and Address:  Kristeen Miss, MD  Relative Name and Phone Number:       Current Level of Care: Hospital Recommended Level of Care: [place] Prior Approval Number:    Date Approved/Denied:   PASRR Number: [id number]  Discharge Plan: Home    Current Diagnoses: Patient Active Problem List   Diagnosis Date Noted  . Pressure injury of skin 11/08/2016  . Lumbar radiculopathy, chronic 11/07/2016  . 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 Renal carcinoma ([place])   . 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 Calculus of kidney 02/09/2014  . Nephrolithiasis   . High cholesterol     Orientation RESPIRATION BLADDER Height & Weight     Self, Time, Situation, Place  Normal Continent Weight: 247 lb 12.8 oz (112.4 kg) Height:  5\' 11"  (180.3 cm)  BEHAVIORAL SYMPTOMS/MOOD NEUROLOGICAL BOWEL NUTRITION STATUS      Continent Diet (Regular Diet, Thin Liquids)  AMBULATORY STATUS COMMUNICATION OF NEEDS Skin   Limited Assist Verbally Normal, Surgical wounds                       Personal Care Assistance Level of Assistance  Bathing, Dressing, Feeding Bathing Assistance: Limited assistance Feeding assistance: Independent Dressing Assistance: Limited assistance     Functional Limitations Info  Sight, Hearing, Speech Sight Info: Adequate Hearing Info: Adequate Speech Info: Adequate    SPECIAL CARE FACTORS FREQUENCY  PT (By licensed PT), OT (By licensed OT)     PT Frequency: 5 OT Frequency: 5            Contractures Contractures Info: Not present    Additional  Factors Info  Code Status Code Status Info: Full Code Allergies Info: Morphine and related           Current Medications (11/10/2016):  This is the current hospital active medication list Current Facility-Administered Medications  Medication Dose Route Frequency Provider Last Rate Last Dose  . 0.9 %  sodium chloride infusion  250 mL Intravenous Continuous Kristeen Miss, MD      . 0.9 %  sodium chloride infusion   Intravenous Continuous Kristeen Miss, MD 150 mL/hr at 11/09/16 0108    . acetaminophen (TYLENOL) tablet 650 mg  650 mg Oral Q4H PRN Kristeen Miss, MD       Or  . acetaminophen (TYLENOL) suppository 650 mg  650 mg Rectal Q4H PRN Kristeen Miss, MD      . alum & mag hydroxide-simeth (MAALOX/MYLANTA) 200-200-20 MG/5ML suspension 30 mL  30 mL Oral Q6H PRN Kristeen Miss, MD      . amLODipine (NORVASC) tablet 10 mg  10 mg Oral Daily Kristeen Miss, MD   10 mg at 11/10/16 1001  . bisacodyl (DULCOLAX) suppository 10 mg  10 mg Rectal Daily PRN Kristeen Miss, MD      . carbamazepine (TEGRETOL) tablet 400 mg  400 mg Oral BID Kristeen Miss, MD   400 mg at 11/10/16 1001  . dexamethasone (DECADRON) injection 2 mg  2 mg Intravenous Q12H Kristeen Miss, MD   2 mg at 11/08/16 1006  Or  . dexamethasone (DECADRON) tablet 2 mg  2 mg Oral Q12H Kristeen Miss, MD   2 mg at 11/10/16 1001  . docusate sodium (COLACE) capsule 100 mg  100 mg Oral BID Kristeen Miss, MD   100 mg at 11/10/16 1001  . hydrochlorothiazide (HYDRODIURIL) tablet 25 mg  25 mg Oral Daily Kristeen Miss, MD   25 mg at 11/10/16 1001  . HYDROcodone-acetaminophen (NORCO/VICODIN) 5-325 MG per tablet 1-2 tablet  1-2 tablet Oral Q4H PRN Kristeen Miss, MD   2 tablet at 11/08/16 0422  . menthol-cetylpyridinium (CEPACOL) lozenge 3 mg  1 lozenge Oral PRN Kristeen Miss, MD       Or  . phenol (CHLORASEPTIC) mouth spray 1 spray  1 spray Mouth/Throat PRN Kristeen Miss, MD      . methocarbamol (ROBAXIN) tablet 500 mg  500 mg Oral Q6H PRN Kristeen Miss, MD   500 mg at  11/10/16 1303   Or  . methocarbamol (ROBAXIN) 500 mg in dextrose 5 % 50 mL IVPB  500 mg Intravenous Q6H PRN Kristeen Miss, MD      . ondansetron [facility]) injection 4 mg  4 mg Intravenous Q4H PRN Kristeen Miss, MD   4 mg at 11/09/16 0858  . oxyCODONE-acetaminophen (PERCOCET/ROXICET) 5-325 MG per tablet 1-2 tablet  1-2 tablet Oral Q4H PRN Kristeen Miss, MD   2 tablet at 11/10/16 1303  . polyethylene glycol (MIRALAX / GLYCOLAX) packet 17 g  17 g Oral Daily PRN Kristeen Miss, MD   17 g at 11/09/16 0903  . senna (SENOKOT) tablet 8.6 mg  1 tablet Oral BID Kristeen Miss, MD   8.6 mg at 11/10/16 1001  . sodium chloride flush (NS) 0.9 % injection 3 mL  3 mL Intravenous Q12H Kristeen Miss, MD   3 mL at 11/10/16 1002  . sodium chloride flush (NS) 0.9 % injection 3 mL  3 mL Intravenous PRN Kristeen Miss, MD      . sodium phosphate (FLEET) 7-19 GM/118ML enema 1 enema  1 enema Rectal Once PRN Kristeen Miss, MD      . traMADol Veatrice [facility]bon) tablet 50-100 mg  50-100 mg Oral Q4H PRN Kristeen Miss, MD   100 mg at 11/10/16 0535     Discharge Medications: Please see discharge summary for a list of discharge medications.  Relevant Imaging Results:  Relevant Lab Results:   Additional Information SSN:  [id number]  Darden Dates, LCSW

## 2016-11-10 NOTE — Care Management Note (Addendum)
Case Management Note  Patient Details  Name: ZAYLYN ERNEY MRN: [id number] Date of Birth: 12-21-1945  Subjective/Objective:   S/p laminectomy                 Action/Plan:  PTA independent from home with wife.  CM spoke in depth with pt and wife; both voiced concerns with pt discharging home in current condition as wife has limited mobility - due to recent surgery and will not be able to provide adequate care/support for pt immediately post discharge.  Pt requested discharge to SNF - PT recommendation specified both HH and SNF.  CM spoke with PT and PT; PT in agreement that the safest discharge for pt will  SNF .  CSW consulted for SNF.     Expected Discharge Date:  11/09/16               Expected Discharge Plan:  Skilled Nursing Facility  In-House Referral:  Clinical Social Work  Discharge planning Services     Post Acute Care Choice:    Choice offered to:     DME Arranged:    DME Agency:     HH Arranged:    HH Agency:     Status of Service:  In process, will continue to follow  If discussed at Long Length of Stay Meetings, dates discussed:    Additional Comments: 11/10/2016 CM contacted attending service once in the am and then again during 1400 hour in regards to discharge consideration Maryclare Labrador, RN 11/10/2016, 9:52 AM

## 2016-11-10 NOTE — Progress Notes (Signed)
**Note Bryan-Identified via Obfuscation** Patient ID: Bryan Frey, male   DOB: 12-24-1945, 71 y.o.   MRN: [id number] Vital signs are stable Motor function appears good Pain management still somewhat difficult as patient receives multiple opioid medications Will streamline to singular medication and will be helpful for discharge soon

## 2016-11-10 NOTE — Progress Notes (Signed)
Physical Therapy Treatment Patient Details Name: Bryan Frey MRN: [id number] DOB: Jun 07, 1946 Today's Date: 11/10/2016    History of Present Illness Pt is a 71 y/o male s/p L2-L5 decompression and fusion. PMH including but not limited to L total hip revision, hx of back surgeries x2 (2011 and 2013).    PT Comments    Patient progressing with ambulation distance, though not able to stand without UE support.  Feel patient will need STSNF rehab prior to d/c home due to LE weakness, pain and fall risk.  Follow Up Recommendations  SNF;Supervision/Assistance - 24 hour     Equipment Recommendations  None recommended by PT    Recommendations for Other Services       Precautions / Restrictions Precautions Precautions: Back;Fall Required Braces or Orthoses: Spinal Brace Spinal Brace: Lumbar corset;Applied in sitting position (A to position brace) Restrictions Weight Bearing Restrictions: No    Mobility  Bed Mobility Overal bed mobility: Needs Assistance   Rolling: Supervision Sidelying to sit: Min guard       General bed mobility comments: used railing, assist for guiding trunk  Transfers Overall transfer level: Needs assistance Equipment used: Rolling walker (2 wheeled) Transfers: Sit to/from Stand Sit to Stand: Min assist;From elevated surface         General transfer comment: cues for hand placement  Ambulation/Gait Ambulation/Gait assistance: Min assist Ambulation Distance (Feet): 60 Feet Assistive device: Rolling walker (2 wheeled) Gait Pattern/deviations: Step-to pattern;Step-through pattern;Decreased stride length;Trunk flexed;Wide base of support     General Gait Details: cues for upright posture, assist for balance, heavy UE reliance with pain in hips and LE weakness   Stairs            Wheelchair Mobility    Modified Rankin (Stroke Patients Only)       Balance Overall balance assessment: Needs assistance Sitting-balance support: Feet  supported;Bilateral upper extremity supported;Single extremity supported Sitting balance-Leahy Scale: Poor Sitting balance - Comments: pt unable to balance EOB while donning brace and not supporting self with UE's, needed min A for balance   Standing balance support: Bilateral upper extremity supported Standing balance-Leahy Scale: Good Standing balance comment: heavy UE support in standing                    Cognition Arousal/Alertness: Awake/alert Behavior During Therapy: WFL for tasks assessed/performed Overall Cognitive Status: Within Functional Limits for tasks assessed                      Exercises      General Comments General comments (skin integrity, edema, etc.): wife present throughout and agrees unable to assist pt as needed for safety; discussed plan for SNF and likely safest for ambulance tranport      Pertinent Vitals/Pain Pain Score: 10-Worst pain ever Pain Location: up to 10 with mobility in back and R leg Pain Descriptors / Indicators: Operative site guarding;Guarding;Grimacing Pain Intervention(s): Monitored during session;Repositioned    Home Living                      Prior Function            PT Goals (current goals can now be found in the care plan section) Progress towards PT goals: Progressing toward goals    Frequency    Min 5X/week      PT Plan Discharge plan needs to be updated    Co-evaluation  End of Session Equipment Utilized During Treatment: Back brace;Gait belt Activity Tolerance: Patient limited by pain Patient left: with call bell/phone within reach;with family/visitor present;in chair     Time: [id number] PT Time Calculation (min) (ACUTE ONLY): 23 min  Charges:  $Gait Training: 8-22 mins $Self Care/Home Management: 8-22                    G Codes:      Reginia Naas 12/04/16, 12:38 PM  Magda Kiel, [place] 2016/12/04

## 2016-11-10 NOTE — Clinical Social Work Note (Signed)
Clinical Social Work Assessment  Patient Details  Name: Bryan Frey MRN: [id number] Date of Birth: 06-27-1946  Date of referral:  11/10/16               Reason for consult:  Facility Placement                Permission sought to share information with:  Family Supports, Customer service manager Permission granted to share information::  Yes, Verbal Permission Granted  Name::     Bryan Frey  Agency::     Relationship::  Spouse  Contact Information:  [phone]  Housing/Transportation Living arrangements for the past 2 months:  [place] of Information:  Patient Patient Interpreter Needed:  None Criminal Activity/Legal Involvement Pertinent to Current Situation/Hospitalization:  No - Comment as needed Significant Relationships:  Spouse Lives with:  Spouse Do you feel safe going back to the place where you live?  Yes Need for family participation in patient care:  Yes (Comment)  Care giving concerns:  No care giving needs identified.    Social Worker assessment / plan:  CSW met with pt to address consult for new SNF. Pt is ready for d/c today. CSW introduced herself and explained role of social work. P/T is recommending STR at SNF. Pt expressed interest in [facility] as it is close to home. CSW initiated SNF search. CSW will continue to follow.   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   Patient/Family's Understanding of and Emotional Response to Diagnosis, Current Treatment, and Prognosis:  Pt.'s spouse is supportive of pt.   Emotional Assessment Appearance:  Appears stated age Attitude/Demeanor/Rapport:   (appropriate) Affect (typically observed):  Appropriate, Accepting,  Pleasant Orientation:  Oriented to Self, Oriented to Place, Oriented to  Time, Oriented to Situation Alcohol / Substance use:  Other Psych involvement (Current and /or in the community):  No (Comment)  Discharge Needs  Concerns to be addressed:  Adjustment to Illness Readmission within the last 30 days:  No Current discharge risk:  Chronically ill Barriers to Discharge:  Continued Medical Work up   [employer], LCSW 11/10/2016, 5:15 PM

## 2016-11-10 NOTE — Progress Notes (Addendum)
OT Cancellation Note  Patient Details Name: Bryan Frey MRN: [id number] DOB: [birth year]   Cancelled Treatment:    Reason Eval/Treat Not Completed: Patient declined. Pt had just returned to bed and requesting that OT return at later time. Educated pt on importance of participation with ADL and pt reports understanding but continues to politely decline. OT will check back as able.  [address] OTR/L [id number] 11/10/2016, 1:25 PM

## 2016-11-11 MED ORDER — OXYCODONE-ACETAMINOPHEN 5-325 MG PO TABS: 1.0000 | ORAL_TABLET | ORAL | 0 refills | Status: DC | PRN

## 2016-11-11 MED ORDER — METHOCARBAMOL 500 MG PO TABS: 500.0000 mg | ORAL_TABLET | Freq: Four times a day (QID) | ORAL | 3 refills | Status: DC | PRN

## 2016-11-11 MED ORDER — DEXAMETHASONE 1 MG PO TABS: ORAL_TABLET | ORAL | 0 refills | Status: DC

## 2016-11-11 MED ORDER — OXYCODONE-ACETAMINOPHEN 5-325 MG PO TABS
1.0000 | ORAL_TABLET | ORAL | Status: DC | PRN
  Administered 2016-11-11 (×4): 2 via ORAL
  Administered 2016-11-12: 1 via ORAL
  Administered 2016-11-12: 2 via ORAL
  Filled 2016-11-11 (×5): qty 2
  Filled 2016-11-11: qty 1
  Filled 2016-11-11: qty 2

## 2016-11-11 NOTE — Progress Notes (Signed)
**Note Bryan-Identified via Obfuscation** Patient ID: Bryan Frey, male   DOB: 1945/12/12, 71 y.o.   MRN: [id number] [facility] Will plan discharge for the a.m.

## 2016-11-11 NOTE — Progress Notes (Signed)
Physical Therapy Treatment Patient Details Name: Bryan Frey MRN: [id number] DOB: January 20, 1946 Today's Date: 11/11/2016    History of Present Illness Pt is a 71 y/o male s/p L2-L5 decompression and fusion. PMH including but not limited to L total hip revision, hx of back surgeries x2 (2011 and 2013).    PT Comments    Patient progressing some with gait distance, but increased pain this session due to trying to wean from some pain meds.  Feel continued skilled PT in STSNF setting indicated prior to d/c home.   Follow Up Recommendations  SNF;Supervision/Assistance - 24 hour     Equipment Recommendations  None recommended by PT    Recommendations for Other Services       Precautions / Restrictions Precautions Precautions: Back;Fall Required Braces or Orthoses: Spinal Brace Spinal Brace: Lumbar corset;Applied in sitting position    Mobility  Bed Mobility               General bed mobility comments: up in chair  Transfers Overall transfer level: Needs assistance Equipment used: Rolling walker (2 wheeled) Transfers: Sit to/from Stand Sit to Stand: Min assist         General transfer comment: increased time and needed support when transitioning hands from chair to walker  Ambulation/Gait Ambulation/Gait assistance: Min assist Ambulation Distance (Feet): 120 Feet Assistive device: Rolling walker (2 wheeled) Gait Pattern/deviations: Step-through pattern;Trunk flexed;Shuffle;Decreased stride length     General Gait Details: cues for upright posture, assist for balance, heavy UE reliance with pain in hips and LE weakness; pushing himself to walk farther, but noted rushing to sit with more flexed posture    Stairs            Wheelchair Mobility    Modified Rankin (Stroke Patients Only)       Balance Overall balance assessment: Needs assistance   Sitting balance-Leahy Scale: Poor Sitting balance - Comments: UE support for sitting balance    Standing balance support: Bilateral upper extremity supported Standing balance-Leahy Scale: Poor Standing balance comment: Requires UE support for all standing activities.                    Cognition Arousal/Alertness: Awake/alert Behavior During Therapy: WFL for tasks assessed/performed Overall Cognitive Status: Within Functional Limits for tasks assessed                      Exercises Other Exercises Other Exercises: standing minisquats, x 10, heel raises and hip flexion x 10 in standing UE support    General Comments        Pertinent Vitals/Pain Pain Score: 8  Pain Location: back Pain Descriptors / Indicators: Operative site guarding;Crying;Grimacing;Guarding Pain Intervention(s): Monitored during session;Repositioned;Patient requesting pain meds-RN notified    Home Living                      Prior Function            PT Goals (current goals can now be found in the care plan section) Progress towards PT goals: Progressing toward goals    Frequency    Min 5X/week      PT Plan Current plan remains appropriate    Co-evaluation             End of Session Equipment Utilized During Treatment: Gait belt;Back brace Activity Tolerance: Patient limited by pain Patient left: in chair;with call bell/phone within reach;with family/visitor present     Time:  [id number] PT Time Calculation (min) (ACUTE ONLY): 19 min  Charges:  $Gait Training: 8-22 mins                    G Codes:      Reginia Naas November 25, 2016, 12:41 PM  Magda Kiel, [place] 11/25/16

## 2016-11-11 NOTE — Clinical Social Work Note (Signed)
CSW confirmed bed offer with [facility] [place]. Pt and wife aware and agreeable to discharge plan. CSW requested auth through [employer]. CSW updated pt and wife of d/c tomorrow. CSW will continue to follow.   Oretha Ellis, MSW, [place] Social Worker  [phone]

## 2016-11-11 NOTE — Clinical Social Work Placement (Signed)
   CLINICAL SOCIAL WORK PLACEMENT  NOTE  Date:  11/11/2016  Patient Details  Name: Bryan Frey MRN: [id number] Date of Birth: May 14, 1946  Clinical Social Work is seeking post-discharge placement for this patient at the [place] level of care (*CSW will initial, date and re-position this form in  chart as items are completed):  Yes   Patient/family provided with [place] Work Department's list of facilities offering this level of care within the geographic area requested by the patient (or if unable, by the patient's family).  Yes   Patient/family informed of their freedom to choose among providers that offer the needed level of care, that participate in Medicare, Medicaid or managed care program needed by the patient, have an available bed and are willing to accept the patient.  Yes   Patient/family informed of [facility]'s ownership interest in [facility] and [facility], as well as of the fact that they are under no obligation to receive care at these facilities.  PASRR submitted to EDS on       PASRR number received on       Existing PASRR number confirmed on 11/10/16     FL2 transmitted to all facilities in geographic area requested by pt/family on 11/10/16     FL2 transmitted to all facilities within larger geographic area on       Patient informed that his/her managed care company has contracts with or will negotiate with certain facilities, including the following:        Yes   Patient/family informed of bed offers received.  Patient chooses bed at [facility]     Physician recommends and patient chooses bed at      Patient to be transferred to [facility] on 11/12/16.  Patient to be transferred to facility by [facility]     Patient family notified on 11/11/16 of transfer.  Name of family member notified:  Pt's wife [name]     PHYSICIAN       Additional Comment:     _______________________________________________ Truitt Merle, [place] 11/11/2016, 5:29 PM

## 2016-11-11 NOTE — Care Management Important Message (Signed)
Important Message  Patient Details  Name: Bryan Frey MRN: [id number] Date of Birth: Feb 04, 1946   Medicare Important Message Given:  Yes    [name] Montine Circle 11/11/2016, 12:57 PM

## 2016-11-11 NOTE — Discharge Summary (Signed)
Physician Discharge Summary  Patient ID: ABDULAHAD LAFARY MRN: [id number] DOB/AGE: 05-04-1946 71 y.o.  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 lumbar stenosis.  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 Lumbar stenosis. Acute blood loss anemia. Pressure injury to skin on left cheek Active Problems:   Lumbar radiculopathy, chronic   Pressure injury of skin   Discharged Condition: fair  Hospital Course: patient was admitted to undergo surgery decompress and stabilize L2-3 L3-4 and L4-5. He tolerated the surgery well. He sustained an acute blood loss anemia with a hemoglobin ascending from 14-11 he did not require transfusion. His incision has remained clean and dry. Is ambulatory with use of a cane. He is being transferred to skilled nursing facility for further inpatient rehabilitation. He did sustain a pressure sore on the left cheek from the time of surgery being prone for about 8 hours.  Consults: None  Significant Diagnostic Studies: none  Treatments: surgery: decompression L2-3 L3-4 L4-5 posterior lumbar interbody arthrodesis with peek spacers pedicle screw fixation L2-L5  Discharge Exam: Blood pressure (!) 143/70, pulse 81, temperature 99 F (37.2 C), temperature source Oral, resp. rate 18, height 5\' 11"  (1.803 m), weight 112.4 kg (247 lb 12.8 oz), SpO2 94 %. incision is clean and dry. Motor function is intact in lower extremities. Patient and gait are intact. Weakness of left lower extremity in the iliopsoas and quadriceps on the left  Disposition: 06-Home-Health Care Svc  Discharge Instructions    Call MD for:  redness, tenderness, or signs of infection (pain, swelling, redness, odor or green/yellow discharge around incision site)    Complete by:  As directed    Call MD for:  severe uncontrolled pain    Complete  by:  As directed    Call MD for:  temperature >100.4    Complete by:  As directed    Diet - low sodium heart healthy    Complete by:  As directed    Discharge instructions    Complete by:  As directed    Okay to shower. Do not apply salves or appointments to incision. No heavy lifting with the upper extremities greater than 15 pounds. May resume driving when not requiring pain medication and patient feels comfortable with doing so.   Incentive spirometry RT    Complete by:  As directed    Increase activity slowly    Complete by:  As directed    Remove dressing    Complete by:  As directed    Remove honeycomb dressing from back prior to patient being discharged     Allergies as of 11/11/2016      Reactions   Morphine And Related    Hallucinations      Medication List    STOP taking these medications   naproxen sodium 220 MG tablet Commonly known as:  ANAPROX     TAKE these medications   amLODipine 10 MG tablet Commonly known as:  NORVASC Take 10 mg by mouth daily.   carbamazepine 200 MG tablet Commonly known as:  TEGRETOL Take 400 mg by mouth 2 (two) times daily.   dexamethasone 1 MG tablet Commonly known as:  DECADRON 2 tablets twice daily for 2 days, one tablet twice daily for 2 days, one tablet daily for 2 days.   diphenhydramine-acetaminophen 25-500 MG Tabs tablet Commonly known as:  TYLENOL PM Take 1 tablet by mouth  at bedtime.   hydrochlorothiazide 25 MG tablet Commonly known as:  HYDRODIURIL Take 25 mg by mouth daily.   Melatonin 5 MG Caps Take 5 mg by mouth at bedtime.   methocarbamol 500 MG tablet Commonly known as:  ROBAXIN Take 1 tablet (500 mg total) by mouth every 6 (six) hours as needed for muscle spasms.   oxyCODONE-acetaminophen 5-325 MG tablet Commonly known as:  PERCOCET/ROXICET Take 1-2 tablets by mouth every 3 (three) hours as needed for moderate pain.   traMADol 50 MG tablet Commonly known as:  ULTRAM Take 1-2 tablets (50-100 mg  total) by mouth every 4 (four) hours as needed for moderate pain. What changed:  when to take this      Contact information for after-discharge care    Destination    [facility] .   Specialty:  [place] information: [address] [place] [phone]              Signed: Earleen Newport 11/11/2016, 6:17 PM

## 2016-11-11 NOTE — Progress Notes (Signed)
Occupational Therapy Treatment Patient Details Name: ALEXANDERJAMES IDA MRN: [id number] DOB: 08/16/1946 Today's Date: 11/11/2016    History of present illness Pt is a 71 y/o male s/p L2-L5 decompression and fusion. PMH including but not limited to L total hip revision, hx of back surgeries x2 (2011 and 2013).   OT comments  Pt progressing steadily, but slowly with OT.  He requires minA for functional transfers, and mod - max A for LB ADLs.  He is eager for discharge to SNF.   Follow Up Recommendations  SNF;Supervision/Assistance - 24 hour    Equipment Recommendations  None recommended by OT    Recommendations for Other Services      Precautions / Restrictions Precautions Precautions: Back;Fall Precaution Comments: Pt able to recall 3/3 back precautions with increased time. Required Braces or Orthoses: Spinal Brace Spinal Brace: Lumbar corset;Applied in sitting position       Mobility Bed Mobility                  Transfers Overall transfer level: Needs assistance Equipment used: Rolling walker (2 wheeled) Transfers: Sit to/from [facility] Sit to Stand: Min assist Stand pivot transfers: Min guard       General transfer comment: cues for hand placement and min A to stand     Balance Overall balance assessment: Needs assistance   Sitting balance-Leahy Scale: Fair     Standing balance support: Bilateral upper extremity supported Standing balance-Leahy Scale: Poor Standing balance comment: Requires UE support for all standing activities.                   ADL Overall ADL's : Needs assistance/impaired     Grooming: Wash/dry hands;Wash/dry face;Oral care;Min guard;Standing                   Toilet Transfer: Ambulation;Comfort height toilet;Grab bars;RW;Minimal assistance   Toileting- Water quality scientist and Hygiene: Minimal assistance;Sit to/from stand       Functional mobility during ADLs: Minimal assistance;Rolling  walker General ADL Comments: Pt able to don brace with set up assist EOB       Vision                     Perception     Praxis      Cognition   Behavior During Therapy: [facility] for tasks assessed/performed Overall Cognitive Status: Within Functional Limits for tasks assessed                       Extremity/Trunk Assessment               Exercises     Shoulder Instructions       General Comments      Pertinent Vitals/ Pain       Pain Assessment: Faces Faces Pain Scale: Hurts even more Pain Location: back Pain Descriptors / Indicators: Operative site guarding;Crying;Grimacing;Guarding Pain Intervention(s): Limited activity within patient's tolerance;Monitored during session;Repositioned  Home Living                                          Prior Functioning/Environment              Frequency  Min 2X/week        Progress Toward Goals  OT Goals(current goals can now be found in the care plan section)  Progress towards OT goals: Progressing toward goals     Plan Discharge plan remains appropriate    Co-evaluation                 End of Session Equipment Utilized During Treatment: Gait belt;Rolling walker   Activity Tolerance Patient tolerated treatment well   Patient Left in chair;with call bell/phone within reach;with family/visitor present   Nurse Communication Mobility status        Time: [id number] OT Time Calculation (min): 19 min  Charges: OT General Charges $OT Visit: 1 Procedure OT Treatments $Therapeutic Activity: 8-22 mins  Heliodoro Domagalski M 11/11/2016, 6:38 PM

## 2016-11-12 DIAGNOSIS — M62838 Other muscle spasm: Secondary | ICD-10-CM | POA: Diagnosis not present

## 2016-11-12 DIAGNOSIS — R569 Unspecified convulsions: Secondary | ICD-10-CM | POA: Diagnosis not present

## 2016-11-12 DIAGNOSIS — M6281 Muscle weakness (generalized): Secondary | ICD-10-CM | POA: Diagnosis not present

## 2016-11-12 DIAGNOSIS — M549 Dorsalgia, unspecified: Secondary | ICD-10-CM | POA: Diagnosis not present

## 2016-11-12 DIAGNOSIS — R531 Weakness: Secondary | ICD-10-CM | POA: Diagnosis not present

## 2016-11-12 DIAGNOSIS — F5101 Primary insomnia: Secondary | ICD-10-CM | POA: Diagnosis not present

## 2016-11-12 DIAGNOSIS — M48062 Spinal stenosis, lumbar region with neurogenic claudication: Secondary | ICD-10-CM | POA: Diagnosis not present

## 2016-11-12 DIAGNOSIS — I10 Essential (primary) hypertension: Secondary | ICD-10-CM | POA: Diagnosis not present

## 2016-11-12 DIAGNOSIS — Z5189 Encounter for other specified aftercare: Secondary | ICD-10-CM | POA: Diagnosis not present

## 2016-11-12 NOTE — Clinical Social Work Note (Addendum)
Pt is ready for discharge today and will go to [facility] . Pt and wife are aware and agreeable to discharge plan. CSW sent clinicals to [facility] and communicated with Broad[facility] John (admissions) for room and report. Insurance auth confirmation #: [id number] provided to [facility]. Room and report provided to RN and put in treatment team sticky note. Transportation arranged with [facility] for 11:30am pick up. CSW is signing off as no further needs identified.  Oretha Ellis, MSW, [place] Social Worker  [phone]

## 2016-11-12 NOTE — Care Management Note (Signed)
Case Management Note  Patient Details  Name: Bryan Frey MRN: [id number] Date of Birth: [birth year]  Subjective/Objective:                    Action/Plan: Pt discharging to [facility] today. No further needs per CM.   Expected Discharge Date:  11/12/16               Expected Discharge Plan:  Skilled Nursing Facility  In-House Referral:  Clinical Social Work  Discharge planning Services     Post Acute Care Choice:    Choice offered to:     DME Arranged:    DME Agency:     HH Arranged:    [place] Agency:     Status of Service:  Completed, signed off  If discussed at [employer], dates discussed:    Additional Comments:  Pollie Friar, RN 11/12/2016, 10:39 AM

## 2016-11-16 DIAGNOSIS — G8918 Other acute postprocedural pain: Secondary | ICD-10-CM | POA: Diagnosis not present

## 2016-11-16 DIAGNOSIS — G4700 Insomnia, unspecified: Secondary | ICD-10-CM | POA: Diagnosis not present

## 2016-11-16 DIAGNOSIS — M6281 Muscle weakness (generalized): Secondary | ICD-10-CM | POA: Diagnosis not present

## 2016-11-16 DIAGNOSIS — I10 Essential (primary) hypertension: Secondary | ICD-10-CM | POA: Diagnosis not present

## 2016-11-16 DIAGNOSIS — G500 Trigeminal neuralgia: Secondary | ICD-10-CM | POA: Diagnosis not present

## 2016-11-16 DIAGNOSIS — K5900 Constipation, unspecified: Secondary | ICD-10-CM | POA: Diagnosis not present

## 2016-11-24 DIAGNOSIS — I10 Essential (primary) hypertension: Secondary | ICD-10-CM | POA: Diagnosis not present

## 2016-11-24 DIAGNOSIS — Z96643 Presence of artificial hip joint, bilateral: Secondary | ICD-10-CM | POA: Diagnosis not present

## 2016-11-24 DIAGNOSIS — G500 Trigeminal neuralgia: Secondary | ICD-10-CM | POA: Diagnosis not present

## 2016-11-24 DIAGNOSIS — Z79891 Long term (current) use of opiate analgesic: Secondary | ICD-10-CM | POA: Diagnosis not present

## 2016-11-24 DIAGNOSIS — F5101 Primary insomnia: Secondary | ICD-10-CM | POA: Diagnosis not present

## 2016-11-24 DIAGNOSIS — Z981 Arthrodesis status: Secondary | ICD-10-CM | POA: Diagnosis not present

## 2016-11-24 DIAGNOSIS — Z4789 Encounter for other orthopedic aftercare: Secondary | ICD-10-CM | POA: Diagnosis not present

## 2016-11-26 DIAGNOSIS — M5136 Other intervertebral disc degeneration, lumbar region: Secondary | ICD-10-CM | POA: Diagnosis not present

## 2016-11-26 DIAGNOSIS — M2170 Unequal limb length (acquired), unspecified site: Secondary | ICD-10-CM | POA: Diagnosis not present

## 2016-11-27 DIAGNOSIS — M48062 Spinal stenosis, lumbar region with neurogenic claudication: Secondary | ICD-10-CM | POA: Diagnosis not present

## 2016-11-28 DIAGNOSIS — F5101 Primary insomnia: Secondary | ICD-10-CM | POA: Diagnosis not present

## 2016-11-28 DIAGNOSIS — Z79891 Long term (current) use of opiate analgesic: Secondary | ICD-10-CM | POA: Diagnosis not present

## 2016-11-28 DIAGNOSIS — Z96643 Presence of artificial hip joint, bilateral: Secondary | ICD-10-CM | POA: Diagnosis not present

## 2016-11-28 DIAGNOSIS — Z981 Arthrodesis status: Secondary | ICD-10-CM | POA: Diagnosis not present

## 2016-11-28 DIAGNOSIS — G500 Trigeminal neuralgia: Secondary | ICD-10-CM | POA: Diagnosis not present

## 2016-11-28 DIAGNOSIS — Z4789 Encounter for other orthopedic aftercare: Secondary | ICD-10-CM | POA: Diagnosis not present

## 2016-11-28 DIAGNOSIS — I10 Essential (primary) hypertension: Secondary | ICD-10-CM | POA: Diagnosis not present

## 2016-12-01 DIAGNOSIS — F5101 Primary insomnia: Secondary | ICD-10-CM | POA: Diagnosis not present

## 2016-12-01 DIAGNOSIS — Z981 Arthrodesis status: Secondary | ICD-10-CM | POA: Diagnosis not present

## 2016-12-01 DIAGNOSIS — Z79891 Long term (current) use of opiate analgesic: Secondary | ICD-10-CM | POA: Diagnosis not present

## 2016-12-01 DIAGNOSIS — Z96643 Presence of artificial hip joint, bilateral: Secondary | ICD-10-CM | POA: Diagnosis not present

## 2016-12-01 DIAGNOSIS — I10 Essential (primary) hypertension: Secondary | ICD-10-CM | POA: Diagnosis not present

## 2016-12-01 DIAGNOSIS — G500 Trigeminal neuralgia: Secondary | ICD-10-CM | POA: Diagnosis not present

## 2016-12-01 DIAGNOSIS — Z4789 Encounter for other orthopedic aftercare: Secondary | ICD-10-CM | POA: Diagnosis not present

## 2016-12-16 DIAGNOSIS — M2170 Unequal limb length (acquired), unspecified site: Secondary | ICD-10-CM | POA: Diagnosis not present

## 2017-02-18 ENCOUNTER — Other Ambulatory Visit: Payer: Self-pay | Admitting: *Deleted

## 2017-02-18 ENCOUNTER — Other Ambulatory Visit (HOSPITAL_COMMUNITY): Payer: Self-pay | Admitting: Interventional Radiology

## 2017-02-18 DIAGNOSIS — C642 Malignant neoplasm of left kidney, except renal pelvis: Secondary | ICD-10-CM

## 2017-02-26 DIAGNOSIS — M48062 Spinal stenosis, lumbar region with neurogenic claudication: Secondary | ICD-10-CM | POA: Diagnosis not present

## 2017-03-07 NOTE — Addendum Note (Signed)
Addendum  created 03/07/17 0901 by Carrera Kiesel, Dush[place], MD   Sign clinical note    

## 2017-03-10 ENCOUNTER — Other Ambulatory Visit
Admission: RE | Admit: 2017-03-10 | Discharge: 2017-03-10 | Disposition: A | Discharge status: Home / Self Care | Payer: PPO | Source: Ambulatory Visit | Attending: Interventional Radiology | Admitting: Interventional Radiology

## 2017-03-10 DIAGNOSIS — C642 Malignant neoplasm of left kidney, except renal pelvis: Secondary | ICD-10-CM | POA: Diagnosis not present

## 2017-03-10 LAB — CREATININE, SERUM
Creatinine, Ser: 1.16 mg/dL (ref 0.61–1.24)
GFR calc Af Amer: >60 mL/min (ref >60)

## 2017-03-10 LAB — BUN: BUN: 16 mg/dL (ref 6–20)

## 2017-03-18 ENCOUNTER — Ambulatory Visit
Admission: RE | Admit: 2017-03-18 | Discharge: 2017-03-18 | Disposition: A | Discharge status: Home / Self Care | Payer: PPO | Source: Ambulatory Visit | Attending: Interventional Radiology | Admitting: Interventional Radiology

## 2017-03-18 ENCOUNTER — Ambulatory Visit (HOSPITAL_COMMUNITY)
Admission: RE | Admit: 2017-03-18 | Discharge: 2017-03-18 | Disposition: A | Discharge status: Home / Self Care | Payer: PPO | Source: Ambulatory Visit | Attending: Interventional Radiology | Admitting: Interventional Radiology

## 2017-03-18 ENCOUNTER — Encounter (HOSPITAL_COMMUNITY): Payer: Self-pay

## 2017-03-18 DIAGNOSIS — I700 Atherosclerosis of aorta: Secondary | ICD-10-CM | POA: Diagnosis not present

## 2017-03-18 DIAGNOSIS — K8020 Calculus of gallbladder without cholecystitis without obstruction: Secondary | ICD-10-CM | POA: Insufficient documentation

## 2017-03-18 DIAGNOSIS — C642 Malignant neoplasm of left kidney, except renal pelvis: Secondary | ICD-10-CM

## 2017-03-18 DIAGNOSIS — N200 Calculus of kidney: Secondary | ICD-10-CM | POA: Diagnosis not present

## 2017-03-18 DIAGNOSIS — Z9889 Other specified postprocedural states: Secondary | ICD-10-CM | POA: Insufficient documentation

## 2017-03-18 HISTORY — PX: IR RADIOLOGIST EVAL & MGMT: IMG5224

## 2017-03-18 MED ORDER — IOPAMIDOL (ISOVUE-300) INJECTION 61%
INTRAVENOUS | Status: AC
  Filled 2017-03-18: qty 100

## 2017-03-18 MED ORDER — IOPAMIDOL (ISOVUE-300) INJECTION 61%
100.0000 mL | Freq: Once | INTRAVENOUS | Status: AC | PRN
  Administered 2017-03-18: 100 mL via INTRAVENOUS

## 2017-03-18 NOTE — Progress Notes (Signed)
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  History of Present Illness: Bryan Frey is a 71 y.o. 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 Since last follow-up, he has undergone additional lumbar decompression and fusion by Dr. Ellene Route in February for progressive left-sided lower extremity radiculopathy and weakness. He still has significant pain and weakness and is scheduled to see Dr. Jannifer Franklin soon for neurologic consultation.  Past Medical History:  Diagnosis Date  . Anxiety    with diagnosis  . 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 Depression   . Gall stones   . High cholesterol   . History of kidney stones   . Hypertension    EKG,  chest  4/13 EPIC  . 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  . Nephrolithiasis   . Neuromuscular disorder ([place])    facial nerve pain    Past Surgical History:  Procedure Laterality Date  . BACK SURGERY    . Left total hip arthroplasty  08/07/2010  . LUMBAR LAMINECTOMY/DECOMPRESSION MICRODISCECTOMY  02/09/2012   Procedure: LUMBAR LAMINECTOMY/DECOMPRESSION MICRODISCECTOMY 2 LEVELS;  Surgeon: Kristeen Miss, MD;  Location: [facility];  Service: Neurosurgery;  Laterality: Bilateral;  Bilateral Lumbar two-three,lumbar four-five laminectomies  . NEPHROLITHOTOMY Right 02/09/2014   Procedure: NEPHROLITHOTOMY PERCUTANEOUS;  Surgeon: Franchot Gallo, MD;  Location: [facility];  Service: Urology;  Laterality: Right;  . Right total hip arthroplasty  07/13/2014  . TOTAL HIP REVISION Left 05/14/2015   Procedure: TOTAL HIP REVISION;  Surgeon: Dereck Leep, MD;  Location: [facility] ORS;  Service: Orthopedics;  Laterality: Left;    Allergies: Morphine and related  Medications: Prior to Admission medications   Medication Sig Start Date End Date Taking? Authorizing Provider  amLODipine (NORVASC) 10 MG tablet Take 10 mg by mouth daily.    Yes [provider]    carbamazepine (TEGRETOL) 200 MG tablet Take 400 mg by mouth 2 (two) times daily.    Yes [provider]  hydrochlorothiazide (HYDRODIURIL) 25 MG tablet Take 25 mg by mouth daily.    Yes [provider]  Melatonin 5 MG CAPS Take 5 mg by mouth at bedtime.   Yes [provider]  oxyCODONE-acetaminophen (PERCOCET/ROXICET) 5-325 MG tablet Take 1-2 tablets by mouth every 3 (three) hours as needed for moderate pain. 11/11/16  Yes Kristeen Miss, MD  traMADol (ULTRAM) 50 MG tablet Take 1-2 tablets (50-100 mg total) by mouth every 4 (four) hours as needed for moderate pain. Patient taking differently: Take 50-100 mg by mouth 2 (two) times daily as needed for moderate pain.  05/17/15  Yes Watt Climes, PA  dexamethasone (DECADRON) 1 MG tablet 2 tablets twice daily for 2 days, one tablet twice daily for 2 days, one tablet daily for 2 days. Patient not taking: Reported on 03/18/2017 11/11/16   Kristeen Miss, MD  diphenhydramine-acetaminophen (TYLENOL PM) 25-500 MG TABS Take 1 tablet by mouth at bedtime.     [provider]  methocarbamol (ROBAXIN) 500 MG tablet Take 1 tablet (500 mg total) by mouth every 6 (six) hours as needed for muscle spasms. Patient not taking: Reported on 03/18/2017 11/11/16   Kristeen Miss, MD     Family History  Problem Relation Age of Onset  . Anesthesia problems Neg Hx     Social History   Social History  . Marital status: Married    Spouse name: N/A  .  Number of children: N/A  . Years of education: N/A   Social History Main Topics  . Smoking status: Never Smoker  . Smokeless tobacco: Never Used  . Alcohol use Yes     Comment: BEER OCC  . Drug use: No     Comment: tramadol  . Sexual activity: Not on file   Other Topics Concern  . Not on file   Social History Narrative  . No narrative on file    Review of Systems: A 12 point ROS discussed and pertinent positives are indicated in the HPI above.  All other systems are  negative.  Review of Systems  Respiratory: Negative.   Cardiovascular: Negative.   Gastrointestinal: Negative.   Genitourinary: Negative.   Musculoskeletal: Positive for gait problem.  Neurological: Positive for weakness.       Left lower extremity weakness.    Vital Signs: BP 123/60   Pulse 88   Temp 98.3 F (36.8 C) (Oral)   Resp 16   Ht 5\' 11"  (1.803 m)   Wt 247 lb (112 kg)   SpO2 96%   BMI 34.45 kg/m   Physical Exam  Constitutional: He is oriented to person, place, and time. No distress.  Abdominal: Soft. There is no tenderness.  Musculoskeletal: He exhibits no edema.  Neurological: He is alert and oriented to person, place, and time.  Skin: He is not diaphoretic.  Vitals reviewed.   Imaging: Ct Abdomen W Wo Contrast  Result Date: 03/18/2017 CLINICAL DATA:  Chronic back pain. History of left renal cryo ablation procedure in 2013. EXAM: CT ABDOMEN WITHOUT AND WITH CONTRAST TECHNIQUE: Multidetector CT imaging of the abdomen was performed following the standard protocol before and following the bolus administration of intravenous contrast. CONTRAST:  180mL ISOVUE-300 IOPAMIDOL (ISOVUE-300) INJECTION 61% COMPARISON:  CT 03/25/2016 and 03/13/2015. FINDINGS: Lower chest: Clear lung bases. No significant pleural or pericardial effusion. Coronary atherosclerosis noted. Hepatobiliary: No focal hepatic abnormality or abnormal enhancement. There is a stable calcified gallstone. No gallbladder wall thickening or biliary dilatation. Pancreas: Unremarkable. No pancreatic ductal dilatation or surrounding inflammatory changes. Spleen: Normal in size without focal abnormality. Adrenals/Urinary Tract: Both adrenal glands appear normal. Multiple small nonobstructing bilateral renal calculi are again demonstrated. There is no evidence of hydronephrosis or ureteral calculus. Cryoablation lesion in the lower pole of the left kidney appears unchanged, measuring 3.8 x 3.5 cm transverse. No associated  abnormal enhancement. Several small low-density renal lesions consistent with cysts are again noted bilaterally, mostly stable. There is 1 lesion in the lower pole of the left kidney which has slightly enlarged, measuring 9 mm on image 45 of series 10. This is too small to optimally characterize, although does not demonstrate definite enhancement. Stomach/Bowel: No evidence of bowel wall thickening, distention or surrounding inflammatory change. Vascular/Lymphatic: There are no enlarged abdominal lymph nodes. Mild aortoiliac atherosclerosis. Other: None. Musculoskeletal: No acute or significant osseous findings. Extensive spondylosis status post L2 through 5 surgical fusion. IMPRESSION: 1. Stable appearance of the cryo ablation defect in the lower pole of the left kidney. No evidence of local recurrence or metastatic disease. 2. No highly suspicious renal findings. There is a 9 mm low-density lesion in the lower pole of the left kidney which has enlarged compared with the prior study, without definite enhancement. 3. Nonobstructing bilateral renal calculi. 4. Cholelithiasis.  Aortic Atherosclerosis (ICD10-I70.0). Electronically Signed   By: Richardean Sale M.D.   On: 03/18/2017 11:23    Labs:  CBC:  Recent Labs  10/30/16  1039 11/07/16 1639 11/07/16 1746 11/08/16 0238  WBC 6.4  --   --  10.8*  HGB 14.7 9.9* 9.9* 10.3*  HCT 44.8 29.0* 29.0* 31.1*  PLT 223  --   --  185    COAGS: No results for input(s): INR, APTT in the last 8760 hours.  BMP:  Recent Labs  09/17/16 1000 10/30/16 1039 11/07/16 1639 11/07/16 1746 11/08/16 0238 03/10/17 0934  NA  --  141 140 139 140  --   K  --  4.3 4.0 4.0 4.5  --   CL  --  105  --   --  107  --   CO2  --  28  --   --  27  --   GLUCOSE  --  114* 100* 106* 139*  --   BUN 20 13  --   --  17 16  CALCIUM  --  9.3  --   --  8.0*  --   CREATININE 1.28* 1.19  --   --  1.33* 1.16  GFRNONAA 55* 60*  --   --  52* >60  GFRAA >60 >60  --   --  >60 >60     Assessment and Plan:  Renal function is stable and normal. I reviewed the follow-up CT of the abdomen performed earlier today with Mr. Lamour and his wife. This continues to demonstrate a stable avascular ablation defect at the level of the previously treated exophytic posterior left lower pole carcinoma. There is no evidence of enhancement to suggest recurrent tumor. No new renal lesions are identified. There are stable bilateral nonobstructing renal calculi.  Given 5 years of stability after ablation, I have recommended that we discontinue routine follow-up imaging of the abdomen.  Electronically SignedAletta Edouard T 03/18/2017, 2:10 PM   I spent a total of 15 Minutes in face to face in clinical consultation, greater than 50% of which was counseling/coordinating care post cryoablation of a left renal carcinoma.

## 2017-03-23 ENCOUNTER — Encounter: Payer: Self-pay | Admitting: Interventional Radiology

## 2017-03-26 DIAGNOSIS — H2511 Age-related nuclear cataract, right eye: Secondary | ICD-10-CM | POA: Diagnosis not present

## 2017-03-27 ENCOUNTER — Ambulatory Visit (INDEPENDENT_AMBULATORY_CARE_PROVIDER_SITE_OTHER): Payer: PPO | Admitting: Neurology

## 2017-03-27 ENCOUNTER — Encounter: Payer: Self-pay | Admitting: Neurology

## 2017-03-27 VITALS — BP 143/85 | HR 81 | Ht 71.0 in | Wt 237.5 lb

## 2017-03-27 DIAGNOSIS — E538 Deficiency of other specified B group vitamins: Secondary | ICD-10-CM | POA: Diagnosis not present

## 2017-03-27 DIAGNOSIS — Z5181 Encounter for therapeutic drug level monitoring: Secondary | ICD-10-CM | POA: Diagnosis not present

## 2017-03-27 DIAGNOSIS — M5416 Radiculopathy, lumbar region: Secondary | ICD-10-CM

## 2017-03-27 DIAGNOSIS — R269 Unspecified abnormalities of gait and mobility: Secondary | ICD-10-CM | POA: Diagnosis not present

## 2017-03-27 HISTORY — DX: Unspecified abnormalities of gait and mobility: R26.9

## 2017-03-27 NOTE — Progress Notes (Signed)
Reason for visit: Gait disorder  Referring physician: Dr. Dierdre Harness III is a 71 y.o. male  History of present illness:  Bryan Frey is a 71 year old right-handed white male with a history of a gait disorder that has been gradually progressively worsening over the last 10 years or more. The patient had bilateral total hip replacements about 10 years ago, but the ability to walk has continued to worsen. The patient was noted to have multilevel spinal stenosis and he underwent lumbosacral spine surgery from the L2-L5 level on 11/07/16. The patient has noted improvement in discomfort in the left hip and left thigh area following the surgery, but he believes that his walking problem has not improved, but in fact has actually continued to worsen. The patient has fallen on occasion. He has difficulty getting up out of a chair or walking upstairs. He uses a [place] crutch for ambulation. The patient does have some pain across the back at this point, but he denies pain down the legs. He apparently was evaluated for possible multiple sclerosis 10 years ago secondary to a left internuclear ophthalmoplegia, he also has right V2 distribution trigeminal neuralgia and episodes of right-sided numbness that may occur. The patient was seen by Dr. [name] at that time, but the diagnosis of multiple sclerosis was never confirmed. The patient does report some difficulty voiding the bladder, he usually does not have incontinence. He denies any troubles controlling the bowels. He denies any numbness in the legs per se, he indicates that the use of the upper extremities is full and normal. He is on carbamazepine for the trigeminal neuralgia with good improvement. He is sent to this office for further evaluation. He apparently had EMG and nerve conduction studies done at [facility] on 03/17/2013, this study appears to have been normal.  Past Medical History:  Diagnosis Date  . Anxiety    with diagnosis  .  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 Depression   . Gall stones   . High cholesterol   . History of kidney stones   . Hypertension    EKG,  chest  4/13 EPIC  . 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  . Nephrolithiasis   . Neuromuscular disorder ([place])    facial nerve pain    Past Surgical History:  Procedure Laterality Date  . BACK SURGERY    . IR RADIOLOGIST EVAL & MGMT  03/18/2017  . Left total hip arthroplasty  08/07/2010  . LUMBAR LAMINECTOMY/DECOMPRESSION MICRODISCECTOMY  02/09/2012   Procedure: LUMBAR LAMINECTOMY/DECOMPRESSION MICRODISCECTOMY 2 LEVELS;  Surgeon: Kristeen Miss, MD;  Location: [place] [facility] ORS;  Service: Neurosurgery;  Laterality: Bilateral;  Bilateral Lumbar two-three,lumbar four-five laminectomies  . NEPHROLITHOTOMY Right 02/09/2014   Procedure: NEPHROLITHOTOMY PERCUTANEOUS;  Surgeon: Franchot Gallo, MD;  Location: [facility] ORS;  Service: Urology;  Laterality: Right;  . Right total hip arthroplasty  07/13/2014  . TOTAL HIP REVISION Left 05/14/2015   Procedure: TOTAL HIP REVISION;  Surgeon: Dereck Leep, MD;  Location: [facility] ORS;  Service: Orthopedics;  Laterality: Left;    Family History  Problem Relation Age of Onset  . Anesthesia problems Neg Hx     Social history:  reports that he has never smoked. He has never used smokeless tobacco. He reports that he drinks alcohol. He reports that he does not use drugs.  Medications:  Prior to Admission medications   Medication Sig Start Date End Date Taking? Authorizing Provider  amLODipine (  NORVASC) 10 MG tablet Take 10 mg by mouth daily.    Yes [provider]  carbamazepine (TEGRETOL) 200 MG tablet Take 400 mg by mouth 2 (two) times daily.    Yes [provider]  diphenhydramine-acetaminophen (TYLENOL PM) 25-500 MG TABS Take 1 tablet by mouth at bedtime.    Yes [provider]  hydrochlorothiazide (HYDRODIURIL) 25 MG tablet Take 25 mg by mouth daily.    Yes [provider]  Melatonin 5 MG  CAPS Take 5 mg by mouth at bedtime.   Yes [provider]  oxyCODONE-acetaminophen (PERCOCET/ROXICET) 5-325 MG tablet Take 1-2 tablets by mouth every 3 (three) hours as needed for moderate pain. 11/11/16  Yes Kristeen Miss, MD  traMADol (ULTRAM) 50 MG tablet Take 1-2 tablets (50-100 mg total) by mouth every 4 (four) hours as needed for moderate pain. Patient taking differently: Take 50-100 mg by mouth 2 (two) times daily as needed for moderate pain.  05/17/15  Yes Watt Climes, PA      Allergies  Allergen Reactions  . Morphine And Related     Hallucinations    ROS:  Out of a complete 14 system review of symptoms, the patient complains only of the following symptoms, and all other reviewed systems are negative.  Fatigue Blurred vision Ringing in the ears Achy muscles  Blood pressure (!) 143/85, pulse 81, height 5\' 11"  (1.803 m), weight 237 lb 8 oz (107.7 kg).  Physical Exam  General: The patient is alert and cooperative at the time of the examination.  Eyes: Pupils are equal, round, and reactive to light. Discs are flat bilaterally.  Neck: The neck is supple, no carotid bruits are noted.  Respiratory: The respiratory examination is clear.  Cardiovascular: The cardiovascular examination reveals a regular rate and rhythm, no obvious murmurs or rubs are noted.  Skin: Extremities are without significant edema.  Neurologic Exam  Mental status: The patient is alert and oriented x 3 at the time of the examination. The patient has apparent normal recent and remote memory, with an apparently normal attention span and concentration ability.  Cranial nerves: Facial symmetry is present. There is good sensation of the face to pinprick and soft touch bilaterally. The strength of the facial muscles and the muscles to head turning and shoulder shrug are normal bilaterally. Speech is well enunciated, no aphasia or dysarthria is noted. Extraocular movements are full, with exception of  incomplete adduction of the left eye with rightward gaze.[place] fields are full. The tongue is midline, and the patient has symmetric elevation of the soft palate. No obvious hearing deficits are noted.  Motor: The motor testing reveals 5 over 5 strength of all 4 extremities, with exception of 4/5 strength with hip flexion and abduction of the hips bilaterally. Good symmetric motor tone is noted throughout. No evidence of muscular atrophy is seen in the lower extremities.  Sensory: Sensory testing is intact to pinprick, soft touch, vibration sensation, and position sense on all 4 extremities, with exception of some decrease in pinprick sensation on the left arm as compared to the right. No evidence of extinction is noted.  Coordination: Cerebellar testing reveals good finger-nose-finger and heel-to-shin bilaterally.  Gait and station: Gait is wide-based, the patient has an almost waddling type gait, is left foot is rotated inward slightly. Romberg is negative. No drift is seen.  Reflexes: Deep tendon reflexes are symmetric and normal bilaterally. Toes are upgoing bilaterally.   MRI lumbar 09/17/16:  IMPRESSION: 1. At  L3-4 there is a broad-based disc osteophyte complex. Severe right facet arthropathy. Partial left facetectomy. Mild left foraminal narrowing. Enhancing soft tissue adjacent to the left facetectomy and left neural foramen consistent with fibrosis. Small 4 mm nonenhancing rounded area adjacent to the left lateral aspect of the disc which may reflect a small disc fragment or small bone fragment. Moderate spinal stenosis. 2. At L2-3 there is a broad based disc osteophyte complex. Severe bilateral facet arthropathy. Severe spinal stenosis. Severe bilateral foraminal stenosis. 3. On the sagittal images, at T12-L1 there is a 14 mm left this at intraspinal synovial cyst.  * MRI scan images were reviewed online. I agree with the written report.    Assessment/Plan:  1.  Progressive gait disorder  2. Lumbosacral spinal stenosis, status post surgical decompression  3. Bilateral total hip replacements  4. Left internuclear ophthalmoplegia, right trigeminal neuralgia  The patient has been evaluated in the past for demyelinating disease. The patient likely has a multifactorial gait disorder associated with the bilateral total hip replacements and the lumbosacral spine disease. The patient continues to progress with weakness, however. The weakness appears to be only present in the lower extremities. The patient appears to have bilateral Babinski's on clinical examination. He will undergo MRI evaluation of the cervical and thoracic spine to evaluate for any compressive lesions or intrinsic spinal cord disease. Blood work will be done today. He will follow-up in 4 or 5 months.  Jill Alexanders MD 03/27/2017 10:45 AM  [facility] [address] [place], [place] [address]  Phone [phone] Fax [phone]

## 2017-03-27 NOTE — Patient Instructions (Signed)
   We will get blood work today and get MRI of the neck and mid-back.

## 2017-04-03 LAB — COMPREHENSIVE METABOLIC PANEL
A/G RATIO: 1.7 (ref 1.2–2.2)
ALBUMIN: 4.5 g/dL (ref 3.5–4.8)
ALT: 17 IU/L (ref 0–44)
AST: 19 IU/L (ref 0–40)
Alkaline Phosphatase: 158 IU/L — ABNORMAL HIGH (ref 39–117)
BUN / CREAT RATIO: 11 (ref 10–24)
BUN: 13 mg/dL (ref 8–27)
CHLORIDE: 101 mmol/L (ref 96–106)
CO2: 26 mmol/L (ref 20–29)
CREATININE: 1.2 mg/dL (ref 0.76–1.27)
Calcium: 9.3 mg/dL (ref 8.6–10.2)
GFR calc Af Amer: 69 mL/min/{1.73_m2} (ref >59)
GFR calc non Af Amer: 60 mL/min/{1.73_m2} (ref >59)
GLOBULIN, TOTAL: 2.7 g/dL (ref 1.5–4.5)
Glucose: 109 mg/dL — ABNORMAL HIGH (ref 65–99)
POTASSIUM: 4.7 mmol/L (ref 3.5–5.2)
SODIUM: 143 mmol/L (ref 134–144)
Total Protein: 7.2 g/dL (ref 6.0–8.5)

## 2017-04-03 LAB — VITAMIN B12: Vitamin B-12: 340 pg/mL (ref 232–1245)

## 2017-04-03 LAB — VGCC ANTIBODY: VGCC ANTIBODY: NEGATIVE

## 2017-04-03 LAB — SEDIMENTATION RATE: SED RATE: 5 mm/h (ref 0–30)

## 2017-04-03 LAB — ACETYLCHOLINE RECEPTOR, BINDING: AChR Binding Ab, Serum: <0.03 nmol/L (ref 0.00–0.24)

## 2017-04-03 LAB — HTLV I+II ANTIBODIES, (EIA), BLD: HTLV I/II AB: NEGATIVE

## 2017-04-03 LAB — CK: Total CK: 128 U/L (ref 24–204)

## 2017-04-03 LAB — ANA W/REFLEX: ANA: NEGATIVE

## 2017-04-03 LAB — RPR: RPR Ser Ql: NONREACTIVE

## 2017-04-12 ENCOUNTER — Ambulatory Visit
Admission: RE | Admit: 2017-04-12 | Discharge: 2017-04-12 | Disposition: A | Discharge status: Home / Self Care | Payer: PPO | Source: Ambulatory Visit | Attending: Neurology | Admitting: Neurology

## 2017-04-12 DIAGNOSIS — R269 Unspecified abnormalities of gait and mobility: Secondary | ICD-10-CM | POA: Diagnosis not present

## 2017-04-12 DIAGNOSIS — M4802 Spinal stenosis, cervical region: Secondary | ICD-10-CM | POA: Diagnosis not present

## 2017-04-12 MED ORDER — GADOBENATE DIMEGLUMINE 529 MG/ML IV SOLN
20.0000 mL | Freq: Once | INTRAVENOUS | Status: AC | PRN
  Administered 2017-04-12: 20 mL via INTRAVENOUS

## 2017-04-13 ENCOUNTER — Telehealth: Payer: Self-pay | Admitting: Neurology

## 2017-04-13 DIAGNOSIS — R269 Unspecified abnormalities of gait and mobility: Secondary | ICD-10-CM

## 2017-04-13 NOTE — Telephone Encounter (Signed)
I called patient. The above studies do not show evidence of spinal cord abnormalities that would affect the walking. I will send the patient back for MRI of the brain. I discussed this with him.  Work done previously was unremarkable.   MRI cervical 04/13/17:  IMPRESSION:  This MRI of the cervical spine with and without contrast shows the following: 1.    At C2-C3, there is moderately severe left foraminal narrowing due to disc bulging and facet hypertrophy that could lead to left C3 nerve root compression. 2.    At C3-C4, there is mild spinal stenosis and severe left foraminal narrowing and moderately severe right foraminal narrowing. There is potential for compression of either of the C4 nerve roots. 3.    At C4-C5, there is mild to moderate spinal stenosis and mild bilateral foraminal narrowing. There does not appear to be any nerve root compression. 4.    At C5-C6, there is moderately severe spinal stenosis and moderately severe bilateral foraminal narrowing that could lead to compression of either of the C6 nerve roots. 5.    At C6-C7, there is moderate spinal stenosis and moderately severe left foraminal narrowing with potential for left C7 nerve root compression. 6.    There is a normal enhancement pattern.   MRI thoracic 04/13/17:  IMPRESSION:  This MRI of the thoracic spine with and without contrast shows the following: 1.    The spinal cord appears normal. There is a normal enhancement pattern. 2.    There are disc bulges/protrusions at T2-T3, T3-T4 and T8-T9 that do not lead to any nerve root compression. 3.    There are large anterior bridging osteophytes at T11-T12 and T12-L1 fusing the vertebral bodies on the right.   Smaller anterior bridging osteophytes are noted at other levels.

## 2017-04-26 ENCOUNTER — Ambulatory Visit
Admission: RE | Admit: 2017-04-26 | Discharge: 2017-04-26 | Disposition: A | Discharge status: Home / Self Care | Payer: PPO | Source: Ambulatory Visit | Attending: Neurology | Admitting: Neurology

## 2017-04-26 ENCOUNTER — Telehealth: Payer: Self-pay | Admitting: Neurology

## 2017-04-26 DIAGNOSIS — R29898 Other symptoms and signs involving the musculoskeletal system: Secondary | ICD-10-CM

## 2017-04-26 DIAGNOSIS — R2689 Other abnormalities of gait and mobility: Secondary | ICD-10-CM | POA: Diagnosis not present

## 2017-04-26 DIAGNOSIS — R269 Unspecified abnormalities of gait and mobility: Secondary | ICD-10-CM

## 2017-04-26 NOTE — Telephone Encounter (Signed)
I called the patient. He has mild to moderate SVD changes, nothing that would cause a significant gait disorder. We will check EMG and NCV evaluation.   MRI brain 04/24/17:  IMPRESSION:  This MRI of the brain without contrast shows the following: 1.    T2/FLAIR hyperintense changes in the hemispheres consistent with chronic microvascular ischemic change, mildly progressive compared to the 2013 MRI. 2.    Mild to moderate cortical atrophy, mildly progressed compared to the 2013 MRI. 3.    There are no acute findings.

## 2017-05-20 ENCOUNTER — Ambulatory Visit (INDEPENDENT_AMBULATORY_CARE_PROVIDER_SITE_OTHER): Payer: PPO | Admitting: Neurology

## 2017-05-20 ENCOUNTER — Encounter: Payer: Self-pay | Admitting: Neurology

## 2017-05-20 ENCOUNTER — Ambulatory Visit (INDEPENDENT_AMBULATORY_CARE_PROVIDER_SITE_OTHER): Payer: Self-pay | Admitting: Neurology

## 2017-05-20 DIAGNOSIS — R29898 Other symptoms and signs involving the musculoskeletal system: Secondary | ICD-10-CM

## 2017-05-20 DIAGNOSIS — R269 Unspecified abnormalities of gait and mobility: Secondary | ICD-10-CM

## 2017-05-20 NOTE — Progress Notes (Signed)
Please refer to EMG and nerve conduction study procedure note. 

## 2017-05-20 NOTE — Progress Notes (Signed)
   The patient returns today for EMG and nerve conduction study evaluation. The study shows some evidence of mild chronic denervation the L5 and S1 nerve root distribution on the right leg, a relatively unremarkable EMG is noted on the left. The patient has had a thorough evaluation with blood work, and MRI evaluation of the brain, cervical spine, and thoracic spine that did not show any evidence of spinal cord or central nervous system lesions that would explain the gait disorder and the right sided Babinski noted on clinical examination.  In the past, the patient has gained benefit with physical therapy but only transiently, I recommended that he get into a regular workout program and possibly make use of a personal trainer. He will follow-up in October 2018.    [place]    Nerve / Sites Muscle Latency Ref. Amplitude Ref. Rel Amp Segments Distance Velocity Ref. Area    ms ms mV mV %  cm m/s m/s mVms  L Peroneal - EDB     Ankle EDB 6.0 ?6.5 1.9 ?2.0 100 Ankle - EDB 9   3.5     Fib head EDB 14.4  1.7  88.3 Fib head - Ankle 42 50 ?44 4.4     Pop fossa EDB 16.1  1.4  86.2 Pop fossa - Fib head 10 58 ?44 3.7         Pop fossa - Ankle      R Peroneal - EDB     Ankle EDB 4.7 ?6.5 1.1 ?2.0 100 Ankle - EDB 9   3.0     Fib head EDB 12.6  1.1  96.9 Fib head - Ankle 39 50 ?44 3.2     Pop fossa EDB 14.7  0.8  77.3 Pop fossa - Fib head 10 46 ?44 2.5         Pop fossa - Ankle      L Tibial - AH     Ankle AH 4.0 ?5.8 4.7 ?4.0 100 Ankle - AH 9   9.9     Pop fossa AH 14.5  2.8  60.1 Pop fossa - Ankle 48 46 ?41 7.5  R Tibial - AH     Ankle AH 5.0 ?5.8 4.6 ?4.0 100 Ankle - AH 9   11.4     Pop fossa AH 16.1  2.7  57.9 Pop fossa - Ankle 48 43 ?41 6.4             SNC    Nerve / Sites Rec. Site Peak Lat Ref.  Amp Ref. Segments Distance    ms ms V V  cm  R Superficial peroneal - Ankle     Lat leg Ankle 2.7 ?4.4 14 ?6 Lat leg - Ankle 14  L Superficial peroneal - Ankle     Lat leg Ankle 2.7 ?4.4 26 ?6 Lat leg  - Ankle 14         H Reflex    Nerve H Lat Lat Hmax   ms ms   Left Right Ref. Left Right Ref.  Tibial - [place] NR 36.6 ?35.0 NR 38.9 ?35.0

## 2017-05-20 NOTE — Procedures (Signed)
     HISTORY:  Bryan Frey is a 71 year old gentleman with a history of a progressive gait disorder. The patient has had lumbosacral spine surgery previously, he has had bilateral total hip replacements. He believes that his ability to walk has gradually worsened over time. The patient is being evaluated for possible a neuropathy or a lumbosacral radiculopathy to explain his progressive gait change.  NERVE CONDUCTION STUDIES:  Nerve conduction studies were performed on both lower extremities. The distal motor latencies for the peroneal and posterior tibial nerves were within normal limits bilaterally. There is evidence of low motor amplitudes for the peroneal nerves bilaterally, normal for the posterior tibial nerves bilaterally. The nerve conduction velocities for the peroneal and posterior tibial nerves are normal bilaterally. The sensory latency for the right peroneal nerve was normal. The H reflex latencies were absent on the left and prolonged on the right.  EMG STUDIES:  EMG study was performed on the left lower extremity:  The tibialis anterior muscle reveals 2 to 4K motor units with full recruitment. No fibrillations or positive waves were seen. The peroneus tertius muscle reveals 2 to 4K motor units with full recruitment. No fibrillations or positive waves were seen. The medial gastrocnemius muscle reveals 1 to 3K motor units with full recruitment. No fibrillations or positive waves were seen. The vastus lateralis muscle reveals 2 to 4K motor units with full recruitment. No fibrillations or positive waves were seen. The iliopsoas muscle reveals 2 to 4K motor units with full recruitment. No fibrillations or positive waves were seen. The biceps femoris muscle (long head) reveals 2 to 4K motor units with full recruitment. No fibrillations or positive waves were seen. The lumbosacral paraspinal muscles were tested at 3 levels, and revealed no abnormalities of insertional activity at all 3  levels tested. There was good relaxation.   EMG study was performed on the right lower extremity:  The tibialis anterior muscle reveals 2 to 5K motor units with decreased recruitment. No fibrillations or positive waves were seen. The peroneus tertius muscle reveals 2 to 6K motor units with decreased recruitment. No fibrillations or positive waves were seen. The medial gastrocnemius muscle reveals 1 to 4K motor units with decreased recruitment. No fibrillations or positive waves were seen. The vastus lateralis muscle reveals 2 to 4K motor units with full recruitment. No fibrillations or positive waves were seen. The iliopsoas muscle reveals 2 to 4K motor units with full recruitment. No fibrillations or positive waves were seen. The biceps femoris muscle (long head) reveals 2 to 4K motor units with full recruitment. No fibrillations or positive waves were seen. The lumbosacral paraspinal muscles were tested at 3 levels, and revealed no abnormalities of insertional activity at all 3 levels tested. There was good relaxation.   IMPRESSION:  Nerve conduction studies done on both lower extremities shows evidence of a primarily motor involvement of the peroneal nerves bilaterally with sensory sparing. EMG evaluation of the left lower extremity was relatively unremarkable, EMG on the right lower extremity shows evidence of chronic mild stable denervation in the L5 and S1 nerve root distributions. No evidence of active denervation was seen, there is no evidence of a myopathic disorder.  Jill Alexanders MD 05/20/2017 4:48 PM  [facility] [address] [place], [place] [address]  Phone [phone] Fax [phone]

## 2017-06-04 DIAGNOSIS — I10 Essential (primary) hypertension: Secondary | ICD-10-CM | POA: Diagnosis not present

## 2017-06-04 DIAGNOSIS — Z6832 Body mass index (BMI) 32.0-32.9, adult: Secondary | ICD-10-CM | POA: Diagnosis not present

## 2017-06-04 DIAGNOSIS — M48062 Spinal stenosis, lumbar region with neurogenic claudication: Secondary | ICD-10-CM | POA: Diagnosis not present

## 2017-06-12 DIAGNOSIS — N5201 Erectile dysfunction due to arterial insufficiency: Secondary | ICD-10-CM | POA: Diagnosis not present

## 2017-06-12 DIAGNOSIS — N200 Calculus of kidney: Secondary | ICD-10-CM | POA: Diagnosis not present

## 2017-06-12 DIAGNOSIS — C642 Malignant neoplasm of left kidney, except renal pelvis: Secondary | ICD-10-CM | POA: Diagnosis not present

## 2017-06-26 ENCOUNTER — Other Ambulatory Visit: Payer: Self-pay | Admitting: Neurological Surgery

## 2017-06-26 DIAGNOSIS — Z0000 Encounter for general adult medical examination without abnormal findings: Secondary | ICD-10-CM | POA: Diagnosis not present

## 2017-06-26 DIAGNOSIS — M48062 Spinal stenosis, lumbar region with neurogenic claudication: Secondary | ICD-10-CM

## 2017-06-26 DIAGNOSIS — Z23 Encounter for immunization: Secondary | ICD-10-CM | POA: Diagnosis not present

## 2017-07-10 ENCOUNTER — Ambulatory Visit
Admission: RE | Admit: 2017-07-10 | Discharge: 2017-07-10 | Disposition: A | Discharge status: Home / Self Care | Payer: PPO | Source: Ambulatory Visit | Attending: Neurological Surgery | Admitting: Neurological Surgery

## 2017-07-10 DIAGNOSIS — M48062 Spinal stenosis, lumbar region with neurogenic claudication: Secondary | ICD-10-CM

## 2017-07-10 DIAGNOSIS — M48061 Spinal stenosis, lumbar region without neurogenic claudication: Secondary | ICD-10-CM | POA: Diagnosis not present

## 2017-07-10 MED ORDER — GADOBENATE DIMEGLUMINE 529 MG/ML IV SOLN
20.0000 mL | Freq: Once | INTRAVENOUS | Status: AC | PRN
  Administered 2017-07-10: 20 mL via INTRAVENOUS

## 2017-07-15 DIAGNOSIS — Z6833 Body mass index (BMI) 33.0-33.9, adult: Secondary | ICD-10-CM | POA: Diagnosis not present

## 2017-07-15 DIAGNOSIS — I10 Essential (primary) hypertension: Secondary | ICD-10-CM | POA: Diagnosis not present

## 2017-07-15 DIAGNOSIS — M48062 Spinal stenosis, lumbar region with neurogenic claudication: Secondary | ICD-10-CM | POA: Diagnosis not present

## 2017-07-22 ENCOUNTER — Telehealth: Payer: Self-pay | Admitting: Neurology

## 2017-07-22 NOTE — Telephone Encounter (Signed)
Pt said last time he saw Dr Jannifer Franklin 8/15 he said there was nothing that else that could be done. Pt has appt on 10/23 and thinks this should be c/a. Pt also notes he is still in pain, symptoms have not changed. He needs clarification if he should keep this appt. Please call to discuss

## 2017-07-22 NOTE — Telephone Encounter (Signed)
The cause of the progressive weakness of the legs and walking problems is not clear. The patient has had an extensive workup, he just recently had MRI of the lumbar spine through Dr. Ellene Route.  I will be happy to take another look to try to find out why his walking has changed.  He has had MRI the brain, cervical spine, thoracic spine, lumbar spine, EMG and nerve conduction study evaluation, and blood work done.  MRI lumbar 07/10/17:  IMPRESSION: 1. Postoperative fluid collection in the laminectomy space from L2-L3 to L4-L5. This has some atypical features for postoperative seroma, including rim enhancement and mild mass effect on the posterior thecal sac (maximal at L3-L4). Abscess would be difficult to exclude. The relatively small volume of fluid (estimated at 40 mL) and lack of extension to the subcutaneous space argues against CSF leak. 2. Stable to improved thecal sac patency L2-L3 through L4-L5. Improved neural foraminal patency at L3-L4 and L4-L5. 3. 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 4. Stable L5-S1 level with little disc degeneration but severe chronic facet degeneration. Stable associated L5 foraminal stenosis greater on the left.

## 2017-07-28 ENCOUNTER — Ambulatory Visit (INDEPENDENT_AMBULATORY_CARE_PROVIDER_SITE_OTHER): Payer: PPO | Admitting: Neurology

## 2017-07-28 ENCOUNTER — Encounter: Payer: Self-pay | Admitting: Neurology

## 2017-07-28 VITALS — BP 145/96 | HR 81 | Ht 71.0 in | Wt 239.0 lb

## 2017-07-28 DIAGNOSIS — M5416 Radiculopathy, lumbar region: Secondary | ICD-10-CM | POA: Diagnosis not present

## 2017-07-28 DIAGNOSIS — R269 Unspecified abnormalities of gait and mobility: Secondary | ICD-10-CM | POA: Diagnosis not present

## 2017-07-28 NOTE — Progress Notes (Signed)
Reason for visit: Gait disorder  Bryan Frey is an 71 y.o. male  History of present illness:  Bryan Frey is a 71 year old right-handed white male with a history of chronic low back pain and leg discomfort associated with weightbearing. He has had prior lumbosacral spine surgery, he has cervical spondylosis without spinal cord compression. 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 He has had an extensive workup that has included MRI evaluation of the entire neuro axis including the brain, cervical spine, thoracic spine, and lumbar spine. The patient walks with a Canadian crutch, he has stumbled or fallen on occasion. He does have some urgency of the bladder, he may have incontinence if he cannot get to the bathroom in time. The reports no discomfort with sitting or lying down, but he has significant pain across the low back and into the anterior thighs at times with weightbearing. He tries to exercise on a regular basis, he has access to a swimming pool. He returns this office for an evaluation. EMG and nerve conduction study have been done previously showing evidence of chronic L5 and S1 radiculopathy on the right leg.  Past Medical History:  Diagnosis Date  . Anxiety    with diagnosis  . 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 Depression   . Gait abnormality 03/27/2017  . Gall stones   . High cholesterol   . History of kidney stones   . Hypertension    EKG,  chest  4/13 EPIC  . 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  . Nephrolithiasis   . Neuromuscular disorder ([place])    facial nerve pain    Past Surgical History:  Procedure Laterality Date  . BACK SURGERY    . IR RADIOLOGIST EVAL & MGMT  03/18/2017  . Left total hip arthroplasty  08/07/2010  . LUMBAR LAMINECTOMY/DECOMPRESSION MICRODISCECTOMY  02/09/2012   Procedure: LUMBAR LAMINECTOMY/DECOMPRESSION MICRODISCECTOMY 2 LEVELS;  Surgeon: Kristeen Miss, MD;  Location:  [facility];  Service: Neurosurgery;  Laterality: Bilateral;  Bilateral Lumbar two-three,lumbar four-five laminectomies  . NEPHROLITHOTOMY Right 02/09/2014   Procedure: NEPHROLITHOTOMY PERCUTANEOUS;  Surgeon: Franchot Gallo, MD;  Location: [facility];  Service: Urology;  Laterality: Right;  . Right total hip arthroplasty  07/13/2014  . TOTAL HIP REVISION Left 05/14/2015   Procedure: TOTAL HIP REVISION;  Surgeon: Dereck Leep, MD;  Location: [facility];  Service: Orthopedics;  Laterality: Left;    Family History  Problem Relation Age of Onset  . Anesthesia problems Neg Hx     Social history:  reports that he has never smoked. He has never used smokeless tobacco. He reports that he drinks alcohol. He reports that he does not use drugs.    Allergies  Allergen Reactions  . Morphine And Related     Hallucinations    Medications:  Prior to Admission medications   Medication Sig Start Date End Date Taking? Authorizing Provider  amLODipine (NORVASC) 10 MG tablet Take 10 mg by mouth daily.    Yes [provider]  carbamazepine (TEGRETOL) 200 MG tablet Take 400 mg by mouth 2 (two) times daily.    Yes [provider]  diphenhydramine-acetaminophen (TYLENOL PM) 25-500 MG TABS Take 1 tablet by mouth at bedtime.    Yes [provider]  hydrochlorothiazide (HYDRODIURIL) 25 MG tablet Take 25 mg by mouth daily.    Yes [provider]  Melatonin 5 MG CAPS Take 5 mg by mouth at bedtime.   Yes [provider]  traMADol (ULTRAM) 50 MG tablet Take 1-2 tablets (50-100 mg total) by mouth every 4 (four) hours as needed for moderate pain. Patient taking differently: Take 50-100 mg by mouth 2 (two) times daily as needed for moderate pain.  05/17/15  Yes Watt Climes, PA    ROS:  Out of a complete 14 system review of symptoms, the patient complains only of the following symptoms, and all other reviewed systems are negative.  Frequency of urination Joint pain, back  pain, achy muscles, walking difficulty Weakness  Blood pressure (!) 145/96, pulse 81, height 5\' 11"  (1.803 m), weight 239 lb (108.4 kg).  Physical Exam  General: The patient is alert and cooperative at the time of the examination.  Skin: No significant peripheral edema is noted.   Neurologic Exam  Mental status: The patient is alert and oriented x 3 at the time of the examination. The patient has apparent normal recent and remote memory, with an apparently normal attention span and concentration ability.   Cranial nerves: Facial symmetry is present. Speech is normal, no aphasia or dysarthria is noted. Extraocular movements are full. Visual fields are full.  Motor: The patient has good strength in all 4 extremities, with exception of slight hip flexion weakness with the left leg.  Sensory examination: Soft touch sensation is symmetric on the face, arms, and legs.  Coordination: The patient has good finger-nose-finger and heel-to-shin bilaterally.  Gait and station: The patient has a wide-based gait, the left foot is in toward slightly. Tandem gait was not attempted. Romberg is negative.  Reflexes: Deep tendon reflexes are symmetric, but are depressed in the legs.   Assessment/Plan:  1. Chronic gait disorder  2. Chronic low back pain  The patient likely has a multifactorial gait disorder. He has evidence of a moderate level small vessel disease by MRI, he has no evidence of a myelopathy by MRI of the cervical or thoracic spine. He has had extensive surgery of the lumbar spine with chronic back pain and mild radiculopathy findings by EMG. He has had bilateral total hip replacements that also affected mobility at the hip level and may impact balance. I do not believe his gait disorder is treatable. Blood work done did not show any etiology for balance issues. The patient is to continue his exercise program, he is taking Ultram for pain, and the future he may consider a spinal  stimulator or epidural injections for the low back pain. He claims that he has been on Lyrica previously without benefit.   Jill Alexanders MD 07/28/2017 11:41 AM  [facility] [address] [place], [place] [address]  Phone [phone] Fax [phone]

## 2017-10-08 DIAGNOSIS — E7849 Other hyperlipidemia: Secondary | ICD-10-CM | POA: Diagnosis not present

## 2017-10-08 DIAGNOSIS — Z125 Encounter for screening for malignant neoplasm of prostate: Secondary | ICD-10-CM | POA: Diagnosis not present

## 2017-10-08 DIAGNOSIS — Z0000 Encounter for general adult medical examination without abnormal findings: Secondary | ICD-10-CM | POA: Diagnosis not present

## 2017-10-15 DIAGNOSIS — E7849 Other hyperlipidemia: Secondary | ICD-10-CM | POA: Diagnosis not present

## 2017-10-15 DIAGNOSIS — G500 Trigeminal neuralgia: Secondary | ICD-10-CM | POA: Diagnosis not present

## 2017-10-15 DIAGNOSIS — Z0000 Encounter for general adult medical examination without abnormal findings: Secondary | ICD-10-CM | POA: Diagnosis not present

## 2017-11-12 DIAGNOSIS — Z6833 Body mass index (BMI) 33.0-33.9, adult: Secondary | ICD-10-CM | POA: Diagnosis not present

## 2017-11-12 DIAGNOSIS — M48062 Spinal stenosis, lumbar region with neurogenic claudication: Secondary | ICD-10-CM | POA: Diagnosis not present

## 2017-11-12 DIAGNOSIS — I10 Essential (primary) hypertension: Secondary | ICD-10-CM | POA: Diagnosis not present

## 2017-12-08 DIAGNOSIS — H2513 Age-related nuclear cataract, bilateral: Secondary | ICD-10-CM | POA: Diagnosis not present

## 2017-12-09 ENCOUNTER — Other Ambulatory Visit: Payer: Self-pay

## 2017-12-09 ENCOUNTER — Ambulatory Visit: Payer: PPO | Attending: Neurological Surgery

## 2017-12-09 DIAGNOSIS — Z9181 History of falling: Secondary | ICD-10-CM | POA: Insufficient documentation

## 2017-12-09 DIAGNOSIS — M6281 Muscle weakness (generalized): Secondary | ICD-10-CM | POA: Insufficient documentation

## 2017-12-09 DIAGNOSIS — R2689 Other abnormalities of gait and mobility: Secondary | ICD-10-CM | POA: Diagnosis not present

## 2017-12-09 NOTE — Therapy (Signed)
[place] [facility] [place], [place], [address] Phone: [phone]   Fax:  [phone]  Physical Therapy Evaluation  Patient Details  Name: CERRONE DEBOLD MRN: [id number] Date of Birth: 02/04/1946 Referring Provider: Earleen Newport, MD   Encounter Date: 12/09/2017  PT End of Session - 12/09/17 1640    Visit Number  1    Number of Visits  16    Date for PT Re-Evaluation  02/03/18    PT Start Time  8338    PT Stop Time  1554    PT Time Calculation (min)  57 min    Equipment Utilized During Treatment  Gait belt    Activity Tolerance  Patient tolerated treatment well;Patient limited by fatigue;Treatment limited secondary to medical complications (Comment)    Behavior During Therapy  [facility] for tasks assessed/performed       Past Medical History:  Diagnosis Date  . Anxiety    with diagnosis  . 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 Depression   . Gait abnormality 03/27/2017  . Gall stones   . High cholesterol   . History of kidney stones   . Hypertension    EKG,  chest  4/13 EPIC  . 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  . Nephrolithiasis   . Neuromuscular disorder ([place])    facial nerve pain    Past Surgical History:  Procedure Laterality Date  . BACK SURGERY    . IR RADIOLOGIST EVAL & MGMT  03/18/2017  . Left total hip arthroplasty  08/07/2010  . LUMBAR LAMINECTOMY/DECOMPRESSION MICRODISCECTOMY  02/09/2012   Procedure: LUMBAR LAMINECTOMY/DECOMPRESSION MICRODISCECTOMY 2 LEVELS;  Surgeon: Kristeen Miss, MD;  Location: [facility];  Service: Neurosurgery;  Laterality: Bilateral;  Bilateral Lumbar two-three,lumbar four-five laminectomies  . NEPHROLITHOTOMY Right 02/09/2014   Procedure: NEPHROLITHOTOMY PERCUTANEOUS;  Surgeon: Franchot Gallo, MD;  Location: [facility];  Service: Urology;  Laterality: Right;  . Right total hip arthroplasty  07/13/2014  . TOTAL HIP REVISION Left 05/14/2015   Procedure: TOTAL HIP REVISION;   Surgeon: Dereck Leep, MD;  Location: [facility];  Service: Orthopedics;  Laterality: Left;    There were no vitals filed for this visit.   Subjective Assessment - 12/09/17 1505    Subjective  Patient is a pleasant 72 year old male who presents to physical therapy for lumbar spinal stenosis with LE strengthening.     Pertinent History  Mr. Peeples is a 72 year old right-handed white male with a history of chronic low back pain and leg discomfort associated with weightbearing. He has had prior lumbosacral spine surgery, he has cervical spondylosis without spinal cord compression. 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 He has had an extensive workup that has included MRI evaluation of the entire neuro axis including the brain, cervical spine, thoracic spine, and lumbar spine. The patient walks with a Canadian crutch, he has stumbled or fallen on occasion. He does have some urgency of the bladder, he may have incontinence if he cannot get to the bathroom in time. EMG and nerve conduction study have been done previously showing evidence of chronic L5 and S1 radiculopathy on the right leg. 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  He has no evidence of a myelopathy by MRI of the cervical or thoracic spine. He has had extensive surgery of the lumbar spine with  chronic back pain and mild radiculopathy findings by EMG.  Patient had L2-L5 lumbar fusion. Found that his left leg is 3/4 inch shorter than his right leg and creating a gait abnormality, bothering his back. Wears a lift in L shoe. Sometimes gets stabbing pain in L thigh and can't pick up foot. Will be going on a cruise in June to [place] for 50th anniversary     Limitations  Sitting;Lifting;Standing;Walking;Writing    How long can you sit comfortably?  30 minutes    How long can you stand comfortably?  with canadian crutch 20 minutes    How long can you walk comfortably?   with canadian crutch 5 minutes    Patient Stated Goals  to improve walking, leg strength, be able to go to cruise in June to [place]     Currently in Pain?  Yes    Pain Score  2     Pain Location  Back    Pain Orientation  Lower    Pain Descriptors / Indicators  Stabbing;Aching    Pain Type  Chronic pain    Pain Radiating Towards  legs    Pain Onset  More than a month ago    Aggravating Factors   standing, walking    Pain Relieving Factors  sitting down       Lumbar spinal stenosis with neurogenic LE strengthening      [facility] PT Assessment - 12/09/17 0001      Assessment   Medical Diagnosis  lumbar stenosis with LE weakness    Referring Provider  Earleen Newport, MD    Hand Dominance  Right    Prior Therapy  yes      Precautions   Precautions  Fall;Back    Precaution Booklet Issued  Yes (comment)    Precaution Comments  lumbar fusion     Required Braces or Orthoses  Other Brace/Splint lift in L shoe      Restrictions   Weight Bearing Restrictions  No      Balance Screen   Has the patient fallen in the past 6 months  No    Has the patient had a decrease in activity level because of a fear of falling?   Yes    Is the patient reluctant to leave their home because of a fear of falling?   Yes      [place]  Private residence    Living Arrangements  Spouse/significant other    Available Help at Discharge  Family    Type of [place] to enter    Entrance Stairs-Number of Steps  2 steps    Entrance Stairs-Rails  Can reach both;Right;Left    [place]  Crutches;Grab bars - tub/shower;Toilet riser;Adaptive equipment;Shower seat wheelchair accessible house.       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     Leisure  garden, wants to return to walking more, wants to go on cruise to [place] in June for 50th  anniversary,       Cognition   Overall Cognitive Status  Within Functional Limits for tasks assessed      Sensation   Light Touch  Impaired by gross assessment slight decrease in feet and decrease in lateral gluteals  Coordination   Gross Motor Movements are Fluid and Coordinated  No    Coordination and Movement Description  challenged to due weakness and motor control       Posture/Postural Control   Posture/Postural Control  Postural limitations    Postural Limitations  Rounded Shoulders;Forward head;Flexed trunk;Decreased lumbar lordosis    Posture Comments  leg length discrepancy L 3/4 inch difference corrected with leg length.       ROM / Strength   AROM / PROM / Strength  AROM;Strength      AROM   Overall AROM   Deficits    AROM Assessment Site  Other (comment)    Lumbar Flexion  knee bucklings painful    Lumbar Extension  knee buckling painful    Lumbar - Right Side Bend  painful    Lumbar - Left Side Bend  painful    Lumbar - Right Rotation  painful    Lumbar - Left Rotation  painful       Strength   Overall Strength  Deficits    Strength Assessment Site  Hip;Knee;Ankle    Right/Left Hip  Right;Left    Right Hip Flexion  4-/5    Right Hip Extension  2+/5    Right Hip ABduction  2+/5    Right Hip ADduction  2+/5    Left Hip Flexion  4-/5    Left Hip Extension  2+/5    Left Hip ABduction  2+/5    Left Hip ADduction  2+/5    Right/Left Knee  Right;Left    Right Knee Flexion  4-/5    Right Knee Extension  4-/5    Left Knee Flexion  4-/5    Left Knee Extension  4-/5    Right/Left Ankle  Right;Left    Right Ankle Dorsiflexion  3/5    Right Ankle Plantar Flexion  3/5    Left Ankle Dorsiflexion  3/5    Left Ankle Plantar Flexion  3/5      Flexibility   Soft Tissue Assessment /Muscle Length  yes    Hamstrings  limited bilaterally R 51 degrees, L 54 degrees    Quadriceps  right illiopsoas due to sitting preference       Bed Mobility   Bed Mobility  Rolling  Right;Rolling Left;Sit to Supine;Supine to Sit    Rolling Right  4: Min assist    Rolling Right Details (indicate cue type and reason)  assistance at shoulder and hips for back pain    Rolling Left  4: Min assist    Rolling Left Details (indicate cue type and reason)  assistance at shoulder and hip for back pain    Supine to Sit  3: Mod assist    Supine to Sit Details (indicate cue type and reason)  trunk and LE assistance    Sit to Supine  5: Set up      Transfers   Transfers  Sit to Stand;Stand to Sit    Sit to Stand  5: Supervision;With armrests    Sit to Stand Details  Verbal cues for technique    Five time sit to stand comments   16 seconds with UE support    Stand to Sit  5: Supervision    Stand to Sit Details (indicate cue type and reason)  Verbal cues for sequencing      Ambulation/Gait   Ambulation/Gait  Yes    Ambulation/Gait Assistance  4: Min guard    Ambulation Distance (Feet)  810 Feet    Assistive device  -- Canadian crutch     Gait Pattern  Step-through pattern;Decreased stride length;Decreased dorsiflexion - right;Decreased dorsiflexion - left;Right flexed knee in stance;Left flexed knee in stance;Antalgic;Decreased trunk rotation;Trunk flexed;Narrow base of support;Poor foot clearance - left L foot inversion     Ambulation Surface  Level;Indoor    Gait velocity  1.55m/s      6 minute walk test results    Aerobic Endurance Distance Walked  886      Standardized Balance Assessment   Standardized Balance Assessment  Timed Up and Go Test;10 meter walk test;Five Times Sit to Stand    Five times sit to stand comments   16 seconds with UE support    10 Meter Walk  1.0 m/s with canadian walker      Timed Up and Go Test   TUG  Normal TUG    Normal TUG (seconds)  19         PAIN: Lumbar: Worst 8/10 Best 0/10   SPECIAL TESTS: - SLR      BALANCE: Dynamic Sitting Balance  Normal Able to sit unsupported and weight shift across midline maximally   Good Able to  sit unsupported and weight shift across midline moderately   Good-/Fair+ Able to sit unsupported and weight shift across midline minimally   Fair Minimal weight shifting ipsilateral/front, difficulty crossing midline   Fair- Reach to ipsilateral side and unable to weight shift x  Poor + Able to sit unsupported with min A and reach to ipsilateral side, unable to weight shift   Poor Able to sit unsupported with mod A and reach ipsilateral/front-can't cross midline     Standing Dynamic Balance  Normal Stand independently unsupported, able to weight shift and cross midline maximally   Good Stand independently unsupported, able to weight shift and cross midline moderately   Good-/Fair+ Stand independently unsupported, able to weight shift across midline minimally   Fair Stand independently unsupported, weight shift, and reach ipsilaterally, loss of balance when crossing midline   Poor+ Able to stand with Min A and reach ipsilaterally, unable to weight shift x  Poor Able to stand with Mod A and minimally reach ipsilaterally, unable to cross midline.     Static Sitting Balance  Normal Able to maintain balance against maximal resistance   Good Able to maintain balance against moderate resistance   Good-/Fair+ Accepts minimal resistance x  Fair Able to sit unsupported without balance loss and without UE support   Poor+ Able to maintain with Minimal assistance from individual or chair   Poor Unable to maintain balance-requires mod/max support from individual or chair     Static Standing Balance  Normal Able to maintain standing balance against maximal resistance   Good Able to maintain standing balance against moderate resistance   Good-/Fair+ Able to maintain standing balance against minimal resistance   Fair Able to stand unsupported without UE support and without LOB for 1-2 min   Fair- Requires Min A and UE support to maintain standing without loss of balance x  Poor+ Requires mod A and UE  support to maintain standing without loss of balance   Poor Requires max A and UE support to maintain standing balance without loss       GAIT: L inversion 20 degrees when ambulating   OUTCOME MEASURES: TEST Outcome Interpretation  5 times sit<>stand 16 sec >60 yo, >15 sec indicates increased risk for falls  10 meter walk test  1.0 with canadian walker          m/s <1.0 m/s indicates increased risk for falls; limited community ambulator  Timed up and Go               19  sec <14 sec indicates increased risk for falls  6 minute walk test          810      Feet 1000 feet is community ambulator  LEFS 25/80 Severe percieved disability   MODI  44% Severe disability      Treat: Seated adduction squeezes 10x 5 second holds Seated abduction RTB 10x 3 second holds Seated hip flexion 10x Seated heel and toe raises      Objective measurements completed on examination: See above findings.              PT Education - 12/09/17 1639    Education provided  Yes    Education Details  HEP, POC, safe transfers and mobility     Person(s) Educated  Patient    Methods  Explanation;Demonstration;Handout;Verbal cues;Tactile cues    Comprehension  Verbalized understanding;Returned demonstration;Need further instruction       PT Short Term Goals - 12/09/17 1831      PT SHORT TERM GOAL #1   Title  Patient will increase BLE gross strength to 3+/5 as to improve functional strength for independent gait, increased standing tolerance and increased ADL ability.    Baseline  Gross 3/5    Time  2    Period  Weeks    Status  New    Target Date  12/23/17      PT SHORT TERM GOAL #2   Title  Patient will improve TUG to <14 seconds to improve mobility and decrease fall risk.     Baseline  3/6 19 seconds    Time  2    Period  Weeks    Status  New    Target Date  12/23/17      PT SHORT TERM GOAL #3   Title  Patient will be independent in home exercise program to improve  strength/mobility for better functional independence with ADLs.    Baseline  HEP given    Time  2    Period  Weeks    Status  New    Target Date  12/23/17        PT Long Term Goals - 12/09/17 1835      PT LONG TERM GOAL #1   Title  Patient will increase BLE gross strength to 4+/5 as to improve functional strength for independent gait, increased standing tolerance and increased ADL ability.    Baseline  gross 3/5    Time  8    Period  Weeks    Status  New    Target Date  02/03/18      PT LONG TERM GOAL #2   Title  Patient will increase six minute walk test distance to >1000 for progression to community ambulator and improve gait ability    Baseline  810 ft    Time  8    Period  Weeks    Status  New    Target Date  02/03/18      PT LONG TERM GOAL #3   Title  Patient will increase lower extremity functional scale to >60/80 to demonstrate improved functional mobility and increased tolerance with ADLs.     Baseline  25/80    Time  8  Period  Weeks    Status  New    Target Date  02/03/18      PT LONG TERM GOAL #4   Title  Patient will reduce modified Oswestry score to <20 as to demonstrate minimal disability with ADLs including improved sleeping tolerance, walking/sitting tolerance etc for better mobility with ADLs.     Baseline  3/6/: 44%    Time  8    Period  Weeks    Status  New    Target Date  02/03/18             Plan - 12/09/17 1641    Clinical Impression Statement  Patient is a pleasant 72 year old male who presents to physical therapy with lumbar spinal stenosis with neurological symptoms and LE weakness. History of chronic low back pain and leg discomfort associated with weightbearing. PMH includes lumbar fusion and bilateral hip replacements. Excessive inversion of LLE noted upon standing and ambulation . Weakness of bilateral LE's limit patient's ability to stand from lower surface, ambulate further distances, and challenges balance. Short term muscle  activation is functional as seen in 10 MWT with Canadian crutch =1.63m/s. TUG= 19 seconds with canadian crutch, 6 min walk test =810 feet with canadian crutch, LEFS =25/880, MODI 44%. Tight hamstrings and iliopsoas noted due to compensatory seated patterning. Patient fatigues quickly with ambulation and would benefit from intensive lower extremity strengthening with posture, balance and endurance focuses.     History and Personal Factors relevant to plan of care:  This patient presents with 3, personal factors/ comorbidities , and 4,  body elements including body structures and functions, activity limitations and or participation restrictions. Patient's condition is evolving     Clinical Presentation  Evolving    Clinical Presentation due to:  progressive weakness and gait distrubances     Clinical Decision Making  Moderate    Rehab Potential  Fair    Clinical Impairments Affecting Rehab Potential  (+) good compliance, family support (-) history of multiple surgeries, progressive weakness     PT Frequency  2x / week    PT Duration  8 weeks    PT Treatment/Interventions  ADLs/Self Care Home Management;Aquatic Therapy;Cryotherapy;Ultrasound;Traction;Moist Heat;Electrical Stimulation;DME Instruction;Gait training;Stair training;Balance training;Therapeutic exercise;Therapeutic activities;Functional mobility training;Neuromuscular re-education;Patient/family education;Orthotic Fit/Training;Manual techniques;Passive range of motion;Energy conservation;Taping    PT Next Visit Plan  review HEP, progress balance and LE strengthening    PT Home Exercise Plan  see sheet    Consulted and Agree with Plan of Care  Patient       Patient will benefit from skilled therapeutic intervention in order to improve the following deficits and impairments:  Abnormal gait, Decreased activity tolerance, Decreased coordination, Decreased balance, Decreased endurance, Decreased knowledge of precautions, Decreased mobility,  Decreased strength, Difficulty walking, Impaired flexibility, Impaired perceived functional ability, Postural dysfunction, Improper body mechanics, Pain  Visit Diagnosis: Muscle weakness (generalized)  Other abnormalities of gait and mobility  History of falling     Problem List Patient Active Problem List   Diagnosis Date Noted  . Gait abnormality 03/27/2017  . Pressure injury of skin 11/08/2016  . Lumbar radiculopathy, chronic 11/07/2016  . 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 Renal carcinoma ([place])   . 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 Calculus of kidney 02/09/2014  . Nephrolithiasis   . High cholesterol   Janna Arch, PT, DPT    Janna Arch 12/09/2017, 6:38 PM  [facility] [address]  [place], [place], [address] Phone: [phone]   Fax:  [phone]  Name: NISSIM FLEISCHER MRN: [id number] Date of Birth: 03-Oct-1946

## 2017-12-11 ENCOUNTER — Ambulatory Visit: Payer: PPO

## 2017-12-15 ENCOUNTER — Ambulatory Visit: Payer: PPO | Admitting: Physical Therapy

## 2017-12-15 ENCOUNTER — Encounter: Payer: Self-pay | Admitting: Physical Therapy

## 2017-12-15 DIAGNOSIS — R2689 Other abnormalities of gait and mobility: Secondary | ICD-10-CM

## 2017-12-15 DIAGNOSIS — Z9181 History of falling: Secondary | ICD-10-CM

## 2017-12-15 DIAGNOSIS — M6281 Muscle weakness (generalized): Secondary | ICD-10-CM | POA: Diagnosis not present

## 2017-12-15 NOTE — Therapy (Signed)
[place] MAIN [facility] SERVICES [address] [place], [place], [address] Phone: [phone]   Fax:  [phone]  Physical Therapy Treatment  Patient Details  Name: YASER HARVILL MRN: [id number] Date of Birth: 08/12/1946 Referring Provider: Earleen Newport, MD   Encounter Date: 12/15/2017  PT End of Session - 12/15/17 1433    Visit Number  2    Number of Visits  16    Date for PT Re-Evaluation  02/03/18    PT Start Time  0215    PT Stop Time  0300    PT Time Calculation (min)  45 min    Equipment Utilized During Treatment  Gait belt    Activity Tolerance  Patient tolerated treatment well;Patient limited by fatigue;Treatment limited secondary to medical complications (Comment)    Behavior During Therapy  [facility] for tasks assessed/performed       Past Medical History:  Diagnosis Date  . Anxiety    with diagnosis  . 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 Depression   . Gait abnormality 03/27/2017  . Gall stones   . High cholesterol   . History of kidney stones   . Hypertension    EKG,  chest  4/13 EPIC  . 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  . Nephrolithiasis   . Neuromuscular disorder ([place])    facial nerve pain    Past Surgical History:  Procedure Laterality Date  . BACK SURGERY    . IR RADIOLOGIST EVAL & MGMT  03/18/2017  . Left total hip arthroplasty  08/07/2010  . LUMBAR LAMINECTOMY/DECOMPRESSION MICRODISCECTOMY  02/09/2012   Procedure: LUMBAR LAMINECTOMY/DECOMPRESSION MICRODISCECTOMY 2 LEVELS;  Surgeon: Kristeen Miss, MD;  Location: [place] NEURO ORS;  Service: Neurosurgery;  Laterality: Bilateral;  Bilateral Lumbar two-three,lumbar four-five laminectomies  . NEPHROLITHOTOMY Right 02/09/2014   Procedure: NEPHROLITHOTOMY PERCUTANEOUS;  Surgeon: Franchot Gallo, MD;  Location: [facility] ORS;  Service: Urology;  Laterality: Right;  . Right total hip arthroplasty  07/13/2014  . TOTAL HIP REVISION Left 05/14/2015   Procedure: TOTAL HIP REVISION;   Surgeon: Dereck Leep, MD;  Location: [facility] ORS;  Service: Orthopedics;  Laterality: Left;    There were no vitals filed for this visit.  Subjective Assessment - 12/15/17 1429    Subjective  Patient is doing ok. His pain level is o when sitting and lying and during walking it increases.    Pertinent History  Mr. Shankles is a 72 year old right-handed white male with a history of chronic low back pain and leg discomfort associated with weightbearing. He has had prior lumbosacral spine surgery, he has cervical spondylosis without spinal cord compression. 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 He has had an extensive workup that has included MRI evaluation of the entire neuro axis including the brain, cervical spine, thoracic spine, and lumbar spine. The patient walks with a Canadian crutch, he has stumbled or fallen on occasion. He does have some urgency of the bladder, he may have incontinence if he cannot get to the bathroom in time. EMG and nerve conduction study have been done previously showing evidence of chronic L5 and S1 radiculopathy on the right leg. 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  He has no evidence of a myelopathy by MRI of the cervical or thoracic spine. He has had extensive surgery of the lumbar spine with chronic back pain and  mild radiculopathy findings by EMG.  Patient had L2-L5 lumbar fusion. Found that his left leg is 3/4 inch shorter than his right leg and creating a gait abnormality, bothering his back. Wears a lift in L shoe. Sometimes gets stabbing pain in L thigh and can't pick up foot. Will be going on a cruise in June to [place] for 50th anniversary     Limitations  Sitting;Lifting;Standing;Walking;Writing    How long can you sit comfortably?  30 minutes    How long can you stand comfortably?  with canadian crutch 20 minutes    How long can you walk comfortably?  with canadian crutch 5  minutes    Patient Stated Goals  to improve walking, leg strength, be able to go to cruise in June to [place]     Currently in Pain?  No/denies    Pain Score  0-No pain    Pain Onset  More than a month ago    Multiple Pain Sites  No       Treatment:  Nu-step x 5 mins  Stretching gastroc/ soleus B ankles x 30 sec x 3   Hamstring stretching BLE x 30 x 3 sets  Reviewed HEP for seated FWY:[id number] hip ab/ER with RTB, hip add with pillow squeeze, marching, LAQ   Standing hip abd x 10 BLE  Standing hip extension x 10 BLE  Standing hip flex  x 10 BLE  Patient needs VC for correct form and technique.                        PT Education - 12/15/17 1430    Education provided  Yes    Education Details  reviewed HEP    Person(s) Educated  Patient    Methods  Explanation    Comprehension  Verbalized understanding       PT Short Term Goals - 12/09/17 1831      PT SHORT TERM GOAL #1   Title  Patient will increase BLE gross strength to 3+/5 as to improve functional strength for independent gait, increased standing tolerance and increased ADL ability.    Baseline  Gross 3/5    Time  2    Period  Weeks    Status  New    Target Date  12/23/17      PT SHORT TERM GOAL #2   Title  Patient will improve TUG to <14 seconds to improve mobility and decrease fall risk.     Baseline  3/6 19 seconds    Time  2    Period  Weeks    Status  New    Target Date  12/23/17      PT SHORT TERM GOAL #3   Title  Patient will be independent in home exercise program to improve strength/mobility for better functional independence with ADLs.    Baseline  HEP given    Time  2    Period  Weeks    Status  New    Target Date  12/23/17        PT Long Term Goals - 12/09/17 1835      PT LONG TERM GOAL #1   Title  Patient will increase BLE gross strength to 4+/5 as to improve functional strength for independent gait, increased standing tolerance and increased ADL ability.     Baseline  gross 3/5    Time  8    Period  Weeks    Status  New  Target Date  02/03/18      PT LONG TERM GOAL #2   Title  Patient will increase six minute walk test distance to >1000 for progression to community ambulator and improve gait ability    Baseline  810 ft    Time  8    Period  Weeks    Status  New    Target Date  02/03/18      PT LONG TERM GOAL #3   Title  Patient will increase lower extremity functional scale to >60/80 to demonstrate improved functional mobility and increased tolerance with ADLs.     Baseline  25/80    Time  8    Period  Weeks    Status  New    Target Date  02/03/18      PT LONG TERM GOAL #4   Title  Patient will reduce modified Oswestry score to <20 as to demonstrate minimal disability with ADLs including improved sleeping tolerance, walking/sitting tolerance etc for better mobility with ADLs.     Baseline  3/6/: 44%    Time  8    Period  Weeks    Status  New    Target Date  02/03/18            Plan - 12/15/17 1435    Clinical Impression Statement  Patient instructed in stretching and  LE strengthening exercise.s Advanced HEP with seated and standing exercise. Patient requires min VCs for correct positioning and to slow down LE movement for better strengthening; Patient performs dynamic standing balance training exercises with advanced challenges.. Patient would benefit from additional skilled PT intervention to improve balance/gait safety and reduce fall risk.    Rehab Potential  Fair    Clinical Impairments Affecting Rehab Potential  (+) good compliance, family support (-) history of multiple surgeries, progressive weakness     PT Frequency  2x / week    PT Duration  8 weeks    PT Treatment/Interventions  ADLs/Self Care Home Management;Aquatic Therapy;Cryotherapy;Ultrasound;Traction;Moist Heat;Electrical Stimulation;DME Instruction;Gait training;Stair training;Balance training;Therapeutic exercise;Therapeutic activities;Functional mobility  training;Neuromuscular re-education;Patient/family education;Orthotic Fit/Training;Manual techniques;Passive range of motion;Energy conservation;Taping    PT Next Visit Plan  review HEP, progress balance and LE strengthening    PT Home Exercise Plan  see sheet    Consulted and Agree with Plan of Care  Patient       Patient will benefit from skilled therapeutic intervention in order to improve the following deficits and impairments:  Abnormal gait, Decreased activity tolerance, Decreased coordination, Decreased balance, Decreased endurance, Decreased knowledge of precautions, Decreased mobility, Decreased strength, Difficulty walking, Impaired flexibility, Impaired perceived functional ability, Postural dysfunction, Improper body mechanics, Pain  Visit Diagnosis: Muscle weakness (generalized)  Other abnormalities of gait and mobility  History of falling     Problem List Patient Active Problem List   Diagnosis Date Noted  . Gait abnormality 03/27/2017  . Pressure injury of skin 11/08/2016  . Lumbar radiculopathy, chronic 11/07/2016  . 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 Renal carcinoma ([place])   . 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 Calculus of kidney 02/09/2014  . Nephrolithiasis   . High cholesterol     Alanson Puls, [place] DPT 12/15/2017, 2:38 PM  [place] MAIN [facility] SERVICES [address] [place], [place], [address] Phone: [phone]   Fax:  [phone]  Name: TRACEY STEWART MRN: [id number] Date of Birth: April 25, 1946

## 2017-12-16 DIAGNOSIS — H2512 Age-related nuclear cataract, left eye: Secondary | ICD-10-CM | POA: Diagnosis not present

## 2017-12-22 ENCOUNTER — Encounter: Payer: Self-pay | Admitting: *Deleted

## 2017-12-22 ENCOUNTER — Ambulatory Visit: Payer: PPO | Admitting: Physical Therapy

## 2017-12-22 ENCOUNTER — Encounter: Payer: Self-pay | Admitting: Physical Therapy

## 2017-12-22 DIAGNOSIS — R2689 Other abnormalities of gait and mobility: Secondary | ICD-10-CM

## 2017-12-22 DIAGNOSIS — M6281 Muscle weakness (generalized): Secondary | ICD-10-CM

## 2017-12-22 DIAGNOSIS — Z9181 History of falling: Secondary | ICD-10-CM

## 2017-12-22 NOTE — Therapy (Signed)
[place] MAIN [facility] SERVICES [address] [place], [place], [address] Phone: [phone]   Fax:  [phone]  Physical Therapy Treatment  Patient Details  Name: Bryan Frey MRN: [id number] Date of Birth: 1945-10-16 Referring Provider: Earleen Newport, MD   Encounter Date: 12/22/2017  PT End of Session - 12/22/17 1438    Visit Number  3    Number of Visits  16    Date for PT Re-Evaluation  02/03/18    PT Start Time  0230    PT Stop Time  0310    PT Time Calculation (min)  40 min    Equipment Utilized During Treatment  Gait belt    Activity Tolerance  Patient tolerated treatment well;Patient limited by fatigue;Treatment limited secondary to medical complications (Comment)    Behavior During Therapy  [facility] for tasks assessed/performed       Past Medical History:  Diagnosis Date  . Anxiety    with diagnosis  . 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 Depression   . Gait abnormality 03/27/2017  . Gall stones   . High cholesterol   . History of kidney stones   . HOH (hard of hearing)   . Hypertension    EKG,  chest  4/13 EPIC  . 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  . Nephrolithiasis   . Neuromuscular disorder ([facility])    facial nerve pain  . Stroke [facility])    TIA    Past Surgical History:  Procedure Laterality Date  . BACK SURGERY    . COLONOSCOPY    . IR RADIOLOGIST EVAL & MGMT  03/18/2017  . JOINT REPLACEMENT    . Left total hip arthroplasty  08/07/2010  . LUMBAR LAMINECTOMY/DECOMPRESSION MICRODISCECTOMY  02/09/2012   Procedure: LUMBAR LAMINECTOMY/DECOMPRESSION MICRODISCECTOMY 2 LEVELS;  Surgeon: Kristeen Miss, MD;  Location: [place] NEURO ORS;  Service: Neurosurgery;  Laterality: Bilateral;  Bilateral Lumbar two-three,lumbar four-five laminectomies  . NEPHROLITHOTOMY Right 02/09/2014   Procedure: NEPHROLITHOTOMY PERCUTANEOUS;  Surgeon: Franchot Gallo, MD;  Location: [facility] ORS;  Service: Urology;  Laterality: Right;  . Right total hip  arthroplasty  07/13/2014  . TOTAL HIP REVISION Left 05/14/2015   Procedure: TOTAL HIP REVISION;  Surgeon: Dereck Leep, MD;  Location: [facility] ORS;  Service: Orthopedics;  Laterality: Left;    There were no vitals filed for this visit.  Subjective Assessment - 12/22/17 1437    Subjective  Patient is doing ok. His pain level is o when sitting and lying and during walking it increases.    Pertinent History  Mr. Grieser is a 72 year old right-handed white male with a history of chronic low back pain and leg discomfort associated with weightbearing. He has had prior lumbosacral spine surgery, he has cervical spondylosis without spinal cord compression. 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 He has had an extensive workup that has included MRI evaluation of the entire neuro axis including the brain, cervical spine, thoracic spine, and lumbar spine. The patient walks with a Canadian crutch, he has stumbled or fallen on occasion. He does have some urgency of the bladder, he may have incontinence if he cannot get to the bathroom in time. EMG and nerve conduction study have been done previously showing evidence of chronic L5 and S1 radiculopathy on the right leg. 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  He has no  evidence of a myelopathy by MRI of the cervical or thoracic spine. He has had extensive surgery of the lumbar spine with chronic back pain and mild radiculopathy findings by EMG.  Patient had L2-L5 lumbar fusion. Found that his left leg is 3/4 inch shorter than his right leg and creating a gait abnormality, bothering his back. Wears a lift in L shoe. Sometimes gets stabbing pain in L thigh and can't pick up foot. Will be going on a cruise in June to [place] for 50th anniversary     Limitations  Sitting;Lifting;Standing;Walking;Writing    How long can you sit comfortably?  30 minutes    How long can you stand comfortably?   with canadian crutch 20 minutes    How long can you walk comfortably?  with canadian crutch 5 minutes    Patient Stated Goals  to improve walking, leg strength, be able to go to cruise in June to [place]     Currently in Pain?  No/denies    Pain Score  0-No pain    Pain Onset  More than a month ago    Multiple Pain Sites  No       Treatment:  Nu-step x 5 mins  Stretching gastroc/ soleus B ankles x 30 sec x 3 , verbal cues to keep the heel down  Hamstring stretching BLE  Standing with use of 1 step  x 30 x 3 sets  sidelying knee flex to extension x 20 BLE  sidelying hip abd x 20 BLE  sidelying clam x 20 BLE  Reviewed HEP for seated exercises for  hip ab/ER with RTB, hip add with pillow squeeze, marching, LAQ   Standing hip abd x 10 BLE  Standing hip extension x 10 BLE  Standing hip flex  x 10 BLE  Patient needs VC for correct form and technique. Patient has complaints during sidelying exercises of pain in upper back, clicking and popping in low back.                        PT Education - 12/22/17 1438    Education provided  Yes    Education Details  reviewed HEP    Person(s) Educated  Patient    Methods  Explanation;Demonstration    Comprehension  Verbalized understanding;Returned demonstration       PT Short Term Goals - 12/09/17 1831      PT SHORT TERM GOAL #1   Title  Patient will increase BLE gross strength to 3+/5 as to improve functional strength for independent gait, increased standing tolerance and increased ADL ability.    Baseline  Gross 3/5    Time  2    Period  Weeks    Status  New    Target Date  12/23/17      PT SHORT TERM GOAL #2   Title  Patient will improve TUG to <14 seconds to improve mobility and decrease fall risk.     Baseline  3/6 19 seconds    Time  2    Period  Weeks    Status  New    Target Date  12/23/17      PT SHORT TERM GOAL #3   Title  Patient will be independent in home exercise program to improve  strength/mobility for better functional independence with ADLs.    Baseline  HEP given    Time  2    Period  Weeks    Status  New  Target Date  12/23/17        PT Long Term Goals - 12/09/17 1835      PT LONG TERM GOAL #1   Title  Patient will increase BLE gross strength to 4+/5 as to improve functional strength for independent gait, increased standing tolerance and increased ADL ability.    Baseline  gross 3/5    Time  8    Period  Weeks    Status  New    Target Date  02/03/18      PT LONG TERM GOAL #2   Title  Patient will increase six minute walk test distance to >1000 for progression to community ambulator and improve gait ability    Baseline  810 ft    Time  8    Period  Weeks    Status  New    Target Date  02/03/18      PT LONG TERM GOAL #3   Title  Patient will increase lower extremity functional scale to >60/80 to demonstrate improved functional mobility and increased tolerance with ADLs.     Baseline  25/80    Time  8    Period  Weeks    Status  New    Target Date  02/03/18      PT LONG TERM GOAL #4   Title  Patient will reduce modified Oswestry score to <20 as to demonstrate minimal disability with ADLs including improved sleeping tolerance, walking/sitting tolerance etc for better mobility with ADLs.     Baseline  3/6/: 44%    Time  8    Period  Weeks    Status  New    Target Date  02/03/18            Plan - 12/22/17 1438    Clinical Impression Statement Patient is not able to lie on his stomach and his back is uncomfortable except with a wedge under his knees,  but he can lie on his side. Patient required min verbal cueing during strengthening exercise to correct posture and form. Patient demonstrates ability to perform strengthening exercises  with no pain but with moderate fatigue. Patient will continue to benefit from continued skilled therapy in order to improve dynamic standing balance and increase strength in order to decrease falls    Rehab  Potential  Fair    Clinical Impairments Affecting Rehab Potential  (+) good compliance, family support (-) history of multiple surgeries, progressive weakness     PT Frequency  2x / week    PT Duration  8 weeks    PT Treatment/Interventions  ADLs/Self Care Home Management;Aquatic Therapy;Cryotherapy;Ultrasound;Traction;Moist Heat;Electrical Stimulation;DME Instruction;Gait training;Stair training;Balance training;Therapeutic exercise;Therapeutic activities;Functional mobility training;Neuromuscular re-education;Patient/family education;Orthotic Fit/Training;Manual techniques;Passive range of motion;Energy conservation;Taping    PT Next Visit Plan  review HEP, progress balance and LE strengthening    PT Home Exercise Plan  see sheet    Consulted and Agree with Plan of Care  Patient       Patient will benefit from skilled therapeutic intervention in order to improve the following deficits and impairments:  Abnormal gait, Decreased activity tolerance, Decreased coordination, Decreased balance, Decreased endurance, Decreased knowledge of precautions, Decreased mobility, Decreased strength, Difficulty walking, Impaired flexibility, Impaired perceived functional ability, Postural dysfunction, Improper body mechanics, Pain  Visit Diagnosis: Muscle weakness (generalized)  Other abnormalities of gait and mobility  History of falling     Problem List Patient Active Problem List   Diagnosis Date Noted  . Gait abnormality 03/27/2017  .  Pressure injury of skin 11/08/2016  . Lumbar radiculopathy, chronic 11/07/2016  . 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 Renal carcinoma ([place])   . 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 Calculus of kidney 02/09/2014  . Nephrolithiasis   . High cholesterol     Alanson Puls, [place] DPT 12/22/2017, 2:46 PM  [place] MAIN [facility] SERVICES [address] [place], [place], [address] Phone: [phone]   Fax:   [phone]  Name: Bryan Frey MRN: [id number] Date of Birth: 11/13/1945

## 2017-12-23 ENCOUNTER — Ambulatory Visit: Payer: PPO

## 2017-12-24 ENCOUNTER — Encounter
Admission: RE | Disposition: A | Discharge status: Home / Self Care | Payer: Self-pay | Source: Ambulatory Visit | Attending: Ophthalmology

## 2017-12-24 ENCOUNTER — Ambulatory Visit: Payer: PPO | Admitting: Anesthesiology

## 2017-12-24 ENCOUNTER — Encounter: Payer: Self-pay | Admitting: Emergency Medicine

## 2017-12-24 ENCOUNTER — Ambulatory Visit
Admission: RE | Admit: 2017-12-24 | Discharge: 2017-12-24 | Disposition: A | Discharge status: Home / Self Care | Payer: PPO | Source: Ambulatory Visit | Attending: Ophthalmology | Admitting: Ophthalmology

## 2017-12-24 DIAGNOSIS — H2512 Age-related nuclear cataract, left eye: Secondary | ICD-10-CM | POA: Insufficient documentation

## 2017-12-24 DIAGNOSIS — Z79899 Other long term (current) drug therapy: Secondary | ICD-10-CM | POA: Diagnosis not present

## 2017-12-24 DIAGNOSIS — I10 Essential (primary) hypertension: Secondary | ICD-10-CM | POA: Insufficient documentation

## 2017-12-24 DIAGNOSIS — Z8673 Personal history of transient ischemic attack (TIA), and cerebral infarction without residual deficits: Secondary | ICD-10-CM | POA: Diagnosis not present

## 2017-12-24 DIAGNOSIS — F418 Other specified anxiety disorders: Secondary | ICD-10-CM | POA: Diagnosis not present

## 2017-12-24 DIAGNOSIS — Z85528 Personal history of other malignant neoplasm of kidney: Secondary | ICD-10-CM | POA: Diagnosis not present

## 2017-12-24 DIAGNOSIS — E7800 Pure hypercholesterolemia, unspecified: Secondary | ICD-10-CM | POA: Diagnosis not present

## 2017-12-24 HISTORY — DX: Cerebral infarction, unspecified: I63.9

## 2017-12-24 HISTORY — PX: CATARACT EXTRACTION W/PHACO: SHX586

## 2017-12-24 HISTORY — DX: Unspecified hearing loss, unspecified ear: H91.90

## 2017-12-24 SURGERY — PHACOEMULSIFICATION, CATARACT, WITH IOL INSERTION
Anesthesia: Choice | Site: Eye | Laterality: Left | Wound class: "Clean "

## 2017-12-24 MED ORDER — MOXIFLOXACIN HCL 0.5 % OP SOLN
OPHTHALMIC | Status: AC
  Filled 2017-12-24: qty 3

## 2017-12-24 MED ORDER — LIDOCAINE HCL (PF) 4 % IJ SOLN
INTRAMUSCULAR | Status: AC
  Filled 2017-12-24: qty 5

## 2017-12-24 MED ORDER — SODIUM HYALURONATE 23 MG/ML IO SOLN
INTRAOCULAR | Status: DC | PRN
  Administered 2017-12-24: 0.6 mL via INTRAOCULAR

## 2017-12-24 MED ORDER — MOXIFLOXACIN HCL 0.5 % OP SOLN: 1.0000 [drp] | OPHTHALMIC | Status: DC | PRN

## 2017-12-24 MED ORDER — SODIUM HYALURONATE 23 MG/ML IO SOLN
INTRAOCULAR | Status: AC
  Filled 2017-12-24: qty 0.6

## 2017-12-24 MED ORDER — EPINEPHRINE PF 1 MG/ML IJ SOLN
INTRAMUSCULAR | Status: AC
  Filled 2017-12-24: qty 1

## 2017-12-24 MED ORDER — SODIUM HYALURONATE 10 MG/ML IO SOLN
INTRAOCULAR | Status: DC | PRN
  Administered 2017-12-24: 0.55 mL via INTRAOCULAR

## 2017-12-24 MED ORDER — SODIUM CHLORIDE 0.9 % IV SOLN
INTRAVENOUS | Status: DC
  Administered 2017-12-24: 09:00:00 via INTRAVENOUS

## 2017-12-24 MED ORDER — LIDOCAINE HCL (PF) 4 % IJ SOLN
INTRAMUSCULAR | Status: DC | PRN
  Administered 2017-12-24: 2 mL via OPHTHALMIC

## 2017-12-24 MED ORDER — ARMC OPHTHALMIC DILATING DROPS
1.0000 "application " | OPHTHALMIC | Status: AC
  Administered 2017-12-24 (×3): 1 via OPHTHALMIC

## 2017-12-24 MED ORDER — ARMC OPHTHALMIC DILATING DROPS
OPHTHALMIC | Status: AC
  Administered 2017-12-24: 1 via OPHTHALMIC
  Filled 2017-12-24: qty 0.4

## 2017-12-24 MED ORDER — BSS IO SOLN
INTRAOCULAR | Status: DC | PRN
  Administered 2017-12-24: 1 via INTRAOCULAR

## 2017-12-24 MED ORDER — FENTANYL CITRATE (PF) 100 MCG/2ML IJ SOLN
INTRAMUSCULAR | Status: AC
  Filled 2017-12-24: qty 2

## 2017-12-24 MED ORDER — POVIDONE-IODINE 5 % OP SOLN
OPHTHALMIC | Status: DC | PRN
  Administered 2017-12-24: 1 via OPHTHALMIC

## 2017-12-24 MED ORDER — FENTANYL CITRATE (PF) 100 MCG/2ML IJ SOLN
INTRAMUSCULAR | Status: DC | PRN
  Administered 2017-12-24: 50 ug via INTRAVENOUS

## 2017-12-24 MED ORDER — MIDAZOLAM HCL 2 MG/2ML IJ SOLN
INTRAMUSCULAR | Status: DC | PRN
  Administered 2017-12-24 (×2): 1 mg via INTRAVENOUS

## 2017-12-24 MED ORDER — POVIDONE-IODINE 5 % OP SOLN
OPHTHALMIC | Status: AC
  Filled 2017-12-24: qty 30

## 2017-12-24 MED ORDER — MOXIFLOXACIN HCL 0.5 % OP SOLN
OPHTHALMIC | Status: DC | PRN
  Administered 2017-12-24: 0.2 mL via OPHTHALMIC

## 2017-12-24 MED ORDER — MIDAZOLAM HCL 2 MG/2ML IJ SOLN
INTRAMUSCULAR | Status: AC
  Filled 2017-12-24: qty 2

## 2017-12-24 SURGICAL SUPPLY — 16 items
DISSECTOR HYDRO NUCLEUS 50X22 (MISCELLANEOUS) ×2 IMPLANT
GLOVE BIO SURGEON STRL SZ8 (GLOVE) ×2 IMPLANT
GLOVE BIOGEL M 6.5 STRL (GLOVE) ×2 IMPLANT
GLOVE SURG LX 7.5 STRW (GLOVE) ×1
GLOVE SURG LX STRL 7.5 STRW (GLOVE) ×1 IMPLANT
GOWN STRL REUS W/ TWL LRG LVL3 (GOWN DISPOSABLE) ×2 IMPLANT
GOWN STRL REUS W/TWL LRG LVL3 (GOWN DISPOSABLE) ×2
LABEL CATARACT MEDS ST (LABEL) ×2 IMPLANT
LENS IOL TECNIS ITEC 17.0 (Intraocular Lens) ×1 IMPLANT
PACK CATARACT (MISCELLANEOUS) ×2 IMPLANT
PACK CATARACT KING (MISCELLANEOUS) ×2 IMPLANT
PACK EYE AFTER SURG (MISCELLANEOUS) ×2 IMPLANT
SOL BSS BAG (MISCELLANEOUS) ×2
SOLUTION BSS BAG (MISCELLANEOUS) ×1 IMPLANT
WATER STERILE IRR 250ML POUR (IV SOLUTION) ×2 IMPLANT
WIPE NON LINTING 3.25X3.25 (MISCELLANEOUS) ×2 IMPLANT

## 2017-12-24 NOTE — Transfer of Care (Signed)
Immediate Anesthesia Transfer of Care Note  Patient: Bryan Frey  Procedure(s) Performed: CATARACT EXTRACTION PHACO AND INTRAOCULAR LENS PLACEMENT (IOC) (Left Eye)  Patient Location: PACU  Anesthesia Type:MAC  Level of Consciousness: awake, alert  and oriented  Airway & Oxygen Therapy: Patient Spontanous Breathing  Post-op Assessment: Report given to RN  Post vital signs: Reviewed and stable  Last Vitals:  Vitals Value Taken Time  BP    Temp    Pulse    Resp    SpO2      Last Pain:  Vitals:   12/24/17 0919  TempSrc: Temporal         Complications: No apparent anesthesia complications

## 2017-12-24 NOTE — Anesthesia Postprocedure Evaluation (Signed)
Anesthesia Post Note  Patient: Bryan Frey  Procedure(s) Performed: CATARACT EXTRACTION PHACO AND INTRAOCULAR LENS PLACEMENT ([place]) (Left Eye)  Patient location during evaluation: PACU Anesthesia Type: MAC Level of consciousness: awake, awake and alert and oriented Pain management: pain level controlled Vital Signs Assessment: post-procedure vital signs reviewed and stable Respiratory status: spontaneous breathing Cardiovascular status: blood pressure returned to baseline Postop Assessment: no headache Anesthetic complications: no     Last Vitals:  Vitals Value Taken Time  BP    Temp    Pulse    Resp    SpO2      Last Pain:  Vitals:   12/24/17 0919  TempSrc: Temporal                 Philbert Riser

## 2017-12-24 NOTE — Anesthesia Post-op Follow-up Note (Signed)
Anesthesia QCDR form completed.        

## 2017-12-24 NOTE — H&P (Signed)
The History and Physical notes are on paper, have been signed, and are to be scanned.   I have examined the patient and there are no changes to the H&P.   Benay Pillow 12/24/2017 10:05 AM

## 2017-12-24 NOTE — Discharge Instructions (Signed)
Eye Surgery Discharge Instructions  Expect mild scratchy sensation or mild soreness. DO NOT RUB YOUR EYE!  The day of surgery:  Minimal physical activity, but bed rest is not required  No reading, computer work, or close hand work  No bending, lifting, or straining.  May watch TV  For 24 hours:  No driving, legal decisions, or alcoholic beverages  Safety precautions  Eat anything you prefer: It is better to start with liquids, then soup then solid foods.  _____ Eye patch should be worn until postoperative exam tomorrow.  ____ Solar shield eyeglasses should be worn for comfort in the sunlight/patch while sleeping  Resume all regular medications including aspirin or Coumadin if these were discontinued prior to surgery. You may shower, bathe, shave, or wash your hair. Tylenol may be taken for mild discomfort.  Call your doctor if you experience significant pain, nausea, or vomiting, fever > 101 or other signs of infection. [phone] or [phone] Specific instructions:  Follow-up Information    Eulogio Bear, MD Follow up.   Specialty:  Ophthalmology Why:  March 22 at 10:40am Contact information: [place] [phone]

## 2017-12-24 NOTE — Anesthesia Preprocedure Evaluation (Addendum)
Anesthesia Evaluation  Patient identified by MRN, date of birth, ID band Patient awake    Reviewed: Allergy & Precautions, NPO status , Patient's Chart, lab work & pertinent test results  Airway Mallampati: III       Dental   Pulmonary           Cardiovascular hypertension, Pt. on medications      Neuro/Psych Anxiety Depression CVA    GI/Hepatic   Endo/Other    Renal/GU Renal disease (stones)     Musculoskeletal   Abdominal   Peds  Hematology   Anesthesia Other Findings   Reproductive/Obstetrics                            Anesthesia Physical Anesthesia Plan  ASA: III  Anesthesia Plan: MAC   Post-op Pain Management:    Induction:   PONV Risk Score and Plan:   Airway Management Planned:   Additional Equipment:   Intra-op Plan:   Post-operative Plan:   Informed Consent: I have reviewed the patients History and Physical, chart, labs and discussed the procedure including the risks, benefits and alternatives for the proposed anesthesia with the patient or authorized representative who has indicated his/her understanding and acceptance.     Plan Discussed with:   Anesthesia Plan Comments:         Anesthesia Quick Evaluation

## 2017-12-24 NOTE — Op Note (Signed)
OPERATIVE NOTE  Bryan Frey [id number] 12/24/2017   PREOPERATIVE DIAGNOSIS:  Nuclear sclerotic cataract left eye.  H25.12   POSTOPERATIVE DIAGNOSIS:    Nuclear sclerotic cataract left eye.     PROCEDURE:  Phacoemusification with posterior chamber intraocular lens placement of the left eye   LENS:   Implant Name Type Inv. Item Serial No. Manufacturer Lot No. LRB No. Used  LENS IOL DIOP 17.0 - [id number] Intraocular Lens LENS IOL DIOP 17.0 [id number] AMO  Left 1       PCB00 +17.0   ULTRASOUND TIME: 0 minutes 30.4 seconds.  CDE 2.84   SURGEON:  Benay Pillow, MD, MPH   ANESTHESIA:  Topical with tetracaine drops augmented with 1% preservative-free intracameral lidocaine.  ESTIMATED BLOOD LOSS: <1 mL   COMPLICATIONS:  None.   DESCRIPTION OF PROCEDURE:  The patient was identified in the holding room and transported to the operating room and placed in the supine position under the operating microscope.  The left eye was identified as the operative eye and it was prepped and draped in the usual sterile ophthalmic fashion.   A 1.0 millimeter clear-corneal paracentesis was made at the 5:00 position. 0.5 ml of preservative-free 1% lidocaine with epinephrine was injected into the anterior chamber.  The anterior chamber was filled with Healon 5 viscoelastic.  A 2.4 millimeter keratome was used to make a near-clear corneal incision at the 2:00 position.  A curvilinear capsulorrhexis was made with a cystotome and capsulorrhexis forceps.  Balanced salt solution was used to hydrodissect and hydrodelineate the nucleus.   Phacoemulsification was then used in stop and chop fashion to remove the lens nucleus and epinucleus.  The remaining cortex was then removed using the irrigation and aspiration handpiece. Healon was then placed into the capsular bag to distend it for lens placement.  A lens was then injected into the capsular bag.  The remaining viscoelastic was aspirated.   Wounds were  hydrated with balanced salt solution.  The anterior chamber was inflated to a physiologic pressure with balanced salt solution.  Intracameral vigamox 0.1 mL undiltued was injected into the eye and a drop placed onto the ocular surface.  No wound leaks were noted.  The patient was taken to the recovery room in stable condition without complications of anesthesia or surgery  Benay Pillow 12/24/2017, 10:38 AM

## 2017-12-25 ENCOUNTER — Ambulatory Visit: Payer: PPO | Admitting: Physical Therapy

## 2017-12-29 ENCOUNTER — Ambulatory Visit: Payer: PPO

## 2017-12-29 DIAGNOSIS — M6281 Muscle weakness (generalized): Secondary | ICD-10-CM

## 2017-12-29 DIAGNOSIS — R2689 Other abnormalities of gait and mobility: Secondary | ICD-10-CM

## 2017-12-29 DIAGNOSIS — Z9181 History of falling: Secondary | ICD-10-CM

## 2017-12-29 NOTE — Therapy (Signed)
[place] MAIN [facility] SERVICES [address] [place], [place], [address] Phone: [phone]   Fax:  [phone]  Physical Therapy Treatment  Patient Details  Name: Bryan Frey MRN: [id number] Date of Birth: March 19, 1946 Referring Provider: Earleen Newport, MD   Encounter Date: 12/29/2017  PT End of Session - 12/29/17 1436    Visit Number  4    Number of Visits  16    Date for PT Re-Evaluation  02/03/18    PT Start Time  7544    PT Stop Time  1515    PT Time Calculation (min)  44 min    Equipment Utilized During Treatment  Gait belt    Activity Tolerance  Patient tolerated treatment well;Patient limited by fatigue;Treatment limited secondary to medical complications (Comment)    Behavior During Therapy  [facility] for tasks assessed/performed       Past Medical History:  Diagnosis Date  . Anxiety    with diagnosis  . 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 Depression   . Gait abnormality 03/27/2017  . Gall stones   . High cholesterol   . History of kidney stones   . HOH (hard of hearing)   . Hypertension    EKG,  chest  4/13 EPIC  . 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  . Nephrolithiasis   . Neuromuscular disorder (HCC)    facial nerve pain  . Stroke [facility])    TIA    Past Surgical History:  Procedure Laterality Date  . BACK SURGERY    . CATARACT EXTRACTION W/PHACO Left 12/24/2017   Procedure: CATARACT EXTRACTION PHACO AND INTRAOCULAR LENS PLACEMENT (IOC);  Surgeon: Eulogio Bear, MD;  Location: [facility] ORS;  Service: Ophthalmology;  Laterality: Left;  Lot # [id number] H [place]: 00:30.4 AP%: 9.4 CDE: 2.84  . COLONOSCOPY    . IR RADIOLOGIST EVAL & MGMT  03/18/2017  . JOINT REPLACEMENT    . Left total hip arthroplasty  08/07/2010  . LUMBAR LAMINECTOMY/DECOMPRESSION MICRODISCECTOMY  02/09/2012   Procedure: LUMBAR LAMINECTOMY/DECOMPRESSION MICRODISCECTOMY 2 LEVELS;  Surgeon: Kristeen Miss, MD;  Location: [place] NEURO ORS;  Service: Neurosurgery;   Laterality: Bilateral;  Bilateral Lumbar two-three,lumbar four-five laminectomies  . NEPHROLITHOTOMY Right 02/09/2014   Procedure: NEPHROLITHOTOMY PERCUTANEOUS;  Surgeon: Franchot Gallo, MD;  Location: [facility] ORS;  Service: Urology;  Laterality: Right;  . Right total hip arthroplasty  07/13/2014  . TOTAL HIP REVISION Left 05/14/2015   Procedure: TOTAL HIP REVISION;  Surgeon: Dereck Leep, MD;  Location: [facility] ORS;  Service: Orthopedics;  Laterality: Left;    There were no vitals filed for this visit.  Subjective Assessment - 12/29/17 1435    Subjective  Patient reports no falls but a couple stumbles since last visit. Has some leg pain today. Reports compliance with HEP    Pertinent History  Mr. Tamburrino is a 72 year old right-handed white male with a history of chronic low back pain and leg discomfort associated with weightbearing. He has had prior lumbosacral spine surgery, he has cervical spondylosis without spinal cord compression. 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 He has had an extensive workup that has included MRI evaluation of the entire neuro axis including the brain, cervical spine, thoracic spine, and lumbar spine. The patient walks with a Canadian crutch, he has stumbled or fallen on occasion. He does have some urgency of the bladder,  he may have incontinence if he cannot get to the bathroom in time. EMG and nerve conduction study have been done previously showing evidence of chronic L5 and S1 radiculopathy on the right leg. 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  He has no evidence of a myelopathy by MRI of the cervical or thoracic spine. He has had extensive surgery of the lumbar spine with chronic back pain and mild radiculopathy findings by EMG.  Patient had L2-L5 lumbar fusion. Found that his left leg is 3/4 inch shorter than his right leg and creating a gait abnormality, bothering his back. Wears a lift  in L shoe. Sometimes gets stabbing pain in L thigh and can't pick up foot. Will be going on a cruise in June to [place] for 50th anniversary     Limitations  Sitting;Lifting;Standing;Walking;Writing    How long can you sit comfortably?  30 minutes    How long can you stand comfortably?  with canadian crutch 20 minutes    How long can you walk comfortably?  with canadian crutch 5 minutes    Patient Stated Goals  to improve walking, leg strength, be able to go to cruise in June to [place]     Currently in Pain?  Yes    Pain Score  4     Pain Location  Leg    Pain Orientation  Left    Pain Descriptors / Indicators  Aching    Pain Type  Acute pain;Chronic pain    Pain Onset  More than a month ago    Pain Frequency  Intermittent         Nustep level 4 4 minutes  Standing :  Marches 10x L 10x R , 3 trials, unable to perform with SUE support, required BUE support to one hand with opp hand flat  LE flexion 2x10 L and R BUE support LE extension 2x10 L and R  BUE support  Airex pad: balance static 2x30 seconds Airex pad: horizontal head turns 2x30 seconds   Step over and back orange hurdle 15x L and R   Side foot tap 6" step 15x each leg.   YTB eversion seated 15x   No data recorded               PT Education - 12/29/17 1436    Education provided  Yes    Education Details  exercise technique     Person(s) Educated  Patient    Methods  Explanation;Demonstration;Verbal cues    Comprehension  Verbalized understanding;Returned demonstration       PT Short Term Goals - 12/09/17 1831      PT SHORT TERM GOAL #1   Title  Patient will increase BLE gross strength to 3+/5 as to improve functional strength for independent gait, increased standing tolerance and increased ADL ability.    Baseline  Gross 3/5    Time  2    Period  Weeks    Status  New    Target Date  12/23/17      PT SHORT TERM GOAL #2   Title  Patient will improve TUG to <14 seconds to improve mobility and  decrease fall risk.     Baseline  3/6 19 seconds    Time  2    Period  Weeks    Status  New    Target Date  12/23/17      PT SHORT TERM GOAL #3   Title  Patient will be independent  in home exercise program to improve strength/mobility for better functional independence with ADLs.    Baseline  HEP given    Time  2    Period  Weeks    Status  New    Target Date  12/23/17        PT Long Term Goals - 12/09/17 1835      PT LONG TERM GOAL #1   Title  Patient will increase BLE gross strength to 4+/5 as to improve functional strength for independent gait, increased standing tolerance and increased ADL ability.    Baseline  gross 3/5    Time  8    Period  Weeks    Status  New    Target Date  02/03/18      PT LONG TERM GOAL #2   Title  Patient will increase six minute walk test distance to >1000 for progression to community ambulator and improve gait ability    Baseline  810 ft    Time  8    Period  Weeks    Status  New    Target Date  02/03/18      PT LONG TERM GOAL #3   Title  Patient will increase lower extremity functional scale to >60/80 to demonstrate improved functional mobility and increased tolerance with ADLs.     Baseline  25/80    Time  8    Period  Weeks    Status  New    Target Date  02/03/18      PT LONG TERM GOAL #4   Title  Patient will reduce modified Oswestry score to <20 as to demonstrate minimal disability with ADLs including improved sleeping tolerance, walking/sitting tolerance etc for better mobility with ADLs.     Baseline  3/6/: 44%    Time  8    Period  Weeks    Status  New    Target Date  02/03/18            Plan - 12/29/17 1505    Clinical Impression Statement  Patient limited in balance with decreased ability to perform single limb stance without UE support. Patient requires frequent seated rest breaks due to decreased capacity for standing interventions. Patient will continue to benefit from skilled physical therapy in order to improve  dynamic standing balance and increase strength in order to decrease falls.     Rehab Potential  Fair    Clinical Impairments Affecting Rehab Potential  (+) good compliance, family support (-) history of multiple surgeries, progressive weakness     PT Frequency  2x / week    PT Duration  8 weeks    PT Treatment/Interventions  ADLs/Self Care Home Management;Aquatic Therapy;Cryotherapy;Ultrasound;Traction;Moist Heat;Electrical Stimulation;DME Instruction;Gait training;Stair training;Balance training;Therapeutic exercise;Therapeutic activities;Functional mobility training;Neuromuscular re-education;Patient/family education;Orthotic Fit/Training;Manual techniques;Passive range of motion;Energy conservation;Taping    PT Next Visit Plan  review HEP, progress balance and LE strengthening    PT Home Exercise Plan  see sheet    Consulted and Agree with Plan of Care  Patient       Patient will benefit from skilled therapeutic intervention in order to improve the following deficits and impairments:  Abnormal gait, Decreased activity tolerance, Decreased coordination, Decreased balance, Decreased endurance, Decreased knowledge of precautions, Decreased mobility, Decreased strength, Difficulty walking, Impaired flexibility, Impaired perceived functional ability, Postural dysfunction, Improper body mechanics, Pain  Visit Diagnosis: Muscle weakness (generalized)  Other abnormalities of gait and mobility  History of falling     Problem List Patient  Active Problem List   Diagnosis Date Noted  . Gait abnormality 03/27/2017  . Pressure injury of skin 11/08/2016  . Lumbar radiculopathy, chronic 11/07/2016  . 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 Renal carcinoma ([place])   . 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 Calculus of kidney 02/09/2014  . Nephrolithiasis   . High cholesterol    Janna Arch, PT, DPT   Janna Arch 12/29/2017, 3:16 PM  [place] MAIN  [facility] SERVICES [address] [place], [place], [address] Phone: [phone]   Fax:  [phone]  Name: Bryan Frey MRN: [id number] Date of Birth: 1945-10-24

## 2018-01-01 ENCOUNTER — Ambulatory Visit: Payer: PPO | Admitting: Physical Therapy

## 2018-01-01 ENCOUNTER — Encounter: Payer: Self-pay | Admitting: Physical Therapy

## 2018-01-01 VITALS — BP 131/84 | HR 79

## 2018-01-01 DIAGNOSIS — M6281 Muscle weakness (generalized): Secondary | ICD-10-CM | POA: Diagnosis not present

## 2018-01-01 DIAGNOSIS — Z9181 History of falling: Secondary | ICD-10-CM

## 2018-01-01 DIAGNOSIS — R2689 Other abnormalities of gait and mobility: Secondary | ICD-10-CM

## 2018-01-01 NOTE — Therapy (Signed)
[place] MAIN [facility] SERVICES [address] [place], [place], [address] Phone: [phone]   Fax:  [phone]  Physical Therapy Treatment  Patient Details  Name: Bryan Frey MRN: [id number] Date of Birth: 06-May-1946 Referring Provider: Earleen Newport, MD   Encounter Date: 01/01/2018  PT End of Session - 01/01/18 0956    Visit Number  5    Number of Visits  16    Date for PT Re-Evaluation  02/03/18    PT Start Time  0956    PT Stop Time  1044    PT Time Calculation (min)  48 min    Equipment Utilized During Treatment  Gait belt    Activity Tolerance  Patient tolerated treatment well;Patient limited by fatigue;Treatment limited secondary to medical complications (Comment)    Behavior During Therapy  [facility] for tasks assessed/performed       Past Medical History:  Diagnosis Date  . Anxiety    with diagnosis  . 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 Depression   . Gait abnormality 03/27/2017  . Gall stones   . High cholesterol   . History of kidney stones   . HOH (hard of hearing)   . Hypertension    EKG,  chest  4/13 EPIC  . 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  . Nephrolithiasis   . Neuromuscular disorder (HCC)    facial nerve pain  . Stroke [facility])    TIA    Past Surgical History:  Procedure Laterality Date  . BACK SURGERY    . CATARACT EXTRACTION W/PHACO Left 12/24/2017   Procedure: CATARACT EXTRACTION PHACO AND INTRAOCULAR LENS PLACEMENT (IOC);  Surgeon: Eulogio Bear, MD;  Location: [facility] ORS;  Service: Ophthalmology;  Laterality: Left;  Lot # [id number] H [place]: 00:30.4 AP%: 9.4 CDE: 2.84  . COLONOSCOPY    . IR RADIOLOGIST EVAL & MGMT  03/18/2017  . JOINT REPLACEMENT    . Left total hip arthroplasty  08/07/2010  . LUMBAR LAMINECTOMY/DECOMPRESSION MICRODISCECTOMY  02/09/2012   Procedure: LUMBAR LAMINECTOMY/DECOMPRESSION MICRODISCECTOMY 2 LEVELS;  Surgeon: Kristeen Miss, MD;  Location: [place] NEURO ORS;  Service: Neurosurgery;   Laterality: Bilateral;  Bilateral Lumbar two-three,lumbar four-five laminectomies  . NEPHROLITHOTOMY Right 02/09/2014   Procedure: NEPHROLITHOTOMY PERCUTANEOUS;  Surgeon: Franchot Gallo, MD;  Location: [facility]S;  Service: Urology;  Laterality: Right;  . Right total hip arthroplasty  07/13/2014  . TOTAL HIP REVISION Left 05/14/2015   Procedure: TOTAL HIP REVISION;  Surgeon: Dereck Leep, MD;  Location: [facility] ORS;  Service: Orthopedics;  Laterality: Left;    Vitals:   01/01/18 1003  BP: 131/84  Pulse: 79  SpO2: 97%    Subjective Assessment - 01/01/18 1002    Subjective  Pt denies any falls since last session.  He is feeling "slow" as he is not a morning person.  Pt has been completing his HEP every day that he doesn't have therapy.      Pertinent History  Bryan Frey is a 72 year old right-handed white male with a history of chronic low back pain and leg discomfort associated with weightbearing. He has had prior lumbosacral spine surgery, he has cervical spondylosis without spinal cord compression. 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 He has had an extensive workup that has included MRI evaluation of the entire neuro axis including the brain, cervical spine, thoracic spine, and lumbar  spine. The patient walks with a Canadian crutch, he has stumbled or fallen on occasion. He does have some urgency of the bladder, he may have incontinence if he cannot get to the bathroom in time. EMG and nerve conduction study have been done previously showing evidence of chronic L5 and S1 radiculopathy on the right leg. 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  He has no evidence of a myelopathy by MRI of the cervical or thoracic spine. He has had extensive surgery of the lumbar spine with chronic back pain and mild radiculopathy findings by EMG.  Patient had L2-L5 lumbar fusion. Found that his left leg is 3/4 inch shorter than his  right leg and creating a gait abnormality, bothering his back. Wears a lift in L shoe. Sometimes gets stabbing pain in L thigh and can't pick up foot. Will be going on a cruise in June to [place] for 50th anniversary     Limitations  Sitting;Lifting;Standing;Walking;Writing    How long can you sit comfortably?  30 minutes    How long can you stand comfortably?  with canadian crutch 20 minutes    How long can you walk comfortably?  with canadian crutch 5 minutes    Patient Stated Goals  to improve walking, leg strength, be able to go to cruise in June to [place]     Currently in Pain?  No/denies    Pain Onset  More than a month ago       TREATMENT  Marching with high knees in // bars with BUE support x6 lengths   Marching with GTB resistance in standing with SUE support 2x20 each LE   Standing LE extension 2x10 L and R BUE support   Sit<>Stand with airex pad under LLE to promote greater recruitment of RLE musculature. x5 with RUE assist. x5 without UE assist. Again x10 without UE assist.   Airex pad: Rhomberg stance balance static 2x1 minute   Airex pad: Rhomberg stance horizontal head turns 2x30 seconds   Step over and back orange hurdle 15x with RUE assist   Stepping L and R over orange hurdle x15 with RUE assist   Ambulating in // bars with loftstrand crutch and BTB hip F and knee F assist x4 minutes   Ambulating in gym with loftstrand and BTB hip F and knee F assist x300 ft   Ambulating in gym without loftstrand and BTB hip F and knee F assist x200 ft limited by fatigue                  PT Education - 01/01/18 0956    Education provided  Yes    Education Details  Exercise technique    Person(s) Educated  Patient    Methods  Explanation;Demonstration;Verbal cues    Comprehension  Verbalized understanding;Returned demonstration;Verbal cues required;Need further instruction       PT Short Term Goals - 12/09/17 1831      PT SHORT TERM GOAL #1   Title   Patient will increase BLE gross strength to 3+/5 as to improve functional strength for independent gait, increased standing tolerance and increased ADL ability.    Baseline  Gross 3/5    Time  2    Period  Weeks    Status  New    Target Date  12/23/17      PT SHORT TERM GOAL #2   Title  Patient will improve TUG to <14 seconds to improve mobility and decrease fall risk.  Baseline  3/6 19 seconds    Time  2    Period  Weeks    Status  New    Target Date  12/23/17      PT SHORT TERM GOAL #3   Title  Patient will be independent in home exercise program to improve strength/mobility for better functional independence with ADLs.    Baseline  HEP given    Time  2    Period  Weeks    Status  New    Target Date  12/23/17        PT Long Term Goals - 12/09/17 1835      PT LONG TERM GOAL #1   Title  Patient will increase BLE gross strength to 4+/5 as to improve functional strength for independent gait, increased standing tolerance and increased ADL ability.    Baseline  gross 3/5    Time  8    Period  Weeks    Status  New    Target Date  02/03/18      PT LONG TERM GOAL #2   Title  Patient will increase six minute walk test distance to >1000 for progression to community ambulator and improve gait ability    Baseline  810 ft    Time  8    Period  Weeks    Status  New    Target Date  02/03/18      PT LONG TERM GOAL #3   Title  Patient will increase lower extremity functional scale to >60/80 to demonstrate improved functional mobility and increased tolerance with ADLs.     Baseline  25/80    Time  8    Period  Weeks    Status  New    Target Date  02/03/18      PT LONG TERM GOAL #4   Title  Patient will reduce modified Oswestry score to <20 as to demonstrate minimal disability with ADLs including improved sleeping tolerance, walking/sitting tolerance etc for better mobility with ADLs.     Baseline  3/6/: 44%    Time  8    Period  Weeks    Status  New    Target Date   02/03/18            Plan - 01/01/18 1004    Clinical Impression Statement  Had pt complete sit<>stand with airex under LLE to challenge RLE as this LE demonstrates greater weakness.  Pt able to do this without UE assist but with difficulty. Pt demonstrates unsteadiness on uneven surfaces (airex) but no LOB.  Trialed use of theraband assist for hip F and knee F with success and pt able to ambulate in // bars without UE support.  Pt will benefit from continued skilled PT interventions for improved balnce, strenth, and safety.    Rehab Potential  Fair    Clinical Impairments Affecting Rehab Potential  (+) good compliance, family support (-) history of multiple surgeries, progressive weakness     PT Frequency  2x / week    PT Duration  8 weeks    PT Treatment/Interventions  ADLs/Self Care Home Management;Aquatic Therapy;Cryotherapy;Ultrasound;Traction;Moist Heat;Electrical Stimulation;DME Instruction;Gait training;Stair training;Balance training;Therapeutic exercise;Therapeutic activities;Functional mobility training;Neuromuscular re-education;Patient/family education;Orthotic Fit/Training;Manual techniques;Passive range of motion;Energy conservation;Taping    PT Next Visit Plan  review HEP, progress balance and LE strengthening    PT Home Exercise Plan  see sheet    Consulted and Agree with Plan of Care  Patient  Patient will benefit from skilled therapeutic intervention in order to improve the following deficits and impairments:  Abnormal gait, Decreased activity tolerance, Decreased coordination, Decreased balance, Decreased endurance, Decreased knowledge of precautions, Decreased mobility, Decreased strength, Difficulty walking, Impaired flexibility, Impaired perceived functional ability, Postural dysfunction, Improper body mechanics, Pain  Visit Diagnosis: Muscle weakness (generalized)  Other abnormalities of gait and mobility  History of falling     Problem List Patient  Active Problem List   Diagnosis Date Noted  . Gait abnormality 03/27/2017  . Pressure injury of skin 11/08/2016  . Lumbar radiculopathy, chronic 11/07/2016  . 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 Renal carcinoma ([place])   . 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 Calculus of kidney 02/09/2014  . Nephrolithiasis   . High cholesterol     Collie Siad PT, DPT 01/01/2018, 10:44 AM  [place] MAIN [facility] SERVICES [address] [place], [place], [address] Phone: [phone]   Fax:  [phone]  Name: Bryan Frey MRN: [id number] Date of Birth: 1946-03-29

## 2018-01-04 ENCOUNTER — Ambulatory Visit: Payer: PPO

## 2018-01-05 ENCOUNTER — Ambulatory Visit: Payer: PPO | Attending: Neurological Surgery

## 2018-01-05 DIAGNOSIS — R2689 Other abnormalities of gait and mobility: Secondary | ICD-10-CM | POA: Insufficient documentation

## 2018-01-05 DIAGNOSIS — Z9181 History of falling: Secondary | ICD-10-CM | POA: Insufficient documentation

## 2018-01-05 DIAGNOSIS — M6281 Muscle weakness (generalized): Secondary | ICD-10-CM | POA: Insufficient documentation

## 2018-01-07 ENCOUNTER — Ambulatory Visit: Payer: PPO

## 2018-01-07 DIAGNOSIS — R2689 Other abnormalities of gait and mobility: Secondary | ICD-10-CM

## 2018-01-07 DIAGNOSIS — Z9181 History of falling: Secondary | ICD-10-CM | POA: Diagnosis not present

## 2018-01-07 DIAGNOSIS — M6281 Muscle weakness (generalized): Secondary | ICD-10-CM | POA: Diagnosis not present

## 2018-01-07 NOTE — Therapy (Signed)
[place] MAIN [facility] SERVICES [address] [place], [place], [address] Phone: [phone]   Fax:  [phone]  Physical Therapy Treatment  Patient Details  Name: Bryan Frey MRN: [id number] Date of Birth: 11-Mar-1946 Referring Provider: Earleen Newport, MD   Encounter Date: 01/07/2018  PT End of Session - 01/07/18 1421    Visit Number  6    Number of Visits  16    Date for PT Re-Evaluation  02/03/18    PT Start Time  4627    PT Stop Time  1429    PT Time Calculation (min)  44 min    Equipment Utilized During Treatment  Gait belt    Activity Tolerance  Patient tolerated treatment well;Patient limited by fatigue;Treatment limited secondary to medical complications (Comment)    Behavior During Therapy  [facility] for tasks assessed/performed       Past Medical History:  Diagnosis Date  . Anxiety    with diagnosis  . 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 Depression   . Gait abnormality 03/27/2017  . Gall stones   . High cholesterol   . History of kidney stones   . HOH (hard of hearing)   . Hypertension    EKG,  chest  4/13 EPIC  . 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  . Nephrolithiasis   . Neuromuscular disorder (HCC)    facial nerve pain  . Stroke [facility])    TIA    Past Surgical History:  Procedure Laterality Date  . BACK SURGERY    . CATARACT EXTRACTION W/PHACO Left 12/24/2017   Procedure: CATARACT EXTRACTION PHACO AND INTRAOCULAR LENS PLACEMENT (IOC);  Surgeon: Eulogio Bear, MD;  Location: [facility] ORS;  Service: Ophthalmology;  Laterality: Left;  Lot # [id number] H [place]: 00:30.4 AP%: 9.4 CDE: 2.84  . COLONOSCOPY    . IR RADIOLOGIST EVAL & MGMT  03/18/2017  . JOINT REPLACEMENT    . Left total hip arthroplasty  08/07/2010  . LUMBAR LAMINECTOMY/DECOMPRESSION MICRODISCECTOMY  02/09/2012   Procedure: LUMBAR LAMINECTOMY/DECOMPRESSION MICRODISCECTOMY 2 LEVELS;  Surgeon: Kristeen Miss, MD;  Location: [place] NEURO ORS;  Service: Neurosurgery;   Laterality: Bilateral;  Bilateral Lumbar two-three,lumbar four-five laminectomies  . NEPHROLITHOTOMY Right 02/09/2014   Procedure: NEPHROLITHOTOMY PERCUTANEOUS;  Surgeon: Franchot Gallo, MD;  Location: [facility] ORS;  Service: Urology;  Laterality: Right;  . Right total hip arthroplasty  07/13/2014  . TOTAL HIP REVISION Left 05/14/2015   Procedure: TOTAL HIP REVISION;  Surgeon: Dereck Leep, MD;  Location: [facility] ORS;  Service: Orthopedics;  Laterality: Left;    There were no vitals filed for this visit.  Subjective Assessment - 01/07/18 1359    Subjective  Patient reports no falls, Feels 1-2/10 pain in lower back. Pt. compliant with HEP.     Pertinent History  [name] is a 72 year old right-handed white male with a history of chronic low back pain and leg discomfort associated with weightbearing. He has had prior lumbosacral spine surgery, he has cervical spondylosis without spinal cord compression. 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 He has had an extensive workup that has included MRI evaluation of the entire neuro axis including the brain, cervical spine, thoracic spine, and lumbar spine. The patient walks with a Canadian crutch, he has stumbled or fallen on occasion. He does have some urgency of the bladder, he may have incontinence if  he cannot get to the bathroom in time. EMG and nerve conduction study have been done previously showing evidence of chronic L5 and S1 radiculopathy on the right leg. 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  He has no evidence of a myelopathy by MRI of the cervical or thoracic spine. He has had extensive surgery of the lumbar spine with chronic back pain and mild radiculopathy findings by EMG.  Patient had L2-L5 lumbar fusion. Found that his left leg is 3/4 inch shorter than his right leg and creating a gait abnormality, bothering his back. Wears a lift in L shoe. Sometimes gets  stabbing pain in L thigh and can't pick up foot. Will be going on a cruise in June to [place] for 50th anniversary     Limitations  Sitting;Lifting;Standing;Walking;Writing    How long can you sit comfortably?  30 minutes    How long can you stand comfortably?  with canadian crutch 20 minutes    How long can you walk comfortably?  with canadian crutch 5 minutes    Patient Stated Goals  to improve walking, leg strength, be able to go to cruise in June to [place]     Currently in Pain?  Yes    Pain Score  2     Pain Location  Back    Pain Orientation  Lower    Pain Descriptors / Indicators  Aching    Pain Type  Acute pain;Chronic pain    Pain Onset  More than a month ago    Pain Frequency  Intermittent      Marching with high knees on Airex pad in // bars with SUE support 12x each leg : With BTB 12x   Airex pad: Rhomberg stance balance static x1 minute with BTB Hip F   Airex pad: Rhomberg stance horizontal and vertically head turns 1x60 seconds with BTB Hip F   6" step side toe taps with BTB hip F 12x each step SUE support  6" step toe taps with BTB hip F 12x each leg, SUE support   Ambulating in // bars with loftstrand crutch and BTB hip F and knee F assist x4 minutes    Ambulating in gym with loftstrand and BTB hip F and knee F assist x100 ft     Wife educated on how to apply BTB Hip F  Standing LE extension 1x10 L and R BUE support   Standing LE hamstring curl 1x10 L and R BUE support   Seated adduction and ball squeeze between feet with LAQ 10x                      PT Education - 01/07/18 1401    Education provided  Yes    Education Details  exercise technique, BTB hip F application    Person(s) Educated  Patient;Spouse    Methods  Explanation;Demonstration;Verbal cues    Comprehension  Verbalized understanding;Returned demonstration       PT Short Term Goals - 12/09/17 1831      PT SHORT TERM GOAL #1   Title  Patient will increase BLE gross  strength to 3+/5 as to improve functional strength for independent gait, increased standing tolerance and increased ADL ability.    Baseline  Gross 3/5    Time  2    Period  Weeks    Status  New    Target Date  12/23/17      PT SHORT TERM GOAL #2  Title  Patient will improve TUG to <14 seconds to improve mobility and decrease fall risk.     Baseline  3/6 19 seconds    Time  2    Period  Weeks    Status  New    Target Date  12/23/17      PT SHORT TERM GOAL #3   Title  Patient will be independent in home exercise program to improve strength/mobility for better functional independence with ADLs.    Baseline  HEP given    Time  2    Period  Weeks    Status  New    Target Date  12/23/17        PT Long Term Goals - 12/09/17 1835      PT LONG TERM GOAL #1   Title  Patient will increase BLE gross strength to 4+/5 as to improve functional strength for independent gait, increased standing tolerance and increased ADL ability.    Baseline  gross 3/5    Time  8    Period  Weeks    Status  New    Target Date  02/03/18      PT LONG TERM GOAL #2   Title  Patient will increase six minute walk test distance to >1000 for progression to community ambulator and improve gait ability    Baseline  810 ft    Time  8    Period  Weeks    Status  New    Target Date  02/03/18      PT LONG TERM GOAL #3   Title  Patient will increase lower extremity functional scale to >60/80 to demonstrate improved functional mobility and increased tolerance with ADLs.     Baseline  25/80    Time  8    Period  Weeks    Status  New    Target Date  02/03/18      PT LONG TERM GOAL #4   Title  Patient will reduce modified Oswestry score to <20 as to demonstrate minimal disability with ADLs including improved sleeping tolerance, walking/sitting tolerance etc for better mobility with ADLs.     Baseline  3/6/: 44%    Time  8    Period  Weeks    Status  New    Target Date  02/03/18            Plan -  01/07/18 1422    Clinical Impression Statement  Patient and wife educated on how to applied BTB Hip F to increase foot clearance with ambulation and dynamic interventions. Patient performed over half session with BTB on with focus on improving body mechanics. Patient will continue to benefit from skilled physical therapy interventions for improved balance, strength, and safety.     Rehab Potential  Fair    Clinical Impairments Affecting Rehab Potential  (+) good compliance, family support (-) history of multiple surgeries, progressive weakness     PT Frequency  2x / week    PT Duration  8 weeks    PT Treatment/Interventions  ADLs/Self Care Home Management;Aquatic Therapy;Cryotherapy;Ultrasound;Traction;Moist Heat;Electrical Stimulation;DME Instruction;Gait training;Stair training;Balance training;Therapeutic exercise;Therapeutic activities;Functional mobility training;Neuromuscular re-education;Patient/family education;Orthotic Fit/Training;Manual techniques;Passive range of motion;Energy conservation;Taping    PT Next Visit Plan  review HEP, progress balance and LE strengthening    PT Home Exercise Plan  see sheet    Consulted and Agree with Plan of Care  Patient       Patient will benefit from skilled therapeutic intervention in  order to improve the following deficits and impairments:  Abnormal gait, Decreased activity tolerance, Decreased coordination, Decreased balance, Decreased endurance, Decreased knowledge of precautions, Decreased mobility, Decreased strength, Difficulty walking, Impaired flexibility, Impaired perceived functional ability, Postural dysfunction, Improper body mechanics, Pain  Visit Diagnosis: Muscle weakness (generalized)  Other abnormalities of gait and mobility  History of falling     Problem List Patient Active Problem List   Diagnosis Date Noted  . Gait abnormality 03/27/2017  . Pressure injury of skin 11/08/2016  . Lumbar radiculopathy, chronic 11/07/2016   . 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 Renal carcinoma ([place])   . 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 Calculus of kidney 02/09/2014  . Nephrolithiasis   . High cholesterol    Janna Arch, PT, DPT   Janna Arch 01/07/2018, 2:32 PM  [place] MAIN [facility] SERVICES [address] [place], [place], [address] Phone: [phone]   Fax:  [phone]  Name: NICKALOUS STINGLEY MRN: [id number] Date of Birth: 03-15-1946

## 2018-01-08 DIAGNOSIS — J40 Bronchitis, not specified as acute or chronic: Secondary | ICD-10-CM | POA: Diagnosis not present

## 2018-01-12 ENCOUNTER — Ambulatory Visit: Payer: PPO

## 2018-01-12 DIAGNOSIS — Z9181 History of falling: Secondary | ICD-10-CM

## 2018-01-12 DIAGNOSIS — R2689 Other abnormalities of gait and mobility: Secondary | ICD-10-CM

## 2018-01-12 DIAGNOSIS — M6281 Muscle weakness (generalized): Secondary | ICD-10-CM | POA: Diagnosis not present

## 2018-01-12 NOTE — Therapy (Signed)
[place] MAIN [facility] SERVICES [address] [place], [place], [address] Phone: [phone]   Fax:  [phone]  Physical Therapy Treatment  Patient Details  Name: Bryan Frey MRN: [id number] Date of Birth: [birth year] Referring Provider: Earleen Newport, MD   Encounter Date: 01/12/2018  PT End of Session - 01/12/18 1435    Visit Number  7    Number of Visits  16    Date for PT Re-Evaluation  02/03/18    PT Start Time  7902    PT Stop Time  1500    PT Time Calculation (min)  45 min    Equipment Utilized During Treatment  Gait belt    Activity Tolerance  Patient tolerated treatment well;Patient limited by fatigue;Treatment limited secondary to medical complications (Comment)    Behavior During Therapy  Bryan Frey for tasks assessed/performed       Past Medical History:  Diagnosis Date  . Anxiety    with diagnosis  . 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 Depression   . Gait abnormality 03/27/2017  . Gall stones   . High cholesterol   . History of kidney stones   . HOH (hard of hearing)   . Hypertension    EKG,  chest  4/13 EPIC  . 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  . Nephrolithiasis   . Neuromuscular disorder (HCC)    facial nerve pain  . Stroke [facility])    TIA    Past Surgical History:  Procedure Laterality Date  . BACK Frey    . CATARACT EXTRACTION W/PHACO Left 12/24/2017   Procedure: CATARACT EXTRACTION PHACO AND INTRAOCULAR LENS PLACEMENT (IOC);  Surgeon: Bryan Bear, MD;  Location: [facility] ORS;  Service: Ophthalmology;  Laterality: Left;  Lot # [id number] H [place]: 00:30.4 AP%: 9.4 CDE: 2.84  . COLONOSCOPY    . IR RADIOLOGIST EVAL & MGMT  03/18/2017  . JOINT REPLACEMENT    . Left total hip arthroplasty  08/07/2010  . LUMBAR LAMINECTOMY/DECOMPRESSION MICRODISCECTOMY  02/09/2012   Procedure: LUMBAR LAMINECTOMY/DECOMPRESSION MICRODISCECTOMY 2 LEVELS;  Surgeon: Bryan Miss, MD;  Location: [place] NEURO ORS;  Service: Neurosurgery;   Laterality: Bilateral;  Bilateral Lumbar two-three,lumbar four-five laminectomies  . NEPHROLITHOTOMY Right 02/09/2014   Procedure: NEPHROLITHOTOMY PERCUTANEOUS;  Surgeon: Bryan Gallo, MD;  Location: [facility];  Service: Urology;  Laterality: Right;  . Right total hip arthroplasty  07/13/2014  . TOTAL HIP REVISION Left 05/14/2015   Procedure: TOTAL HIP REVISION;  Surgeon: Bryan Leep, MD;  Location: [facility] ORS;  Service: Orthopedics;  Laterality: Left;    There were no vitals filed for this visit.  Subjective Assessment - 01/12/18 1419    Subjective  Patient has not been active since the last session due to being sick. Is feeling slightly better but still fatigued.     Pertinent History  [name] is a 72 year old right-handed white male with a history of chronic low back pain and leg discomfort associated with weightbearing. He has had prior lumbosacral spine Frey, he has cervical spondylosis without spinal cord compression. 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 He has had an extensive workup that has included MRI evaluation of the entire neuro axis including the brain, cervical spine, thoracic spine, and lumbar spine. The patient walks with a Canadian crutch, he has stumbled or fallen on occasion. He does have some urgency of the  bladder, he may have incontinence if he cannot get to the bathroom in time. EMG and nerve conduction study have been done previously showing evidence of chronic L5 and S1 radiculopathy on the right leg. 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  He has no evidence of a myelopathy by MRI of the cervical or thoracic spine. He has had extensive Frey of the lumbar spine with chronic back pain and mild radiculopathy findings by EMG.  Patient had L2-L5 lumbar fusion. Found that his left leg is 3/4 inch shorter than his right leg and creating a gait abnormality, bothering his back. Wears a lift  in L shoe. Sometimes gets stabbing pain in L thigh and can't pick up foot. Will be going on a cruise in June to [place] for 73th anniversary     Limitations  Sitting;Lifting;Standing;Walking;Writing    How long can you sit comfortably?  30 minutes    How long can you stand comfortably?  with canadian crutch 20 minutes    How long can you walk comfortably?  with canadian crutch 5 minutes    Patient Stated Goals  to improve walking, leg strength, be able to go to cruise in June to [place]     Currently in Pain?  Yes    Pain Score  9     Pain Location  Back    Pain Orientation  Lower    Pain Descriptors / Indicators  Aching    Pain Type  Acute pain    Pain Onset  More than a month ago         Marching with high knees on Airex pad in // bars with SUE support 12x each leg : With BTB 12x     Airex pad: Rhomberg stance horizontal and vertically head turns 1x60 seconds with BTB Hip F  Ambulate 96 ft with BTB Hip F with good foot clearance and posture.   Step over and back orange hurdle with BTB hip F 12x each leg; BLE  Side step over and back orange hurdle with BTB hip F 12x each leg, BLE   Agility ladder in // bars: one foot in each box 6x length of bars BYB hip F     Ambulating in // bars BTB hip F and knee F assist x2 lengths of bars   Ambulating in hallway reading cards with horizontal head turns and BTB Hip F   Standing LE extension 1x10 L and R BUE support    Standing LE hamstring curl 1x10 L and R BUE support    Seated adduction and ball squeeze between feet with LAQ 10x  seated adduction ball squeeze 10x 5 seconds.                     PT Education - 01/12/18 1431    Education provided  Yes    Education Details  exercise technique     Person(s) Educated  Patient    Methods  Explanation;Demonstration;Verbal cues    Comprehension  Verbalized understanding;Returned demonstration       PT Short Term Goals - 12/09/17 1831      PT SHORT TERM GOAL #1    Title  Patient will increase BLE gross strength to 3+/5 as to improve functional strength for independent gait, increased standing tolerance and increased ADL ability.    Baseline  Gross 3/5    Time  2    Period  Weeks    Status  New  Target Date  12/23/17      PT SHORT TERM GOAL #2   Title  Patient will improve TUG to <14 seconds to improve mobility and decrease fall risk.     Baseline  3/6 19 seconds    Time  2    Period  Weeks    Status  New    Target Date  12/23/17      PT SHORT TERM GOAL #3   Title  Patient will be independent in home exercise program to improve strength/mobility for better functional independence with ADLs.    Baseline  HEP given    Time  2    Period  Weeks    Status  New    Target Date  12/23/17        PT Long Term Goals - 12/09/17 1835      PT LONG TERM GOAL #1   Title  Patient will increase BLE gross strength to 4+/5 as to improve functional strength for independent gait, increased standing tolerance and increased ADL ability.    Baseline  gross 3/5    Time  8    Period  Weeks    Status  New    Target Date  02/03/18      PT LONG TERM GOAL #2   Title  Patient will increase six minute walk test distance to >1000 for progression to community ambulator and improve gait ability    Baseline  810 ft    Time  8    Period  Weeks    Status  New    Target Date  02/03/18      PT LONG TERM GOAL #3   Title  Patient will increase lower extremity functional scale to >60/80 to demonstrate improved functional mobility and increased tolerance with ADLs.     Baseline  25/80    Time  8    Period  Weeks    Status  New    Target Date  02/03/18      PT LONG TERM GOAL #4   Title  Patient will reduce modified Oswestry score to <20 as to demonstrate minimal disability with ADLs including improved sleeping tolerance, walking/sitting tolerance etc for better mobility with ADLs.     Baseline  3/6/: 44%    Time  8    Period  Weeks    Status  New    Target Date   02/03/18            Plan - 01/12/18 1443    Clinical Impression Statement  Patient presents with fatigue due to recent illness. Requires additional seated breaks due to fatigue and occasional dizziness. Utilization of BTB Hip F and speed ladder induced improved gait mechanics with improved hip and foot clearance resulting in improved step length. Patient will continue to benefit from skilled physical therapy for improved balance, strength, and safety.     Rehab Potential  Fair    Clinical Impairments Affecting Rehab Potential  (+) good compliance, family support (-) history of multiple surgeries, progressive weakness     PT Frequency  2x / week    PT Duration  8 weeks    PT Treatment/Interventions  ADLs/Self Care Home Management;Aquatic Therapy;Cryotherapy;Ultrasound;Traction;Moist Heat;Electrical Stimulation;DME Instruction;Gait training;Stair training;Balance training;Therapeutic exercise;Therapeutic activities;Functional mobility training;Neuromuscular re-education;Patient/family education;Orthotic Fit/Training;Manual techniques;Passive range of motion;Energy conservation;Taping    PT Next Visit Plan  review HEP, progress balance and LE strengthening    PT Home Exercise Plan  see sheet    Consulted  and Agree with Plan of Care  Patient       Patient will benefit from skilled therapeutic intervention in order to improve the following deficits and impairments:  Abnormal gait, Decreased activity tolerance, Decreased coordination, Decreased balance, Decreased endurance, Decreased knowledge of precautions, Decreased mobility, Decreased strength, Difficulty walking, Impaired flexibility, Impaired perceived functional ability, Postural dysfunction, Improper body mechanics, Pain  Visit Diagnosis: Muscle weakness (generalized)  Other abnormalities of gait and mobility  History of falling     Problem List Patient Active Problem List   Diagnosis Date Noted  . Gait abnormality 03/27/2017   . Pressure injury of skin 11/08/2016  . Lumbar radiculopathy, chronic 11/07/2016  . 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 Renal carcinoma ([place])   . 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 Calculus of kidney 02/09/2014  . Nephrolithiasis   . High cholesterol    Janna Arch, PT, DPT   Janna Arch 01/12/2018, 2:58 PM  [place] MAIN [facility] SERVICES [address] [place], [place], [address] Phone: [phone]   Fax:  [phone]  Name: Bryan Frey MRN: [id number] Date of Birth: 22-Jun-1946

## 2018-01-13 DIAGNOSIS — H2511 Age-related nuclear cataract, right eye: Secondary | ICD-10-CM | POA: Diagnosis not present

## 2018-01-14 ENCOUNTER — Ambulatory Visit: Payer: PPO

## 2018-01-15 ENCOUNTER — Ambulatory Visit: Payer: PPO

## 2018-01-15 DIAGNOSIS — M6281 Muscle weakness (generalized): Secondary | ICD-10-CM | POA: Diagnosis not present

## 2018-01-15 DIAGNOSIS — R2689 Other abnormalities of gait and mobility: Secondary | ICD-10-CM

## 2018-01-15 DIAGNOSIS — Z9181 History of falling: Secondary | ICD-10-CM

## 2018-01-15 NOTE — Therapy (Signed)
[place] MAIN [facility] SERVICES [address] [place], [place], [address] Phone: [phone]   Fax:  [phone]  Physical Therapy Treatment  Patient Details  Name: Bryan Frey MRN: [id number] Date of Birth: [birth year] Referring Provider: Earleen Newport, MD   Encounter Date: 01/15/2018  PT End of Session - 01/15/18 0949    Visit Number  8    Number of Visits  16    Date for PT Re-Evaluation  02/03/18    PT Start Time  0928    PT Stop Time  1015    PT Time Calculation (min)  47 min    Equipment Utilized During Treatment  Gait belt    Activity Tolerance  Patient tolerated treatment well;Patient limited by fatigue;Treatment limited secondary to medical complications (Comment)    Behavior During Therapy  [facility] for tasks assessed/performed       Past Medical History:  Diagnosis Date  . Anxiety    with diagnosis  . 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 Depression   . Gait abnormality 03/27/2017  . Gall stones   . High cholesterol   . History of kidney stones   . HOH (hard of hearing)   . Hypertension    EKG,  chest  4/13 EPIC  . 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  . Nephrolithiasis   . Neuromuscular disorder (HCC)    facial nerve pain  . Stroke [facility])    TIA    Past Surgical History:  Procedure Laterality Date  . BACK SURGERY    . CATARACT EXTRACTION W/PHACO Left 12/24/2017   Procedure: CATARACT EXTRACTION PHACO AND INTRAOCULAR LENS PLACEMENT (IOC);  Surgeon: Bryan Bear, MD;  Location: [facility] ORS;  Service: Ophthalmology;  Laterality: Left;  Lot # [id number] H [place]: 00:30.4 AP%: 9.4 CDE: 2.84  . COLONOSCOPY    . IR RADIOLOGIST EVAL & MGMT  03/18/2017  . JOINT REPLACEMENT    . Left total hip arthroplasty  08/07/2010  . LUMBAR LAMINECTOMY/DECOMPRESSION MICRODISCECTOMY  02/09/2012   Procedure: LUMBAR LAMINECTOMY/DECOMPRESSION MICRODISCECTOMY 2 LEVELS;  Surgeon: Bryan Miss, MD;  Location: [place] NEURO ORS;  Service: Neurosurgery;   Laterality: Bilateral;  Bilateral Lumbar two-three,lumbar four-five laminectomies  . NEPHROLITHOTOMY Right 02/09/2014   Procedure: NEPHROLITHOTOMY PERCUTANEOUS;  Surgeon: Bryan Gallo, MD;  Location: [facility]S;  Service: Urology;  Laterality: Right;  . Right total hip arthroplasty  07/13/2014  . TOTAL HIP REVISION Left 05/14/2015   Procedure: TOTAL HIP REVISION;  Surgeon: Bryan Leep, MD;  Location: [facility] ORS;  Service: Orthopedics;  Laterality: Left;    There were no vitals filed for this visit.  Subjective Assessment - 01/15/18 0930    Subjective  Patient reports no falls but a few stumbles since last session. Is starting to feel better. Compliant with HEP.     Pertinent History  [name] is a 72 year old right-handed white male with a history of chronic low back pain and leg discomfort associated with weightbearing. He has had prior lumbosacral spine surgery, he has cervical spondylosis without spinal cord compression. 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 He has had an extensive workup that has included MRI evaluation of the entire neuro axis including the brain, cervical spine, thoracic spine, and lumbar spine. The patient walks with a Canadian crutch, he has stumbled or fallen on occasion. He does have some urgency of the bladder,  he may have incontinence if he cannot get to the bathroom in time. EMG and nerve conduction study have been done previously showing evidence of chronic L5 and S1 radiculopathy on the right leg. 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  He has no evidence of a myelopathy by MRI of the cervical or thoracic spine. He has had extensive surgery of the lumbar spine with chronic back pain and mild radiculopathy findings by EMG.  Patient had L2-L5 lumbar fusion. Found that his left leg is 3/4 inch shorter than his right leg and creating a gait abnormality, bothering his back. Wears a lift in L  shoe. Sometimes gets stabbing pain in L thigh and can't pick up foot. Will be going on a cruise in June to [place] for 50th anniversary     Limitations  Sitting;Lifting;Standing;Walking;Writing    How long can you sit comfortably?  30 minutes    How long can you stand comfortably?  with canadian crutch 20 minutes    How long can you walk comfortably?  with canadian crutch 5 minutes    Patient Stated Goals  to improve walking, leg strength, be able to go to cruise in June to [place]     Currently in Pain?  Yes    Pain Score  1     Pain Location  Back    Pain Orientation  Lower    Pain Descriptors / Indicators  Aching    Pain Type  Acute pain    Pain Onset  More than a month ago       Monster walks with RTB and BTB Hip F 6x length of // bars: forwards   Side step over half foam rollers with BTB Hip F (two consecutive in a row) in // bars 8x with decreasing UE support (two hands to one hand)   Forward step over half foam rollers with BTB Hip F (two consecutive in a row) in // bars 10x with decreasing UE support   SUE support mini knobby bosu toe taps with PT directing color and leg for patient. 3 minutes   Step over and back orange hurdle with BTB hip F 12x each leg; BLE     Ambulating in // bars BTB hip F and knee F assist x2 lengths of bars   Football throw in // bars to promote balance inside and outside BOS reaches    seated LE extension 10x 3 second holds  BLE                              PT Education - 01/15/18 0948    Education provided  Yes    Education Details  exercise technique     Person(s) Educated  Patient    Methods  Explanation;Demonstration;Verbal cues    Comprehension  Returned demonstration;Verbalized understanding       PT Short Term Goals - 12/09/17 1831      PT SHORT TERM GOAL #1   Title  Patient will increase BLE gross strength to 3+/5 as to improve functional strength for independent gait, increased standing tolerance and  increased ADL ability.    Baseline  Gross 3/5    Time  2    Period  Weeks    Status  New    Target Date  12/23/17      PT SHORT TERM GOAL #2   Title  Patient will improve TUG to <14 seconds to improve  mobility and decrease fall risk.     Baseline  3/6 19 seconds    Time  2    Period  Weeks    Status  New    Target Date  12/23/17      PT SHORT TERM GOAL #3   Title  Patient will be independent in home exercise program to improve strength/mobility for better functional independence with ADLs.    Baseline  HEP given    Time  2    Period  Weeks    Status  New    Target Date  12/23/17        PT Long Term Goals - 12/09/17 1835      PT LONG TERM GOAL #1   Title  Patient will increase BLE gross strength to 4+/5 as to improve functional strength for independent gait, increased standing tolerance and increased ADL ability.    Baseline  gross 3/5    Time  8    Period  Weeks    Status  New    Target Date  02/03/18      PT LONG TERM GOAL #2   Title  Patient will increase six minute walk test distance to >1000 for progression to community ambulator and improve gait ability    Baseline  810 ft    Time  8    Period  Weeks    Status  New    Target Date  02/03/18      PT LONG TERM GOAL #3   Title  Patient will increase lower extremity functional scale to >60/80 to demonstrate improved functional mobility and increased tolerance with ADLs.     Baseline  25/80    Time  8    Period  Weeks    Status  New    Target Date  02/03/18      PT LONG TERM GOAL #4   Title  Patient will reduce modified Oswestry score to <20 as to demonstrate minimal disability with ADLs including improved sleeping tolerance, walking/sitting tolerance etc for better mobility with ADLs.     Baseline  3/6/: 44%    Time  8    Period  Weeks    Status  New    Target Date  02/03/18            Plan - 01/15/18 1015    Clinical Impression Statement  Patient performing improved clearance of RLE with  utilization of BTB Hip F for side stepping and forward stepping. Patient challenged with prolonged standing durations requiring seated rest breaks due to difficulty breathing from recent illness. Static balance performed with patient demonstrating ability to reach within and outside of BOS without LOB when throwing football. Patient will continue to benefit from skilled physical therapy for improved balance, strength, and safety.     Rehab Potential  Fair    Clinical Impairments Affecting Rehab Potential  (+) good compliance, family support (-) history of multiple surgeries, progressive weakness     PT Frequency  2x / week    PT Duration  8 weeks    PT Treatment/Interventions  ADLs/Self Care Home Management;Aquatic Therapy;Cryotherapy;Ultrasound;Traction;Moist Heat;Electrical Stimulation;DME Instruction;Gait training;Stair training;Balance training;Therapeutic exercise;Therapeutic activities;Functional mobility training;Neuromuscular re-education;Patient/family education;Orthotic Fit/Training;Manual techniques;Passive range of motion;Energy conservation;Taping    PT Next Visit Plan  review HEP, progress balance and LE strengthening    PT Home Exercise Plan  see sheet    Consulted and Agree with Plan of Care  Patient  Patient will benefit from skilled therapeutic intervention in order to improve the following deficits and impairments:  Abnormal gait, Decreased activity tolerance, Decreased coordination, Decreased balance, Decreased endurance, Decreased knowledge of precautions, Decreased mobility, Decreased strength, Difficulty walking, Impaired flexibility, Impaired perceived functional ability, Postural dysfunction, Improper body mechanics, Pain  Visit Diagnosis: Muscle weakness (generalized)  Other abnormalities of gait and mobility  History of falling     Problem List Patient Active Problem List   Diagnosis Date Noted  . Gait abnormality 03/27/2017  . Pressure injury of skin  11/08/2016  . Lumbar radiculopathy, chronic 11/07/2016  . 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 Renal carcinoma ([place])   . 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 Calculus of kidney 02/09/2014  . Nephrolithiasis   . High cholesterol    Janna Arch, PT, DPT   01/15/2018, 10:16 AM  [place] MAIN [facility] SERVICES [address] [place], [place], [address] Phone: [phone]   Fax:  [phone]  Name: Bryan Frey MRN: [id number] Date of Birth: October 13, 1945

## 2018-01-19 ENCOUNTER — Ambulatory Visit: Payer: PPO

## 2018-01-19 ENCOUNTER — Encounter: Payer: Self-pay | Admitting: *Deleted

## 2018-01-19 ENCOUNTER — Other Ambulatory Visit: Payer: Self-pay

## 2018-01-19 DIAGNOSIS — Z9181 History of falling: Secondary | ICD-10-CM

## 2018-01-19 DIAGNOSIS — M6281 Muscle weakness (generalized): Secondary | ICD-10-CM | POA: Diagnosis not present

## 2018-01-19 DIAGNOSIS — R2689 Other abnormalities of gait and mobility: Secondary | ICD-10-CM

## 2018-01-19 NOTE — Therapy (Signed)
[place] MAIN [facility] SERVICES [address] [place], [place], [address] Phone: [phone]   Fax:  [phone]  Physical Therapy Treatment  Patient Details  Name: Bryan Frey MRN: [id number] Date of Birth: 27-Mar-1946 Referring Provider: Earleen Newport, MD   Encounter Date: 01/19/2018  PT End of Session - 01/19/18 1357    Visit Number  9    Number of Visits  16    Date for PT Re-Evaluation  02/03/18    PT Start Time  2353    PT Stop Time  1429    PT Time Calculation (min)  45 min    Equipment Utilized During Treatment  Gait belt    Activity Tolerance  Patient tolerated treatment well;Patient limited by fatigue;Treatment limited secondary to medical complications (Comment)    Behavior During Therapy  [facility] for tasks assessed/performed       Past Medical History:  Diagnosis Date  . Anxiety    with diagnosis  . 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 Depression   . Gait abnormality 03/27/2017  . Gall stones   . High cholesterol   . History of kidney stones   . HOH (hard of hearing)   . Hypertension    EKG,  chest  4/13 EPIC  . 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  . Nephrolithiasis   . Neuromuscular disorder (HCC)    facial nerve pain  . Stroke [facility])    TIA    Past Surgical History:  Procedure Laterality Date  . BACK SURGERY    . CATARACT EXTRACTION W/PHACO Left 12/24/2017   Procedure: CATARACT EXTRACTION PHACO AND INTRAOCULAR LENS PLACEMENT (IOC);  Surgeon: Eulogio Bear, MD;  Location: [facility] ORS;  Service: Ophthalmology;  Laterality: Left;  Lot # [id number] H [place]: 00:30.4 AP%: 9.4 CDE: 2.84  . COLONOSCOPY    . IR RADIOLOGIST EVAL & MGMT  03/18/2017  . JOINT REPLACEMENT    . Left total hip arthroplasty  08/07/2010  . LUMBAR LAMINECTOMY/DECOMPRESSION MICRODISCECTOMY  02/09/2012   Procedure: LUMBAR LAMINECTOMY/DECOMPRESSION MICRODISCECTOMY 2 LEVELS;  Surgeon: Kristeen Miss, MD;  Location: [facility] NEURO ORS;  Service: Neurosurgery;   Laterality: Bilateral;  Bilateral Lumbar two-three,lumbar four-five laminectomies  . NEPHROLITHOTOMY Right 02/09/2014   Procedure: NEPHROLITHOTOMY PERCUTANEOUS;  Surgeon: Franchot Gallo, MD;  Location: [facility];  Service: Urology;  Laterality: Right;  . Right total hip arthroplasty  07/13/2014  . TOTAL HIP REVISION Left 05/14/2015   Procedure: TOTAL HIP REVISION;  Surgeon: Dereck Leep, MD;  Location: [facility] ORS;  Service: Orthopedics;  Laterality: Left;    There were no vitals filed for this visit.  Subjective Assessment - 01/19/18 1352    Subjective  Patient reports mowing lawn yesterday. No stumbles or falls. no increase in pain.     Pertinent History  Bryan Frey is a 72 year old right-handed white male with a history of chronic low back pain and leg discomfort associated with weightbearing. He has had prior lumbosacral spine surgery, he has cervical spondylosis without spinal cord compression. 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 He has had an extensive workup that has included MRI evaluation of the entire neuro axis including the brain, cervical spine, thoracic spine, and lumbar spine. The patient walks with a [place] crutch, he has stumbled or fallen on occasion. He does have some urgency of the bladder, he may have incontinence if he  cannot get to the bathroom in time. EMG and nerve conduction study have been done previously showing evidence of chronic L5 and S1 radiculopathy on the right leg. 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  He has no evidence of a myelopathy by MRI of the cervical or thoracic spine. He has had extensive surgery of the lumbar spine with chronic back pain and mild radiculopathy findings by EMG.  Patient had L2-L5 lumbar fusion. Found that his left leg is 3/4 inch shorter than his right leg and creating a gait abnormality, bothering his back. Wears a lift in L shoe. Sometimes gets stabbing  pain in L thigh and can't pick up foot. Will be going on a cruise in June to [place] for 50th anniversary     Limitations  Sitting;Lifting;Standing;Walking;Writing    How long can you sit comfortably?  30 minutes    How long can you stand comfortably?  with canadian crutch 20 minutes    How long can you walk comfortably?  with canadian crutch 5 minutes    Patient Stated Goals  to improve walking, leg strength, be able to go to cruise in June to [place]     Currently in Pain?  Yes    Pain Score  1     Pain Location  Back    Pain Orientation  Lower    Pain Descriptors / Indicators  Aching    Pain Type  Acute pain    Pain Onset  More than a month ago    Pain Frequency  Intermittent       Monster walks with RTB and BTB Hip F 6x length of // bars: forwards    Side step over half foam rollers with BTB Hip F (two consecutive in a row) in // bars x with decreasing UE support  SUE 10x   Forward step over half foam rollers with BTB Hip F (two consecutive in a row) in // bars 10x with decreasing UE support    Ambulate in hallway with BTB Hip F reading cards with horizontal head turns 200 ft  6" toe taps SUE support 20x. ; terminated due to pain with L anterior hip.   SUE support mini knobby bosu toe taps with PT directing color and leg for patient. 3 minutes    Step over and back orange hurdle with BTB hip F 15x each leg; BLE     Ambulating in // bars BTB hip F and knee F assist x2 lengths of bars   Football throw in // bars to promote balance inside and outside BOS reaches    Seated hip flexion marches 10x with BTB Hip F   Seated LAQ 5 second holds 10x each leg.                       PT Education - 01/19/18 1357    Education provided  Yes    Education Details  exercise technique    Person(s) Educated  Patient    Methods  Explanation;Demonstration;Verbal cues    Comprehension  Verbalized understanding;Returned demonstration       PT Short Term Goals - 12/09/17  1831      PT SHORT TERM GOAL #1   Title  Patient will increase BLE gross strength to 3+/5 as to improve functional strength for independent gait, increased standing tolerance and increased ADL ability.    Baseline  Gross 3/5    Time  2    Period  Weeks    Status  New    Target Date  12/23/17      PT SHORT TERM GOAL #2   Title  Patient will improve TUG to <14 seconds to improve mobility and decrease fall risk.     Baseline  3/6 19 seconds    Time  2    Period  Weeks    Status  New    Target Date  12/23/17      PT SHORT TERM GOAL #3   Title  Patient will be independent in home exercise program to improve strength/mobility for better functional independence with ADLs.    Baseline  HEP given    Time  2    Period  Weeks    Status  New    Target Date  12/23/17        PT Long Term Goals - 12/09/17 1835      PT LONG TERM GOAL #1   Title  Patient will increase BLE gross strength to 4+/5 as to improve functional strength for independent gait, increased standing tolerance and increased ADL ability.    Baseline  gross 3/5    Time  8    Period  Weeks    Status  New    Target Date  02/03/18      PT LONG TERM GOAL #2   Title  Patient will increase six minute walk test distance to >1000 for progression to community ambulator and improve gait ability    Baseline  810 ft    Time  8    Period  Weeks    Status  New    Target Date  02/03/18      PT LONG TERM GOAL #3   Title  Patient will increase lower extremity functional scale to >60/80 to demonstrate improved functional mobility and increased tolerance with ADLs.     Baseline  25/80    Time  8    Period  Weeks    Status  New    Target Date  02/03/18      PT LONG TERM GOAL #4   Title  Patient will reduce modified Oswestry score to <20 as to demonstrate minimal disability with ADLs including improved sleeping tolerance, walking/sitting tolerance etc for better mobility with ADLs.     Baseline  3/6/: 44%    Time  8    Period   Weeks    Status  New    Target Date  02/03/18            Plan - 01/19/18 1406    Clinical Impression Statement  Patient demonstrated improved standing capacity increasing number of reps per set of stepping in // bars. Patient fatigues with repeated flexion of RLE prior to LLE.  Static balance continues to be good within and outside BOS. Patient will continue to benefit from skilled physical therapy for improved balance, strength, and safety.     Rehab Potential  Fair    Clinical Impairments Affecting Rehab Potential  (+) good compliance, family support (-) history of multiple surgeries, progressive weakness     PT Frequency  2x / week    PT Duration  8 weeks    PT Treatment/Interventions  ADLs/Self Care Home Management;Aquatic Therapy;Cryotherapy;Ultrasound;Traction;Moist Heat;Electrical Stimulation;DME Instruction;Gait training;Stair training;Balance training;Therapeutic exercise;Therapeutic activities;Functional mobility training;Neuromuscular re-education;Patient/family education;Orthotic Fit/Training;Manual techniques;Passive range of motion;Energy conservation;Taping    PT Next Visit Plan  review HEP, progress balance and LE strengthening    PT Home Exercise Plan  see sheet    Consulted and Agree with Plan of Care  Patient       Patient will benefit from skilled therapeutic intervention in order to improve the following deficits and impairments:  Abnormal gait, Decreased activity tolerance, Decreased coordination, Decreased balance, Decreased endurance, Decreased knowledge of precautions, Decreased mobility, Decreased strength, Difficulty walking, Impaired flexibility, Impaired perceived functional ability, Postural dysfunction, Improper body mechanics, Pain  Visit Diagnosis: Muscle weakness (generalized)  Other abnormalities of gait and mobility  History of falling     Problem List Patient Active Problem List   Diagnosis Date Noted  . Gait abnormality 03/27/2017  .  Pressure injury of skin 11/08/2016  . Lumbar radiculopathy, chronic 11/07/2016  . 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 Renal carcinoma ([place])   . 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 Calculus of kidney 02/09/2014  . Nephrolithiasis   . High cholesterol    Janna Arch, PT, DPT   01/19/2018, 2:29 PM  [place] MAIN [facility] SERVICES [address] [place], [place], [address] Phone: [phone]   Fax:  [phone]  Name: QUANTEZ SCHNYDER MRN: [id number] Date of Birth: Oct 14, 1945

## 2018-01-21 ENCOUNTER — Ambulatory Visit: Payer: PPO

## 2018-01-21 DIAGNOSIS — Z9181 History of falling: Secondary | ICD-10-CM

## 2018-01-21 DIAGNOSIS — M6281 Muscle weakness (generalized): Secondary | ICD-10-CM | POA: Diagnosis not present

## 2018-01-21 DIAGNOSIS — R2689 Other abnormalities of gait and mobility: Secondary | ICD-10-CM

## 2018-01-21 NOTE — Therapy (Signed)
[place] MAIN [facility] SERVICES [address] [place], [place], [address] Phone: [phone]   Fax:  [phone]  Physical Therapy Treatment  Patient Details  Name: Bryan Frey MRN: [id number] Date of Birth: 1946-09-01 Referring Provider: Earleen Newport, MD   Encounter Date: 01/21/2018  PT End of Session - 01/21/18 1425    Visit Number  10    Number of Visits  16    Date for PT Re-Evaluation  02/03/18    PT Start Time  5188    PT Stop Time  1429    PT Time Calculation (min)  42 min    Equipment Utilized During Treatment  Gait belt    Activity Tolerance  Patient tolerated treatment well;Patient limited by fatigue;Treatment limited secondary to medical complications (Comment)    Behavior During Therapy  [facility] for tasks assessed/performed       Past Medical History:  Diagnosis Date  . Anxiety    with diagnosis  . 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 Depression   . Gait abnormality 03/27/2017  . Gall stones   . High cholesterol   . History of kidney stones   . HOH (hard of hearing)   . Hypertension    EKG,  chest  4/13 EPIC  . 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  . Nephrolithiasis   . Neuromuscular disorder (HCC)    facial nerve pain  . Stroke [facility])    TIA    Past Surgical History:  Procedure Laterality Date  . BACK SURGERY    . CATARACT EXTRACTION W/PHACO Left 12/24/2017   Procedure: CATARACT EXTRACTION PHACO AND INTRAOCULAR LENS PLACEMENT (IOC);  Surgeon: Bryan Bear, MD;  Location: [facility] ORS;  Service: Ophthalmology;  Laterality: Left;  Lot # [id number] H [place]: 00:30.4 AP%: 9.4 CDE: 2.84  . COLONOSCOPY    . IR RADIOLOGIST EVAL & MGMT  03/18/2017  . JOINT REPLACEMENT    . Left total hip arthroplasty  08/07/2010  . LUMBAR LAMINECTOMY/DECOMPRESSION MICRODISCECTOMY  02/09/2012   Procedure: LUMBAR LAMINECTOMY/DECOMPRESSION MICRODISCECTOMY 2 LEVELS;  Surgeon: Bryan Miss, MD;  Location: [place] [facility] ORS;  Service: Neurosurgery;   Laterality: Bilateral;  Bilateral Lumbar two-three,lumbar four-five laminectomies  . NEPHROLITHOTOMY Right 02/09/2014   Procedure: NEPHROLITHOTOMY PERCUTANEOUS;  Surgeon: Bryan Gallo, MD;  Location: [facility] ORS;  Service: Urology;  Laterality: Right;  . Right total hip arthroplasty  07/13/2014  . TOTAL HIP REVISION Left 05/14/2015   Procedure: TOTAL HIP REVISION;  Surgeon: Bryan Leep, MD;  Location: [facility] ORS;  Service: Orthopedics;  Laterality: Left;    There were no vitals filed for this visit.  Subjective Assessment - 01/21/18 1351    Subjective  Patient reports no falls or LOB. Pain continues to stay the same. Patient is going to get a new recliner for his birthday.     Pertinent History  Bryan Frey is a 72 year old right-handed white male with a history of chronic low back pain and leg discomfort associated with weightbearing. He has had prior lumbosacral spine surgery, he has cervical spondylosis without spinal cord compression. 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 He has had an extensive workup that has included MRI evaluation of the entire neuro axis including the brain, cervical spine, thoracic spine, and lumbar spine. The patient walks with a Canadian crutch, he has stumbled or fallen on occasion. He does have some  urgency of the bladder, he may have incontinence if he cannot get to the bathroom in time. EMG and nerve conduction study have been done previously showing evidence of chronic L5 and S1 radiculopathy on the right leg. 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  He has no evidence of a myelopathy by MRI of the cervical or thoracic spine. He has had extensive surgery of the lumbar spine with chronic back pain and mild radiculopathy findings by EMG.  Patient had L2-L5 lumbar fusion. Found that his left leg is 3/4 inch shorter than his right leg and creating a gait abnormality, bothering his back. Wears  a lift in L shoe. Sometimes gets stabbing pain in L thigh and can't pick up foot. Will be going on a cruise in June to [place] for 50th anniversary     Limitations  Sitting;Lifting;Standing;Walking;Writing    How long can you sit comfortably?  30 minutes    How long can you stand comfortably?  with canadian crutch 20 minutes    How long can you walk comfortably?  with canadian crutch 5 minutes    Patient Stated Goals  to improve walking, leg strength, be able to go to cruise in June to [place]     Currently in Pain?  Yes    Pain Score  1     Pain Location  Back    Pain Orientation  Lower    Pain Descriptors / Indicators  Aching    Pain Type  Acute pain    Pain Onset  More than a month ago    Pain Frequency  Intermittent       Monster walks with RTB and BTB Hip F 6x length of // bars: forwards     Side stepping in // bars with RTB around toes for external rotation activation. x4 lengths of // bars. Challenging to patient to maintain ER  Seated ER with RTB 2x10   Forward step over half foam rollers with BTB Hip F (two consecutive in a row) in // bars 10x with decreasing UE support    Ambulate in hallway with BTB Hip F reading cards with horizontal head turns 200 ft   6" toe taps SUE support 20x. ; terminated due to pain with L anterior hip.    2" step eccentric step down BUE support      Ambulating in // bars BTB hip F and knee F assist x2 lengths of bars  Standing lunge stretch 3x20 seconds each leg                        PT Education - 01/21/18 1412    Education provided  Yes    Education Details  exercise technique     Person(s) Educated  Patient    Methods  Explanation;Demonstration;Verbal cues    Comprehension  Verbalized understanding;Verbal cues required;Returned demonstration;Need further instruction       PT Short Term Goals - 12/09/17 1831      PT SHORT TERM GOAL #1   Title  Patient will increase BLE gross strength to 3+/5 as to improve  functional strength for independent gait, increased standing tolerance and increased ADL ability.    Baseline  Gross 3/5    Time  2    Period  Weeks    Status  New    Target Date  12/23/17      PT SHORT TERM GOAL #2   Title  Patient will improve TUG  to <14 seconds to improve mobility and decrease fall risk.     Baseline  3/6 19 seconds    Time  2    Period  Weeks    Status  New    Target Date  12/23/17      PT SHORT TERM GOAL #3   Title  Patient will be independent in home exercise program to improve strength/mobility for better functional independence with ADLs.    Baseline  HEP given    Time  2    Period  Weeks    Status  New    Target Date  12/23/17        PT Long Term Goals - 12/09/17 1835      PT LONG TERM GOAL #1   Title  Patient will increase BLE gross strength to 4+/5 as to improve functional strength for independent gait, increased standing tolerance and increased ADL ability.    Baseline  gross 3/5    Time  8    Period  Weeks    Status  New    Target Date  02/03/18      PT LONG TERM GOAL #2   Title  Patient will increase six minute walk test distance to >1000 for progression to community ambulator and improve gait ability    Baseline  810 ft    Time  8    Period  Weeks    Status  New    Target Date  02/03/18      PT LONG TERM GOAL #3   Title  Patient will increase lower extremity functional scale to >60/80 to demonstrate improved functional mobility and increased tolerance with ADLs.     Baseline  25/80    Time  8    Period  Weeks    Status  New    Target Date  02/03/18      PT LONG TERM GOAL #4   Title  Patient will reduce modified Oswestry score to <20 as to demonstrate minimal disability with ADLs including improved sleeping tolerance, walking/sitting tolerance etc for better mobility with ADLs.     Baseline  3/6/: 44%    Time  8    Period  Weeks    Status  New    Target Date  02/03/18            Plan - 01/21/18 1426    Clinical  Impression Statement  Patient challenged with active ER of R and LLE resulting in excessive IR of LLE. Patient fatigued quickly upon activation of ER musculature requiring frequent rest breaks. Patient will continue to benefit from skilled physical therapy for improved balance, strength, and safety.     Rehab Potential  Fair    Clinical Impairments Affecting Rehab Potential  (+) good compliance, family support (-) history of multiple surgeries, progressive weakness     PT Frequency  2x / week    PT Duration  8 weeks    PT Treatment/Interventions  ADLs/Self Care Home Management;Aquatic Therapy;Cryotherapy;Ultrasound;Traction;Moist Heat;Electrical Stimulation;DME Instruction;Gait training;Stair training;Balance training;Therapeutic exercise;Therapeutic activities;Functional mobility training;Neuromuscular re-education;Patient/family education;Orthotic Fit/Training;Manual techniques;Passive range of motion;Energy conservation;Taping    PT Next Visit Plan  review HEP, progress balance and LE strengthening    PT Home Exercise Plan  see sheet    Consulted and Agree with Plan of Care  Patient       Patient will benefit from skilled therapeutic intervention in order to improve the following deficits and impairments:  Abnormal gait, Decreased activity tolerance,  Decreased coordination, Decreased balance, Decreased endurance, Decreased knowledge of precautions, Decreased mobility, Decreased strength, Difficulty walking, Impaired flexibility, Impaired perceived functional ability, Postural dysfunction, Improper body mechanics, Pain  Visit Diagnosis: Muscle weakness (generalized)  Other abnormalities of gait and mobility  History of falling     Problem List Patient Active Problem List   Diagnosis Date Noted  . Gait abnormality 03/27/2017  . Pressure injury of skin 11/08/2016  . Lumbar radiculopathy, chronic 11/07/2016  . 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 Renal carcinoma ([place])   . 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 Calculus of kidney 02/09/2014  . Nephrolithiasis   . High cholesterol    Janna Arch, PT, DPT   01/21/2018, 2:29 PM  [place] MAIN [facility] SERVICES [address] [place], [place], [address] Phone: [phone]   Fax:  [phone]  Name: Bryan Frey MRN: [id number] Date of Birth: August 17, 1946

## 2018-01-25 NOTE — Discharge Instructions (Signed)

## 2018-01-26 ENCOUNTER — Ambulatory Visit: Payer: PPO | Admitting: Physical Therapy

## 2018-01-26 ENCOUNTER — Ambulatory Visit: Payer: PPO | Admitting: Anesthesiology

## 2018-01-26 ENCOUNTER — Ambulatory Visit
Admission: RE | Admit: 2018-01-26 | Discharge: 2018-01-26 | Disposition: A | Discharge status: Home / Self Care | Payer: PPO | Source: Ambulatory Visit | Attending: Ophthalmology | Admitting: Ophthalmology

## 2018-01-26 ENCOUNTER — Encounter
Admission: RE | Disposition: A | Discharge status: Home / Self Care | Payer: Self-pay | Source: Ambulatory Visit | Attending: Ophthalmology

## 2018-01-26 DIAGNOSIS — Z8673 Personal history of transient ischemic attack (TIA), and cerebral infarction without residual deficits: Secondary | ICD-10-CM | POA: Diagnosis not present

## 2018-01-26 DIAGNOSIS — Z79899 Other long term (current) drug therapy: Secondary | ICD-10-CM | POA: Diagnosis not present

## 2018-01-26 DIAGNOSIS — Z85528 Personal history of other malignant neoplasm of kidney: Secondary | ICD-10-CM | POA: Insufficient documentation

## 2018-01-26 DIAGNOSIS — I10 Essential (primary) hypertension: Secondary | ICD-10-CM | POA: Insufficient documentation

## 2018-01-26 DIAGNOSIS — E7800 Pure hypercholesterolemia, unspecified: Secondary | ICD-10-CM | POA: Diagnosis not present

## 2018-01-26 DIAGNOSIS — H25811 Combined forms of age-related cataract, right eye: Secondary | ICD-10-CM | POA: Diagnosis not present

## 2018-01-26 DIAGNOSIS — H2511 Age-related nuclear cataract, right eye: Secondary | ICD-10-CM | POA: Insufficient documentation

## 2018-01-26 HISTORY — PX: CATARACT EXTRACTION W/PHACO: SHX586

## 2018-01-26 SURGERY — PHACOEMULSIFICATION, CATARACT, WITH IOL INSERTION
Anesthesia: Monitor Anesthesia Care | Site: Eye | Laterality: Right | Wound class: Clean

## 2018-01-26 MED ORDER — LACTATED RINGERS IV SOLN: 10.0000 mL/h | INTRAVENOUS | Status: DC

## 2018-01-26 MED ORDER — SODIUM HYALURONATE 23 MG/ML IO SOLN
INTRAOCULAR | Status: DC | PRN
  Administered 2018-01-26: 0.6 mL via INTRAOCULAR

## 2018-01-26 MED ORDER — ACETAMINOPHEN 160 MG/5ML PO SOLN: 325.0000 mg | ORAL | Status: DC | PRN

## 2018-01-26 MED ORDER — LIDOCAINE HCL (PF) 2 % IJ SOLN
INTRAOCULAR | Status: DC | PRN
  Administered 2018-01-26: 1 mL via INTRAOCULAR

## 2018-01-26 MED ORDER — MIDAZOLAM HCL 2 MG/2ML IJ SOLN
INTRAMUSCULAR | Status: DC | PRN
  Administered 2018-01-26: 2 mg via INTRAVENOUS

## 2018-01-26 MED ORDER — ONDANSETRON HCL 4 MG/2ML IJ SOLN: 4.0000 mg | Freq: Once | INTRAMUSCULAR | Status: DC | PRN

## 2018-01-26 MED ORDER — SODIUM HYALURONATE 10 MG/ML IO SOLN
INTRAOCULAR | Status: DC | PRN
  Administered 2018-01-26: 0.55 mL via INTRAOCULAR

## 2018-01-26 MED ORDER — FENTANYL CITRATE (PF) 100 MCG/2ML IJ SOLN
INTRAMUSCULAR | Status: DC | PRN
  Administered 2018-01-26: 50 ug via INTRAVENOUS

## 2018-01-26 MED ORDER — EPINEPHRINE PF 1 MG/ML IJ SOLN
INTRAOCULAR | Status: DC | PRN
  Administered 2018-01-26: 71 mL via OPHTHALMIC

## 2018-01-26 MED ORDER — MOXIFLOXACIN HCL 0.5 % OP SOLN
OPHTHALMIC | Status: DC | PRN
  Administered 2018-01-26: 0.2 mL via OPHTHALMIC

## 2018-01-26 MED ORDER — ARMC OPHTHALMIC DILATING DROPS
1.0000 "application " | OPHTHALMIC | Status: DC | PRN
  Administered 2018-01-26 (×3): 1 via OPHTHALMIC

## 2018-01-26 MED ORDER — ACETAMINOPHEN 325 MG PO TABS: 325.0000 mg | ORAL_TABLET | ORAL | Status: DC | PRN

## 2018-01-26 SURGICAL SUPPLY — 16 items

## 2018-01-26 NOTE — Anesthesia Postprocedure Evaluation (Signed)
Anesthesia Post Note  Patient: Bryan Frey  Procedure(s) Performed: CATARACT EXTRACTION PHACO AND INTRAOCULAR LENS PLACEMENT ([place]) RIGHT (Right Eye)  Patient location during evaluation: PACU Anesthesia Type: MAC Level of consciousness: awake and alert Pain management: pain level controlled Vital Signs Assessment: post-procedure vital signs reviewed and stable Respiratory status: spontaneous breathing, nonlabored ventilation, respiratory function stable and patient connected to nasal cannula oxygen Cardiovascular status: stable and blood pressure returned to baseline Postop Assessment: no apparent nausea or vomiting Anesthetic complications: no    Veda Canning

## 2018-01-26 NOTE — Transfer of Care (Signed)
Immediate Anesthesia Transfer of Care Note  Patient: Bryan Frey  Procedure(s) Performed: CATARACT EXTRACTION PHACO AND INTRAOCULAR LENS PLACEMENT (I[facility]) RIGHT (Right Eye)  Patient Location: PACU  Anesthesia Type: MAC  Level of Consciousness: awake, alert  and patient cooperative  Airway and Oxygen Therapy: Patient Spontanous Breathing and Patient connected to supplemental oxygen  Post-op Assessment: Post-op Vital signs reviewed, Patient's Cardiovascular Status Stable, Respiratory Function Stable, Patent Airway and No signs of Nausea or vomiting  Post-op Vital Signs: Reviewed and stable  Complications: No apparent anesthesia complications

## 2018-01-26 NOTE — Op Note (Signed)
OPERATIVE NOTE  Bryan Frey [id number] 01/26/2018   PREOPERATIVE DIAGNOSIS:  Nuclear sclerotic cataract right eye.  H25.11   POSTOPERATIVE DIAGNOSIS:    Nuclear sclerotic cataract right eye.     PROCEDURE:  Phacoemusification with posterior chamber intraocular lens placement of the right eye   LENS:   Implant Name Type Inv. Item Serial No. Manufacturer Lot No. LRB No. Used  LENS IOL DIOP 18.0 - [id number] Intraocular Lens LENS IOL DIOP 18.0 [id number]  Right 1       PCB00 +18.0   ULTRASOUND TIME: 0 minutes 36.3 seconds.  CDE 5.37   SURGEON:  Benay Pillow, MD, MPH  ANESTHESIOLOGIST: Anesthesiologist: Veda Canning, MD CRNA: Georga Bora, CRNA   ANESTHESIA:  Topical with tetracaine drops augmented with 1% preservative-free intracameral lidocaine.  ESTIMATED BLOOD LOSS: less than 1 mL.   COMPLICATIONS:  None.   DESCRIPTION OF PROCEDURE:  The patient was identified in the holding room and transported to the operating room and placed in the supine position under the operating microscope.  The right eye was identified as the operative eye and it was prepped and draped in the usual sterile ophthalmic fashion.   A 1.0 millimeter clear-corneal paracentesis was made at the 10:30 position. 0.5 ml of preservative-free 1% lidocaine with epinephrine was injected into the anterior chamber.  The anterior chamber was filled with Healon 5 viscoelastic.  A 2.4 millimeter keratome was used to make a near-clear corneal incision at the 8:00 position.  A curvilinear capsulorrhexis was made with a cystotome and capsulorrhexis forceps.  Balanced salt solution was used to hydrodissect and hydrodelineate the nucleus.   Phacoemulsification was then used in stop and chop fashion to remove the lens nucleus and epinucleus.  The remaining cortex was then removed using the irrigation and aspiration handpiece. Healon was then placed into the capsular bag to distend it for lens placement.  A lens was  then injected into the capsular bag.  The remaining viscoelastic was aspirated.   Wounds were hydrated with balanced salt solution.  The anterior chamber was inflated to a physiologic pressure with balanced salt solution.   Intracameral vigamox 0.1 mL undiluted was injected into the eye and a drop placed onto the ocular surface.  No wound leaks were noted.  The patient was taken to the recovery room in stable condition without complications of anesthesia or surgery  Benay Pillow 01/26/2018, 9:30 AM

## 2018-01-26 NOTE — H&P (Signed)
The History and Physical notes are on paper, have been signed, and are to be scanned.   I have examined the patient and there are no changes to the H&P.   Benay Pillow 01/26/2018 8:58 AM

## 2018-01-26 NOTE — Anesthesia Preprocedure Evaluation (Addendum)
Anesthesia Evaluation  Patient identified by MRN, date of birth, ID band Patient awake    Reviewed: Allergy & Precautions, NPO status , Patient's Chart, lab work & pertinent test results  Airway Mallampati: II  TM Distance: >3 FB     Dental   Pulmonary neg recent URI,    breath sounds clear to auscultation       Cardiovascular hypertension, (-) angina Rhythm:Regular Rate:Normal     Neuro/Psych PSYCHIATRIC DISORDERS Anxiety Depression CVA (TIA)    GI/Hepatic   Endo/Other  Morbid obesity (BMI 34)  Renal/GU      Musculoskeletal  (+) Arthritis ,   Abdominal   Peds  Hematology   Anesthesia Other Findings   Reproductive/Obstetrics                            Anesthesia Physical Anesthesia Plan  ASA: III  Anesthesia Plan: MAC   Post-op Pain Management:    Induction: Intravenous  PONV Risk Score and Plan:   Airway Management Planned: Nasal Cannula  Additional Equipment:   Intra-op Plan:   Post-operative Plan:   Informed Consent: I have reviewed the patients History and Physical, chart, labs and discussed the procedure including the risks, benefits and alternatives for the proposed anesthesia with the patient or authorized representative who has indicated his/her understanding and acceptance.     Plan Discussed with: CRNA  Anesthesia Plan Comments:         Anesthesia Quick Evaluation

## 2018-01-27 ENCOUNTER — Encounter: Payer: Self-pay | Admitting: Ophthalmology

## 2018-01-28 ENCOUNTER — Ambulatory Visit: Payer: PPO | Admitting: Physical Therapy

## 2018-01-28 ENCOUNTER — Encounter: Payer: Self-pay | Admitting: Physical Therapy

## 2018-01-28 DIAGNOSIS — Z9181 History of falling: Secondary | ICD-10-CM

## 2018-01-28 DIAGNOSIS — R2689 Other abnormalities of gait and mobility: Secondary | ICD-10-CM

## 2018-01-28 DIAGNOSIS — M6281 Muscle weakness (generalized): Secondary | ICD-10-CM | POA: Diagnosis not present

## 2018-01-28 NOTE — Therapy (Signed)
[place] MAIN [facility] SERVICES [address] [place], [place], [address] Phone: [phone]   Fax:  [phone]  Physical Therapy Treatment/ Physical Therapy Progress Note   Dates of reporting period  12/09/17   to   01/28/18  Patient Details  Name: Bryan Frey MRN: [id number] Date of Birth: 1946-06-08 Referring Provider: Earleen Newport, MD   Encounter Date: 01/28/2018  PT End of Session - 01/28/18 0934    Visit Number  11    Number of Visits  16    Date for PT Re-Evaluation  02/03/18    PT Start Time  0915    PT Stop Time  1000    PT Time Calculation (min)  45 min    Equipment Utilized During Treatment  Gait belt    Activity Tolerance  Patient tolerated treatment well;Patient limited by fatigue;Treatment limited secondary to medical complications (Comment)    Behavior During Therapy  [facility] for tasks assessed/performed       Past Medical History:  Diagnosis Date  . Anxiety    with diagnosis  . 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 Depression   . Gait abnormality 03/27/2017  . Gall stones   . High cholesterol   . History of kidney stones   . HOH (hard of hearing)   . Hypertension    EKG,  chest  4/13 EPIC  . 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  . Nephrolithiasis   . Neuromuscular disorder (HCC)    facial nerve pain  . Stroke [facility])    TIA    Past Surgical History:  Procedure Laterality Date  . BACK SURGERY    . CATARACT EXTRACTION W/PHACO Left 12/24/2017   Procedure: CATARACT EXTRACTION PHACO AND INTRAOCULAR LENS PLACEMENT (IOC);  Surgeon: Eulogio Bear, MD;  Location: [facility] ORS;  Service: Ophthalmology;  Laterality: Left;  Lot # [id number] H [place]: 00:30.4 AP%: 9.4 CDE: 2.84  . CATARACT EXTRACTION W/PHACO Right 01/26/2018   Procedure: CATARACT EXTRACTION PHACO AND INTRAOCULAR LENS PLACEMENT ([place]) RIGHT;  Surgeon: Eulogio Bear, MD;  Location: [place];  Service: Ophthalmology;  Laterality: Right;  .  COLONOSCOPY    . IR RADIOLOGIST EVAL & MGMT  03/18/2017  . JOINT REPLACEMENT    . Left total hip arthroplasty  08/07/2010  . LUMBAR LAMINECTOMY/DECOMPRESSION MICRODISCECTOMY  02/09/2012   Procedure: LUMBAR LAMINECTOMY/DECOMPRESSION MICRODISCECTOMY 2 LEVELS;  Surgeon: Kristeen Miss, MD;  Location: [place] NEURO ORS;  Service: Neurosurgery;  Laterality: Bilateral;  Bilateral Lumbar two-three,lumbar four-five laminectomies  . NEPHROLITHOTOMY Right 02/09/2014   Procedure: NEPHROLITHOTOMY PERCUTANEOUS;  Surgeon: Franchot Gallo, MD;  Location: [facility];  Service: Urology;  Laterality: Right;  . Right total hip arthroplasty  07/13/2014  . TOTAL HIP REVISION Left 05/14/2015   Procedure: TOTAL HIP REVISION;  Surgeon: Dereck Leep, MD;  Location: [facility] ORS;  Service: Orthopedics;  Laterality: Left;    There were no vitals filed for this visit.  Subjective Assessment - 01/28/18 0932    Subjective  Patient reprots that he just had eye surgery 2 days ago.     Currently in Pain?  Yes    Pain Score  1     Pain Location  Back    Pain Orientation  Lower    Pain Descriptors / Indicators  Aching    Pain Type  Chronic pain    Pain Radiating Towards  legs    Pain Frequency  Intermittent  Aggravating Factors   standing , walking    Multiple Pain Sites  No      Treatment:  Out come measure were performed ; modified oswestry, LEFS, 6 MW test.  Goals were reviewed with progress in all goals Monster walk with GTB x 20 feet x 4 in parallel  Side stepping with GTB x 20 feet x 4 , iin parallel bars Hip ext x 20 BLE Hip abd x 20 BLE Marching high steps x 20  CGA and Min to mod verbal cues used throughout with increased in postural sway and LOB most seen with narrow base of support and while on uneven surfaces. Continues to have balance deficits typical with diagnosis. Patient performs intermediate level exercises without pain behaviors and needs verbal cuing for postural alignment and head  positioning                         PT Education - 01/28/18 0934    Education provided  Yes    Education Details  exercise techique    Person(s) Educated  Patient    Methods  Explanation;Demonstration    Comprehension  Verbalized understanding;Returned demonstration       PT Short Term Goals - 01/28/18 1006      PT SHORT TERM GOAL #1   Title  Patient will increase BLE gross strength to 3+/5 as to improve functional strength for independent gait, increased standing tolerance and increased ADL ability.    Baseline  Gross 3/5    Time  2    Period  Weeks    Status  Partially Met    Target Date  01/28/18      PT SHORT TERM GOAL #2   Title  Patient will improve TUG to <14 seconds to improve mobility and decrease fall risk.     Baseline  3/6 19 seconds,,01/28/18 = 13.68 sec    Time  2    Period  Weeks    Status  Achieved    Target Date  01/28/18      PT SHORT TERM GOAL #3   Title  Patient will be independent in home exercise program to improve strength/mobility for better functional independence with ADLs.    Baseline  HEP given    Time  2    Period  Weeks    Status  Achieved    Target Date  01/28/18        PT Long Term Goals - 01/28/18 0936      PT LONG TERM GOAL #1   Title  Patient will increase BLE gross strength to 4+/5 as to improve functional strength for independent gait, increased standing tolerance and increased ADL ability.    Baseline  gross 3/5    Time  8    Period  Weeks    Status  On-going    Target Date  02/03/18      PT LONG TERM GOAL #2   Title  Patient will increase six minute walk test distance to >1000 for progression to community ambulator and improve gait ability    Baseline  810 ft, 01/28/18=    Time  8    Period  Weeks    Status  On-going    Target Date  02/03/18      PT LONG TERM GOAL #3   Title  Patient will increase lower extremity functional scale to >60/80 to demonstrate improved functional mobility and increased  tolerance with ADLs.  Baseline  25/80, 01/28/18 =  33/80    Time  8    Period  Weeks    Status  New    Target Date  02/03/18      PT LONG TERM GOAL #4   Title  Patient will reduce modified Oswestry score to <20 as to demonstrate minimal disability with ADLs including improved sleeping tolerance, walking/sitting tolerance etc for better mobility with ADLs.     Baseline  3/6/: 44%, 01/28/18 =  34%    Time  8    Period  Weeks    Status  On-going    Target Date  02/03/18            Plan - 01/28/18 0955    Clinical Impression Statement  Patient's condition has the potential to improve in response to therapy. Maximum improvement is yet to be obtained. The anticipated improvement is attainable and reasonable in a generally predictable time. Start date of reporting period 12/09/17 end date of reporting period 01/28/18. Patient reports  that he is getting stronger and is able to walk a bit longer distances than previously.Goals were reviewed with progress in all goals and 2 STG were met.  Patient demonstrates  LE and core weakness, and impaired mobility and lack of  LE strength   that is causing difficulty with transfers and gait activities.  Patient still fatigues quickly to due very weak glut and quad muscles. Patient showed poor form and technique and is able to generate good form after heavy cueing Patient is able to perform exercises that were not possible several weeks ago . Patient will continue to benefit from skilled physical therapy to improve BLE strength and mobiltiy to improve quality of life.     Rehab Potential  Fair    Clinical Impairments Affecting Rehab Potential  (+) good compliance, family support (-) history of multiple surgeries, progressive weakness     PT Frequency  2x / week    PT Duration  8 weeks    PT Treatment/Interventions  ADLs/Self Care Home Management;Aquatic Therapy;Cryotherapy;Ultrasound;Traction;Moist Heat;Electrical Stimulation;DME Instruction;Gait training;Stair  training;Balance training;Therapeutic exercise;Therapeutic activities;Functional mobility training;Neuromuscular re-education;Patient/family education;Orthotic Fit/Training;Manual techniques;Passive range of motion;Energy conservation;Taping    PT Next Visit Plan  review HEP, progress balance and LE strengthening    PT Home Exercise Plan  see sheet    Consulted and Agree with Plan of Care  Patient       Patient will benefit from skilled therapeutic intervention in order to improve the following deficits and impairments:  Abnormal gait, Decreased activity tolerance, Decreased coordination, Decreased balance, Decreased endurance, Decreased knowledge of precautions, Decreased mobility, Decreased strength, Difficulty walking, Impaired flexibility, Impaired perceived functional ability, Postural dysfunction, Improper body mechanics, Pain  Visit Diagnosis: Muscle weakness (generalized)  Other abnormalities of gait and mobility  History of falling     Problem List Patient Active Problem List   Diagnosis Date Noted  . Gait abnormality 03/27/2017  . Pressure injury of skin 11/08/2016  . Lumbar radiculopathy, chronic 11/07/2016  . 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 Renal carcinoma ([place])   . 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 Calculus of kidney 02/09/2014  . Nephrolithiasis   . High cholesterol     Alanson Puls, [place] DPT 01/28/2018, 10:07 AM  [place] MAIN [facility] SERVICES [place], [place], [address] Phone: [phone]   Fax:  [phone]  Name: COLONEL KRAUSER MRN: [id number] Date of Birth: Sep 25, 1946

## 2018-02-03 ENCOUNTER — Ambulatory Visit: Payer: PPO

## 2018-02-04 DIAGNOSIS — Z79899 Other long term (current) drug therapy: Secondary | ICD-10-CM | POA: Diagnosis not present

## 2018-02-04 DIAGNOSIS — E538 Deficiency of other specified B group vitamins: Secondary | ICD-10-CM | POA: Diagnosis not present

## 2018-02-04 DIAGNOSIS — G500 Trigeminal neuralgia: Secondary | ICD-10-CM | POA: Diagnosis not present

## 2018-02-05 ENCOUNTER — Ambulatory Visit: Payer: PPO | Attending: Neurological Surgery

## 2018-02-05 DIAGNOSIS — M6281 Muscle weakness (generalized): Secondary | ICD-10-CM | POA: Diagnosis not present

## 2018-02-05 DIAGNOSIS — Z9181 History of falling: Secondary | ICD-10-CM | POA: Diagnosis not present

## 2018-02-05 DIAGNOSIS — R2689 Other abnormalities of gait and mobility: Secondary | ICD-10-CM | POA: Diagnosis not present

## 2018-02-05 NOTE — Therapy (Signed)
[place] MAIN [facility] SERVICES [place], [place], [address] Phone: [phone]   Fax:  [phone]  Physical Therapy Treatment  Patient Details  Name: Bryan Frey MRN: [id number] Date of Birth: September 12, 1946 Referring Provider: Earleen Newport, MD   Encounter Date: 02/05/2018  PT End of Session - 02/05/18 1020    Visit Number  13    Number of Visits  29    Date for PT Re-Evaluation  04/02/18    Authorization Type  1/10    PT Start Time  1010    PT Stop Time  1046    PT Time Calculation (min)  36 min    Equipment Utilized During Treatment  Gait belt    Activity Tolerance  Patient tolerated treatment well;Patient limited by fatigue;Treatment limited secondary to medical complications (Comment)    Behavior During Therapy  [facility] for tasks assessed/performed       Past Medical History:  Diagnosis Date  . Anxiety    with diagnosis  . 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 Depression   . Gait abnormality 03/27/2017  . Gall stones   . High cholesterol   . History of kidney stones   . HOH (hard of hearing)   . Hypertension    EKG,  chest  4/13 EPIC  . 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  . Nephrolithiasis   . Neuromuscular disorder (HCC)    facial nerve pain  . Stroke [facility])    TIA    Past Surgical History:  Procedure Laterality Date  . BACK SURGERY    . CATARACT EXTRACTION W/PHACO Left 12/24/2017   Procedure: CATARACT EXTRACTION PHACO AND INTRAOCULAR LENS PLACEMENT (IOC);  Surgeon: Eulogio Bear, MD;  Location: [facility] ORS;  Service: Ophthalmology;  Laterality: Left;  Lot # [id number] H [place]: 00:30.4 AP%: 9.4 CDE: 2.84  . CATARACT EXTRACTION W/PHACO Right 01/26/2018   Procedure: CATARACT EXTRACTION PHACO AND INTRAOCULAR LENS PLACEMENT ([place]) RIGHT;  Surgeon: Eulogio Bear, MD;  Location: [place];  Service: Ophthalmology;  Laterality: Right;  . COLONOSCOPY    . IR RADIOLOGIST EVAL & MGMT  03/18/2017  . JOINT  REPLACEMENT    . Left total hip arthroplasty  08/07/2010  . LUMBAR LAMINECTOMY/DECOMPRESSION MICRODISCECTOMY  02/09/2012   Procedure: LUMBAR LAMINECTOMY/DECOMPRESSION MICRODISCECTOMY 2 LEVELS;  Surgeon: Kristeen Miss, MD;  Location: [place] NEURO ORS;  Service: Neurosurgery;  Laterality: Bilateral;  Bilateral Lumbar two-three,lumbar four-five laminectomies  . NEPHROLITHOTOMY Right 02/09/2014   Procedure: NEPHROLITHOTOMY PERCUTANEOUS;  Surgeon: Franchot Gallo, MD;  Location: [facility]S;  Service: Urology;  Laterality: Right;  . Right total hip arthroplasty  07/13/2014  . TOTAL HIP REVISION Left 05/14/2015   Procedure: TOTAL HIP REVISION;  Surgeon: Dereck Leep, MD;  Location: [facility] ORS;  Service: Orthopedics;  Laterality: Left;    There were no vitals filed for this visit.  Subjective Assessment - 02/05/18 1014    Subjective  Patient's medication was altered and has had multiple trigeminal neuro attacks. Reporting last one occured this morning for ~15 minutes.     Pertinent History  Bryan Frey is a 72 year old right-handed white male with a history of chronic low back pain and leg discomfort associated with weightbearing. He has had prior lumbosacral spine surgery, he has cervical spondylosis without spinal cord compression. 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 He has had an  extensive workup that has included MRI evaluation of the entire neuro axis including the brain, cervical spine, thoracic spine, and lumbar spine. The patient walks with a Canadian crutch, he has stumbled or fallen on occasion. He does have some urgency of the bladder, he may have incontinence if he cannot get to the bathroom in time. EMG and nerve conduction study have been done previously showing evidence of chronic L5 and S1 radiculopathy on the right leg. 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  He has no evidence of a myelopathy by MRI of the cervical or  thoracic spine. He has had extensive surgery of the lumbar spine with chronic back pain and mild radiculopathy findings by EMG.  Patient had L2-L5 lumbar fusion. Found that his left leg is 3/4 inch shorter than his right leg and creating a gait abnormality, bothering his back. Wears a lift in L shoe. Sometimes gets stabbing pain in L thigh and can't pick up foot. Will be going on a cruise in June to [place] for 50th anniversary     Limitations  Sitting;Lifting;Standing;Walking;Writing    How long can you sit comfortably?  30 minutes    How long can you stand comfortably?  with canadian crutch 20 minutes    How long can you walk comfortably?  with canadian crutch 5 minutes    Patient Stated Goals  to improve walking, leg strength, be able to go to cruise in June to [place]     Currently in Pain?  No/denies         Ambulate in // bars with cues for upright posture 8x  Side stepping in // bars with RTB around toes for external rotation activation. x4 lengths of // bars. Challenging to patient to maintain ER   Airex pad: 30 seconds   Step over and back half foam roller 15 x each leg  Side step over half foam roller 15x each leg  Ambulate in hallway with BTB Hip F reading cards with horizontal head turns 400 ft   6" toe taps SUE support 20x. ;BUE support  6" step step up and down 10x each leg BUE support   Ambulating in // bars BTB hip F and knee F assist x2 lengths of bars       adjustment of Canadian cane one hole longer to allow for improved upright posture.                 PT Education - 02/05/18 1020    Education provided  Yes    Education Details  exercise technique     Person(s) Educated  Patient    Methods  Explanation;Demonstration;Verbal cues    Comprehension  Verbalized understanding;Returned demonstration       PT Short Term Goals - 01/28/18 1006      PT SHORT TERM GOAL #1   Title  Patient will increase BLE gross strength to 3+/5 as to improve  functional strength for independent gait, increased standing tolerance and increased ADL ability.    Baseline  Gross 3/5    Time  2    Period  Weeks    Status  Partially Met    Target Date  01/28/18      PT SHORT TERM GOAL #2   Title  Patient will improve TUG to <14 seconds to improve mobility and decrease fall risk.     Baseline  3/6 19 seconds,,01/28/18 = 13.68 sec    Time  2    Period  Weeks    Status  Achieved    Target Date  01/28/18      PT SHORT TERM GOAL #3   Title  Patient will be independent in home exercise program to improve strength/mobility for better functional independence with ADLs.    Baseline  HEP given    Time  2    Period  Weeks    Status  Achieved    Target Date  01/28/18        PT Long Term Goals - 02/05/18 1145      PT LONG TERM GOAL #1   Title  Patient will increase BLE gross strength to 4+/5 as to improve functional strength for independent gait, increased standing tolerance and increased ADL ability.    Baseline  gross 3/5    Time  8    Period  Weeks    Status  On-going    Target Date  04/02/18      PT LONG TERM GOAL #2   Title  Patient will increase six minute walk test distance to >1000 for progression to community ambulator and improve gait ability    Baseline  810 ft, 01/28/18=    Time  8    Period  Weeks    Status  On-going    Target Date  04/02/18      PT LONG TERM GOAL #3   Title  Patient will increase lower extremity functional scale to >60/80 to demonstrate improved functional mobility and increased tolerance with ADLs.     Baseline  25/80, 01/28/18 =  33/80    Time  8    Period  Weeks    Status  New    Target Date  04/02/18      PT LONG TERM GOAL #4   Title  Patient will reduce modified Oswestry score to <20 as to demonstrate minimal disability with ADLs including improved sleeping tolerance, walking/sitting tolerance etc for better mobility with ADLs.     Baseline  3/6/: 44%, 01/28/18 =  34%    Time  8    Period  Weeks     Status  On-going    Target Date  04/02/18            Plan - 02/05/18 1145    Clinical Impression Statement  Patient's goals assessed last session with patient demonstrating progression towards all goals with two short term goals being met. Patient's current status level continues to have potential for improvement in response to skilled physical therapy. Improved strength and mobility noted with patient continuing to require focus on strengthening of LE and core to allow for smoother gait and mobility to reduce fall risk and improved capacity for functional activities. Patient will continue to benefit from skilled physical therapy to improve balance, strength, and reduce fall risks for improved mobility and independence in home and public environments.     Rehab Potential  Fair    Clinical Impairments Affecting Rehab Potential  (+) good compliance, family support (-) history of multiple surgeries, progressive weakness     PT Frequency  2x / week    PT Duration  8 weeks    PT Treatment/Interventions  ADLs/Self Care Home Management;Aquatic Therapy;Cryotherapy;Ultrasound;Traction;Moist Heat;Electrical Stimulation;DME Instruction;Gait training;Stair training;Balance training;Therapeutic exercise;Therapeutic activities;Functional mobility training;Neuromuscular re-education;Patient/family education;Orthotic Fit/Training;Manual techniques;Passive range of motion;Energy conservation;Taping    PT Next Visit Plan  review HEP, progress balance and LE strengthening    PT Home Exercise Plan  see sheet    Consulted and Agree with  Plan of Care  Patient       Patient will benefit from skilled therapeutic intervention in order to improve the following deficits and impairments:  Abnormal gait, Decreased activity tolerance, Decreased coordination, Decreased balance, Decreased endurance, Decreased knowledge of precautions, Decreased mobility, Decreased strength, Difficulty walking, Impaired flexibility, Impaired  perceived functional ability, Postural dysfunction, Improper body mechanics, Pain  Visit Diagnosis: Muscle weakness (generalized)  Other abnormalities of gait and mobility  History of falling     Problem List Patient Active Problem List   Diagnosis Date Noted  . Gait abnormality 03/27/2017  . Pressure injury of skin 11/08/2016  . Lumbar radiculopathy, chronic 11/07/2016  . 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 Renal carcinoma ([place])   . 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 Calculus of kidney 02/09/2014  . Nephrolithiasis   . High cholesterol   Janna Arch, PT, DPT    02/05/2018, 11:46 AM  [place] MAIN [facility] SERVICES [address] [place], [place], [address] Phone: [phone]   Fax:  [phone]  Name: Bryan Frey MRN: [id number] Date of Birth: 1946-01-04

## 2018-02-09 ENCOUNTER — Ambulatory Visit: Payer: PPO | Admitting: Physical Therapy

## 2018-02-09 ENCOUNTER — Encounter: Payer: Self-pay | Admitting: Physical Therapy

## 2018-02-09 DIAGNOSIS — R2689 Other abnormalities of gait and mobility: Secondary | ICD-10-CM

## 2018-02-09 DIAGNOSIS — M6281 Muscle weakness (generalized): Secondary | ICD-10-CM

## 2018-02-09 DIAGNOSIS — Z9181 History of falling: Secondary | ICD-10-CM

## 2018-02-09 NOTE — Therapy (Signed)
[place] MAIN [facility] SERVICES [address] [place], [place], [address] Phone: [phone]   Fax:  [phone]  Physical Therapy Treatment/ Discharge summary  Patient Details  Name: Bryan Frey MRN: [id number] Date of Birth: Dec 16, 1945 Referring Provider: Earleen Newport, MD   Encounter Date: 02/09/2018  PT End of Session - 02/09/18 1131    Visit Number  14    Number of Visits  29    Date for PT Re-Evaluation  04/02/18    Authorization Type  2/10    PT Start Time  1130    PT Stop Time  1210    PT Time Calculation (min)  40 min    Equipment Utilized During Treatment  Gait belt    Activity Tolerance  Patient tolerated treatment well;Patient limited by fatigue;Treatment limited secondary to medical complications (Comment)    Behavior During Therapy  [facility] for tasks assessed/performed       Past Medical History:  Diagnosis Date  . Anxiety    with diagnosis  . 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 Depression   . Gait abnormality 03/27/2017  . Gall stones   . High cholesterol   . History of kidney stones   . HOH (hard of hearing)   . Hypertension    EKG,  chest  4/13 EPIC  . 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  . Nephrolithiasis   . Neuromuscular disorder (HCC)    facial nerve pain  . Stroke [facility])    TIA    Past Surgical History:  Procedure Laterality Date  . BACK SURGERY    . CATARACT EXTRACTION W/PHACO Left 12/24/2017   Procedure: CATARACT EXTRACTION PHACO AND INTRAOCULAR LENS PLACEMENT (IOC);  Surgeon: Eulogio Bear, MD;  Location: [facility] ORS;  Service: Ophthalmology;  Laterality: Left;  Lot # [id number] H [place]: 00:30.4 AP%: 9.4 CDE: 2.84  . CATARACT EXTRACTION W/PHACO Right 01/26/2018   Procedure: CATARACT EXTRACTION PHACO AND INTRAOCULAR LENS PLACEMENT ([place]) RIGHT;  Surgeon: Eulogio Bear, MD;  Location: [facility];  Service: Ophthalmology;  Laterality: Right;  . COLONOSCOPY    . IR RADIOLOGIST EVAL & MGMT   03/18/2017  . JOINT REPLACEMENT    . Left total hip arthroplasty  08/07/2010  . LUMBAR LAMINECTOMY/DECOMPRESSION MICRODISCECTOMY  02/09/2012   Procedure: LUMBAR LAMINECTOMY/DECOMPRESSION MICRODISCECTOMY 2 LEVELS;  Surgeon: Kristeen Miss, MD;  Location: [place] NEURO ORS;  Service: Neurosurgery;  Laterality: Bilateral;  Bilateral Lumbar two-three,lumbar four-five laminectomies  . NEPHROLITHOTOMY Right 02/09/2014   Procedure: NEPHROLITHOTOMY PERCUTANEOUS;  Surgeon: Franchot Gallo, MD;  Location: [facility] ORS;  Service: Urology;  Laterality: Right;  . Right total hip arthroplasty  07/13/2014  . TOTAL HIP REVISION Left 05/14/2015   Procedure: TOTAL HIP REVISION;  Surgeon: Dereck Leep, MD;  Location: [facility] ORS;  Service: Orthopedics;  Laterality: Left;    There were no vitals filed for this visit.  Subjective Assessment - 02/09/18 1128    Subjective  Patient's medication was altered and has had multiple trigeminal neuro attacks. Reporting last one occured this morning for ~15 minutes.     Pertinent History  Bryan Frey is a 72 year old right-handed white male with a history of chronic low back pain and leg discomfort associated with weightbearing. He has had prior lumbosacral spine surgery, he has cervical spondylosis without spinal cord compression. 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 He has  had an extensive workup that has included MRI evaluation of the entire neuro axis including the brain, cervical spine, thoracic spine, and lumbar spine. The patient walks with a Canadian crutch, he has stumbled or fallen on occasion. He does have some urgency of the bladder, he may have incontinence if he cannot get to the bathroom in time. EMG and nerve conduction study have been done previously showing evidence of chronic L5 and S1 radiculopathy on the right leg. 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  He has no evidence of a myelopathy by  MRI of the cervical or thoracic spine. He has had extensive surgery of the lumbar spine with chronic back pain and mild radiculopathy findings by EMG.  Patient had L2-L5 lumbar fusion. Found that his left leg is 3/4 inch shorter than his right leg and creating a gait abnormality, bothering his back. Wears a lift in L shoe. Sometimes gets stabbing pain in L thigh and can't pick up foot. Will be going on a cruise in June to [place] for 50th anniversary     Limitations  Sitting;Lifting;Standing;Walking;Writing    How long can you sit comfortably?  30 minutes    How long can you stand comfortably?  with canadian crutch 20 minutes    How long can you walk comfortably?  with canadian crutch 5 minutes    Patient Stated Goals  to improve walking, leg strength, be able to go to cruise in June to [place]     Currently in Pain?  Yes    Pain Score  3     Pain Location  Face    Pain Descriptors / Indicators  Stabbing    Pain Onset  More than a month ago    Pain Frequency  Intermittent    Aggravating Factors   blowing on his face, touching it , turning his head       Treatment:   Ambulate in // bars with cues for upright posture 8x  Side stepping in // bars with RTB around toes for external rotation activation. x4 lengths of // bars. Challenging to patient to maintain ER  Airex pad: 30 seconds   Step over and back half foam roller 15 x each leg  Side step over half foam roller 15x each leg  6" toe taps SUE support 20x. ;BUE support  6" step step up and down 10x each leg BUE support  CGA and Min to mod verbal cues used throughout with increased in postural sway and LOB most seen with narrow base of support and while on uneven surfaces. Continues to have balance deficits typical with diagnosis.                     PT Education - 02/09/18 1130    Education provided  Yes    Education Details  exercise technique    Person(s) Educated  Patient    Methods  Explanation     Comprehension  Verbalized understanding       PT Short Term Goals - 01/28/18 1006      PT SHORT TERM GOAL #1   Title  Patient will increase BLE gross strength to 3+/5 as to improve functional strength for independent gait, increased standing tolerance and increased ADL ability.    Baseline  Gross 3/5    Time  2    Period  Weeks    Status  Partially Met    Target Date  01/28/18  PT SHORT TERM GOAL #2   Title  Patient will improve TUG to <14 seconds to improve mobility and decrease fall risk.     Baseline  3/6 19 seconds,,01/28/18 = 13.68 sec    Time  2    Period  Weeks    Status  Achieved    Target Date  01/28/18      PT SHORT TERM GOAL #3   Title  Patient will be independent in home exercise program to improve strength/mobility for better functional independence with ADLs.    Baseline  HEP given    Time  2    Period  Weeks    Status  Achieved    Target Date  01/28/18        PT Long Term Goals - 02/05/18 1145      PT LONG TERM GOAL #1   Title  Patient will increase BLE gross strength to 4+/5 as to improve functional strength for independent gait, increased standing tolerance and increased ADL ability.    Baseline  gross 3/5    Time  8    Period  Weeks    Status  On-going    Target Date  04/02/18      PT LONG TERM GOAL #2   Title  Patient will increase six minute walk test distance to >1000 for progression to community ambulator and improve gait ability    Baseline  810 ft, 01/28/18=    Time  8    Period  Weeks    Status  On-going    Target Date  04/02/18      PT LONG TERM GOAL #3   Title  Patient will increase lower extremity functional scale to >60/80 to demonstrate improved functional mobility and increased tolerance with ADLs.     Baseline  25/80, 01/28/18 =  33/80    Time  8    Period  Weeks    Status  New    Target Date  04/02/18      PT LONG TERM GOAL #4   Title  Patient will reduce modified Oswestry score to <20 as to demonstrate minimal disability  with ADLs including improved sleeping tolerance, walking/sitting tolerance etc for better mobility with ADLs.     Baseline  3/6/: 44%, 01/28/18 =  34%    Time  8    Period  Weeks    Status  On-going    Target Date  04/02/18            Plan - 02/09/18 1136    Clinical Impression Statement  Pt presents with unsteadiness on uneven surfaces and fatigues with therapeutic exercises. Patient needs assist with beginning level balance activities and needs CGA assist with standing activities. Patient demonstrates difficulty with dynamic standing balance and with narrow  base of support and increased challenges for UE.  Patient tolerated all interventions well this date but was decided to DC today due to other condition, trigeminal neuro attacks,  that are limiting his ability to participate fully. Goals were reviewed and remain the same as last visit.     Rehab Potential  Fair    Clinical Impairments Affecting Rehab Potential  (+) good compliance, family support (-) history of multiple surgeries, progressive weakness     PT Frequency  2x / week    PT Duration  8 weeks    PT Treatment/Interventions  ADLs/Self Care Home Management;Aquatic Therapy;Cryotherapy;Ultrasound;Traction;Moist Heat;Electrical Stimulation;DME Instruction;Gait training;Stair training;Balance training;Therapeutic exercise;Therapeutic activities;Functional mobility training;Neuromuscular re-education;Patient/family education;Orthotic Fit/Training;Manual techniques;Passive  range of motion;Energy conservation;Taping    PT Next Visit Plan  review HEP, progress balance and LE strengthening    PT Home Exercise Plan  see sheet    Consulted and Agree with Plan of Care  Patient       Patient will benefit from skilled therapeutic intervention in order to improve the following deficits and impairments:  Abnormal gait, Decreased activity tolerance, Decreased coordination, Decreased balance, Decreased endurance, Decreased knowledge of  precautions, Decreased mobility, Decreased strength, Difficulty walking, Impaired flexibility, Impaired perceived functional ability, Postural dysfunction, Improper body mechanics, Pain  Visit Diagnosis: Muscle weakness (generalized)  Other abnormalities of gait and mobility  History of falling     Problem List Patient Active Problem List   Diagnosis Date Noted  . Gait abnormality 03/27/2017  . Pressure injury of skin 11/08/2016  . Lumbar radiculopathy, chronic 11/07/2016  . 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 Renal carcinoma ([place])   . 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 Calculus of kidney 02/09/2014  . Nephrolithiasis   . High cholesterol     Arelia Sneddon S,PT DPT 02/09/2018, 11:39 AM  [place] MAIN [facility] SERVICES [address] [place], [place], [address] Phone: [phone]   Fax:  [phone]  Name: Bryan Frey MRN: [id number] Date of Birth: 1946-04-02

## 2018-02-12 ENCOUNTER — Ambulatory Visit: Payer: PPO

## 2018-02-17 ENCOUNTER — Ambulatory Visit: Payer: PPO | Admitting: Physical Therapy

## 2018-02-19 ENCOUNTER — Ambulatory Visit: Payer: PPO

## 2018-02-23 ENCOUNTER — Ambulatory Visit: Payer: PPO

## 2018-02-26 ENCOUNTER — Ambulatory Visit: Payer: PPO

## 2018-03-02 ENCOUNTER — Ambulatory Visit: Payer: PPO

## 2018-03-04 ENCOUNTER — Ambulatory Visit: Payer: PPO

## 2018-03-08 ENCOUNTER — Ambulatory Visit: Payer: PPO

## 2018-03-10 ENCOUNTER — Ambulatory Visit: Payer: PPO

## 2018-03-15 ENCOUNTER — Ambulatory Visit: Payer: PPO

## 2018-03-17 ENCOUNTER — Ambulatory Visit: Payer: PPO

## 2018-03-22 DIAGNOSIS — J329 Chronic sinusitis, unspecified: Secondary | ICD-10-CM | POA: Diagnosis not present

## 2018-03-22 DIAGNOSIS — S0181XA Laceration without foreign body of other part of head, initial encounter: Secondary | ICD-10-CM | POA: Diagnosis not present

## 2018-03-29 DIAGNOSIS — J45902 Unspecified asthma with status asthmaticus: Secondary | ICD-10-CM | POA: Diagnosis not present

## 2018-03-29 DIAGNOSIS — R05 Cough: Secondary | ICD-10-CM | POA: Diagnosis not present

## 2018-03-29 DIAGNOSIS — J40 Bronchitis, not specified as acute or chronic: Secondary | ICD-10-CM | POA: Diagnosis not present

## 2018-03-29 DIAGNOSIS — Z23 Encounter for immunization: Secondary | ICD-10-CM | POA: Diagnosis not present

## 2018-04-09 DIAGNOSIS — R269 Unspecified abnormalities of gait and mobility: Secondary | ICD-10-CM | POA: Diagnosis not present

## 2018-04-09 DIAGNOSIS — E7849 Other hyperlipidemia: Secondary | ICD-10-CM | POA: Diagnosis not present

## 2018-04-14 DIAGNOSIS — E538 Deficiency of other specified B group vitamins: Secondary | ICD-10-CM | POA: Diagnosis not present

## 2018-04-14 DIAGNOSIS — E7849 Other hyperlipidemia: Secondary | ICD-10-CM | POA: Diagnosis not present

## 2018-04-14 DIAGNOSIS — Z125 Encounter for screening for malignant neoplasm of prostate: Secondary | ICD-10-CM | POA: Diagnosis not present

## 2018-04-14 DIAGNOSIS — Z79899 Other long term (current) drug therapy: Secondary | ICD-10-CM | POA: Diagnosis not present

## 2018-05-04 DIAGNOSIS — D485 Neoplasm of uncertain behavior of skin: Secondary | ICD-10-CM | POA: Diagnosis not present

## 2018-05-04 DIAGNOSIS — L578 Other skin changes due to chronic exposure to nonionizing radiation: Secondary | ICD-10-CM | POA: Diagnosis not present

## 2018-05-04 DIAGNOSIS — Z1283 Encounter for screening for malignant neoplasm of skin: Secondary | ICD-10-CM | POA: Diagnosis not present

## 2018-05-04 DIAGNOSIS — L308 Other specified dermatitis: Secondary | ICD-10-CM | POA: Diagnosis not present

## 2018-05-04 DIAGNOSIS — L570 Actinic keratosis: Secondary | ICD-10-CM | POA: Diagnosis not present

## 2018-06-14 DIAGNOSIS — D485 Neoplasm of uncertain behavior of skin: Secondary | ICD-10-CM | POA: Diagnosis not present

## 2018-06-14 DIAGNOSIS — D2262 Melanocytic nevi of left upper limb, including shoulder: Secondary | ICD-10-CM | POA: Diagnosis not present

## 2018-06-17 DIAGNOSIS — G500 Trigeminal neuralgia: Secondary | ICD-10-CM | POA: Diagnosis not present

## 2018-06-18 DIAGNOSIS — N200 Calculus of kidney: Secondary | ICD-10-CM | POA: Diagnosis not present

## 2018-06-18 DIAGNOSIS — C642 Malignant neoplasm of left kidney, except renal pelvis: Secondary | ICD-10-CM | POA: Diagnosis not present

## 2018-09-16 DIAGNOSIS — G500 Trigeminal neuralgia: Secondary | ICD-10-CM | POA: Diagnosis not present

## 2018-09-16 DIAGNOSIS — R262 Difficulty in walking, not elsewhere classified: Secondary | ICD-10-CM | POA: Diagnosis not present

## 2018-09-16 DIAGNOSIS — R29898 Other symptoms and signs involving the musculoskeletal system: Secondary | ICD-10-CM | POA: Diagnosis not present

## 2018-09-16 DIAGNOSIS — M62838 Other muscle spasm: Secondary | ICD-10-CM | POA: Diagnosis not present

## 2018-10-13 DIAGNOSIS — G500 Trigeminal neuralgia: Secondary | ICD-10-CM | POA: Diagnosis not present

## 2018-10-13 DIAGNOSIS — R269 Unspecified abnormalities of gait and mobility: Secondary | ICD-10-CM | POA: Diagnosis not present

## 2018-10-13 DIAGNOSIS — M6289 Other specified disorders of muscle: Secondary | ICD-10-CM | POA: Diagnosis not present

## 2018-10-13 DIAGNOSIS — M5136 Other intervertebral disc degeneration, lumbar region: Secondary | ICD-10-CM | POA: Diagnosis not present

## 2018-10-18 DIAGNOSIS — Z125 Encounter for screening for malignant neoplasm of prostate: Secondary | ICD-10-CM | POA: Diagnosis not present

## 2018-10-18 DIAGNOSIS — E7849 Other hyperlipidemia: Secondary | ICD-10-CM | POA: Diagnosis not present

## 2018-10-18 DIAGNOSIS — M6289 Other specified disorders of muscle: Secondary | ICD-10-CM | POA: Diagnosis not present

## 2018-10-18 DIAGNOSIS — Z79899 Other long term (current) drug therapy: Secondary | ICD-10-CM | POA: Diagnosis not present

## 2018-10-21 DIAGNOSIS — E538 Deficiency of other specified B group vitamins: Secondary | ICD-10-CM | POA: Diagnosis not present

## 2018-10-21 DIAGNOSIS — Z0000 Encounter for general adult medical examination without abnormal findings: Secondary | ICD-10-CM | POA: Diagnosis not present

## 2018-10-21 DIAGNOSIS — E7849 Other hyperlipidemia: Secondary | ICD-10-CM | POA: Diagnosis not present

## 2018-11-03 DIAGNOSIS — M6289 Other specified disorders of muscle: Secondary | ICD-10-CM | POA: Diagnosis not present

## 2018-11-03 DIAGNOSIS — M5136 Other intervertebral disc degeneration, lumbar region: Secondary | ICD-10-CM | POA: Diagnosis not present

## 2018-12-14 DIAGNOSIS — L821 Other seborrheic keratosis: Secondary | ICD-10-CM | POA: Diagnosis not present

## 2018-12-14 DIAGNOSIS — D485 Neoplasm of uncertain behavior of skin: Secondary | ICD-10-CM | POA: Diagnosis not present

## 2018-12-14 DIAGNOSIS — L570 Actinic keratosis: Secondary | ICD-10-CM | POA: Diagnosis not present

## 2018-12-14 DIAGNOSIS — L218 Other seborrheic dermatitis: Secondary | ICD-10-CM | POA: Diagnosis not present

## 2018-12-14 DIAGNOSIS — L578 Other skin changes due to chronic exposure to nonionizing radiation: Secondary | ICD-10-CM | POA: Diagnosis not present

## 2018-12-14 DIAGNOSIS — Z86018 Personal history of other benign neoplasm: Secondary | ICD-10-CM | POA: Diagnosis not present

## 2018-12-14 DIAGNOSIS — C44529 Squamous cell carcinoma of skin of other part of trunk: Secondary | ICD-10-CM | POA: Diagnosis not present

## 2018-12-17 DIAGNOSIS — M62838 Other muscle spasm: Secondary | ICD-10-CM | POA: Diagnosis not present

## 2018-12-17 DIAGNOSIS — M5136 Other intervertebral disc degeneration, lumbar region: Secondary | ICD-10-CM | POA: Diagnosis not present

## 2018-12-17 DIAGNOSIS — G500 Trigeminal neuralgia: Secondary | ICD-10-CM | POA: Diagnosis not present

## 2018-12-17 DIAGNOSIS — R269 Unspecified abnormalities of gait and mobility: Secondary | ICD-10-CM | POA: Diagnosis not present

## 2019-01-17 DIAGNOSIS — C44529 Squamous cell carcinoma of skin of other part of trunk: Secondary | ICD-10-CM | POA: Diagnosis not present

## 2019-04-15 DIAGNOSIS — E538 Deficiency of other specified B group vitamins: Secondary | ICD-10-CM | POA: Diagnosis not present

## 2019-04-15 DIAGNOSIS — E7849 Other hyperlipidemia: Secondary | ICD-10-CM | POA: Diagnosis not present

## 2019-04-21 DIAGNOSIS — E538 Deficiency of other specified B group vitamins: Secondary | ICD-10-CM | POA: Diagnosis not present

## 2019-04-21 DIAGNOSIS — R269 Unspecified abnormalities of gait and mobility: Secondary | ICD-10-CM | POA: Diagnosis not present

## 2019-04-21 DIAGNOSIS — E7849 Other hyperlipidemia: Secondary | ICD-10-CM | POA: Diagnosis not present

## 2019-04-21 DIAGNOSIS — Z125 Encounter for screening for malignant neoplasm of prostate: Secondary | ICD-10-CM | POA: Diagnosis not present

## 2019-04-28 DIAGNOSIS — R269 Unspecified abnormalities of gait and mobility: Secondary | ICD-10-CM | POA: Diagnosis not present

## 2019-04-28 DIAGNOSIS — Z79899 Other long term (current) drug therapy: Secondary | ICD-10-CM | POA: Diagnosis not present

## 2019-04-28 DIAGNOSIS — G500 Trigeminal neuralgia: Secondary | ICD-10-CM | POA: Diagnosis not present

## 2019-04-28 DIAGNOSIS — M62838 Other muscle spasm: Secondary | ICD-10-CM | POA: Diagnosis not present

## 2019-06-16 DIAGNOSIS — Z859 Personal history of malignant neoplasm, unspecified: Secondary | ICD-10-CM | POA: Diagnosis not present

## 2019-06-16 DIAGNOSIS — Z872 Personal history of diseases of the skin and subcutaneous tissue: Secondary | ICD-10-CM | POA: Diagnosis not present

## 2019-06-16 DIAGNOSIS — D492 Neoplasm of unspecified behavior of bone, soft tissue, and skin: Secondary | ICD-10-CM | POA: Diagnosis not present

## 2019-06-16 DIAGNOSIS — L918 Other hypertrophic disorders of the skin: Secondary | ICD-10-CM | POA: Diagnosis not present

## 2019-06-16 DIAGNOSIS — Z86018 Personal history of other benign neoplasm: Secondary | ICD-10-CM | POA: Diagnosis not present

## 2019-06-16 DIAGNOSIS — L578 Other skin changes due to chronic exposure to nonionizing radiation: Secondary | ICD-10-CM | POA: Diagnosis not present

## 2019-06-16 DIAGNOSIS — L570 Actinic keratosis: Secondary | ICD-10-CM | POA: Diagnosis not present

## 2019-10-20 DIAGNOSIS — E538 Deficiency of other specified B group vitamins: Secondary | ICD-10-CM | POA: Diagnosis not present

## 2019-10-20 DIAGNOSIS — E7849 Other hyperlipidemia: Secondary | ICD-10-CM | POA: Diagnosis not present

## 2019-10-20 DIAGNOSIS — Z125 Encounter for screening for malignant neoplasm of prostate: Secondary | ICD-10-CM | POA: Diagnosis not present

## 2019-10-27 DIAGNOSIS — Z0000 Encounter for general adult medical examination without abnormal findings: Secondary | ICD-10-CM | POA: Diagnosis not present

## 2019-10-27 DIAGNOSIS — E7849 Other hyperlipidemia: Secondary | ICD-10-CM | POA: Diagnosis not present

## 2019-10-27 DIAGNOSIS — E538 Deficiency of other specified B group vitamins: Secondary | ICD-10-CM | POA: Diagnosis not present

## 2019-11-09 DIAGNOSIS — M62838 Other muscle spasm: Secondary | ICD-10-CM | POA: Diagnosis not present

## 2019-11-09 DIAGNOSIS — G500 Trigeminal neuralgia: Secondary | ICD-10-CM | POA: Diagnosis not present

## 2019-12-19 DIAGNOSIS — L8991 Pressure ulcer of unspecified site, stage 1: Secondary | ICD-10-CM | POA: Diagnosis not present

## 2019-12-19 DIAGNOSIS — L918 Other hypertrophic disorders of the skin: Secondary | ICD-10-CM | POA: Diagnosis not present

## 2020-01-16 DIAGNOSIS — M5416 Radiculopathy, lumbar region: Secondary | ICD-10-CM | POA: Diagnosis not present

## 2020-01-16 DIAGNOSIS — M48062 Spinal stenosis, lumbar region with neurogenic claudication: Secondary | ICD-10-CM | POA: Diagnosis not present

## 2020-01-16 DIAGNOSIS — M5136 Other intervertebral disc degeneration, lumbar region: Secondary | ICD-10-CM | POA: Diagnosis not present

## 2020-02-01 DIAGNOSIS — M48062 Spinal stenosis, lumbar region with neurogenic claudication: Secondary | ICD-10-CM | POA: Diagnosis not present

## 2020-02-01 DIAGNOSIS — M5136 Other intervertebral disc degeneration, lumbar region: Secondary | ICD-10-CM | POA: Diagnosis not present

## 2020-02-01 DIAGNOSIS — M5416 Radiculopathy, lumbar region: Secondary | ICD-10-CM | POA: Diagnosis not present

## 2020-02-07 DIAGNOSIS — G500 Trigeminal neuralgia: Secondary | ICD-10-CM | POA: Diagnosis not present

## 2020-02-07 DIAGNOSIS — M5136 Other intervertebral disc degeneration, lumbar region: Secondary | ICD-10-CM | POA: Diagnosis not present

## 2020-02-07 DIAGNOSIS — R269 Unspecified abnormalities of gait and mobility: Secondary | ICD-10-CM | POA: Diagnosis not present

## 2020-02-13 DIAGNOSIS — M25562 Pain in left knee: Secondary | ICD-10-CM | POA: Diagnosis not present

## 2020-02-13 DIAGNOSIS — Z96642 Presence of left artificial hip joint: Secondary | ICD-10-CM | POA: Diagnosis not present

## 2020-02-13 DIAGNOSIS — M25552 Pain in left hip: Secondary | ICD-10-CM | POA: Diagnosis not present

## 2020-02-15 ENCOUNTER — Other Ambulatory Visit: Payer: Self-pay | Admitting: Physician Assistant

## 2020-02-15 DIAGNOSIS — Z96649 Presence of unspecified artificial hip joint: Secondary | ICD-10-CM

## 2020-02-15 DIAGNOSIS — T84038A Mechanical loosening of other internal prosthetic joint, initial encounter: Secondary | ICD-10-CM

## 2020-02-22 DIAGNOSIS — M5416 Radiculopathy, lumbar region: Secondary | ICD-10-CM | POA: Diagnosis not present

## 2020-02-22 DIAGNOSIS — M5136 Other intervertebral disc degeneration, lumbar region: Secondary | ICD-10-CM | POA: Diagnosis not present

## 2020-02-22 DIAGNOSIS — M48062 Spinal stenosis, lumbar region with neurogenic claudication: Secondary | ICD-10-CM | POA: Diagnosis not present

## 2020-02-28 ENCOUNTER — Other Ambulatory Visit: Payer: Self-pay

## 2020-02-28 ENCOUNTER — Ambulatory Visit
Admission: RE | Admit: 2020-02-28 | Discharge: 2020-02-28 | Disposition: A | Discharge status: Home / Self Care | Payer: PPO | Source: Ambulatory Visit | Attending: Physician Assistant | Admitting: Physician Assistant

## 2020-02-28 DIAGNOSIS — Z96649 Presence of unspecified artificial hip joint: Secondary | ICD-10-CM | POA: Insufficient documentation

## 2020-02-28 DIAGNOSIS — M25552 Pain in left hip: Secondary | ICD-10-CM | POA: Diagnosis not present

## 2020-02-28 DIAGNOSIS — Z96642 Presence of left artificial hip joint: Secondary | ICD-10-CM | POA: Diagnosis not present

## 2020-02-28 DIAGNOSIS — T84038A Mechanical loosening of other internal prosthetic joint, initial encounter: Secondary | ICD-10-CM | POA: Diagnosis not present

## 2020-02-28 DIAGNOSIS — Z471 Aftercare following joint replacement surgery: Secondary | ICD-10-CM | POA: Diagnosis not present

## 2020-03-06 DIAGNOSIS — Z96649 Presence of unspecified artificial hip joint: Secondary | ICD-10-CM | POA: Diagnosis not present

## 2020-03-06 DIAGNOSIS — T84038A Mechanical loosening of other internal prosthetic joint, initial encounter: Secondary | ICD-10-CM | POA: Diagnosis not present

## 2020-03-09 DIAGNOSIS — R269 Unspecified abnormalities of gait and mobility: Secondary | ICD-10-CM | POA: Diagnosis not present

## 2020-03-28 ENCOUNTER — Encounter
Admission: RE | Admit: 2020-03-28 | Discharge: 2020-03-28 | Disposition: A | Discharge status: Home / Self Care | Payer: PPO | Source: Ambulatory Visit | Attending: Orthopedic Surgery | Admitting: Orthopedic Surgery

## 2020-03-28 ENCOUNTER — Other Ambulatory Visit: Payer: Self-pay

## 2020-03-28 ENCOUNTER — Encounter: Payer: Self-pay | Admitting: Orthopedic Surgery

## 2020-03-28 DIAGNOSIS — Z01812 Encounter for preprocedural laboratory examination: Secondary | ICD-10-CM | POA: Insufficient documentation

## 2020-03-28 HISTORY — DX: Trigeminal neuralgia: G50.0

## 2020-03-28 NOTE — Patient Instructions (Addendum)
INSTRUCTIONS FOR SURGERY     Your surgery is scheduled for:   Friday, July 2nd     To find out your arrival time for the day of surgery,          please call [phone] between 1 pm and 3 pm on :  Thursday , July 1st     When you arrive for surgery, report to the [place].       Do NOT stop on the first floor to register.    REMEMBER: Instructions that are not followed completely may result in serious medical risk,  up to and including death, or upon the discretion of your surgeon and anesthesiologist,            your surgery may need to be rescheduled.  __X__ 1. Do not eat food after midnight the night before your procedure.                    No gum, candy, lozenger, tic tacs, tums or hard candies.                  ABSOLUTELY NOTHING SOLID IN YOUR MOUTH AFTER MIDNIGHT                    You may drink unlimited clear liquids up to 2 hours before you are scheduled to arrive for surgery.                   Do not drink anything within those 2 hours unless you need to take medicine, then take the                   smallest amount you need.  Clear liquids include:  water, apple juice without pulp,                   any flavor Gatorade, Black coffee, black tea.  Sugar may be added but no dairy/ honey /lemon.                        Broth and jello is not considered a clear liquid.  __x__  2. On the morning of surgery, please brush your teeth with toothpaste and water. You may rinse with                  mouthwash if you wish but DO NOT SWALLOW TOOTHPASTE OR MOUTHWASH  __X___3. NO alcohol for 24 hours before or after surgery.  __x___ 4.  Do NOT smoke or use e-cigarettes for 24 HOURS PRIOR TO SURGERY.                      DO NOT Use any chewable tobacco products for at least 6 hours prior to surgery.  __x___ 5. If you start any new medication after this appointment and prior to surgery, please                    Bring it with you on the day of surgery.  ___x__ 6. Notify your doctor if there is any change  in your medical condition, such as fever,                   infection, vomitting, diarrhea or any open sores.  __x___ 7.  USE the CHG SOAP as instructed, the night before surgery and the day of surgery.                   Once you have washed with this soap, do NOT use any of the following: Powders, perfumes                    or lotions. Please do not wear make up, hairpins, clips or nail polish. You MAY wear deodorant.                   Men may shave their face and neck.  Women need to shave 48 hours prior to surgery.                   DO NOT wear ANY jewelry on the day of surgery. If there are rings that are too tight to                    remove easily, please address this prior to the surgery day. Piercings need to be removed.                                                                     NO METAL ON YOUR BODY.                    Do NOT bring any valuables.  If you came to Pre-Admit testing then you will not need license,                     insurance card or credit card.  If you will be staying overnight, please either leave your things in                     the car or have your family be responsible for these items.                     [facility] IS NOT RESPONSIBLE FOR BELONGINGS OR VALUABLES.  ___X__ 8. DO NOT wear contact lenses on surgery day.  You may not have dentures,                     Hearing aides, contacts or glasses in the operating room. These items can be                    Placed in the Recovery Room to receive immediately after surgery.  __x___ 9. IF YOU ARE SCHEDULED TO GO HOME ON THE SAME DAY, YOU MUST                   Have someone to drive you home and to stay with you  for the first 24 hours.                    Have an arrangement prior to arriving on surgery day.  ___x__ 10. Take the following medications  on the morning of surgery with a sip of water:                               1. TEGRETOL                     2. AMLODIPINE                     3. CYMBALTA                     4. TRAMADOL, if needed                     5.                      __x___ 11.  Follow any instructions provided to you by your surgeon.                        Such as enema, clear liquid bowel prep                    ##PLEASE CONSUME ALL OF THE CARBOHYDRATE SURGICAL DRINK                           BY 2 HOURS PRIOR TO ARRIVING AT [facility].##  __X__  12. STOP ALL ASPIRIN PRODUCTS AS OF 1 WEEK PRIOR TO SURGERY.(03/30/20)                       THIS INCLUDES BC POWDERS / GOODIES POWDER  __x___ 13. STOP Anti-inflammatories as of: 1 WEEK PRIOR TO SURGERY (03/30/20)                      This includes IBUPROFEN / MOTRIN / ADVIL / ALEVE/ NAPROXYN / VOLTAREN                    YOU MAY TAKE TYLENOL ANY TIME PRIOR TO SURGERY.  __X___ 14. You may continue taking Vitamin B12  but do not take on the morning of surgery.  ___X___17.  Continue to take the following medications but do not take on the morning of surgery:                          HYDROCHLOROTHIAZIDE (HCTZ) // FLEXERIL  __X____18. If staying overnight, please have appropriate shoes to wear to be able to walk around the unit.                   Wear clean and comfortable clothing to the hospital.  Please make sure to have stool softeners to use at home after surgery. If you bring a cell phone, remember to bring the charger.  If your Medical Directives are available to you , please bring with you so we can make a copy for    Your chart.   COVID TESTING ON June 30TH BETWEEN 8 AM TO 1 PM AT THE [facility].

## 2020-03-28 NOTE — Patient Instructions (Signed)
INSTRUCTIONS FOR SURGERY     Your surgery is scheduled for:        To find out your arrival time for the day of surgery,          please call [phone] between 1 pm and 3 pm on :     When you arrive for surgery, report to the [place].       Do NOT stop on the first floor to register.    REMEMBER: Instructions that are not followed completely may result in serious medical risk,  up to and including death, or upon the discretion of your surgeon and anesthesiologist,            your surgery may need to be rescheduled.  __X__ 1. Do not eat food after midnight the night before your procedure.                    No gum, candy, lozenger, tic tacs, tums or hard candies.                  ABSOLUTELY NOTHING SOLID IN YOUR MOUTH AFTER MIDNIGHT                    You may drink unlimited clear liquids up to 2 hours before you are scheduled to arrive for surgery.                   Do not drink anything within those 2 hours unless you need to take medicine, then take the                   smallest amount you need.  Clear liquids include:  water, apple juice without pulp,                   any flavor Gatorade, Black coffee, black tea.  Sugar may be added but no dairy/ honey /lemon.                        Broth and jello is not considered a clear liquid.  __x__  2. On the morning of surgery, please brush your teeth with toothpaste and water. You may rinse with                  mouthwash if you wish but DO NOT SWALLOW TOOTHPASTE OR MOUTHWASH  __X___3. NO alcohol for 24 hours before or after surgery.  __x___ 4.  Do NOT smoke or use e-cigarettes for 24 HOURS PRIOR TO SURGERY.                      DO NOT Use any chewable tobacco products for at least 6 hours prior to surgery.  __x___ 5. If you start any new medication after this appointment and prior to surgery, please                   Bring it with you on the day of  surgery.  ___x__ 6. Notify your doctor if there is any change in your medical condition, such as fever,  infection, vomitting,                   Diarrhea or any open sores.  __x___ 7.  USE the CHG SOAP as instructed, the night before surgery and the day of surgery.                   Once you have washed with this soap, do NOT use any of the following: Powders, perfumes                    or lotions. Please do not wear make up, hairpins, clips or nail polish. You MAY  wear deodorant.                   Men may shave their face and neck.  Women need to shave 48 hours prior to surgery.                   DO NOT wear ANY jewelry on the day of surgery. If there are rings that are too tight to                    remove easily, please address this prior to the surgery day. Piercings need to be removed.                                                                     NO METAL ON YOUR BODY.                    Do NOT bring any valuables.  If you came to Pre-Admit testing then you will not need license,                     insurance card or credit card.  If you will be staying overnight, please either leave your things in                     the car or have your family be responsible for these items.                     [facility] IS NOT RESPONSIBLE FOR BELONGINGS OR VALUABLES.  ___X__ 8. DO NOT wear contact lenses on surgery day.  You may not have dentures,                     Hearing aides, contacts or glasses in the operating room. These items can be                    Placed in the Recovery Room to receive immediately after surgery.  __x___ 9. IF YOU ARE SCHEDULED TO GO HOME ON THE SAME DAY, YOU MUST                   Have someone to drive you home and to stay with you  for the first 24 hours.                    Have an arrangement prior to arriving on surgery day.  ___x__ 10. Take the following medications on the morning of surgery with  a sip of water:                              1.                      2.                     3.                     4.                     5.                     6.  _____ 11.  Follow any instructions provided to you by your surgeon.                        Such as enema, clear liquid bowel prep  __X__  12. STOP COUMADIN / PLAVIX / ELIQUIS / ASPIRIN AS OF:                       THIS INCLUDES BC POWDERS / GOODIES POWDER  __x___ 13. STOP Anti-inflammatories as of:                       This includes IBUPROFEN / MOTRIN / ADVIL / ALEVE/ NAPROXYN                    YOU MAY TAKE TYLENOL ANY TIME PRIOR TO SURGERY.  _____ 64.  Stop supplements until after surgery.                     This includes:                 You may continue taking Vitamin B12 / Vitamin D3 but do not take on the morning of surgery.  ______17.  Continue to take the following medications but do not take on the morning of surgery:   ______18. If staying overnight, please have appropriate shoes to wear to be able to walk around the unit.                   Wear clean and comfortable clothing to the hospital.

## 2020-03-31 NOTE — Discharge Instructions (Signed)
Instructions after Total Hip Replacement     Bryan Frey, Jr., Bryan Frey.     Dept. of Orthopaedics & Sports Medicine  [facility]  [address]  [place], [place]  [address]  Phone: [phone]   Fax: [phone]    DIET: . Drink plenty of non-alcoholic fluids. . Resume your normal diet. Include foods high in fiber.  ACTIVITY:  . You may use crutches or a walker with weight-bearing as tolerated, unless instructed otherwise. . You may be weaned off of the walker or crutches by your Physical Therapist.  . Do NOT reach below the level of your knees or cross your legs until allowed.    . Continue doing gentle exercises. Exercising will reduce the pain and swelling, increase motion, and prevent muscle weakness.   . Please continue to use the TED compression stockings for 6 weeks. You may remove the stockings at night, but should reapply them in the morning. . Do not drive or operate any equipment until instructed.  WOUND CARE:  . Continue to use ice packs periodically to reduce pain and swelling. . Keep the incision clean and dry. . You may bathe or shower after the staples are removed at the first office visit following surgery.  MEDICATIONS: . You may resume your regular medications. . Please take the pain medication as prescribed on the medication. . Do not take pain medication on an empty stomach. . You have been given a prescription for a blood thinner to prevent blood clots. Please take the medication as instructed. (NOTE: After completing a 2 week course of Lovenox, take one Enteric-coated aspirin once a day.) . Pain medications and iron supplements can cause constipation. Use a stool softener (Senokot or Colace) on a daily basis and a laxative (dulcolax or miralax) as needed. . Do not drive or drink alcoholic beverages when taking pain medications.  CALL THE OFFICE FOR: . Temperature above 101 degrees . Excessive bleeding or drainage on the dressing. . Excessive  swelling, coldness, or paleness of the toes. . Persistent nausea and vomiting.  FOLLOW-UP:  . You should have an appointment to return to the office in 6 weeks after surgery. . Arrangements have been made for continuation of Physical Therapy (either home therapy or outpatient therapy).     [facility] Department Directory         [email or web address]       [email or web address]          Cardiology  Appointments: [place] - [phone] [place] - [phone]  Endocrinology  Appointments: [place] - [phone] [place] - [phone]  Gastroenterology  Appointments: [place] - [phone] [place] - [phone]        General Surgery   Appointments: [place] - [phone]  Internal Medicine/Family Medicine  Appointments: [place] - [phone] [place] - [phone] [place] - [phone]  Metabolic and Weigh Loss Surgery  Appointments: [place] - [phone]        Neurology  Appointments: [place] - [phone] [place] - [phone]  Neurosurgery  Appointments: [place] - [phone]  Obstetrics & Gynecology  Appointments: [place] - [phone] [place] - [phone]        Pediatrics  Appointments: [place] - [phone] [place] - [phone]  Physiatry  Appointments: [place] -[phone]  Physical Therapy  Appointments: [place] - [phone] [place] - [phone]        Podiatry  Appointments: [place] - [phone] [place] - [phone]  Pulmonology  Appointments: [place] - [phone]  Rheumatology  Appointments: [place] - [phone]         Location: [facility]  [address] [place], [place]  [address]  [place] Location: [facility] [address] [place], [place]  [address]  [place] Location: [facility] [address] [place], [place]  [address]    

## 2020-03-31 NOTE — H&P (Signed)
ORTHOPAEDIC HISTORY & PHYSICAL  Progress Notes Dafina Suk, Florinda Marker., MD - 03/06/2020 11:00 AM EDT Chief Complaint: Chief Complaint  Patient presents with  . Hip Pain  left hip pain, left tha 08/07/10. had a ct scan.   Reason for Visit: The patient is a 74 y.o. male who presents today with his wife for reevaluation of his left hip. He underwent left total hip arthroplasty on 08/07/2010 followed by revision of the acetabular component on 05/14/2015. He has a neuropathy with a significant gait abnormality and is followed by Neurology. He has noticed progressive weakness to the left lower extremity and requires the use of a walker for ambulation. He has also noticed progression of his limb length inequality. He localizes pain to the anterior left thigh. He denies any gross groin pain. The hip/thigh symptoms prevent him from ambulating significant distances. The patient states that the hip pain has progressed to the point that it is significantly interfering with his activities of daily living.  Medications: Current Outpatient Medications  Medication Sig Dispense Refill  . acetaminophen (TYLENOL ARTHRITIS PAIN ORAL) Take by mouth  . amLODIPine (NORVASC) 10 MG tablet Take 1 tablet (10 mg total) by mouth once daily 90 tablet 1  . betamethasone dipropionate (DIPROLENE) 0.05 % cream APPLY EXTERNALLY TO THE AFFECTED AREA TWICE DAILY AS NEEDED 45 g 0  . carBAMazepine (TEGRETOL) 200 mg tablet TAKE 2 TABLETS BY MOUTH 3 TIMES A DAY**TEVA BRAND ONLY** 540 tablet 4  . cyanocobalamin (VITAMIN B12) 1000 MCG tablet Take 1,000 mcg by mouth once daily  . cyclobenzaprine (FLEXERIL) 10 MG tablet Take 1 tablet (10 mg total) by mouth 3 (three) times daily as needed for Muscle spasms 90 tablet 1  . DULoxetine (CYMBALTA) 60 MG DR capsule Take 2 capsules (120 mg total) by mouth once daily 180 capsule 3  . hydroCHLOROthiazide (HYDRODIURIL) 25 MG tablet Take 1 tablet (25 mg total) by mouth once daily 90 tablet 3  .  melatonin 5 mg Cap Take by mouth nightly.  . pregabalin (LYRICA) 50 MG capsule  . traMADoL (ULTRAM) 50 mg tablet Take 2 tablets (100 mg total) by mouth 2 (two) times daily 360 tablet 1   No current facility-administered medications for this visit.   Allergies: Allergies  Allergen Reactions  . Morphine Hallucination  . Statins-Hmg-Coa Reductase Inhibitors Muscle Pain   Past Medical History: Past Medical History:  Diagnosis Date  . Chickenpox  . Erectile dysfunction  . History of colonoscopy  . Hyperlipidemia  . Hypertension  . Internuclear ophthalmoplegia  . Kidney stones  . Leg length discrepancy  . S/P myelogram 05/20/2016  Dr Ellene Route  . Spinal stenosis of lumbar region  . Trigeminal neuralgia 10/13/2016  Controlled with carbamazepine   Past Surgical History: Past Surgical History:  Procedure Laterality Date  . ANTERIOR FUSION LUMBAR SPINE 2017  L2-5  . BACK SURGERY  LUMBOSACRAL SPINE SURGERY  . Left total hip arthroplasty 08/07/2010  . Revision of left acetabular component 05/14/2015  Dr.Everardo Voris  . Right total hip arthroplasty 07/10/2014   Social History: Social History   Socioeconomic History  . Marital status: Married  Spouse name: Helene Kelp  . Number of children: 2  . Years of education: 24  . Highest education level: Not on file  Occupational History  . Occupation: Retired  Tobacco Use  . Smoking status: Never Smoker  . Smokeless tobacco: Never Used  Vaping Use  . Vaping Use: Never used  Substance and Sexual Activity  . Alcohol use:  No  Alcohol/week: 0.0 standard drinks  . Drug use: No  . Sexual activity: Yes  Partners: Female  Other Topics Concern  . Not on file  Social History Narrative  Lives in [place]. Married. He is active in church. Denies smoking and alcohol intake.   Social Determinants of Health   Financial Resource Strain:  . Difficulty of Paying Living Expenses:  Food Insecurity:  . Worried About Charity fundraiser in the Last  Year:  . Arboriculturist in the Last Year:  Transportation Needs:  . Film/video editor (Medical):  Marland Kitchen Lack of Transportation (Non-Medical):  Physical Activity:  . Days of Exercise per Week:  . Minutes of Exercise per Session:  Stress:  . Feeling of Stress :  Social Connections:  . Frequency of Communication with Friends and Family:  . Frequency of Social Gatherings with Friends and Family:  . Attends Religious Services:  . Active Member of Clubs or Organizations:  . Attends Archivist Meetings:  Marland Kitchen Marital Status:   Family History: Family History  Problem Relation Age of Onset  . Diabetes Father  . Diabetes Sister  . Arthritis Mother  . Myopathy Neg Hx   Review of Systems: A comprehensive 14 point ROS was performed, reviewed, and the pertinent orthopaedic findings are documented in the HPI.  Exam BP 126/72 (BP Location: Left upper arm, Patient Position: Sitting, BP Cuff Size: Adult)  Temp 36.6 C (97.8 F) (Oral)  Ht 180.3 cm (5\' 11" )  Wt (!) 102.1 kg (225 lb)  BMI 31.38 kg/m   General:  Well-developed, well-nourished male seen in no acute distress. Unsteady gait with significant abductor lurch.  HEENT:  Atraumatic, normocephalic. Pupils are equal and reactive to light. Extraocular motion is intact. Sclera are clear. Oropharynx is clear with moist mucosa.  Neck:  Supple, nontender, and with good ROM. No thyromegaly, adenopathy, JVD, or carotid bruits.  Lungs:  Clear to auscultation bilaterally.  Cardiovascular:  Regular rate and rhythm. Normal S1, S2. No murmur . No appreciable gallops or rubs. Peripheral pulses are palpable. No lower extremity edema. Homan`s test is negative.  Abdomen:  Soft, nontender, nondistended. Bowel sounds are present.  Spine: Alignment: No gross scoliosis. Normal lumbar lordosis. Sacroiliac joints: Nontender to palpation Patrick`s test: Negative Flip test: Negative Tenderness: Moderate Paraspinous spasm: None Range  of motion: Limited range of motion with both flexion and extension  Extremities: Good strength, stability, and range of motion of the upper extremities. Good range of motion of the knees and ankles.  Left Hip: Pelvic tilt: Positive Limb lengths: Lef tlower extremity is shorter than the right  Soft tissue swelling: Negative Erythema: Negative Crepitance: Negative Tenderness: Greater trochanter is nontender to palpation. Mild pain is elicited by axial compression or extremes of rotation. Atrophy: Moderate atrophy to the thigh. Poor hip flexor strength. Range of Motion: Good passive range of motion of the left hip  Vascular: Peripheral pulses are palpable. Good capillary refill. No gross pretibial or ankle edema. Homans test is negative.  Neurologic:  Awake, alert, and oriented.  Sensory function is intact to pinprick and light touch with the exception of decreased discrimination to the left lower leg.  Motor strength is judged to be 5/5 except as noted above.  Motor coordination is grossly within normal limits.  No apparent clonus. No tremor.   X-rays: I reviewed the left hip radiographs that were performed at [facility] on 02/13/2020. Total hip implants are noted with gross vertical  positioning of the acetabular component. There is evidence of loosening and polyethylene wear. No significant heterotopic ossification. No evidence of fracture or dislocation.   CT scan: I reviewed the left hip CT scan performed at [facility] on 02/28/2020. I concur with the radiologist's interpretation as below:  TECHNIQUE:  Multidetector CT imaging of the left hip was performed according to  the standard protocol. Multiplanar CT image reconstructions were  also generated.   COMPARISON: None.   FINDINGS:  Bones/Joint/Cartilage   No acute fracture or dislocation.   Left total hip arthroplasty with beam hardening artifact partially  obscuring the adjacent soft tissue  and osseous structures.  Acetabular cup is medially rotated with a relative vertical  orientation and uncovering of the lateral half of the superior  femoral head component. Lucency around the acetabular cup component  and superior most screw concerning for loosening.   No aggressive osseous lesion. No joint effusion.   Ligaments   Ligaments are suboptimally evaluated by CT.   Muscles and Tendons  Muscles are normal. No muscle atrophy. No intramuscular fluid  collection or hematoma.   Soft tissue  No fluid collection or hematoma. No soft tissue mass.   IMPRESSION:  Left total hip arthroplasty with beam hardening artifact partially  obscuring the adjacent soft tissue and osseous structures.  Acetabular cup is medially rotated with a relative vertical  orientation and uncovering of the lateral half of the superior  femoral head component. Lucency around the acetabular cup component  and superior most screw concerning for loosening. The   Electronically Signed  By: Kathreen Devoid  On: 02/28/2020 17:00  Impression: Loosening of the acetabular component, status post left total hip arthroplasty   Plan:  The findings were discussed in detail with the patient.  Conservative treatment options were reviewed with the patient. We discussed the risks and benefits of surgical intervention. The usual perioperative course was also discussed in detail. I am concerned about the patient's profound neuropathy and the manner in which it will affect his recovery. The patient expressed understanding of the risks and benefits of surgical intervention and would like to proceed with plans for left total hip revision arthroplasty.  MEDICAL CLEARANCE: Per anesthesiology ACTIVITIES:  As tolerated. WORK STATUS: Not applicable. THERAPY: None MEDICATIONS: Requested Prescriptions   No prescriptions requested or ordered in this encounter   FOLLOW-UP: Return for preop History & Physical pending  surgery date.  Devian P. Holley Bouche., M.D.  This note was generated in part with voice recognition software and I apologize for any typographical errors that were not detected and corrected.  Answers for HPI/ROS submitted by the patient on 03/05/2020 How did your symptoms begin?: gradually without injury How long ago did your symptoms begin?: 5 years Please indicate all symptoms related to your issue: giving way, instability, pain, weakness Where is your pain located? (select all that apply): left hip Please select what best describes your pain (choose all that apply): aching, sharp, stabbing Please choose the severity that best describes your pain: marked (major pain with significant limitations) Rate the pain on a scale of 0 (no pain) to 10 (worst pain ever): 9/10 Rate your current activity of daily living as % of normal: 10% How often does the pain occur?: intermittent Since the onset of your problem, has the pain improved, worsened, or stayed the same?: worsening Please choose all the items that improve your symptoms: acetaminophen, elevation, rest Please choose all the items that worsen your symptoms: activity, getting  in and out of a car, normal daily activities, rising from a chair, using stairs Have you had an injection for your hip?: Yes Please select any of the gait aids you use to ambulate (choose all that apply): uses a cane or walker What is your current ambulatory (walking) status?: can walk a short distance outdoors (one to four blocks) Have you had surgery for this problem before?: Yes Have you had specialized imaging of your hip?: Yes Did the injection help your symptoms (even for a short time)?: No Did the surgery help?: No Has your surgeon referred you for a second opinion?: No What imaging has been performed on your hip? (select all that apply): CT    Electronically signed by Lamar Benes., MD at 03/11/2020 7:23 PM EDT

## 2020-04-02 DIAGNOSIS — T84038A Mechanical loosening of other internal prosthetic joint, initial encounter: Secondary | ICD-10-CM | POA: Diagnosis not present

## 2020-04-02 DIAGNOSIS — Z96649 Presence of unspecified artificial hip joint: Secondary | ICD-10-CM | POA: Diagnosis not present

## 2020-04-03 ENCOUNTER — Other Ambulatory Visit: Payer: Self-pay

## 2020-04-03 ENCOUNTER — Encounter: Payer: Self-pay | Admitting: Urgent Care

## 2020-04-03 ENCOUNTER — Encounter
Admission: RE | Admit: 2020-04-03 | Discharge: 2020-04-03 | Disposition: A | Discharge status: Home / Self Care | Payer: PPO | Source: Ambulatory Visit | Attending: Orthopedic Surgery | Admitting: Orthopedic Surgery

## 2020-04-03 ENCOUNTER — Ambulatory Visit
Admission: RE | Admit: 2020-04-03 | Discharge: 2020-04-03 | Disposition: A | Discharge status: Home / Self Care | Payer: PPO | Source: Ambulatory Visit | Attending: Urgent Care | Admitting: Urgent Care

## 2020-04-03 DIAGNOSIS — Z01818 Encounter for other preprocedural examination: Secondary | ICD-10-CM | POA: Insufficient documentation

## 2020-04-03 DIAGNOSIS — H5120 Internuclear ophthalmoplegia, unspecified eye: Secondary | ICD-10-CM | POA: Diagnosis present

## 2020-04-03 DIAGNOSIS — Z8261 Family history of arthritis: Secondary | ICD-10-CM | POA: Diagnosis not present

## 2020-04-03 DIAGNOSIS — I10 Essential (primary) hypertension: Secondary | ICD-10-CM | POA: Diagnosis present

## 2020-04-03 DIAGNOSIS — Z79899 Other long term (current) drug therapy: Secondary | ICD-10-CM | POA: Diagnosis not present

## 2020-04-03 DIAGNOSIS — E785 Hyperlipidemia, unspecified: Secondary | ICD-10-CM | POA: Diagnosis present

## 2020-04-03 DIAGNOSIS — N529 Male erectile dysfunction, unspecified: Secondary | ICD-10-CM | POA: Diagnosis present

## 2020-04-03 DIAGNOSIS — Z833 Family history of diabetes mellitus: Secondary | ICD-10-CM | POA: Diagnosis not present

## 2020-04-03 DIAGNOSIS — M2170 Unequal limb length (acquired), unspecified site: Secondary | ICD-10-CM | POA: Diagnosis present

## 2020-04-03 DIAGNOSIS — G629 Polyneuropathy, unspecified: Secondary | ICD-10-CM | POA: Diagnosis present

## 2020-04-03 DIAGNOSIS — Z96643 Presence of artificial hip joint, bilateral: Secondary | ICD-10-CM | POA: Diagnosis present

## 2020-04-03 DIAGNOSIS — Z20822 Contact with and (suspected) exposure to covid-19: Secondary | ICD-10-CM | POA: Diagnosis present

## 2020-04-03 DIAGNOSIS — Y792 Prosthetic and other implants, materials and accessory orthopedic devices associated with adverse incidents: Secondary | ICD-10-CM | POA: Diagnosis present

## 2020-04-03 DIAGNOSIS — Z471 Aftercare following joint replacement surgery: Secondary | ICD-10-CM | POA: Diagnosis not present

## 2020-04-03 DIAGNOSIS — T84031A Mechanical loosening of internal left hip prosthetic joint, initial encounter: Secondary | ICD-10-CM | POA: Diagnosis present

## 2020-04-03 DIAGNOSIS — E7800 Pure hypercholesterolemia, unspecified: Secondary | ICD-10-CM | POA: Diagnosis present

## 2020-04-03 DIAGNOSIS — M25552 Pain in left hip: Secondary | ICD-10-CM | POA: Diagnosis present

## 2020-04-03 DIAGNOSIS — J90 Pleural effusion, not elsewhere classified: Secondary | ICD-10-CM | POA: Diagnosis not present

## 2020-04-03 DIAGNOSIS — Z87442 Personal history of urinary calculi: Secondary | ICD-10-CM | POA: Diagnosis not present

## 2020-04-03 DIAGNOSIS — Z8673 Personal history of transient ischemic attack (TIA), and cerebral infarction without residual deficits: Secondary | ICD-10-CM | POA: Diagnosis not present

## 2020-04-03 DIAGNOSIS — M48061 Spinal stenosis, lumbar region without neurogenic claudication: Secondary | ICD-10-CM | POA: Diagnosis present

## 2020-04-03 DIAGNOSIS — Z96642 Presence of left artificial hip joint: Secondary | ICD-10-CM | POA: Diagnosis not present

## 2020-04-03 LAB — CBC WITH DIFFERENTIAL/PLATELET
Abs Immature Granulocytes: 0.04 10*3/uL (ref 0.00–0.07)
Basophils Absolute: 0.1 10*3/uL (ref 0.0–0.1)
Basophils Relative: 1 %
Eosinophils Absolute: 0.8 10*3/uL — ABNORMAL HIGH (ref 0.0–0.5)
Eosinophils Relative: 11 %
HCT: 40.4 % (ref 39.0–52.0)
Hemoglobin: 13.2 g/dL (ref 13.0–17.0)
Immature Granulocytes: 1 %
Lymphocytes Relative: 22 %
Lymphs Abs: 1.8 10*3/uL (ref 0.7–4.0)
MCH: 30.6 pg (ref 26.0–34.0)
MCHC: 32.7 g/dL (ref 30.0–36.0)
MCV: 93.5 fL (ref 80.0–100.0)
Monocytes Absolute: 0.6 10*3/uL (ref 0.1–1.0)
Monocytes Relative: 7 %
Neutro Abs: 4.7 10*3/uL (ref 1.7–7.7)
Neutrophils Relative %: 58 %
Platelets: 381 10*3/uL (ref 150–400)
RBC: 4.32 MIL/uL (ref 4.22–5.81)
RDW: 12.6 % (ref 11.5–15.5)
WBC: 8 10*3/uL (ref 4.0–10.5)
nRBC: 0 % (ref 0.0–0.2)

## 2020-04-03 LAB — COMPREHENSIVE METABOLIC PANEL
ALT: 24 U/L (ref 0–44)
AST: 22 U/L (ref 15–41)
Albumin: 4 g/dL (ref 3.5–5.0)
Alkaline Phosphatase: 147 U/L — ABNORMAL HIGH (ref 38–126)
Anion gap: 12 (ref 5–15)
BUN: 21 mg/dL (ref 8–23)
CO2: 30 mmol/L (ref 22–32)
Calcium: 9.1 mg/dL (ref 8.9–10.3)
Chloride: 98 mmol/L (ref 98–111)
Creatinine, Ser: 1.2 mg/dL (ref 0.61–1.24)
GFR calc Af Amer: >60 mL/min (ref >60)
GFR calc non Af Amer: 59 mL/min — ABNORMAL LOW (ref >60)
Glucose, Bld: 112 mg/dL — ABNORMAL HIGH (ref 70–99)
Potassium: 4.3 mmol/L (ref 3.5–5.1)
Sodium: 140 mmol/L (ref 135–145)
Total Bilirubin: 0.6 mg/dL (ref 0.3–1.2)
Total Protein: 7.7 g/dL (ref 6.5–8.1)

## 2020-04-03 LAB — URINALYSIS, ROUTINE W REFLEX MICROSCOPIC
Bilirubin Urine: NEGATIVE
Glucose, UA: NEGATIVE mg/dL
Hgb urine dipstick: NEGATIVE
Ketones, ur: NEGATIVE mg/dL
Leukocytes,Ua: NEGATIVE
Nitrite: NEGATIVE
Protein, ur: NEGATIVE mg/dL
Specific Gravity, Urine: 1.02 (ref 1.005–1.030)
pH: 5 (ref 5.0–8.0)

## 2020-04-03 LAB — SURGICAL PCR SCREEN
MRSA, PCR: NEGATIVE
Staphylococcus aureus: NEGATIVE

## 2020-04-03 LAB — APTT: aPTT: 29 seconds (ref 24–36)

## 2020-04-03 LAB — PROTIME-INR
INR: 1 (ref 0.8–1.2)
Prothrombin Time: 13 seconds (ref 11.4–15.2)

## 2020-04-03 LAB — C-REACTIVE PROTEIN: CRP: 3.4 mg/dL — ABNORMAL HIGH (ref <1.0)

## 2020-04-03 LAB — SEDIMENTATION RATE: Sed Rate: 58 mm/hr — ABNORMAL HIGH (ref 0–20)

## 2020-04-04 ENCOUNTER — Other Ambulatory Visit
Admission: RE | Admit: 2020-04-04 | Discharge: 2020-04-04 | Disposition: A | Discharge status: Home / Self Care | Payer: PPO | Source: Ambulatory Visit | Attending: Orthopedic Surgery | Admitting: Orthopedic Surgery

## 2020-04-04 LAB — SARS CORONAVIRUS 2 (TAT 6-24 HRS): SARS Coronavirus 2: NEGATIVE

## 2020-04-05 LAB — URINE CULTURE
Culture: 30000 — AB
Special Requests: NORMAL

## 2020-04-05 LAB — TYPE AND SCREEN
ABO/RH(D): O NEG
Antibody Screen: NEGATIVE
DAT, IgG: NEGATIVE
Weak D: POSITIVE

## 2020-04-06 ENCOUNTER — Other Ambulatory Visit: Payer: Self-pay

## 2020-04-06 ENCOUNTER — Inpatient Hospital Stay: Payer: PPO

## 2020-04-06 ENCOUNTER — Inpatient Hospital Stay: Payer: PPO | Admitting: Urgent Care

## 2020-04-06 ENCOUNTER — Inpatient Hospital Stay
Admission: RE | Admit: 2020-04-06 | Discharge: 2020-04-08 | DRG: 468 | Disposition: A | Discharge status: Home Health | Payer: PPO | Attending: Orthopedic Surgery | Admitting: Orthopedic Surgery

## 2020-04-06 ENCOUNTER — Encounter
Admission: RE | Disposition: A | Discharge status: Home / Self Care | Payer: Self-pay | Source: Home / Self Care | Attending: Orthopedic Surgery

## 2020-04-06 ENCOUNTER — Inpatient Hospital Stay: Payer: PPO | Admitting: Certified Registered"

## 2020-04-06 ENCOUNTER — Encounter: Payer: Self-pay | Admitting: Orthopedic Surgery

## 2020-04-06 DIAGNOSIS — M48061 Spinal stenosis, lumbar region without neurogenic claudication: Secondary | ICD-10-CM | POA: Diagnosis present

## 2020-04-06 DIAGNOSIS — Z79899 Other long term (current) drug therapy: Secondary | ICD-10-CM

## 2020-04-06 DIAGNOSIS — Y792 Prosthetic and other implants, materials and accessory orthopedic devices associated with adverse incidents: Secondary | ICD-10-CM | POA: Diagnosis present

## 2020-04-06 DIAGNOSIS — T84031A Mechanical loosening of internal left hip prosthetic joint, initial encounter: Principal | ICD-10-CM | POA: Diagnosis present

## 2020-04-06 DIAGNOSIS — Z96643 Presence of artificial hip joint, bilateral: Secondary | ICD-10-CM | POA: Diagnosis present

## 2020-04-06 DIAGNOSIS — H5120 Internuclear ophthalmoplegia, unspecified eye: Secondary | ICD-10-CM | POA: Diagnosis present

## 2020-04-06 DIAGNOSIS — N529 Male erectile dysfunction, unspecified: Secondary | ICD-10-CM | POA: Diagnosis present

## 2020-04-06 DIAGNOSIS — I10 Essential (primary) hypertension: Secondary | ICD-10-CM | POA: Diagnosis present

## 2020-04-06 DIAGNOSIS — Z87442 Personal history of urinary calculi: Secondary | ICD-10-CM

## 2020-04-06 DIAGNOSIS — Z96649 Presence of unspecified artificial hip joint: Secondary | ICD-10-CM

## 2020-04-06 DIAGNOSIS — E7800 Pure hypercholesterolemia, unspecified: Secondary | ICD-10-CM | POA: Diagnosis present

## 2020-04-06 DIAGNOSIS — G629 Polyneuropathy, unspecified: Secondary | ICD-10-CM | POA: Diagnosis present

## 2020-04-06 DIAGNOSIS — Z471 Aftercare following joint replacement surgery: Secondary | ICD-10-CM | POA: Diagnosis not present

## 2020-04-06 DIAGNOSIS — E785 Hyperlipidemia, unspecified: Secondary | ICD-10-CM | POA: Diagnosis present

## 2020-04-06 DIAGNOSIS — Z8261 Family history of arthritis: Secondary | ICD-10-CM | POA: Diagnosis not present

## 2020-04-06 DIAGNOSIS — M2170 Unequal limb length (acquired), unspecified site: Secondary | ICD-10-CM | POA: Diagnosis present

## 2020-04-06 DIAGNOSIS — Z96642 Presence of left artificial hip joint: Secondary | ICD-10-CM | POA: Diagnosis not present

## 2020-04-06 DIAGNOSIS — Z20822 Contact with and (suspected) exposure to covid-19: Secondary | ICD-10-CM | POA: Diagnosis present

## 2020-04-06 DIAGNOSIS — Z833 Family history of diabetes mellitus: Secondary | ICD-10-CM

## 2020-04-06 DIAGNOSIS — Z419 Encounter for procedure for purposes other than remedying health state, unspecified: Secondary | ICD-10-CM

## 2020-04-06 DIAGNOSIS — Z8673 Personal history of transient ischemic attack (TIA), and cerebral infarction without residual deficits: Secondary | ICD-10-CM | POA: Diagnosis not present

## 2020-04-06 DIAGNOSIS — M25552 Pain in left hip: Secondary | ICD-10-CM | POA: Diagnosis present

## 2020-04-06 HISTORY — DX: Unequal limb length (acquired), unspecified site: M21.70

## 2020-04-06 HISTORY — DX: Other intervertebral disc degeneration, lumbar region without mention of lumbar back pain or lower extremity pain: M51.369

## 2020-04-06 HISTORY — DX: Anemia, unspecified: D64.9

## 2020-04-06 HISTORY — DX: Spinal stenosis, lumbar region without neurogenic claudication: M48.061

## 2020-04-06 HISTORY — PX: TOTAL HIP REVISION: SHX763

## 2020-04-06 HISTORY — DX: Other intervertebral disc degeneration, lumbar region: M51.36

## 2020-04-06 SURGERY — TOTAL HIP REVISION
Anesthesia: General | Site: Hip | Laterality: Left

## 2020-04-06 MED ORDER — LIDOCAINE HCL (CARDIAC) PF 100 MG/5ML IV SOSY
PREFILLED_SYRINGE | INTRAVENOUS | Status: DC | PRN
  Administered 2020-04-06: 80 mg via INTRAVENOUS

## 2020-04-06 MED ORDER — TRANEXAMIC ACID-NACL 1000-0.7 MG/100ML-% IV SOLN
INTRAVENOUS | Status: AC
  Administered 2020-04-06: 1000 mg via INTRAVENOUS
  Filled 2020-04-06: qty 100

## 2020-04-06 MED ORDER — TRAMADOL HCL 50 MG PO TABS: 50.0000 mg | ORAL_TABLET | ORAL | Status: DC | PRN

## 2020-04-06 MED ORDER — ORAL CARE MOUTH RINSE: 15.0000 mL | Freq: Once | OROMUCOSAL | Status: AC

## 2020-04-06 MED ORDER — ACETAMINOPHEN 10 MG/ML IV SOLN
INTRAVENOUS | Status: AC
  Filled 2020-04-06: qty 100

## 2020-04-06 MED ORDER — DEXMEDETOMIDINE HCL IN NACL 80 MCG/20ML IV SOLN
INTRAVENOUS | Status: AC
  Filled 2020-04-06: qty 20

## 2020-04-06 MED ORDER — TRANEXAMIC ACID-NACL 1000-0.7 MG/100ML-% IV SOLN
1000.0000 mg | INTRAVENOUS | Status: AC
  Administered 2020-04-06: 1000 mg via INTRAVENOUS

## 2020-04-06 MED ORDER — CHLORHEXIDINE GLUCONATE 0.12 % MT SOLN
OROMUCOSAL | Status: AC
  Administered 2020-04-06: 15 mL via OROMUCOSAL
  Filled 2020-04-06: qty 15

## 2020-04-06 MED ORDER — BISACODYL 10 MG RE SUPP
10.0000 mg | Freq: Every day | RECTAL | Status: DC | PRN
  Administered 2020-04-08: 10 mg via RECTAL
  Filled 2020-04-06: qty 1

## 2020-04-06 MED ORDER — HYDROMORPHONE HCL 1 MG/ML IJ SOLN
INTRAMUSCULAR | Status: DC | PRN
  Administered 2020-04-06: 1 mg via INTRAVENOUS

## 2020-04-06 MED ORDER — DULOXETINE HCL 60 MG PO CPEP
120.0000 mg | ORAL_CAPSULE | Freq: Every day | ORAL | Status: DC
  Administered 2020-04-07 – 2020-04-08 (×2): 120 mg via ORAL
  Filled 2020-04-06 (×2): qty 2

## 2020-04-06 MED ORDER — CEFAZOLIN SODIUM 1 G IJ SOLR
INTRAMUSCULAR | Status: AC
  Filled 2020-04-06: qty 20

## 2020-04-06 MED ORDER — MAGNESIUM HYDROXIDE 400 MG/5ML PO SUSP
30.0000 mL | Freq: Every day | ORAL | Status: DC
  Administered 2020-04-07 – 2020-04-08 (×2): 30 mL via ORAL
  Filled 2020-04-06 (×2): qty 30

## 2020-04-06 MED ORDER — CHLORHEXIDINE GLUCONATE 0.12 % MT SOLN: 15.0000 mL | Freq: Once | OROMUCOSAL | Status: AC

## 2020-04-06 MED ORDER — ENSURE ENLIVE PO LIQD: 296.0000 mL | Freq: Once | ORAL | Status: DC

## 2020-04-06 MED ORDER — ENOXAPARIN SODIUM 30 MG/0.3ML ~~LOC~~ SOLN
30.0000 mg | Freq: Two times a day (BID) | SUBCUTANEOUS | Status: DC
  Administered 2020-04-07 – 2020-04-08 (×3): 30 mg via SUBCUTANEOUS
  Filled 2020-04-06 (×3): qty 0.3

## 2020-04-06 MED ORDER — LACTATED RINGERS IV SOLN: INTRAVENOUS | Status: DC

## 2020-04-06 MED ORDER — ONDANSETRON HCL 4 MG/2ML IJ SOLN: 4.0000 mg | Freq: Four times a day (QID) | INTRAMUSCULAR | Status: DC | PRN

## 2020-04-06 MED ORDER — FLEET ENEMA 7-19 GM/118ML RE ENEM
1.0000 | ENEMA | Freq: Once | RECTAL | Status: AC | PRN
  Administered 2020-04-08: 1 via RECTAL

## 2020-04-06 MED ORDER — FAMOTIDINE 20 MG PO TABS
ORAL_TABLET | ORAL | Status: AC
  Administered 2020-04-06: 20 mg via ORAL
  Filled 2020-04-06: qty 1

## 2020-04-06 MED ORDER — ALBUMIN HUMAN 5 % IV SOLN: INTRAVENOUS | Status: DC | PRN

## 2020-04-06 MED ORDER — DEXAMETHASONE SODIUM PHOSPHATE 10 MG/ML IJ SOLN
INTRAMUSCULAR | Status: AC
  Administered 2020-04-06: 8 mg via INTRAVENOUS
  Filled 2020-04-06: qty 1

## 2020-04-06 MED ORDER — METOCLOPRAMIDE HCL 10 MG PO TABS: 5.0000 mg | ORAL_TABLET | Freq: Three times a day (TID) | ORAL | Status: DC | PRN

## 2020-04-06 MED ORDER — SODIUM CHLORIDE 0.9 % IV SOLN: INTRAVENOUS | Status: DC

## 2020-04-06 MED ORDER — PHENOL 1.4 % MT LIQD
1.0000 | OROMUCOSAL | Status: DC | PRN
  Filled 2020-04-06: qty 177

## 2020-04-06 MED ORDER — DIPHENHYDRAMINE HCL 12.5 MG/5ML PO ELIX: 12.5000 mg | ORAL_SOLUTION | ORAL | Status: DC | PRN

## 2020-04-06 MED ORDER — ALBUMIN HUMAN 5 % IV SOLN
INTRAVENOUS | Status: AC
  Filled 2020-04-06: qty 250

## 2020-04-06 MED ORDER — ALUM & MAG HYDROXIDE-SIMETH 200-200-20 MG/5ML PO SUSP: 30.0000 mL | ORAL | Status: DC | PRN

## 2020-04-06 MED ORDER — CELECOXIB 200 MG PO CAPS
200.0000 mg | ORAL_CAPSULE | Freq: Two times a day (BID) | ORAL | Status: DC
  Administered 2020-04-06 – 2020-04-08 (×4): 200 mg via ORAL
  Filled 2020-04-06 (×4): qty 1

## 2020-04-06 MED ORDER — ACETAMINOPHEN 10 MG/ML IV SOLN
INTRAVENOUS | Status: DC | PRN
Start: 2020-04-06 — End: 2020-04-06
  Administered 2020-04-06: 1000 mg via INTRAVENOUS

## 2020-04-06 MED ORDER — AMLODIPINE BESYLATE 10 MG PO TABS
10.0000 mg | ORAL_TABLET | Freq: Every day | ORAL | Status: DC
  Administered 2020-04-06 – 2020-04-08 (×3): 10 mg via ORAL
  Filled 2020-04-06 (×3): qty 1

## 2020-04-06 MED ORDER — ROCURONIUM 10MG/ML (10ML) SYRINGE FOR MEDFUSION PUMP - OPTIME
INTRAVENOUS | Status: DC | PRN
Start: 2020-04-06 — End: 2020-04-06
  Administered 2020-04-06 (×2): 50 mg via INTRAVENOUS

## 2020-04-06 MED ORDER — CARBAMAZEPINE 200 MG PO TABS
400.0000 mg | ORAL_TABLET | Freq: Three times a day (TID) | ORAL | Status: DC
  Administered 2020-04-06 – 2020-04-08 (×6): 400 mg via ORAL
  Filled 2020-04-06 (×8): qty 2

## 2020-04-06 MED ORDER — ACETAMINOPHEN 10 MG/ML IV SOLN
1000.0000 mg | Freq: Four times a day (QID) | INTRAVENOUS | Status: AC
  Administered 2020-04-06 – 2020-04-07 (×4): 1000 mg via INTRAVENOUS
  Filled 2020-04-06 (×6): qty 100

## 2020-04-06 MED ORDER — NEOMYCIN-POLYMYXIN B GU 40-200000 IR SOLN
Status: DC | PRN
  Administered 2020-04-06: 16 mL

## 2020-04-06 MED ORDER — TRANEXAMIC ACID-NACL 1000-0.7 MG/100ML-% IV SOLN
INTRAVENOUS | Status: AC
  Filled 2020-04-06: qty 100

## 2020-04-06 MED ORDER — ONDANSETRON HCL 4 MG/2ML IJ SOLN
INTRAMUSCULAR | Status: AC
  Filled 2020-04-06: qty 2

## 2020-04-06 MED ORDER — OXYCODONE HCL 5 MG PO TABS: 10.0000 mg | ORAL_TABLET | ORAL | Status: DC | PRN

## 2020-04-06 MED ORDER — PANTOPRAZOLE SODIUM 40 MG PO TBEC
40.0000 mg | DELAYED_RELEASE_TABLET | Freq: Two times a day (BID) | ORAL | Status: DC
  Administered 2020-04-06 – 2020-04-08 (×4): 40 mg via ORAL
  Filled 2020-04-06 (×4): qty 1

## 2020-04-06 MED ORDER — CEFAZOLIN SODIUM-DEXTROSE 2-4 GM/100ML-% IV SOLN
2.0000 g | INTRAVENOUS | Status: AC
  Administered 2020-04-06 (×2): 2 g via INTRAVENOUS

## 2020-04-06 MED ORDER — GABAPENTIN 300 MG PO CAPS: 300.0000 mg | ORAL_CAPSULE | Freq: Once | ORAL | Status: AC

## 2020-04-06 MED ORDER — TRANEXAMIC ACID-NACL 1000-0.7 MG/100ML-% IV SOLN: 1000.0000 mg | Freq: Once | INTRAVENOUS | Status: AC

## 2020-04-06 MED ORDER — OXYCODONE HCL 5 MG PO TABS: 5.0000 mg | ORAL_TABLET | Freq: Once | ORAL | Status: DC | PRN

## 2020-04-06 MED ORDER — ONDANSETRON HCL 4 MG PO TABS: 4.0000 mg | ORAL_TABLET | Freq: Four times a day (QID) | ORAL | Status: DC | PRN

## 2020-04-06 MED ORDER — LIDOCAINE HCL (PF) 2 % IJ SOLN
INTRAMUSCULAR | Status: AC
  Filled 2020-04-06: qty 5

## 2020-04-06 MED ORDER — FERROUS SULFATE 325 (65 FE) MG PO TABS
325.0000 mg | ORAL_TABLET | Freq: Two times a day (BID) | ORAL | Status: DC
  Administered 2020-04-06 – 2020-04-08 (×4): 325 mg via ORAL
  Filled 2020-04-06 (×4): qty 1

## 2020-04-06 MED ORDER — PHENYLEPHRINE HCL (PRESSORS) 10 MG/ML IV SOLN
INTRAVENOUS | Status: DC | PRN
  Administered 2020-04-06: 100 ug via INTRAVENOUS
  Administered 2020-04-06: 50 ug via INTRAVENOUS
  Administered 2020-04-06: 100 ug via INTRAVENOUS

## 2020-04-06 MED ORDER — VITAMIN B-12 1000 MCG PO TABS
1000.0000 ug | ORAL_TABLET | Freq: Every day | ORAL | Status: DC
  Administered 2020-04-07 – 2020-04-08 (×2): 1000 ug via ORAL
  Filled 2020-04-06 (×2): qty 1

## 2020-04-06 MED ORDER — CEFAZOLIN SODIUM-DEXTROSE 2-4 GM/100ML-% IV SOLN
2.0000 g | Freq: Four times a day (QID) | INTRAVENOUS | Status: AC
  Administered 2020-04-06 – 2020-04-07 (×4): 2 g via INTRAVENOUS
  Filled 2020-04-06 (×4): qty 100

## 2020-04-06 MED ORDER — FENTANYL CITRATE (PF) 100 MCG/2ML IJ SOLN
INTRAMUSCULAR | Status: DC | PRN
  Administered 2020-04-06: 150 ug via INTRAVENOUS
  Administered 2020-04-06: 100 ug via INTRAVENOUS

## 2020-04-06 MED ORDER — HYDROMORPHONE HCL 1 MG/ML IJ SOLN: 0.5000 mg | INTRAMUSCULAR | Status: DC | PRN

## 2020-04-06 MED ORDER — HYDROMORPHONE HCL 1 MG/ML IJ SOLN
INTRAMUSCULAR | Status: AC
  Filled 2020-04-06: qty 1

## 2020-04-06 MED ORDER — MENTHOL 3 MG MT LOZG
1.0000 | LOZENGE | OROMUCOSAL | Status: DC | PRN
  Filled 2020-04-06: qty 9

## 2020-04-06 MED ORDER — CYCLOBENZAPRINE HCL 10 MG PO TABS
10.0000 mg | ORAL_TABLET | Freq: Every day | ORAL | Status: DC
  Administered 2020-04-07: 10 mg via ORAL
  Filled 2020-04-06: qty 1

## 2020-04-06 MED ORDER — PROPOFOL 10 MG/ML IV BOLUS
INTRAVENOUS | Status: AC
  Filled 2020-04-06: qty 20

## 2020-04-06 MED ORDER — FAMOTIDINE 20 MG PO TABS: 20.0000 mg | ORAL_TABLET | Freq: Once | ORAL | Status: AC

## 2020-04-06 MED ORDER — GABAPENTIN 300 MG PO CAPS
ORAL_CAPSULE | ORAL | Status: AC
  Administered 2020-04-06: 300 mg via ORAL
  Filled 2020-04-06: qty 1

## 2020-04-06 MED ORDER — CHLORHEXIDINE GLUCONATE 4 % EX LIQD: 60.0000 mL | Freq: Once | CUTANEOUS | Status: DC

## 2020-04-06 MED ORDER — METOCLOPRAMIDE HCL 10 MG PO TABS
10.0000 mg | ORAL_TABLET | Freq: Three times a day (TID) | ORAL | Status: AC
  Administered 2020-04-06 – 2020-04-08 (×8): 10 mg via ORAL
  Filled 2020-04-06 (×8): qty 1

## 2020-04-06 MED ORDER — DEXMEDETOMIDINE HCL IN NACL 400 MCG/100ML IV SOLN
INTRAVENOUS | Status: DC | PRN
  Administered 2020-04-06: 20 ug via INTRAVENOUS

## 2020-04-06 MED ORDER — PROPOFOL 10 MG/ML IV BOLUS
INTRAVENOUS | Status: DC | PRN
  Administered 2020-04-06: 140 mg via INTRAVENOUS

## 2020-04-06 MED ORDER — FENTANYL CITRATE (PF) 250 MCG/5ML IJ SOLN
INTRAMUSCULAR | Status: AC
  Filled 2020-04-06: qty 5

## 2020-04-06 MED ORDER — DEXAMETHASONE SODIUM PHOSPHATE 10 MG/ML IJ SOLN: 8.0000 mg | Freq: Once | INTRAMUSCULAR | Status: AC

## 2020-04-06 MED ORDER — SENNOSIDES-DOCUSATE SODIUM 8.6-50 MG PO TABS
1.0000 | ORAL_TABLET | Freq: Two times a day (BID) | ORAL | Status: DC
  Administered 2020-04-06 – 2020-04-08 (×4): 1 via ORAL
  Filled 2020-04-06 (×4): qty 1

## 2020-04-06 MED ORDER — OXYCODONE HCL 5 MG PO TABS
5.0000 mg | ORAL_TABLET | ORAL | Status: DC | PRN
  Administered 2020-04-07: 5 mg via ORAL
  Filled 2020-04-06: qty 1

## 2020-04-06 MED ORDER — HYDROCHLOROTHIAZIDE 25 MG PO TABS
25.0000 mg | ORAL_TABLET | Freq: Every day | ORAL | Status: DC
  Administered 2020-04-06 – 2020-04-08 (×3): 25 mg via ORAL
  Filled 2020-04-06 (×3): qty 1

## 2020-04-06 MED ORDER — ACETAMINOPHEN 325 MG PO TABS: 325.0000 mg | ORAL_TABLET | Freq: Four times a day (QID) | ORAL | Status: DC | PRN

## 2020-04-06 MED ORDER — FENTANYL CITRATE (PF) 100 MCG/2ML IJ SOLN: 25.0000 ug | INTRAMUSCULAR | Status: DC | PRN

## 2020-04-06 MED ORDER — DEXAMETHASONE SODIUM PHOSPHATE 10 MG/ML IJ SOLN
INTRAMUSCULAR | Status: DC | PRN
  Administered 2020-04-06: 10 mg via INTRAVENOUS

## 2020-04-06 MED ORDER — OXYCODONE HCL 5 MG/5ML PO SOLN: 5.0000 mg | Freq: Once | ORAL | Status: DC | PRN

## 2020-04-06 MED ORDER — PHENYLEPHRINE HCL (PRESSORS) 10 MG/ML IV SOLN
INTRAVENOUS | Status: AC
  Filled 2020-04-06: qty 1

## 2020-04-06 MED ORDER — CELECOXIB 200 MG PO CAPS: 400.0000 mg | ORAL_CAPSULE | Freq: Once | ORAL | Status: AC

## 2020-04-06 MED ORDER — PHENYLEPHRINE HCL-NACL 20-0.9 MG/250ML-% IV SOLN
INTRAVENOUS | Status: DC | PRN
  Administered 2020-04-06: 50 ug/min via INTRAVENOUS

## 2020-04-06 MED ORDER — GABAPENTIN 300 MG PO CAPS
300.0000 mg | ORAL_CAPSULE | Freq: Every day | ORAL | Status: DC
  Administered 2020-04-06 – 2020-04-07 (×2): 300 mg via ORAL
  Filled 2020-04-06 (×2): qty 1

## 2020-04-06 MED ORDER — ONDANSETRON HCL 4 MG/2ML IJ SOLN
INTRAMUSCULAR | Status: DC | PRN
  Administered 2020-04-06: 4 mg via INTRAVENOUS

## 2020-04-06 MED ORDER — DEXAMETHASONE SODIUM PHOSPHATE 10 MG/ML IJ SOLN
INTRAMUSCULAR | Status: AC
  Filled 2020-04-06: qty 1

## 2020-04-06 MED ORDER — METOCLOPRAMIDE HCL 5 MG/ML IJ SOLN: 5.0000 mg | Freq: Three times a day (TID) | INTRAMUSCULAR | Status: DC | PRN

## 2020-04-06 MED ORDER — CEFAZOLIN SODIUM-DEXTROSE 2-4 GM/100ML-% IV SOLN
INTRAVENOUS | Status: AC
  Filled 2020-04-06: qty 100

## 2020-04-06 MED ORDER — CELECOXIB 200 MG PO CAPS
ORAL_CAPSULE | ORAL | Status: AC
  Administered 2020-04-06: 400 mg via ORAL
  Filled 2020-04-06: qty 2

## 2020-04-06 SURGICAL SUPPLY — 72 items
BONE CORTICO CANC 40ML HUMAN T (Bone Implant) ×2 IMPLANT
CANISTER SUCT 1200ML W/VALVE (MISCELLANEOUS) ×2 IMPLANT
CANISTER SUCT 3000ML PPV (MISCELLANEOUS) ×4 IMPLANT
COVER BACK TABLE REUSABLE LG (DRAPES) ×2 IMPLANT
COVER WAND RF STERILE (DRAPES) ×2 IMPLANT
CUP PINNACLE SZ 68MM HIP (Cup) ×1 IMPLANT
DRAPE 3/4 80X56 (DRAPES) ×4 IMPLANT
DRAPE C-ARM XRAY 36X54 (DRAPES) ×1 IMPLANT
DRAPE INCISE IOBAN 66X60 STRL (DRAPES) ×2 IMPLANT
DRAPE XRAY CASSETTE 23X24 (DRAPES) ×1 IMPLANT
DRSG DERMACEA 8X12 NADH (GAUZE/BANDAGES/DRESSINGS) ×2 IMPLANT
DRSG OPSITE POSTOP 4X14 (GAUZE/BANDAGES/DRESSINGS) ×2 IMPLANT
DRSG TEGADERM 4X4.75 (GAUZE/BANDAGES/DRESSINGS) ×1 IMPLANT
DURAPREP 26ML APPLICATOR (WOUND CARE) ×2 IMPLANT
GAUZE SPONGE 4X4 12PLY STRL (GAUZE/BANDAGES/DRESSINGS) ×2 IMPLANT
GLOVE BIO SURGEON STRL SZ7.5 (GLOVE) ×4 IMPLANT
GLOVE BIOGEL M STRL SZ7.5 (GLOVE) ×4 IMPLANT
GLOVE BIOGEL PI IND STRL 7.5 (GLOVE) ×1 IMPLANT
GLOVE BIOGEL PI INDICATOR 7.5 (GLOVE) ×1
GLOVE INDICATOR 8.0 STRL GRN (GLOVE) ×2 IMPLANT
GOWN STRL REUS W/ TWL LRG LVL3 (GOWN DISPOSABLE) ×2 IMPLANT
GOWN STRL REUS W/ TWL XL LVL3 (GOWN DISPOSABLE) ×1 IMPLANT
GOWN STRL REUS W/TWL LRG LVL3 (GOWN DISPOSABLE) ×4
GOWN STRL REUS W/TWL XL LVL3 (GOWN DISPOSABLE) ×2
GRAFT BNE CORT CANC CHIPS 40CC (Bone Implant) IMPLANT
HANDPIECE VERSAJET DEBRIDEMENT (MISCELLANEOUS) IMPLANT
HEAD M SROM 36MM PLUS 1.5 (Hips) IMPLANT
HEMOVAC 400CC 10FR (MISCELLANEOUS) ×2 IMPLANT
HOLDER FOLEY CATH W/STRAP (MISCELLANEOUS) ×2 IMPLANT
HOOD PEEL AWAY FLYTE STAYCOOL (MISCELLANEOUS) ×4 IMPLANT
IRRIGATION STRYKERFLOW (MISCELLANEOUS) ×1 IMPLANT
IRRIGATOR STRYKERFLOW (MISCELLANEOUS) ×2
IV NS 100ML SINGLE PACK (IV SOLUTION) ×2 IMPLANT
LINER PINN MAR 4PLUS 10 62 (Hips) ×1 IMPLANT
MANIFOLD NEPTUNE II (INSTRUMENTS) ×2 IMPLANT
NDL FILTER BLUNT 18X1 1/2 (NEEDLE) ×1 IMPLANT
NDL SAFETY ECLIPSE 18X1.5 (NEEDLE) ×1 IMPLANT
NEEDLE FILTER BLUNT 18X 1/2SAF (NEEDLE) ×1
NEEDLE FILTER BLUNT 18X1 1/2 (NEEDLE) ×1 IMPLANT
NEEDLE HYPO 18GX1.5 SHARP (NEEDLE) ×2
NS IRRIG 1000ML POUR BTL (IV SOLUTION) ×2 IMPLANT
NS IRRIG 500ML POUR BTL (IV SOLUTION) ×1 IMPLANT
PACK HIP PROSTHESIS (MISCELLANEOUS) ×2 IMPLANT
PENCIL SMOKE ULTRAEVAC 22 CON (MISCELLANEOUS) ×2 IMPLANT
PULSAVAC PLUS IRRIG FAN TIP (DISPOSABLE) ×2
PUTTY DBX 5CC (Putty) ×1 IMPLANT
SCREW 6.5MMX25MM (Screw) ×2 IMPLANT
SCREW 6.5MMX30MM (Screw) ×1 IMPLANT
SCREW BN 35X5XPERI TPR HD (Screw) IMPLANT
SCREW BONE 5.0X35 (Screw) ×4 IMPLANT
SCREW PERIPHERAL BONE SZ 5MX45 (Screw) ×1 IMPLANT
SCREW PINN CAN 6.5X20 (Screw) ×1 IMPLANT
SOL .9 NS 3000ML IRR  AL (IV SOLUTION) ×2
SOL .9 NS 3000ML IRR UROMATIC (IV SOLUTION) ×1 IMPLANT
SPONGE DRAIN TRACH 4X4 STRL 2S (GAUZE/BANDAGES/DRESSINGS) ×1 IMPLANT
SROM M HEAD 36MM PLUS 1.5 (Hips) ×2 IMPLANT
STAPLER SKIN PROX 35W (STAPLE) ×2 IMPLANT
STRAP SAFETY 5IN WIDE (MISCELLANEOUS) ×2 IMPLANT
SUCTION FRAZIER HANDLE 10FR (MISCELLANEOUS) ×2
SUCTION TUBE FRAZIER 10FR DISP (MISCELLANEOUS) ×1 IMPLANT
SUT ETHIBOND #5 BRAIDED 30INL (SUTURE) ×2 IMPLANT
SUT VIC AB 0 CT1 36 (SUTURE) ×2 IMPLANT
SUT VIC AB 1 CT1 36 (SUTURE) ×4 IMPLANT
SUT VIC AB 2-0 CT1 27 (SUTURE) ×2
SUT VIC AB 2-0 CT1 TAPERPNT 27 (SUTURE) ×1 IMPLANT
SYR 20ML LL LF (SYRINGE) ×2 IMPLANT
TAPE CLOTH 3X10 WHT NS LF (GAUZE/BANDAGES/DRESSINGS) ×2 IMPLANT
TAPE TRANSPORE STRL 2 31045 (GAUZE/BANDAGES/DRESSINGS) ×2 IMPLANT
TIP BRUSH PULSAVAC PLUS 24.33 (MISCELLANEOUS) IMPLANT
TIP FAN IRRIG PULSAVAC PLUS (DISPOSABLE) ×1 IMPLANT
TOWEL OR 17X26 4PK STRL BLUE (TOWEL DISPOSABLE) ×2 IMPLANT
TRAY FOLEY MTR SLVR 16FR STAT (SET/KITS/TRAYS/PACK) ×2 IMPLANT

## 2020-04-06 NOTE — Evaluation (Signed)
Physical Therapy Evaluation Patient Details Name: Bryan Frey MRN: [id number] DOB: 1946-07-12 Today's Date: 04/06/2020   History of Present Illness  74 y/o male s/p L total hip revision 7/2, posterior approach (had original replacement Nov 2011, acetabular revision Aug 2016).  Clinical Impression  Pt minimally lethargic post surgery (and did have some dizziness and dry heaving on first sitting up, BP WNL) but was eager to work with PT.  He was able to recall 2/3 precautions when asked with minimal clue/cues.  On arrival he did have L LE IR posturing (even with pillows well positioned between LEs) that was not excessive, but present at rest and even with focused effort to externally rotate L LE was minimally able to get much past neutral; this posturing was present in standing as well; family reports essentially his long standing baseline.  He did very well with supine exercises and was able to do all exercises with little to no hesitation and with light resistance, able to confidently do AROM SLRs with surprising confidence and strength.  Pt was able to safely ambulate ~35 ft with walker, but did have decreased quality of motion/L LE mechanics and WBing acceptance with no buckling but clear reliance on the walker for stability through UEs.    Follow Up Recommendations Home health PT;Supervision - Intermittent    Equipment Recommendations  None recommended by PT    Recommendations for Other Services       Precautions / Restrictions Precautions Precautions: Fall;Posterior Hip Precaution Comments: Pt recalled 2/3 precautions with minimal cuing, resting posture does shade toward internal rotation Restrictions Weight Bearing Restrictions: Yes LLE Weight Bearing: Weight bearing as tolerated      Mobility  Bed Mobility Overal bed mobility: Needs Assistance Bed Mobility: Supine to Sit     Supine to sit: Min assist     General bed mobility comments: Pt was able to bridge hips and  scoot in bed, did need light assist with getting trunk up to sitting position EOB  Transfers Overall transfer level: Needs assistance Equipment used: Rolling walker (2 wheeled) Transfers: Sit to/from Stand Sit to Stand: Min guard         General transfer comment: cuing for set up and sequencing, able to rise w/o direct assist but with heavy UE use  Ambulation/Gait Ambulation/Gait assistance: Min guard Gait Distance (Feet): 35 Feet Assistive device: Rolling walker (2 wheeled)       General Gait Details: Pt safe and stable with ambulation, however he had natural standing posture with slight L inversion (family reports essentially his baseline position) with decreased quality of motion during swing through and decreased coordination with placement and WBing acceptance.  No overt buckling but clearly with increased UE use during L WBing.  Stairs            Wheelchair Mobility    Modified Rankin (Stroke Patients Only)       Balance Overall balance assessment: Modified Independent                                           Pertinent Vitals/Pain Pain Assessment: 0-10 Pain Score: 4  Pain Location: L hip    Home Living Family/patient expects to be discharged to:: Private residence Living Arrangements: Spouse/significant other Available Help at Discharge: Family;Available 24 hours/day   Home Access: Stairs to enter Entrance Stairs-Rails: Can reach both Entrance Stairs-Number of  Steps: 2   Home Equipment: [place] - 2 wheels;Bedside commode      Prior Function Level of Independence: Independent with assistive device(s)         Comments: pt has  recently been struggling with ambulation, highly reliant on walker with slow, cautious gait     Hand Dominance        Extremity/Trunk Assessment   Upper Extremity Assessment Upper Extremity Assessment: Overall WFL for tasks assessed    Lower Extremity Assessment Lower Extremity Assessment:  Overall WFL for tasks assessed       Communication   Communication: No difficulties  Cognition Arousal/Alertness: Awake/alert Behavior During Therapy: WFL for tasks assessed/performed Overall Cognitive Status: Within Functional Limits for tasks assessed                                        General Comments      Exercises Total Joint Exercises Ankle Circles/Pumps: AROM;10 reps Quad Sets: Strengthening;10 reps Heel Slides: Strengthening;10 reps (with resisted leg ext) Hip ABduction/ADduction: Strengthening;10 reps (pt with good quality of motion and strength ) Straight Leg Raises: AROM;5 reps   Assessment/Plan    PT Assessment Patient needs continued PT services  PT Problem List Decreased strength;Decreased range of motion;Decreased activity tolerance;Decreased mobility;Decreased knowledge of use of DME;Decreased safety awareness;Decreased coordination;Decreased balance;Decreased knowledge of precautions;Pain       PT Treatment Interventions DME instruction;Gait training;Stair training;Functional mobility training;Therapeutic activities;Therapeutic exercise;Balance training;Neuromuscular re-education;Patient/family education    PT Goals (Current goals can be found in the Care Plan section)  Acute Rehab PT Goals Patient Stated Goal: go home PT Goal Formulation: With patient Time For Goal Achievement: 04/20/20 Potential to Achieve Goals: Good    Frequency BID   Barriers to discharge        Co-evaluation               AM-PAC PT "6 Clicks" Mobility  Outcome Measure Help needed turning from your back to your side while in a flat bed without using bedrails?: A Little Help needed moving from lying on your back to sitting on the side of a flat bed without using bedrails?: A Little Help needed moving to and from a bed to a chair (including a wheelchair)?: A Little Help needed standing up from a chair using your arms (e.g., wheelchair or bedside  chair)?: A Little Help needed to walk in hospital room?: A Little Help needed climbing 3-5 steps with a railing? : A Lot 6 Click Score: 17    End of Session Equipment Utilized During Treatment: Gait belt Activity Tolerance: Patient tolerated treatment well Patient left: with chair alarm set;with call bell/phone within reach Nurse Communication: Need for lift equipment PT Visit Diagnosis: Other abnormalities of gait and mobility (R26.89);Muscle weakness (generalized) (M62.81);Pain Pain - Right/Left: Left Pain - part of body: Hip    Time: 1520-1601 PT Time Calculation (min) (ACUTE ONLY): 41 min   Charges:   PT Evaluation $PT Eval Low Complexity: 1 Low PT Treatments $Gait Training: 8-22 mins $Therapeutic Exercise: 8-22 mins        Kreg Shropshire, DPT 04/06/2020, 6:27 PM

## 2020-04-06 NOTE — Anesthesia Postprocedure Evaluation (Signed)
Anesthesia Post Note  Patient: Bryan Frey  Procedure(s) Performed: TOTAL HIP REVISION (Left Hip)  Patient location during evaluation: PACU Anesthesia Type: General Level of consciousness: awake and alert Pain management: pain level controlled Vital Signs Assessment: post-procedure vital signs reviewed and stable Respiratory status: spontaneous breathing, nonlabored ventilation and respiratory function stable Cardiovascular status: blood pressure returned to baseline and stable Postop Assessment: no apparent nausea or vomiting Anesthetic complications: no   No complications documented.   Last Vitals:  Vitals:   04/06/20 1508 04/06/20 1517  BP:  122/73  Pulse: 76 75  Resp: 16 14  Temp: (!) 36.2 C   SpO2: 95% 96%    Last Pain:  Vitals:   04/06/20 1508  TempSrc:   PainSc: 0-No pain                 Brett Canales Sande Pickert

## 2020-04-06 NOTE — Anesthesia Procedure Notes (Signed)
Procedure Name: Intubation Performed by: Gaynelle Cage, CRNA Pre-anesthesia Checklist: Patient identified, Emergency Drugs available, Suction available and Patient being monitored Patient Re-evaluated:Patient Re-evaluated prior to induction Oxygen Delivery Method: Circle system utilized Preoxygenation: Pre-oxygenation with 100% oxygen Induction Type: IV induction Ventilation: Mask ventilation without difficulty and Oral airway inserted - appropriate to patient size Grade View: Grade I Tube type: Oral Tube size: 7.5 mm Number of attempts: 1 Airway Equipment and Method: Oral airway Placement Confirmation: ETT inserted through vocal cords under direct vision,  positive ETCO2 and breath sounds checked- equal and bilateral Secured at: 21 cm Tube secured with: Tape Dental Injury: Teeth and Oropharynx as per pre-operative assessment

## 2020-04-06 NOTE — Anesthesia Preprocedure Evaluation (Signed)
Anesthesia Evaluation  Patient identified by MRN, date of birth, ID band Patient awake    Reviewed: Allergy & Precautions, H&P , NPO status , Patient's Chart, lab work & pertinent test results  Airway Mallampati: III  TM Distance: >3 FB Neck ROM: full    Dental  (+) Teeth Intact   Pulmonary neg pulmonary ROS, neg COPD,    Pulmonary exam normal        Cardiovascular hypertension, (-) Past MI Normal cardiovascular exam(-) dysrhythmias      Neuro/Psych PSYCHIATRIC DISORDERS Anxiety Depression Spinal stenosis H/o lumbar fusion    GI/Hepatic negative GI ROS, Neg liver ROS,   Endo/Other  negative endocrine ROS  Renal/GU Renal carcinoma     Musculoskeletal   Abdominal   Peds  Hematology negative hematology ROS (+)   Anesthesia Other Findings Past Medical History: No date: 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 removed tumor and all is clear since               then No date: Nephrolithiasis No date: Neuromuscular disorder ([place])     Comment:  facial nerve pain No date: Spinal stenosis of lumbar region     Comment:  with neurogenic claudication 2010: Stroke ([place])     Comment:  heat stroke d/t a hot day and working under a house.no               problems after No date: Trigeminal neuralgia  Past Surgical History: No date: BACK SURGERY 12/24/2017: CATARACT EXTRACTION W/PHACO; Left     Comment:  Procedure: CATARACT EXTRACTION PHACO AND INTRAOCULAR                LENS PLACEMENT (IOC);  Surgeon: Eulogio Bear, MD;                Location: [facility];  Service: Ophthalmology;  Laterality:              Left;  Lot # [id number] H [place]: 00:30.4 AP%: 9.4 CDE: 2.84 01/26/2018: CATARACT EXTRACTION W/PHACO; Right     Comment:  Procedure: CATARACT EXTRACTION PHACO AND INTRAOCULAR               LENS PLACEMENT (IOC) RIGHT;  Surgeon: Eulogio Bear,              MD;  Location: [facility];  Service:               Ophthalmology;  Laterality: Right; No date: COLONOSCOPY No date: EYE SURGERY; Bilateral     Comment:  cataract extra tions 03/18/2017: IR RADIOLOGIST EVAL & MGMT No date: JOINT REPLACEMENT; Bilateral     Comment:  left hip replaced x 2, right hip x 1 08/07/2010: Left total hip arthroplasty 02/09/2012: LUMBAR LAMINECTOMY/DECOMPRESSION MICRODISCECTOMY     Comment:  Procedure: LUMBAR LAMINECTOMY/DECOMPRESSION               MICRODISCECTOMY 2 LEVELS;  Surgeon: Kristeen Miss, MD;  Location: [facility];  Service: Neurosurgery;                Laterality: Bilateral;  Bilateral Lumbar two-three,lumbar              four-five laminectomies 02/09/2014: NEPHROLITHOTOMY; Right     Comment:  Procedure: NEPHROLITHOTOMY PERCUTANEOUS;  Surgeon:               Franchot Gallo, MD;  Location: [facility];  Service:               Urology;  Laterality: Right; 07/13/2014: Right total hip arthroplasty 05/14/2015: TOTAL HIP REVISION; Left     Comment:  Procedure: TOTAL HIP REVISION;  Surgeon: Dereck Leep,              MD;  Location: [facility];  Service: Orthopedics;                Laterality: Left;  BMI    Body Mass Index: 32.86 kg/m      Reproductive/Obstetrics negative OB ROS                             Anesthesia Physical Anesthesia Plan  ASA: II  Anesthesia Plan: General ETT   Post-op Pain Management:    Induction:   PONV Risk Score and Plan: Ondansetron, Dexamethasone and Treatment may vary due to age or medical  condition  Airway Management Planned:   Additional Equipment:   Intra-op Plan:   Post-operative Plan:   Informed Consent: I have reviewed the patients History and Physical, chart, labs and discussed the procedure including the risks, benefits and alternatives for the proposed anesthesia with the patient or authorized representative who has indicated his/her understanding and acceptance.     Dental Advisory Given  Plan Discussed with: Anesthesiologist, CRNA and Surgeon  Anesthesia Plan Comments:         Anesthesia Quick Evaluation

## 2020-04-06 NOTE — Transfer of Care (Signed)
Immediate Anesthesia Transfer of Care Note  Patient: Bryan Frey  Procedure(s) Performed: TOTAL HIP REVISION (Left Hip)  Patient Location: PACU  Anesthesia Type:General  Level of Consciousness: sedated  Airway & Oxygen Therapy: Patient Spontanous Breathing and Patient connected to face mask oxygen  Post-op Assessment: Report given to RN and Post -op Vital signs reviewed and stable  Post vital signs: Reviewed and stable  Last Vitals:  Vitals Value Taken Time  BP    Temp    Pulse    Resp    SpO2      Last Pain:  Vitals:   04/06/20 0641  TempSrc: Tympanic  PainSc: 5       Patients Stated Pain Goal: 2 (59/74/71 8550)  Complications: No complications documented.

## 2020-04-06 NOTE — Op Note (Signed)
OPERATIVE NOTE  DATE OF SURGERY:  04/06/2020  PATIENT NAME:  Bryan Frey   DOB: 02/01/1946  MRN: [id number]  PRE-OPERATIVE DIAGNOSIS: Prosthetic loosening, status post left total hip arthroplasty  POST-OPERATIVE DIAGNOSIS:  Same (acetabular component)  PROCEDURE:  Left hip revision arthroplasty (acetabular component)  SURGEON:  Marciano Sequin. M.D.  ASSISTANT: Cassell Smiles, PA-C (present and scrubbed throughout the case, critical for assistance with exposure, retraction, instrumentation, and closure)  ANESTHESIA: general  ESTIMATED BLOOD LOSS: 600 mL  FLUIDS REPLACED: 2250 mL of crystalloid, 250 mL of colloid  DRAINS: 2 medium drains to a Hemovac reservoir  IMPLANTS UTILIZED: DePuy 68 mm OD Pinnacle Gription Revision acetabular component, +4 mm 10 degree Pinnacle Marathon polyethylene insert, 36 mm M-SPEC +1.5 mm hip ball, 4 - 6.5 mm cancellous bone screws, and 3 - 5.0 mm peripheral bone screws  INDICATIONS FOR SURGERY: Bryan Frey is a 74 y.o. year old male who previously underwent left total hip arthroplasty and subsequent revision of the left acetabular component.  He presented with increased pain to the left hip and groin with progressive limb length discrepancy.  X-rays demonstrated findings consistent with loosening of the acetabular component.  After discussion of the risks and benefits of surgical intervention, the patient expressed understanding of the risks benefits and agree with plans for revision of the left total hip arthroplasty.   The risks, benefits, and alternatives were discussed at length including but not limited to the risks of infection, bleeding, nerve injury, stiffness, blood clots, the need for revision surgery, limb length inequality, dislocation, cardiopulmonary complications, among others, and they were willing to proceed.  PROCEDURE IN DETAIL: The patient was brought into the operating room and, after adequate general anesthesia was achieved,  the patient was placed in a right lateral decubitus position. Axillary roll was placed and all bony prominences were well-padded. The patient's left hip was cleaned and prepped with alcohol and DuraPrep and draped in the usual sterile fashion. A "timeout" was performed as per usual protocol. A lateral curvilinear incision was made in line with the previous surgical scar gently curving towards the posterior superior iliac spine. The IT band was incised in line with the skin incision and the fibers of the gluteus maximus were split in line.  Significant fibrotic tissue was encountered and a firm pseudocapsule was noted.  Dissection was carried along the margin of the proximal femur posteriorly and a capsulotomy was performed.  Swabs were submitted for stat Gram stain, culture and sensitivity.  Dissection was continued with debridement of the fibrotic tissue so as to fully visualize the rim of the acetabular component as well as the proximal portion of the femoral component.  The acetabular component had obviously migrated superiorly and was noted to be grossly loose.  There was noted to be some osteolysis around the calcar region of the femur, although the femoral stem was stable. The femoral head was dislocated posteriorly and the head was disengaged from the trunnion without difficulty.  The trunnion was inspected and noted to be free of any corrosive changes.  Dissection was continued around the periphery of the acetabular component using rongeurs and curettes.  The polyethylene was disengaged from the shell using a 6.5 mm cancellous screw.  3 6.5 mm cortical screws were removed from the dome holes without difficulty.  There was no significant bony purchase with the screws.  The shell was then removed without difficulty.  No gross bony ingrowth was encountered, only fibrous  ingrowth.  The true acetabulum and the pseudoacetabulum were debrided using curettes.  The periphery was completely visualized after  debridement with rongeurs.  The acetabulum was reamed in a sequential fashion up to a 67 mm diameter and attempt to fit a "jumbo" cup in the defect.  The 67 mm trial was inserted with good peripheral fit.  There were noted to be several contained lesions somewhat superiorly.  Intraoperative x-ray was obtained which showed good position of the trial component.  Trial component was then removed.  A combination of 40 cc of cortical cancellous bone graft was mixed with 5 cc of DBX putty.  The graft material was impacted in the areas of defect and then the 67 mm reamer was inserted and placed in reverse so as to impact the graft material.  A 68 mm outer diameter Pinnacle Gription Revision acetabular component was then positioned and impacted into place.  Good scratch fit was appreciated.  4 6.5 mm dome screws were inserted with good purchase noted.  Finally, three 5.0 mm peripheral screws were inserted also with good purchase.  A +4 mm neutral polyethylene trial was placed and trial reduction was performed using a 36 mm head with first a -2 mm and subsequently a +1.5 mm neck..  Reasonably good stability was noted.  However, was elected to trial with a +4 mm 10 degree liner with the high side at the 4 o'clock position.  This provided improved stability posteriorly and good equalization of limb length.  The trial implants were removed.  A +4 mm 10 degree Pinnacle Marathon polyethylene insert was positioned with high side at the 4 o'clock position and impacted into place.  The trunnion was cleaned and dried and a 36 mm M-SPEC hip ball with a +1.5 mm neck length was positioned and impacted in place.  Hip was reduced and placed range of motion.  Good stability was appreciated and good equalization of limb lengths noted.  The wound was irrigated with copious amounts of normal saline with antibiotic solution and suctioned dry.  Wound was then soaked for a minimum of 5 minutes in a dilute Betadine solution (17.5 mL of Betadine  and 500 mL of normal saline).  Good hemostasis was appreciated. The posterior capsulotomy was repaired using #5 Ethibond. Two medium drains were placed in the wound bed and brought out through separate stab incisions to be attached to a Hemovac reservoir. The IT band was reapproximated using interrupted sutures of #1 Vicryl. Subcutaneous tissue was approximated using first #0 Vicryl followed by #2-0 Vicryl. The skin was closed with skin staples.  The patient tolerated the procedure well and was transported to the recovery room in stable condition.   Marciano Sequin., M.D.

## 2020-04-06 NOTE — H&P (Signed)
The patient has been re-examined, and the chart reviewed, and there have been no interval changes to the documented history and physical.    I explained to the patient that the preop C-reactive protein was elevated (3.4) and the sedimentation rate was elevated (58).  Intraoperative swabs will be obtained for stat Gram stain and culture and sensitivity.  I explained that in the event of a periprosthetic infection, the need for removal of the implants and placement of an antibiotic spacer would be a possibility.  The risks, benefits, and alternatives have been discussed at length. The patient expressed understanding of the risks benefits and agreed with plans for surgical intervention.  Bryan Frey M.D.

## 2020-04-07 LAB — CBC
HCT: 27.7 % — ABNORMAL LOW (ref 39.0–52.0)
Hemoglobin: 9.1 g/dL — ABNORMAL LOW (ref 13.0–17.0)
MCH: 30.8 pg (ref 26.0–34.0)
MCHC: 32.9 g/dL (ref 30.0–36.0)
MCV: 93.9 fL (ref 80.0–100.0)
Platelets: 303 10*3/uL (ref 150–400)
RBC: 2.95 MIL/uL — ABNORMAL LOW (ref 4.22–5.81)
RDW: 12.5 % (ref 11.5–15.5)
WBC: 10.9 10*3/uL — ABNORMAL HIGH (ref 4.0–10.5)
nRBC: 0 % (ref 0.0–0.2)

## 2020-04-07 LAB — GRAM STAIN

## 2020-04-07 MED ORDER — ADULT MULTIVITAMIN W/MINERALS CH
1.0000 | ORAL_TABLET | Freq: Every day | ORAL | Status: DC
  Administered 2020-04-07 – 2020-04-08 (×2): 1 via ORAL
  Filled 2020-04-07 (×2): qty 1

## 2020-04-07 MED ORDER — PRO-STAT SUGAR FREE PO LIQD
30.0000 mL | Freq: Two times a day (BID) | ORAL | Status: DC
  Administered 2020-04-07 – 2020-04-08 (×2): 30 mL via ORAL

## 2020-04-07 NOTE — Progress Notes (Signed)
Physical Therapy Treatment Patient Details Name: Bryan Frey MRN: [id number] DOB: 1946-04-09 Today's Date: 04/07/2020    History of Present Illness Pt. 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    PT Comments    Pt alert, oriented, agreeable to PT. Reported pain as a 3-5/10 in L hip. Pt demonstrated bed mobility modI, and sit <> stand transfers with modI, safe hand placement noted. The patient was able to ambulate ~22ft with RW and CGA, and then after stair training additional 143ft.  Cued for upright gait, RW positioning, to avoid excessive L toeing in during gait. Able to correct to an extent. Also cued for pacing, pt appears to be slightly unsteady when hurried. Stair training performed with bilateral rails and step to gait pattern, pt verbalized understanding, taught back technique and CGA to perform. Up in recliner, all needs in reach at end of session. The patient would benefit from further skilled PT intervention to continue to progress towards goals. Recommendation remains appropriate.      Follow Up Recommendations  Home health PT;Supervision - Intermittent     Equipment Recommendations  None recommended by PT (Pt advised to use RW not rollator at discharge)    Recommendations for Other Services       Precautions / Restrictions Precautions Precautions: Fall;Posterior Hip Precaution Booklet Issued: No Precaution Comments: Pt still only able to recall 2/3 precautions, unable to recall avoiding L hip IR Restrictions Weight Bearing Restrictions: Yes LLE Weight Bearing: Weight bearing as tolerated    Mobility  Bed Mobility Overal bed mobility: Modified Independent             General bed mobility comments: Pt. up in recliner upon arrival  Transfers Overall transfer level: Needs assistance Equipment used: Rolling walker (2 wheeled) Transfers: Sit to/from Stand Sit to  Stand: Min guard         General transfer comment: Mobility per PT report  Ambulation/Gait Ambulation/Gait assistance: Min guard Gait Distance (Feet): 150 Feet Assistive device: Rolling walker (2 wheeled)       General Gait Details: Cued for upright gait, RW positioning, to avoid excessive L toeing in during gait. Able to correct to an extent. Also cued for pacing, pt appears to be slightly unsteady when hurried.   Stairs Stairs: Yes Stairs assistance: Min guard Stair Management: Two rails;Step to pattern Number of Stairs: 4 General stair comments: cued for step to pattern, occasionaly reminder for proper sequencing, pt able to teach back proper technique. CGA with both rails   Wheelchair Mobility    Modified Rankin (Stroke Patients Only)       Balance Overall balance assessment: Needs assistance Sitting-balance support: No upper extremity supported Sitting balance-Leahy Scale: Good     Standing balance support: No upper extremity supported Standing balance-Leahy Scale: Fair                              Cognition Arousal/Alertness: Awake/alert Behavior During Therapy: WFL for tasks assessed/performed Overall Cognitive Status: Within Functional Limits for tasks assessed                                        Exercises      General Comments        Pertinent Vitals/Pain Pain  Assessment: 0-10 Pain Score: 5  Pain Location: Left Hip Pain Descriptors / Indicators: Aching;Grimacing Pain Intervention(s): Limited activity within patient's tolerance    Home Living Family/patient expects to be discharged to:: Private residence   Available Help at Discharge: Family;Available 24 hours/day Type of Home: House Home Access: Stairs to enter Entrance Stairs-Rails: Can reach both Home Layout: One level Home Equipment: Walker - 2 wheels;Bedside commode      Prior Function Level of Independence: Independent with assistive device(s)       Comments: Independent with ADLs, and IADLs. Stopped driving one year ago.   PT Goals (current goals can now be found in the care plan section) Acute Rehab PT Goals Patient Stated Goal: To return home Progress towards PT goals: Progressing toward goals    Frequency    BID      PT Plan Current plan remains appropriate    Co-evaluation              AM-PAC PT "6 Clicks" Mobility   Outcome Measure  Help needed turning from your back to your side while in a flat bed without using bedrails?: None Help needed moving from lying on your back to sitting on the side of a flat bed without using bedrails?: None Help needed moving to and from a bed to a chair (including a wheelchair)?: None Help needed standing up from a chair using your arms (e.g., wheelchair or bedside chair)?: None Help needed to walk in hospital room?: A Little Help needed climbing 3-5 steps with a railing? : A Little 6 Click Score: 22    End of Session Equipment Utilized During Treatment: Gait belt Activity Tolerance: Patient tolerated treatment well Patient left: with chair alarm set;with call bell/phone within reach;with family/visitor present Nurse Communication: Patient requests pain meds PT Visit Diagnosis: Other abnormalities of gait and mobility (R26.89);Muscle weakness (generalized) (M62.81);Pain Pain - Right/Left: Left Pain - part of body: Hip     Time: 2637-8588 PT Time Calculation (min) (ACUTE ONLY): 23 min  Charges:  $Gait Training: 23-37 mins                     Lieutenant Diego PT, DPT 3:25 PM,04/07/20

## 2020-04-07 NOTE — Progress Notes (Signed)
Initial Nutrition Assessment  RD working remotely.  DOCUMENTATION CODES:   Not applicable  INTERVENTION:  - will order 30 mL Prostat BID, each supplement provides 100 kcal and 15 grams of protein. - will order 1 tablet multivitamin with minerals/day.  NUTRITION DIAGNOSIS:   Increased nutrient needs related to post-op healing as evidenced by estimated needs.  GOAL:   Patient will meet greater than or equal to 90% of their needs  MONITOR:   PO intake, Supplement acceptance, Labs, Weight trends, Skin  REASON FOR ASSESSMENT:   Malnutrition Screening Tool  ASSESSMENT:   74 year-old male with medical history of nephrolithiasis, HLD, arthritis, HTN, anxiety, gall stones, depression, gait abnormality, DDD, stroke, anemia, and L renal cancer (2013). He presented to the hospital d/t prosthetic loosening following previous L total hip arthroplasty with plan for revision of the same.  Patient consumed 75% of dinner last night; 80% of breakfast and 100% of lunch today. He has not been weighed since 6/23 at which time he weighed 228 lb. On 6/29 at [facility] he weighed 224 lb. Prior to those weights, the most recently recorded weight in the chart was on 01/26/18 when he weighed 238 lb.    Labs reviewed; Alk Phos elevated. Medications reviewed; 325 mg ferrous sulfate BID, 25 mg hydrodiuril/day, 30 ml milk of magnesia/day, 10 mg oral reglan TID, 40 mg oral protonix BID, 1 tablet senokot BID, 1000 mcg oral cyanocobalamin/day.  IVF; NS @ 100 ml/hr.     NUTRITION - FOCUSED PHYSICAL EXAM:  unable to complete at this time.   Diet Order:   Diet Order            Diet regular Room service appropriate? Yes; Fluid consistency: Thin  Diet effective now                 EDUCATION NEEDS:   No education needs have been identified at this time  Skin:  Skin Assessment: Skin Integrity Issues: Skin Integrity Issues:: Incisions Incisions: L hip (7/2)  Last BM:  7/1  Height:   Ht Readings from  Last 1 Encounters:  03/28/20 _0  (1.778 m)    Weight:   Wt Readings from Last 1 Encounters:  03/28/20 103.9 kg     Estimated Nutritional Needs:  Kcal:  1875-2100 kcal Protein:  95-105 grams Fluid:  >/= 2 L/day     Jarome Matin, MS, RD, LDN, CNSC Inpatient Clinical Dietitian RD pager # available in [facility]  After hours/weekend pager # available in [facility]

## 2020-04-07 NOTE — Evaluation (Signed)
Occupational Therapy Evaluation Patient Details Name: Bryan Frey MRN: [id number] DOB: Jan 20, 1946 Today's Date: 04/07/2020    History of Present Illness Pt. 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   Clinical Impression    Pt. presents with weakness, limited activity tolerance, posterior hip precautions, and limited functional mobility which limits the ability to complete basic ADL and IADL functioning. Pt. Resides at home with his wife. Pt. Was independent with ADLs, and IADL functioning: including meal preparation, and medication management. Pt. Reports that his home has been renovated for improved accessibility. Pt. stopped driving one year ago. Pt. education was provided about A/E for LE ADLs, and posterior hip precautions. Pt. Reports that he has A/E at home that he uses regularly following multiple hip, and back surgeries. Pt. Needs additional training about posterior hip precautions during ADLs. Pt. Could benefit from OT services for ADL training, A/E training, and pt. Education about posterior hip precautions, home modification, and DME. Pt. Plans to return home upon discharge with family to assist pt. as needed. No follow-up OT services. .    Follow Up Recommendations  No follow-up OT   Equipment Recommendations    None by OT   Recommendations for Other Services       Precautions / Restrictions Precautions Precautions: Fall;Posterior Hip Restrictions Weight Bearing Restrictions: Yes LLE Weight Bearing: Weight bearing as tolerated      Mobility Bed Mobility               General bed mobility comments: Pt. up in recliner upon arrival  Transfers Overall transfer level: Needs assistance     Sit to Stand: Min guard         General transfer comment: Mobility per PT report    Balance                                           ADL either performed  or assessed with clinical judgement   ADL Overall ADL's : Needs assistance/impaired Eating/Feeding: Independent;Set up   Grooming: Set up;Independent;Sitting   Upper Body Bathing: Set up;Independent   Lower Body Bathing: Set up;Moderate assistance   Upper Body Dressing : Set up;Independent   Lower Body Dressing: Set up;Moderate assistance   Toilet Transfer: Set up                   Vision Baseline Vision/History: Cataracts (Cataracts removed 2-3 years ago.) Patient Visual Report: No change from baseline       Perception     Praxis      Pertinent Vitals/Pain Pain Assessment: 0-10 Pain Score: 3  Pain Location: Left Hip Pain Descriptors / Indicators: Aching Pain Intervention(s): Limited activity within patient's tolerance     Hand Dominance Right   Extremity/Trunk Assessment Upper Extremity Assessment Upper Extremity Assessment: Overall WFL for tasks assessed           Communication Communication Communication: No difficulties   Cognition Arousal/Alertness: Awake/alert Behavior During Therapy: WFL for tasks assessed/performed Overall Cognitive Status: Within Functional Limits for tasks assessed                                     General Comments       Exercises  Shoulder Instructions      Home Living Family/patient expects to be discharged to:: Private residence   Available Help at Discharge: Family;Available 24 hours/day Type of Home: House Home Access: Stairs to enter [employer] of Steps: 2 Entrance Stairs-Rails: Can reach both Home Layout: One level     Bathroom Shower/Tub: Occupational psychologist: Standard     Home Equipment: Environmental consultant - 2 wheels;Bedside commode          Prior Functioning/Environment Level of Independence: Independent with assistive device(s)        Comments: Independent with ADLs, and IADLs. Stopped driving one year ago.        OT Problem List: Decreased  strength;Decreased activity tolerance;Decreased knowledge of precautions      OT Treatment/Interventions: Self-care/ADL training;Therapeutic activities;Patient/family education;Therapeutic exercise    OT Goals(Current goals can be found in the care plan section) Acute Rehab OT Goals Patient Stated Goal: To return home OT Goal Formulation: With patient Potential to Achieve Goals: Good  OT Frequency: Min 2X/week   Barriers to D/C:            Co-evaluation              AM-PAC OT "6 Clicks" Daily Activity     Outcome Measure Help from another person eating meals?: None Help from another person taking care of personal grooming?: A Little Help from another person toileting, which includes using toliet, bedpan, or urinal?: A Little Help from another person bathing (including washing, rinsing, drying)?: A Lot Help from another person to put on and taking off regular upper body clothing?: None Help from another person to put on and taking off regular lower body clothing?: A Lot 6 Click Score: 18   End of Session Equipment Utilized During Treatment: Gait belt  Activity Tolerance: Patient tolerated treatment well Patient left: in chair;with call bell/phone within reach;with chair alarm set  OT Visit Diagnosis: Muscle weakness (generalized) (M62.81)                Time: 9038-3338 OT Time Calculation (min): 22 min Charges:  OT General Charges $OT Visit: 1 Visit OT Evaluation $OT Eval Low Complexity: 1 Low  Harrel Carina, MS, OTR/L  Harrel Carina 04/07/2020, 3:01 PM

## 2020-04-07 NOTE — Progress Notes (Signed)
  Subjective: 1 Day Post-Op Procedure(s) (LRB): TOTAL HIP REVISION (Left) Patient reports pain as well-controlled.   Patient is well, and has had no acute complaints or problems Plan is to go Home after hospital stay, but this will depend on satisfactory progress with therapy. Negative for chest pain and shortness of breath Fever: no Gastrointestinal: positive for nausea last PM, none this AM and no vomiting  Patient has not had a bowel movement.  Objective: Vital signs in last 24 hours: Temp:  [97.2 F (36.2 C)-97.8 F (36.6 C)] 97.8 F (36.6 C) (07/03 0745) Pulse Rate:  [73-87] 75 (07/03 0745) Resp:  [0-19] 19 (07/03 0745) BP: (91-138)/(52-82) 110/54 (07/03 0745) SpO2:  [93 %-100 %] 94 % (07/03 0745)  Intake/Output from previous day:  Intake/Output Summary (Last 24 hours) at 04/07/2020 1200 Last data filed at 04/07/2020 0920 Gross per 24 hour  Intake 4466.96 ml  Output 1195 ml  Net 3271.96 ml    Intake/Output this shift: Total I/O In: 60 [P.O.:60] Out: 425 [Urine:425]  Labs: Recent Labs    04/07/20 0350  HGB 9.1*   Recent Labs    04/07/20 0350  WBC 10.9*  RBC 2.95*  HCT 27.7*  PLT 303   No results for input(s): NA, K, CL, CO2, BUN, CREATININE, GLUCOSE, CALCIUM in the last 72 hours. No results for input(s): LABPT, INR in the last 72 hours.   EXAM General - Patient is Alert, Appropriate and Oriented Extremity - Neurovascular intact Dorsiflexion/Plantar flexion intact Compartment soft Dressing/Incision -clean, dry, no drainage, Hemovac in place.  Motor Function - intact, moving foot and toes well on exam.  Cardiovascular- Regular rate and rhythm, no murmurs/rubs/gallops Respiratory- Lungs clear to auscultation bilaterally Gastrointestinal- hypoactive bowel sounds, nontender to palpation   Assessment/Plan: 1 Day Post-Op Procedure(s) (LRB): TOTAL HIP REVISION (Left) Active Problems:   S/P revision of total hip  Estimated body mass index is 32.86 kg/m  as calculated from the following:   Height as of this encounter: 5\' 10"  (1.778 m).   Weight as of this encounter: 103.9 kg. Advance diet Up with therapy    DVT Prophylaxis - Lovenox, Ted hose and foot pumps Weight-Bearing as tolerated to left leg  Cassell Smiles, PA-C [facility] 04/07/2020, 12:00 PM

## 2020-04-07 NOTE — Progress Notes (Signed)
Physical Therapy Treatment Patient Details Name: Bryan Frey MRN: [id number] DOB: November 08, 1945 Today's Date: 04/07/2020    History of Present Illness 74 y/o male s/p L total hip revision 7/2, posterior approach (had original replacement Nov 2011, acetabular revision Aug 2016).    PT Comments    Able to move from supine to sitting and slide legs off bed with verbal cues only.  Education about avoiding excessive L hip flexion during transfer but otherwise safe hand placement as well as good sequencing and stability noted. Pt is able to ambulate partially around RN station and back to bed. Cues provided for upright posture and to stay within the walker during gait. Pt demonstrates heavy UE support on rolling walker with excessive L toe in during gait. However pt and wife confirm that this is his baseline and his gait is currently better than prior to surgery. Intermittent L knee buckling noted but pt with adequate safety using UE support on walker. Pt is able to complete all bed exercises as instructed with therapist with good LLE strength demonstrated during SAQ, hip abduction, and SLR. Pt will benefit from PT services to address deficits in strength and mobility in order to return to full function at home.     Follow Up Recommendations  Home health PT;Supervision - Intermittent     Equipment Recommendations  None recommended by PT (Pt advised to use rolling walker and not rollator at dc)    Recommendations for Other Services       Precautions / Restrictions Precautions Precautions: Fall;Posterior Hip Precaution Comments: Pt still only able to recall 2/3 precautions, unable to recall avoiding L hip adduction Restrictions Weight Bearing Restrictions: Yes LLE Weight Bearing: Weight bearing as tolerated    Mobility  Bed Mobility Overal bed mobility: Modified Independent Bed Mobility: Supine to Sit           General bed mobility comments: Able to move from supine to sitting and  slide legs off bed with verbal cues  Transfers Overall transfer level: Needs assistance Equipment used: Rolling walker (2 wheeled) Transfers: Sit to/from Stand Sit to Stand: Min guard         General transfer comment: Education about avoiding excessive L hip flexion during transfer but otherwise safe hand placement as well as good sequencing and stability noted  Ambulation/Gait Ambulation/Gait assistance: Min guard Gait Distance (Feet): 150 Feet Assistive device: Rolling walker (2 wheeled)       General Gait Details: Pt is able to ambulate partially around RN station and back to bed. Cues provided for upright posture and to stay within the walker during gait. Pt demonstrates heavy UE support on rolling walker with excessive L toe in during gait. However pt and wife confirm that this is his baseline and his gait is currently better than prior to surgery. Intermittent L knee buckling noted but pt with adequate safety using UE support on walker   Stairs             Wheelchair Mobility    Modified Rankin (Stroke Patients Only)       Balance Overall balance assessment: Needs assistance Sitting-balance support: No upper extremity supported Sitting balance-Leahy Scale: Good     Standing balance support: No upper extremity supported Standing balance-Leahy Scale: Fair                              Cognition Arousal/Alertness: Awake/alert Behavior During Therapy: WFL for tasks  assessed/performed Overall Cognitive Status: Within Functional Limits for tasks assessed                                        Exercises Total Joint Exercises Ankle Circles/Pumps: AROM;10 reps Quad Sets: Strengthening;10 reps Gluteal Sets: Strengthening;10 reps Towel Squeeze: Strengthening;10 reps Short Arc Quad: Strengthening;Left;10 reps Heel Slides: Strengthening;Left;10 reps Hip ABduction/ADduction: Strengthening;Left;10 reps Straight Leg Raises:  Strengthening;Left;10 reps    General Comments        Pertinent Vitals/Pain Pain Assessment: 0-10 Pain Score: 3  Pain Location: L hip Pain Descriptors / Indicators: Operative site guarding    Home Living                      Prior Function            PT Goals (current goals can now be found in the care plan section) Acute Rehab PT Goals Patient Stated Goal: go home PT Goal Formulation: With patient Time For Goal Achievement: 04/20/20 Potential to Achieve Goals: Good Progress towards PT goals: Progressing toward goals    Frequency    BID      PT Plan Current plan remains appropriate    Co-evaluation              AM-PAC PT "6 Clicks" Mobility   Outcome Measure  Help needed turning from your back to your side while in a flat bed without using bedrails?: A Little Help needed moving from lying on your back to sitting on the side of a flat bed without using bedrails?: A Little Help needed moving to and from a bed to a chair (including a wheelchair)?: A Little Help needed standing up from a chair using your arms (e.g., wheelchair or bedside chair)?: A Little Help needed to walk in hospital room?: A Little Help needed climbing 3-5 steps with a railing? : A Lot 6 Click Score: 17    End of Session Equipment Utilized During Treatment: Gait belt Activity Tolerance: Patient tolerated treatment well Patient left: with chair alarm set;with call bell/phone within reach;with family/visitor present Nurse Communication: Patient requests pain meds PT Visit Diagnosis: Other abnormalities of gait and mobility (R26.89);Muscle weakness (generalized) (M62.81);Pain Pain - Right/Left: Left Pain - part of body: Hip     Time: 9509-3267 PT Time Calculation (min) (ACUTE ONLY): 24 min  Charges:  $Gait Training: 8-22 mins $Therapeutic Exercise: 8-22 mins                     Oretta Berkland D Jamise Pentland PT, DPT, GCS    Vee Bahe 04/07/2020, 10:45 AM

## 2020-04-08 LAB — CBC
HCT: 25.6 % — ABNORMAL LOW (ref 39.0–52.0)
Hemoglobin: 8.9 g/dL — ABNORMAL LOW (ref 13.0–17.0)
MCH: 31.4 pg (ref 26.0–34.0)
MCHC: 34.8 g/dL (ref 30.0–36.0)
MCV: 90.5 fL (ref 80.0–100.0)
Platelets: 268 10*3/uL (ref 150–400)
RBC: 2.83 MIL/uL — ABNORMAL LOW (ref 4.22–5.81)
RDW: 12.6 % (ref 11.5–15.5)
WBC: 7.2 10*3/uL (ref 4.0–10.5)
nRBC: 0 % (ref 0.0–0.2)

## 2020-04-08 MED ORDER — CELECOXIB 200 MG PO CAPS: 200.0000 mg | ORAL_CAPSULE | Freq: Two times a day (BID) | ORAL | 0 refills | Status: DC

## 2020-04-08 MED ORDER — TRAMADOL HCL 50 MG PO TABS: 50.0000 mg | ORAL_TABLET | ORAL | 0 refills | Status: DC | PRN

## 2020-04-08 MED ORDER — OXYCODONE HCL 5 MG PO TABS: 5.0000 mg | ORAL_TABLET | ORAL | 0 refills | Status: DC | PRN

## 2020-04-08 MED ORDER — TAMSULOSIN HCL 0.4 MG PO CAPS: 0.4000 mg | ORAL_CAPSULE | Freq: Every day | ORAL | 1 refills | Status: DC

## 2020-04-08 MED ORDER — CYCLOBENZAPRINE HCL 10 MG PO TABS: 10.0000 mg | ORAL_TABLET | Freq: Every day | ORAL | Status: DC | PRN

## 2020-04-08 MED ORDER — ENOXAPARIN SODIUM 40 MG/0.4ML ~~LOC~~ SOLN
40.0000 mg | SUBCUTANEOUS | 0 refills | Status: DC
Start: 2020-04-08 — End: 2022-06-07

## 2020-04-08 MED ORDER — TAMSULOSIN HCL 0.4 MG PO CAPS
0.4000 mg | ORAL_CAPSULE | Freq: Every day | ORAL | Status: DC
  Administered 2020-04-08: 0.4 mg via ORAL
  Filled 2020-04-08: qty 1

## 2020-04-08 NOTE — TOC Transition Note (Signed)
Transition of Care [facility]) - CM/SW Discharge Note   Patient Details  Name: Bryan Frey MRN: [id number] Date of Birth: [birth year]  [facility] CM/SW Contact:  Boris Sharper, LCSW Phone Number: 04/08/2020, 1:15 PM   Clinical Narrative:    Pt medially stable for discharge per MD. CSW contacted pt to notify of PT recommendations. Pt was agreeable to [name] and stated that he had worked with a [name] agency before but couldn't remember the name. CSW contacted Drue Novel with [name] since the pt is a [name] pt and [name] is  following.    Final next level of care: [name] Barriers to Discharge: No Barriers Identified   Patient Goals and CMS Choice        Discharge Placement                Patient to be transferred to facility by: Spouse Name of family member notified: Helene Kelp Patient and family notified of of transfer: 04/08/20  Discharge Plan and Services                          HH Arranged: PT [facility] Agency: [name] [employer] Date [place]: 04/08/20 Time [place]: [place] Representative spoke with at [place]: [place] ([place]) Interventions     Readmission Risk Interventions No flowsheet data found.

## 2020-04-08 NOTE — Progress Notes (Signed)
Physical Therapy Treatment Patient Details Name: Bryan Frey MRN: [id number] DOB: 09/12/1946 Today's Date: 04/08/2020    History of Present Illness Pt. 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    PT Comments    Patient is progressing well. He was able to progress with gait training this session with increased distance. He does continue to exhibit forward flexed posture with toe in pattern requiring cues for proper gait mechanics and positioning. Patient also requires cues for left posterior hip precautions. Educated patient and family for proper positioning in bed/chair and with gait for maintaining precautions. Patient also instructed in LE strengthening exercise. He does require min VCs for proper positioning and exercise technique for optimal muscle activation. He denies any increase in pain with activities. He would benefit from additional skilled PT intervention to improve strength, ROM and mobility;     Follow Up Recommendations  Home health PT;Supervision - Intermittent     Equipment Recommendations  None recommended by PT    Recommendations for Other Services       Precautions / Restrictions Precautions Precautions: Fall;Posterior Hip Precaution Booklet Issued: No Precaution Comments: Pt still only able to recall 2/3 precautions, unable to recall avoiding L hip flexion Restrictions LLE Weight Bearing: Weight bearing as tolerated    Mobility  Bed Mobility Overal bed mobility: Modified Independent Bed Mobility: Supine to Sit              Transfers   Equipment used: Rolling walker (2 wheeled) Transfers: Sit to/from Stand Sit to Stand: Min guard         General transfer comment: with cues for proper foot placement;  Ambulation/Gait Ambulation/Gait assistance: Min assist Gait Distance (Feet): 175 Feet Assistive device: Rolling walker (2 wheeled)   Gait  velocity: decreased   General Gait Details: Patient required min VCs for erect posture, increasing hip extension and to reduce left toe in focusing on improving hip abduction/ER for better gait mechanics. Patient also required cues to reduce excessive UE weight bearing and to improve knee extension during stance for better gait mechanics; Pt does exhibit increased toe in especially with fatigue. He also exhibits increased forward flexed gait with increased fatigue. Concerned for patient maintaining posterior hip precautions. patient and family educated on importance of taking his time with gait to improve safety with mechanics;   Stairs             Wheelchair Mobility    Modified Rankin (Stroke Patients Only)       Balance Overall balance assessment: Needs assistance Sitting-balance support: No upper extremity supported Sitting balance-Leahy Scale: Good     Standing balance support: Bilateral upper extremity supported Standing balance-Leahy Scale: Fair                              Cognition Arousal/Alertness: Awake/alert Behavior During Therapy: WFL for tasks assessed/performed Overall Cognitive Status: Within Functional Limits for tasks assessed                                        Exercises Total Joint Exercises Ankle Circles/Pumps: AROM;15 reps;Both Short Arc Quad: Strengthening;Both;15 reps Heel Slides: AROM;Right;15 reps Hip ABduction/ADduction: AROM;Both;15 reps Straight Leg Raises: Strengthening;Left;10 reps    General Comments  Pertinent Vitals/Pain Pain Assessment: 0-10 Pain Score: 2  Pain Location: left hip Pain Descriptors / Indicators: Aching;Sore Pain Intervention(s): Limited activity within patient's tolerance;Monitored during session;Repositioned    Home Living                      Prior Function            PT Goals (current goals can now be found in the care plan section) Acute Rehab PT  Goals Patient Stated Goal: go home PT Goal Formulation: With patient Time For Goal Achievement: 04/20/20 Potential to Achieve Goals: Good Progress towards PT goals: Progressing toward goals    Frequency    BID      PT Plan Current plan remains appropriate    Co-evaluation              AM-PAC PT "6 Clicks" Mobility   Outcome Measure  Help needed turning from your back to your side while in a flat bed without using bedrails?: None Help needed moving from lying on your back to sitting on the side of a flat bed without using bedrails?: None Help needed moving to and from a bed to a chair (including a wheelchair)?: A Little Help needed standing up from a chair using your arms (e.g., wheelchair or bedside chair)?: A Little Help needed to walk in hospital room?: A Little Help needed climbing 3-5 steps with a railing? : A Little 6 Click Score: 20    End of Session Equipment Utilized During Treatment: Gait belt Activity Tolerance: Patient tolerated treatment well Patient left: in chair;with call bell/phone within reach;with chair alarm set;with family/visitor present Nurse Communication: Mobility status PT Visit Diagnosis: Other abnormalities of gait and mobility (R26.89);Muscle weakness (generalized) (M62.81);Pain Pain - Right/Left: Left Pain - part of body: Hip     Time: 8099-8338 PT Time Calculation (min) (ACUTE ONLY): 24 min  Charges:  $Gait Training: 8-22 mins $Therapeutic Exercise: 8-22 mins                        Cyril Railey PT, DPT 04/08/2020, 10:45 AM

## 2020-04-08 NOTE — Discharge Summary (Signed)
Physician Discharge Summary  Patient ID: Bryan Frey MRN: [id number] DOB/AGE: August 10, 1946 74 y.o.  Admit date: 04/06/2020 Discharge date: 04/08/2020  Admission Diagnoses:  S/P revision of total hip [Z96.649]  Surgeries:Procedure(s):  Left hip revision arthroplasty (acetabular component)  SURGEON:  Marciano Sequin. M.D.  ASSISTANT: Cassell Smiles, PA-C (present and scrubbed throughout the case, critical for assistance with exposure, retraction, instrumentation, and closure)  ANESTHESIA: general  ESTIMATED BLOOD LOSS: 600 mL  FLUIDS REPLACED: 2250 mL of crystalloid, 250 mL of colloid  DRAINS: 2 medium drains to a Hemovac reservoir  IMPLANTS UTILIZED: DePuy 68 mm OD Pinnacle Gription Revision acetabular component, +4 mm 10 degree Pinnacle Marathon polyethylene insert, 36 mm M-SPEC +1.5 mm hip ball, 4 - 6.5 mm cancellous bone screws, and 3 - 5.0 mm peripheral bone screws  Discharge Diagnoses: Patient Active Problem List   Diagnosis Date Noted  . 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 Gait abnormality 03/27/2017  . Pressure injury of skin 11/08/2016  . Lumbar radiculopathy, chronic 11/07/2016  . 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 Renal carcinoma ([place])   . 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 Calculus of kidney 02/09/2014  . Nephrolithiasis   . High cholesterol     Past Medical History:  Diagnosis Date  . 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 Anemia    vitamin b12 deficiency  . Anxiety    with diagnosis  . 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 DDD (degenerative disc disease), lumbar   . Depression   . Gait abnormality 03/27/2017  . Gall stones   . High cholesterol   . History of kidney stones   . HOH (hard of hearing)   . Hypertension    EKG,  chest  4/13 EPIC  . left renal ca dx'd 03/2012   tumor on kidney. removed tumor and all is clear since then  . Nephrolithiasis   . Neuromuscular disorder ([facility])    facial nerve pain  . Spinal stenosis of lumbar  region    with neurogenic claudication  . Stroke [facility]) 2010   heat stroke d/t a hot day and working under a house.no problems after  . Trigeminal neuralgia      Transfusion: n/a   Consultants (if any):   Discharged Condition: Improved  Hospital Course: Bryan Frey is an 74 y.o. male who was admitted 04/06/2020 with a diagnosis of prosthetic loosening, status post left total hip arthroplasty and went to the operating room on 04/06/2020 and underwent left hip revision arthroplasty (acetabular component). The patient received perioperative antibiotics for prophylaxis (see below). The patient tolerated the procedure well and was transported to PACU in stable condition. After meeting PACU criteria, the patient was subsequently transferred to the Orthopaedics/Rehabilitation unit.   The patient received DVT prophylaxis in the form of early mobilization, Lovenox, Foot Pumps and TED hose. A sacral pad had been placed and heels were elevated off of the bed with rolled towels in order to protect skin integrity. Foley catheter was discontinued on postoperative day #0. Wound drains were discontinued on postoperative day #2. The surgical incision was healing well without signs of infection.  Physical therapy was initiated postoperatively for transfers, gait training, and strengthening. Occupational therapy was initiated for activities of daily living and evaluation for assisted devices. Rehabilitation goals were reviewed in detail with the patient. The patient made steady progress with physical therapy and physical therapy recommended discharge to Home.  The patient achieved the preliminary goals of this hospitalization and was felt to be medically and orthopaedically appropriate for discharge.  He was given perioperative antibiotics:  Anti-infectives (From admission, onward)   Start     Dose/Rate Route Frequency Ordered Stop   04/06/20 1730  ceFAZolin (ANCEF) IVPB 2g/100 mL premix        2 g 200  mL/hr over 30 Minutes Intravenous Every 6 hours 04/06/20 1546 04/07/20 1208   04/06/20 0631  ceFAZolin (ANCEF) 2-4 GM/100ML-% IVPB       Note to Pharmacy: Sylvester Harder   : cabinet override      04/06/20 0631 04/06/20 0752   04/06/20 0600  ceFAZolin (ANCEF) IVPB 2g/100 mL premix        2 g 200 mL/hr over 30 Minutes Intravenous On call to O.R. 04/06/20 0058 04/06/20 1131    .  Recent vital signs:  Vitals:   04/07/20 2331 04/08/20 0740  BP: (!) 148/62 118/65  Pulse: 80 76  Resp: 18 16  Temp: 97.9 F (36.6 C) 98 F (36.7 C)  SpO2: 95% 94%    Recent laboratory studies:  Recent Labs    04/07/20 0350 04/08/20 0450  WBC 10.9* 7.2  HGB 9.1* 8.9*  HCT 27.7* 25.6*  PLT 303 268    Diagnostic Studies: DG Chest 2 View  Result Date: 04/03/2020 CLINICAL DATA:  Preoperative study for hip surgery. No chest complaints. EXAM: CHEST - 2 VIEW COMPARISON:  Chest x-ray dated January 27, 2014. FINDINGS: The heart size and mediastinal contours are within normal limits. Normal pulmonary vascularity. No focal consolidation, pleural effusion, or pneumothorax. No acute osseous abnormality. IMPRESSION: No active cardiopulmonary disease. Electronically Signed   By: Titus Dubin M.D.   On: 04/03/2020 14:21   DG Hip Port Unilat With Pelvis 1V Left  Result Date: 04/06/2020 CLINICAL DATA:  Left hip revision arthroplasty. EXAM: DG HIP (WITH OR WITHOUT PELVIS) 1V PORT LEFT COMPARISON:  CT left hip dated Feb 28, 2020. FINDINGS: The left hip demonstrates a revision arthroplasty without evidence of hardware failure or complication. New acetabular component. Prior right total hip arthroplasty. There is no fracture or dislocation. The alignment is anatomic. Post-surgical changes and surgical drains noted in the soft tissue surrounding the left hip. IMPRESSION: 1. Interval left hip revision arthroplasty without acute postoperative complication. Electronically Signed   By: Titus Dubin M.D.   On: 04/06/2020 16:45    DG HIP PORT UNILAT WITH PELVIS 1V LEFT  Result Date: 04/06/2020 CLINICAL DATA:  Total hip revision EXAM: DG HIP (WITH OR WITHOUT PELVIS) 1V PORT LEFT COMPARISON:  05/14/2015 FINDINGS: Changes consistent with left hip revision are noted. No acute bony or soft tissue abnormality is seen. IMPRESSION: Left hip prosthesis revision Electronically Signed   By: Inez Catalina M.D.   On: 04/06/2020 13:33    Discharge Medications:   Allergies as of 04/08/2020      Reactions   Morphine And Related Other (See Comments)   Hallucinations   Statins    Other reaction(s): Muscle Pain      Medication List    STOP taking these medications   diclofenac 75 MG EC tablet Commonly known as: VOLTAREN     TAKE these medications   acetaminophen 500 MG tablet Commonly known as: TYLENOL Take 1,000 mg by mouth daily.   amLODipine 10 MG tablet Commonly known as: NORVASC Take 10 mg by mouth daily.   carbamazepine 200 MG tablet Commonly known as: TEGRETOL Take 400 mg  by mouth 3 (three) times daily.   celecoxib 200 MG capsule Commonly known as: CELEBREX Take 1 capsule (200 mg total) by mouth 2 (two) times daily.   cyclobenzaprine 10 MG tablet Commonly known as: FLEXERIL Take 10 mg by mouth daily. Takes in the morning   diphenhydramine-acetaminophen 25-500 MG Tabs tablet Commonly known as: TYLENOL PM Take 2 tablets by mouth at bedtime.   DULoxetine 60 MG capsule Commonly known as: CYMBALTA Take 120 mg by mouth daily.   enoxaparin 40 MG/0.4ML injection Commonly known as: LOVENOX Inject 0.4 mLs (40 mg total) into the skin daily for 14 days.   hydrochlorothiazide 25 MG tablet Commonly known as: HYDRODIURIL Take 25 mg by mouth daily.   Melatonin 5 MG Caps Take 5 mg by mouth at bedtime.   oxyCODONE 5 MG immediate release tablet Commonly known as: Oxy IR/ROXICODONE Take 1 tablet (5 mg total) by mouth every 4 (four) hours as needed for moderate pain (pain score 4-6).   tamsulosin 0.4 MG Caps  capsule Commonly known as: FLOMAX Take 1 capsule (0.4 mg total) by mouth daily. Start taking on: April 09, 2020   traMADol 50 MG tablet Commonly known as: ULTRAM Take 1-2 tablets (50-100 mg total) by mouth every 4 (four) hours as needed for moderate pain. What changed:   how much to take  when to take this   traMADol 50 MG tablet Commonly known as: ULTRAM Take 1 tablet (50 mg total) by mouth every 4 (four) hours as needed for moderate pain. What changed: You were already taking a medication with the same name, and this prescription was added. Make sure you understand how and when to take each.   vitamin B-12 1000 MCG tablet Commonly known as: CYANOCOBALAMIN Take 1,000 mcg by mouth daily.            Durable Medical Equipment  (From admission, onward)         Start     Ordered   04/06/20 1546  DME Walker rolling  Once       Question:  Patient needs a walker to treat with the following condition  Answer:  S/P total hip arthroplasty   04/06/20 1546   04/06/20 1546  DME Bedside commode  Once       Question:  Patient needs a bedside commode to treat with the following condition  Answer:  S/P total hip arthroplasty   04/06/20 1546          Disposition: home with home health PT      Follow-up Information    Dereck Leep, MD On 05/17/2020.   Specialty: Orthopedic Surgery Why: at 2:15pm Contact information: [place] [id number] [place], PA-C 04/08/2020, 11:15 AM

## 2020-04-08 NOTE — Progress Notes (Signed)
  Subjective: 2 Days Post-Op Procedure(s) (LRB): TOTAL HIP REVISION (Left) Patient reports pain as well-controlled.   Patient is well, but has had some minor complaints of urinary frequency with incomplete voiding last PM. Patient also had episode of weakness and diziness when attempting to stand to use the bathroom. This is similar to an episode that happened when patient was taking Lyrica. Plan is to go Home after hospital stay. Negative for chest pain and shortness of breath Fever: no Gastrointestinal: negative for nausea and vomiting.   Patient has not had a bowel movement.  Objective: Vital signs in last 24 hours: Temp:  [97.9 F (36.6 C)-99.1 F (37.3 C)] 98 F (36.7 C) (07/04 0740) Pulse Rate:  [76-80] 76 (07/04 0740) Resp:  [16-20] 16 (07/04 0740) BP: (118-148)/(62-65) 118/65 (07/04 0740) SpO2:  [94 %-95 %] 94 % (07/04 0740)  Intake/Output from previous day:  Intake/Output Summary (Last 24 hours) at 04/08/2020 1059 Last data filed at 04/08/2020 0955 Gross per 24 hour  Intake 960 ml  Output 1695 ml  Net -735 ml    Intake/Output this shift: Total I/O In: 480 [P.O.:480] Out: 350 [Urine:350]  Labs: Recent Labs    04/07/20 0350 04/08/20 0450  HGB 9.1* 8.9*   Recent Labs    04/07/20 0350 04/08/20 0450  WBC 10.9* 7.2  RBC 2.95* 2.83*  HCT 27.7* 25.6*  PLT 303 268   No results for input(s): NA, K, CL, CO2, BUN, CREATININE, GLUCOSE, CALCIUM in the last 72 hours. No results for input(s): LABPT, INR in the last 72 hours.   EXAM General - Patient is Alert, Appropriate and Oriented Extremity - Neurovascular intact Dorsiflexion/Plantar flexion intact Compartment soft; patient continues to sit with lower leg internally rotated Dressing/Incision -clean, dry, no drainage, Hemovac in place.  Motor Function - intact, moving foot and toes well on exam.  Cardiovascular- Regular rate and rhythm, no murmurs/rubs/gallops Respiratory- Lungs clear to auscultation  bilaterally Gastrointestinal- soft, nontender and active bowel sounds   Assessment/Plan: 2 Days Post-Op Procedure(s) (LRB): TOTAL HIP REVISION (Left) Active Problems:   S/P revision of total hip  Estimated body mass index is 32.86 kg/m as calculated from the following:   Height as of this encounter: 5\' 10"  (1.778 m).   Weight as of this encounter: 103.9 kg. Advance diet Up with therapy Discharge home with home health pending BM.  Hemovac removed. Mini pressure dressing applied.   DVT Prophylaxis - Lovenox, Ted hose and foot pumps Weight-Bearing as tolerated to left leg  Cassell Smiles, PA-C [facility] Orthopaedic Surgery 04/08/2020, 10:59 AM

## 2020-04-08 NOTE — Plan of Care (Signed)
Pt's pain is well-controlled. No prn pain med needed this shift. Pt states he is having trouble emptying bladder completely at one time. Flomax taken in the past per pt. Problem: Education: Goal: Knowledge of the prescribed therapeutic regimen will improve Outcome: Progressing Goal: Understanding of discharge needs will improve Outcome: Progressing Goal: Individualized Educational Video(s) Outcome: Progressing   Problem: Activity: Goal: Ability to avoid complications of mobility impairment will improve Outcome: Progressing Goal: Ability to tolerate increased activity will improve Outcome: Progressing   Problem: Clinical Measurements: Goal: Postoperative complications will be avoided or minimized Outcome: Progressing   Problem: Pain Management: Goal: Pain level will decrease with appropriate interventions Outcome: Progressing   Problem: Skin Integrity: Goal: Will show signs of wound healing Outcome: Progressing   Problem: Education: Goal: Knowledge of General Education information will improve Description: Including pain rating scale, medication(s)/side effects and non-pharmacologic comfort measures Outcome: Progressing   Problem: Health Behavior/Discharge Planning: Goal: Ability to manage health-related needs will improve Outcome: Progressing   Problem: Clinical Measurements: Goal: Ability to maintain clinical measurements within normal limits will improve Outcome: Progressing Goal: Will remain free from infection Outcome: Progressing Goal: Diagnostic test results will improve Outcome: Progressing Goal: Respiratory complications will improve Outcome: Progressing Goal: Cardiovascular complication will be avoided Outcome: Progressing   Problem: Activity: Goal: Risk for activity intolerance will decrease Outcome: Progressing   Problem: Nutrition: Goal: Adequate nutrition will be maintained Outcome: Progressing   Problem: Coping: Goal: Level of anxiety will  decrease Outcome: Progressing   Problem: Elimination: Goal: Will not experience complications related to bowel motility Outcome: Progressing Goal: Will not experience complications related to urinary retention Outcome: Progressing   Problem: Pain Managment: Goal: General experience of comfort will improve Outcome: Progressing   Problem: Safety: Goal: Ability to remain free from injury will improve Outcome: Progressing   Problem: Skin Integrity: Goal: Risk for impaired skin integrity will decrease Outcome: Progressing

## 2020-04-08 NOTE — Progress Notes (Signed)
Patient is being discharged home this afternoon following a BM. DC & Rx instructions given. Answered family's questions and they acknowledged understanding. IV removed, belongings packed. Wife bedside to transport home.

## 2020-04-09 DIAGNOSIS — G709 Myoneural disorder, unspecified: Secondary | ICD-10-CM | POA: Diagnosis not present

## 2020-04-09 DIAGNOSIS — M5416 Radiculopathy, lumbar region: Secondary | ICD-10-CM | POA: Diagnosis not present

## 2020-04-09 DIAGNOSIS — E785 Hyperlipidemia, unspecified: Secondary | ICD-10-CM | POA: Diagnosis not present

## 2020-04-09 DIAGNOSIS — Z7901 Long term (current) use of anticoagulants: Secondary | ICD-10-CM | POA: Diagnosis not present

## 2020-04-09 DIAGNOSIS — H9190 Unspecified hearing loss, unspecified ear: Secondary | ICD-10-CM | POA: Diagnosis not present

## 2020-04-09 DIAGNOSIS — F419 Anxiety disorder, unspecified: Secondary | ICD-10-CM | POA: Diagnosis not present

## 2020-04-09 DIAGNOSIS — G629 Polyneuropathy, unspecified: Secondary | ICD-10-CM | POA: Diagnosis not present

## 2020-04-09 DIAGNOSIS — G500 Trigeminal neuralgia: Secondary | ICD-10-CM | POA: Diagnosis not present

## 2020-04-09 DIAGNOSIS — Z87442 Personal history of urinary calculi: Secondary | ICD-10-CM | POA: Diagnosis not present

## 2020-04-09 DIAGNOSIS — I10 Essential (primary) hypertension: Secondary | ICD-10-CM | POA: Diagnosis not present

## 2020-04-09 DIAGNOSIS — T84031D Mechanical loosening of internal left hip prosthetic joint, subsequent encounter: Secondary | ICD-10-CM | POA: Diagnosis not present

## 2020-04-09 DIAGNOSIS — Z85528 Personal history of other malignant neoplasm of kidney: Secondary | ICD-10-CM | POA: Diagnosis not present

## 2020-04-09 DIAGNOSIS — D519 Vitamin B12 deficiency anemia, unspecified: Secondary | ICD-10-CM | POA: Diagnosis not present

## 2020-04-09 DIAGNOSIS — F329 Major depressive disorder, single episode, unspecified: Secondary | ICD-10-CM | POA: Diagnosis not present

## 2020-04-09 DIAGNOSIS — Z8673 Personal history of transient ischemic attack (TIA), and cerebral infarction without residual deficits: Secondary | ICD-10-CM | POA: Diagnosis not present

## 2020-04-09 DIAGNOSIS — M5136 Other intervertebral disc degeneration, lumbar region: Secondary | ICD-10-CM | POA: Diagnosis not present

## 2020-04-09 DIAGNOSIS — M48062 Spinal stenosis, lumbar region with neurogenic claudication: Secondary | ICD-10-CM | POA: Diagnosis not present

## 2020-04-10 ENCOUNTER — Encounter: Payer: Self-pay | Admitting: Orthopedic Surgery

## 2020-04-11 LAB — AEROBIC/ANAEROBIC CULTURE W GRAM STAIN (SURGICAL/DEEP WOUND): Culture: NO GROWTH

## 2020-04-12 LAB — AEROBIC/ANAEROBIC CULTURE W GRAM STAIN (SURGICAL/DEEP WOUND)

## 2020-04-20 DIAGNOSIS — T84031D Mechanical loosening of internal left hip prosthetic joint, subsequent encounter: Secondary | ICD-10-CM | POA: Diagnosis not present

## 2020-04-20 DIAGNOSIS — F419 Anxiety disorder, unspecified: Secondary | ICD-10-CM | POA: Diagnosis not present

## 2020-04-20 DIAGNOSIS — G500 Trigeminal neuralgia: Secondary | ICD-10-CM | POA: Diagnosis not present

## 2020-04-20 DIAGNOSIS — Z7901 Long term (current) use of anticoagulants: Secondary | ICD-10-CM | POA: Diagnosis not present

## 2020-04-20 DIAGNOSIS — Z85528 Personal history of other malignant neoplasm of kidney: Secondary | ICD-10-CM | POA: Diagnosis not present

## 2020-04-20 DIAGNOSIS — G629 Polyneuropathy, unspecified: Secondary | ICD-10-CM | POA: Diagnosis not present

## 2020-04-20 DIAGNOSIS — I10 Essential (primary) hypertension: Secondary | ICD-10-CM | POA: Diagnosis not present

## 2020-04-20 DIAGNOSIS — F329 Major depressive disorder, single episode, unspecified: Secondary | ICD-10-CM | POA: Diagnosis not present

## 2020-04-20 DIAGNOSIS — D519 Vitamin B12 deficiency anemia, unspecified: Secondary | ICD-10-CM | POA: Diagnosis not present

## 2020-04-20 DIAGNOSIS — G709 Myoneural disorder, unspecified: Secondary | ICD-10-CM | POA: Diagnosis not present

## 2020-04-20 DIAGNOSIS — H9190 Unspecified hearing loss, unspecified ear: Secondary | ICD-10-CM | POA: Diagnosis not present

## 2020-04-20 DIAGNOSIS — M5416 Radiculopathy, lumbar region: Secondary | ICD-10-CM | POA: Diagnosis not present

## 2020-04-20 DIAGNOSIS — M48062 Spinal stenosis, lumbar region with neurogenic claudication: Secondary | ICD-10-CM | POA: Diagnosis not present

## 2020-04-20 DIAGNOSIS — M5136 Other intervertebral disc degeneration, lumbar region: Secondary | ICD-10-CM | POA: Diagnosis not present

## 2020-04-20 DIAGNOSIS — Z8673 Personal history of transient ischemic attack (TIA), and cerebral infarction without residual deficits: Secondary | ICD-10-CM | POA: Diagnosis not present

## 2020-04-20 DIAGNOSIS — E785 Hyperlipidemia, unspecified: Secondary | ICD-10-CM | POA: Diagnosis not present

## 2020-04-20 DIAGNOSIS — Z87442 Personal history of urinary calculi: Secondary | ICD-10-CM | POA: Diagnosis not present

## 2020-05-06 DIAGNOSIS — T84031D Mechanical loosening of internal left hip prosthetic joint, subsequent encounter: Secondary | ICD-10-CM | POA: Diagnosis not present

## 2020-05-17 DIAGNOSIS — Z96642 Presence of left artificial hip joint: Secondary | ICD-10-CM | POA: Diagnosis not present

## 2020-05-17 DIAGNOSIS — M5441 Lumbago with sciatica, right side: Secondary | ICD-10-CM | POA: Diagnosis not present

## 2020-05-17 DIAGNOSIS — Z96649 Presence of unspecified artificial hip joint: Secondary | ICD-10-CM | POA: Diagnosis not present

## 2020-05-18 ENCOUNTER — Other Ambulatory Visit: Payer: Self-pay | Admitting: Orthopedic Surgery

## 2020-05-18 DIAGNOSIS — E7849 Other hyperlipidemia: Secondary | ICD-10-CM | POA: Diagnosis not present

## 2020-05-18 DIAGNOSIS — M5441 Lumbago with sciatica, right side: Secondary | ICD-10-CM

## 2020-05-18 DIAGNOSIS — E538 Deficiency of other specified B group vitamins: Secondary | ICD-10-CM | POA: Diagnosis not present

## 2020-05-22 DIAGNOSIS — M5136 Other intervertebral disc degeneration, lumbar region: Secondary | ICD-10-CM | POA: Diagnosis not present

## 2020-05-22 DIAGNOSIS — D500 Iron deficiency anemia secondary to blood loss (chronic): Secondary | ICD-10-CM | POA: Diagnosis not present

## 2020-05-22 DIAGNOSIS — Z125 Encounter for screening for malignant neoplasm of prostate: Secondary | ICD-10-CM | POA: Diagnosis not present

## 2020-05-22 DIAGNOSIS — E538 Deficiency of other specified B group vitamins: Secondary | ICD-10-CM | POA: Diagnosis not present

## 2020-05-22 DIAGNOSIS — Z1212 Encounter for screening for malignant neoplasm of rectum: Secondary | ICD-10-CM | POA: Diagnosis not present

## 2020-05-22 DIAGNOSIS — E7849 Other hyperlipidemia: Secondary | ICD-10-CM | POA: Diagnosis not present

## 2020-06-05 ENCOUNTER — Other Ambulatory Visit: Payer: Self-pay

## 2020-06-05 ENCOUNTER — Ambulatory Visit
Admission: RE | Admit: 2020-06-05 | Discharge: 2020-06-05 | Disposition: A | Discharge status: Home / Self Care | Payer: PPO | Source: Ambulatory Visit | Attending: Orthopedic Surgery | Admitting: Orthopedic Surgery

## 2020-06-05 DIAGNOSIS — M48061 Spinal stenosis, lumbar region without neurogenic claudication: Secondary | ICD-10-CM | POA: Diagnosis not present

## 2020-06-05 DIAGNOSIS — M5441 Lumbago with sciatica, right side: Secondary | ICD-10-CM | POA: Diagnosis not present

## 2020-06-05 DIAGNOSIS — M47817 Spondylosis without myelopathy or radiculopathy, lumbosacral region: Secondary | ICD-10-CM | POA: Diagnosis not present

## 2020-06-05 DIAGNOSIS — M4316 Spondylolisthesis, lumbar region: Secondary | ICD-10-CM | POA: Diagnosis not present

## 2020-06-05 DIAGNOSIS — M47816 Spondylosis without myelopathy or radiculopathy, lumbar region: Secondary | ICD-10-CM | POA: Diagnosis not present

## 2020-06-15 DIAGNOSIS — M5441 Lumbago with sciatica, right side: Secondary | ICD-10-CM | POA: Diagnosis not present

## 2020-06-18 DIAGNOSIS — Z1212 Encounter for screening for malignant neoplasm of rectum: Secondary | ICD-10-CM | POA: Diagnosis not present

## 2020-06-18 DIAGNOSIS — Z859 Personal history of malignant neoplasm, unspecified: Secondary | ICD-10-CM | POA: Diagnosis not present

## 2020-06-18 DIAGNOSIS — L570 Actinic keratosis: Secondary | ICD-10-CM | POA: Diagnosis not present

## 2020-06-18 DIAGNOSIS — L738 Other specified follicular disorders: Secondary | ICD-10-CM | POA: Diagnosis not present

## 2020-06-18 DIAGNOSIS — Z86018 Personal history of other benign neoplasm: Secondary | ICD-10-CM | POA: Diagnosis not present

## 2020-06-18 DIAGNOSIS — Z872 Personal history of diseases of the skin and subcutaneous tissue: Secondary | ICD-10-CM | POA: Diagnosis not present

## 2020-06-18 DIAGNOSIS — L578 Other skin changes due to chronic exposure to nonionizing radiation: Secondary | ICD-10-CM | POA: Diagnosis not present

## 2020-06-21 DIAGNOSIS — D500 Iron deficiency anemia secondary to blood loss (chronic): Secondary | ICD-10-CM | POA: Diagnosis not present

## 2020-06-23 LAB — COLOGUARD: COLOGUARD: POSITIVE — AB

## 2020-06-25 DIAGNOSIS — N200 Calculus of kidney: Secondary | ICD-10-CM | POA: Diagnosis not present

## 2020-09-20 ENCOUNTER — Other Ambulatory Visit (HOSPITAL_COMMUNITY): Payer: Self-pay | Admitting: Internal Medicine

## 2020-09-20 ENCOUNTER — Other Ambulatory Visit: Payer: Self-pay | Admitting: Internal Medicine

## 2020-09-20 DIAGNOSIS — R1312 Dysphagia, oropharyngeal phase: Secondary | ICD-10-CM | POA: Diagnosis not present

## 2020-09-20 DIAGNOSIS — Z85528 Personal history of other malignant neoplasm of kidney: Secondary | ICD-10-CM | POA: Diagnosis not present

## 2020-09-20 DIAGNOSIS — R195 Other fecal abnormalities: Secondary | ICD-10-CM | POA: Diagnosis not present

## 2020-09-20 DIAGNOSIS — R1310 Dysphagia, unspecified: Secondary | ICD-10-CM

## 2020-09-20 DIAGNOSIS — K5730 Diverticulosis of large intestine without perforation or abscess without bleeding: Secondary | ICD-10-CM | POA: Diagnosis not present

## 2020-10-02 ENCOUNTER — Ambulatory Visit
Admission: RE | Admit: 2020-10-02 | Discharge: 2020-10-02 | Disposition: A | Discharge status: Home / Self Care | Payer: PPO | Source: Ambulatory Visit | Attending: Internal Medicine | Admitting: Internal Medicine

## 2020-10-02 ENCOUNTER — Other Ambulatory Visit: Payer: Self-pay

## 2020-10-02 DIAGNOSIS — R1310 Dysphagia, unspecified: Secondary | ICD-10-CM

## 2020-10-10 ENCOUNTER — Other Ambulatory Visit: Payer: Self-pay | Admitting: Internal Medicine

## 2020-10-10 DIAGNOSIS — R933 Abnormal findings on diagnostic imaging of other parts of digestive tract: Secondary | ICD-10-CM

## 2020-10-10 DIAGNOSIS — Y844 Aspiration of fluid as the cause of abnormal reaction of the patient, or of later complication, without mention of misadventure at the time of the procedure: Secondary | ICD-10-CM

## 2020-10-18 ENCOUNTER — Other Ambulatory Visit: Payer: Medicare HMO | Attending: Internal Medicine

## 2020-10-30 ENCOUNTER — Other Ambulatory Visit: Payer: Self-pay

## 2020-10-30 ENCOUNTER — Ambulatory Visit
Admission: RE | Admit: 2020-10-30 | Discharge: 2020-10-30 | Disposition: A | Discharge status: Home / Self Care | Payer: Medicare HMO | Source: Ambulatory Visit | Attending: Internal Medicine | Admitting: Internal Medicine

## 2020-10-30 DIAGNOSIS — R1310 Dysphagia, unspecified: Secondary | ICD-10-CM | POA: Insufficient documentation

## 2020-10-30 DIAGNOSIS — Y844 Aspiration of fluid as the cause of abnormal reaction of the patient, or of later complication, without mention of misadventure at the time of the procedure: Secondary | ICD-10-CM | POA: Diagnosis not present

## 2020-10-30 DIAGNOSIS — R933 Abnormal findings on diagnostic imaging of other parts of digestive tract: Secondary | ICD-10-CM | POA: Insufficient documentation

## 2020-10-30 NOTE — Progress Notes (Signed)
Modified Barium Swallow Progress Note  Patient Details  Name: Bryan Frey MRN: [id number] Date of Birth: 22-Sep-1946  Today's Date: 10/30/2020  Modified Barium Swallow completed.  Full report located under Chart Review in the Imaging Section.  Brief recommendations include the following:  Clinical Impression  Pt demonstrates adequate oropharyngeal abilities when consuming nectar thick liquids, thin liquids, puree and solids and whole barium tablet. Pt's oral phase is functional with no oral residue present post swallow. His swallow is initiated at the level of the pyriform sinuses with no penetration or aspiration observed. Pt reports that he experiences a globus sensation at the level of clavicle when eating solid meats like steak. Liquids are helpful in "getting the food to go down." He also describes severe s/s of heartburn including chest pressure. Education provided to pt and his wife on reflux precautions. At this time, recommend age-appropriate diet of pt's comfort, thin liquids and medicine whole with thin liquids. Given report of reflux symptoms, would consider the addition of reflux medicine should his MD choose.   Swallow Evaluation Recommendations       SLP Diet Recommendations: Regular solids;Thin liquid   Liquid Administration via: Cup;Straw   Medication Administration: Whole meds with liquid   Supervision: Patient able to self feed   Compensations: Minimize environmental distractions;Slow rate;Small sips/bites   Postural Changes: Seated upright at 90 degrees   Oral Care Recommendations: Oral care BID       Alejandrina Raimer B. Rutherford Nail M.S., CCC-SLP, [place] Pathologist Rehabilitation Services Office [place] 10/30/2020,1:42 PM

## 2020-11-29 ENCOUNTER — Other Ambulatory Visit: Payer: Self-pay

## 2020-11-29 ENCOUNTER — Other Ambulatory Visit
Admission: RE | Admit: 2020-11-29 | Discharge: 2020-11-29 | Disposition: A | Discharge status: Home / Self Care | Payer: Medicare HMO | Source: Ambulatory Visit | Attending: Internal Medicine | Admitting: Internal Medicine

## 2020-11-29 DIAGNOSIS — Z01812 Encounter for preprocedural laboratory examination: Secondary | ICD-10-CM | POA: Insufficient documentation

## 2020-11-29 DIAGNOSIS — Z20822 Contact with and (suspected) exposure to covid-19: Secondary | ICD-10-CM | POA: Insufficient documentation

## 2020-11-29 LAB — SARS CORONAVIRUS 2 (TAT 6-24 HRS): SARS Coronavirus 2: NEGATIVE

## 2020-11-30 ENCOUNTER — Encounter: Payer: Self-pay | Admitting: Internal Medicine

## 2020-12-03 ENCOUNTER — Encounter
Admission: RE | Disposition: A | Discharge status: Home / Self Care | Payer: Self-pay | Source: Home / Self Care | Attending: Internal Medicine

## 2020-12-03 ENCOUNTER — Ambulatory Visit
Admission: RE | Admit: 2020-12-03 | Discharge: 2020-12-03 | Disposition: A | Discharge status: Home / Self Care | Payer: Medicare HMO | Attending: Internal Medicine | Admitting: Internal Medicine

## 2020-12-03 ENCOUNTER — Ambulatory Visit: Payer: Medicare HMO | Admitting: Anesthesiology

## 2020-12-03 DIAGNOSIS — D122 Benign neoplasm of ascending colon: Secondary | ICD-10-CM | POA: Diagnosis not present

## 2020-12-03 DIAGNOSIS — D124 Benign neoplasm of descending colon: Secondary | ICD-10-CM | POA: Insufficient documentation

## 2020-12-03 DIAGNOSIS — K5730 Diverticulosis of large intestine without perforation or abscess without bleeding: Secondary | ICD-10-CM | POA: Diagnosis not present

## 2020-12-03 DIAGNOSIS — Z791 Long term (current) use of non-steroidal anti-inflammatories (NSAID): Secondary | ICD-10-CM | POA: Diagnosis not present

## 2020-12-03 DIAGNOSIS — Z888 Allergy status to other drugs, medicaments and biological substances status: Secondary | ICD-10-CM | POA: Diagnosis not present

## 2020-12-03 DIAGNOSIS — K621 Rectal polyp: Secondary | ICD-10-CM | POA: Insufficient documentation

## 2020-12-03 DIAGNOSIS — R195 Other fecal abnormalities: Secondary | ICD-10-CM | POA: Diagnosis present

## 2020-12-03 DIAGNOSIS — Z79899 Other long term (current) drug therapy: Secondary | ICD-10-CM | POA: Diagnosis not present

## 2020-12-03 DIAGNOSIS — Z885 Allergy status to narcotic agent status: Secondary | ICD-10-CM | POA: Insufficient documentation

## 2020-12-03 DIAGNOSIS — K640 First degree hemorrhoids: Secondary | ICD-10-CM | POA: Diagnosis not present

## 2020-12-03 HISTORY — DX: Male erectile dysfunction, unspecified: N52.9

## 2020-12-03 HISTORY — DX: Internuclear ophthalmoplegia, unspecified eye: H51.20

## 2020-12-03 HISTORY — PX: COLONOSCOPY: SHX5424

## 2020-12-03 SURGERY — COLONOSCOPY
Anesthesia: General

## 2020-12-03 MED ORDER — PROPOFOL 10 MG/ML IV BOLUS
INTRAVENOUS | Status: DC | PRN
  Administered 2020-12-03: 40 mg via INTRAVENOUS

## 2020-12-03 MED ORDER — PROPOFOL 500 MG/50ML IV EMUL
INTRAVENOUS | Status: DC | PRN
  Administered 2020-12-03: 200 ug/kg/min via INTRAVENOUS

## 2020-12-03 MED ORDER — PHENYLEPHRINE HCL (PRESSORS) 10 MG/ML IV SOLN
INTRAVENOUS | Status: DC | PRN
  Administered 2020-12-03 (×2): 100 ug via INTRAVENOUS

## 2020-12-03 MED ORDER — SODIUM CHLORIDE 0.9 % IV SOLN
INTRAVENOUS | Status: DC
  Administered 2020-12-03: 20 mL/h via INTRAVENOUS

## 2020-12-03 NOTE — Anesthesia Preprocedure Evaluation (Signed)
Anesthesia Evaluation  Patient identified by MRN, date of birth, ID band Patient awake    Reviewed: Allergy & Precautions, H&P , NPO status , Patient's Chart, lab work & pertinent test results  History of Anesthesia Complications Negative for: history of anesthetic complications  Airway Mallampati: III  TM Distance: >3 FB Neck ROM: full    Dental  (+) Teeth Intact   Pulmonary neg pulmonary ROS, neg sleep apnea, neg COPD, Patient abstained from smoking.Not current smoker,    Pulmonary exam normal breath sounds clear to auscultation       Cardiovascular Exercise Tolerance: Good METShypertension, Pt. 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 removed tumor and all is clear since               then No date: Nephrolithiasis No date: Neuromuscular disorder ([place])     Comment:  facial nerve pain No date: Spinal stenosis of  lumbar region     Comment:  with neurogenic claudication 2010: Stroke ([place])     Comment:  heat stroke d/t a hot day and working under a house.no               problems after No date: Trigeminal neuralgia  Past Surgical History: No date: BACK SURGERY 12/24/2017: CATARACT EXTRACTION W/PHACO; Left     Comment:  Procedure: CATARACT EXTRACTION PHACO AND INTRAOCULAR               LENS PLACEMENT (IOC);  Surgeon: Eulogio Bear, MD;                Location: [facility] ORS;  Service: Ophthalmology;  Laterality:              Left;  Lot # [id number] H [place]: 00:30.4 AP%: 9.4 CDE: 2.84 01/26/2018: CATARACT EXTRACTION W/PHACO; Right     Comment:  Procedure: CATARACT EXTRACTION PHACO AND INTRAOCULAR               LENS PLACEMENT (IOC) RIGHT;  Surgeon: Eulogio Bear,              MD;  Location: [place];  Service:               Ophthalmology;  Laterality: Right; No date: COLONOSCOPY No date: EYE SURGERY; Bilateral     Comment:  cataract extra tions 03/18/2017: IR RADIOLOGIST EVAL & MGMT No date: JOINT REPLACEMENT; Bilateral     Comment:  left hip replaced x 2, right hip x 1 08/07/2010: Left total hip arthroplasty 02/09/2012: LUMBAR LAMINECTOMY/DECOMPRESSION MICRODISCECTOMY     Comment:  Procedure: LUMBAR LAMINECTOMY/DECOMPRESSION               MICRODISCECTOMY 2 LEVELS;  Surgeon: Kristeen Miss, MD;                Location: [place] [facility];  Service: Neurosurgery;                Laterality: Bilateral;  Bilateral Lumbar two-three,lumbar              four-five laminectomies 02/09/2014: NEPHROLITHOTOMY; Right     Comment:  Procedure: NEPHROLITHOTOMY PERCUTANEOUS;  Surgeon:               Franchot Gallo, MD;  Location: [facility];  Service:               Urology;  Laterality: Right; 07/13/2014: Right total hip arthroplasty 05/14/2015: TOTAL HIP REVISION; Left     Comment:  Procedure: TOTAL HIP REVISION;  Surgeon: Dereck Leep,              MD;  Location: [facility];  Service: Orthopedics;                 Laterality: Left;  BMI    Body Mass Index: 32.86 kg/m      Reproductive/Obstetrics negative OB ROS                             Anesthesia Physical  Anesthesia Plan  ASA: II  Anesthesia Plan: General   Post-op Pain Management:    Induction: Intravenous  PONV Risk Score and Plan: 2 and Ondansetron, Dexamethasone and Treatment may vary due to age or medical condition  Airway Management Planned: Nasal Cannula  Additional Equipment: None  Intra-op Plan:   Post-operative Plan:   Informed Consent: I have reviewed the patients History and Physical, chart, labs and discussed the procedure including the risks, benefits and alternatives for the proposed anesthesia with the patient or authorized representative who has indicated his/her understanding and acceptance.     Dental Advisory Given  Plan Discussed with: Anesthesiologist, CRNA and Surgeon  Anesthesia Plan Comments: (Discussed risks of anesthesia with patient, including possibility of difficulty with spontaneous ventilation under anesthesia necessitating airway intervention, PONV, and rare risks such as cardiac or respiratory or neurological events. Patient understands.)        Anesthesia Quick Evaluation

## 2020-12-03 NOTE — Interval H&P Note (Signed)
History and Physical Interval Note:  12/03/2020 10:08 AM  Bryan Frey  has presented today for surgery, with the diagnosis of DYSPHAGIA.  The various methods of treatment have been discussed with the patient and family. After consideration of risks, benefits and other options for treatment, the patient has consented to  Procedure(s): ESOPHAGOGASTRODUODENOSCOPY (EGD) (N/A) as a surgical intervention.  The patient's history has been reviewed, patient examined, no change in status, stable for surgery.  I have reviewed the patient's chart and labs.  Questions were answered to the patient's satisfaction.     Garfield, Hanna

## 2020-12-03 NOTE — Anesthesia Procedure Notes (Signed)
Date/Time: 12/03/2020 10:23 AM Performed by: Nelda Marseille, CRNA Pre-anesthesia Checklist: Patient identified, Emergency Drugs available, Suction available, Patient being monitored and Timeout performed Oxygen Delivery Method: Nasal cannula

## 2020-12-03 NOTE — Transfer of Care (Signed)
Immediate Anesthesia Transfer of Care Note  Patient: Bryan Frey  Procedure(s) Performed: COLONOSCOPY  Patient Location: PACU  Anesthesia Type:General  Level of Consciousness: sedated  Airway & Oxygen Therapy: Patient Spontanous Breathing and Patient connected to nasal cannula oxygen  Post-op Assessment: Report given to RN and Post -op Vital signs reviewed and stable  Post vital signs: Reviewed and stable  Last Vitals:  Vitals Value Taken Time  BP 91/47 12/03/20 1043  Temp    Pulse 68 12/03/20 1043  Resp    SpO2 96 % 12/03/20 1043  Vitals shown include unvalidated device data.  Last Pain:  Vitals:   12/03/20 1043  TempSrc:   PainSc: 0-No pain         Complications: No complications documented.

## 2020-12-03 NOTE — Anesthesia Postprocedure Evaluation (Signed)
Anesthesia Post Note  Patient: Bryan Frey  Procedure(s) Performed: COLONOSCOPY  Patient location during evaluation: Endoscopy Anesthesia Type: General Level of consciousness: awake and alert Pain management: pain level controlled Vital Signs Assessment: post-procedure vital signs reviewed and stable Respiratory status: spontaneous breathing, nonlabored ventilation, respiratory function stable and patient connected to nasal cannula oxygen Cardiovascular status: blood pressure returned to baseline and stable Postop Assessment: no apparent nausea or vomiting Anesthetic complications: no   No complications documented.   Last Vitals:  Vitals:   12/03/20 1053 12/03/20 1103  BP: (!) 101/35 140/69  Pulse: 69 70  Resp: 15 10  Temp:    SpO2: 97% 94%    Last Pain:  Vitals:   12/03/20 1103  TempSrc:   PainSc: 0-No pain                 Arita Miss

## 2020-12-03 NOTE — Op Note (Signed)
[facility] Patient Name: Bryan Frey Procedure Date: 12/03/2020 10:12 AM MRN: [id number] Account #: [id number] Date of Birth: 1946-05-15 Admit Type: Outpatient Age: 75 Room: [facility] ENDO ROOM 2 Gender: Male Note Status: Finalized Procedure:             Colonoscopy Indications:           Positive Cologuard test Providers:             Benay Pike. Alice Reichert MD, MD Referring MD:          Rusty Aus, MD (Referring MD) Medicines:             Propofol per Anesthesia Complications:         No immediate complications. Procedure:             Pre-Anesthesia Assessment:                        - The risks and benefits of the procedure and the                         sedation options and risks were discussed with the                         patient. All questions were answered and informed                         consent was obtained.                        - Patient identification and proposed procedure were                         verified prior to the procedure by the nurse. The                         procedure was verified in the procedure room.                        - ASA Grade Assessment: III - A patient with severe                         systemic disease.                        - After reviewing the risks and benefits, the patient                         was deemed in satisfactory condition to undergo the                         procedure.                        After obtaining informed consent, the colonoscope was                         passed under direct vision. Throughout the procedure,                         the patient's blood pressure, pulse, and  oxygen                         saturations were monitored continuously. The                         Colonoscope was introduced through the anus and                         advanced to the the cecum, identified by appendiceal                         orifice and ileocecal valve. The colonoscopy was                          performed without difficulty. The patient tolerated                         the procedure well. The quality of the bowel                         preparation was good. The ileocecal valve, appendiceal                         orifice, and rectum were photographed. Findings:      The perianal and digital rectal examinations were normal. Pertinent       negatives include normal sphincter tone and no palpable rectal lesions.      Two sessile polyps were found in the rectum. The polyps were 3 to 4 mm       in size. These polyps were removed with a jumbo cold forceps. Resection       and retrieval were complete.      Two sessile polyps were found in the descending colon. The polyps were 5       to 7 mm in size. These polyps were removed with a hot snare. Resection       and retrieval were complete.      Two sessile polyps were found in the distal ascending colon. The polyps       were 6 to 9 mm in size. These polyps were removed with a hot snare.       Resection and retrieval were complete.      Two sessile polyps were found in the proximal ascending colon. The       polyps were 4 to 6 mm in size. These polyps were removed with a jumbo       cold forceps. Resection and retrieval were complete.      A few medium-mouthed diverticula were found in the sigmoid colon.      Non-bleeding internal hemorrhoids were found during retroflexion. The       hemorrhoids were Grade I (internal hemorrhoids that do not prolapse).      The exam was otherwise without abnormality. Impression:            - Two 3 to 4 mm polyps in the rectum, removed with a                         jumbo cold forceps. Resected and retrieved.                        -  Two 5 to 7 mm polyps in the descending colon,                         removed with a hot snare. Resected and retrieved.                        - Two 6 to 9 mm polyps in the distal ascending colon,                         removed with a hot snare.  Resected and retrieved.                        - Two 4 to 6 mm polyps in the proximal ascending                         colon, removed with a jumbo cold forceps. Resected and                         retrieved.                        - Diverticulosis in the sigmoid colon.                        - Non-bleeding internal hemorrhoids.                        - The examination was otherwise normal. Recommendation:        - Patient has a contact number available for                         emergencies. The signs and symptoms of potential                         delayed complications were discussed with the patient.                         Return to normal activities tomorrow. Written                         discharge instructions were provided to the patient.                        - Resume previous diet.                        - Continue present medications.                        - Repeat colonoscopy is recommended for surveillance.                         The colonoscopy date will be determined after                         pathology results from today's exam become available                         for review.                        -  Return to GI office PRN.                        - The findings and recommendations were discussed with                         the patient. Procedure Code(s):     --- Professional ---                        [phone], Colonoscopy, flexible; with removal of                         tumor(s), polyp(s), or other lesion(s) by snare                         technique                        45380, 5, Colonoscopy, flexible; with biopsy, single                         or multiple Diagnosis Code(s):     --- Professional ---                        K57.30, Diverticulosis of large intestine without                         perforation or abscess without bleeding                        R19.5, Other fecal abnormalities                        K64.0, First degree hemorrhoids                         K63.5, Polyp of colon                        K62.1, Rectal polyp CPT copyright 2019 American Medical Association. All rights reserved. The codes documented in this report are preliminary and upon coder review may  be revised to meet current compliance requirements. Efrain Sella MD, MD 12/03/2020 10:44:25 AM This report has been signed electronically. Number of Addenda: 0 Note Initiated On: 12/03/2020 10:12 AM Scope Withdrawal Time: 0 hours 12 minutes 20 seconds  Total Procedure Duration: 0 hours 19 minutes 55 seconds  Estimated Blood Loss:  Estimated blood loss: none.      [facility]

## 2020-12-03 NOTE — H&P (Signed)
Outpatient short stay form Pre-procedure 12/03/2020 10:07 AM Bryan Frey, M.D.  Primary Physician: Bryan Frey, M.D.  Reason for visit:  Positive fecal dna (Cologuard) stool test.  History of present illness:Patient is a pleasant 75 y/o male/male presenting on referral from primary care provider for a POSITIVE Cologuard result. Patient denies change in bowel habits, rectal bleeding, weight loss or abdominal pain.     Current Facility-Administered Medications:  .  0.9 %  sodium chloride infusion, , Intravenous, Continuous, Hypericum, Benay Pike, MD, Last Rate: 20 mL/hr at 12/03/20 1005, 20 mL/hr at 12/03/20 1005  Medications Prior to Admission  Medication Sig Dispense Refill Last Dose  . acetaminophen (TYLENOL) 500 MG tablet Take 1,000 mg by mouth daily.   12/02/2020 at Unknown time  . amLODipine (NORVASC) 10 MG tablet Take 10 mg by mouth daily.    12/03/2020 at 0800  . carbamazepine (TEGRETOL) 200 MG tablet Take 400 mg by mouth 3 (three) times daily.    12/03/2020 at 0800  . celecoxib (CELEBREX) 200 MG capsule Take 1 capsule (200 mg total) by mouth 2 (two) times daily. 90 capsule 0 12/02/2020 at Unknown time  . cyclobenzaprine (FLEXERIL) 10 MG tablet Take 10 mg by mouth daily. Takes in the morning   12/02/2020 at Unknown time  . diphenhydramine-acetaminophen (TYLENOL PM) 25-500 MG TABS Take 2 tablets by mouth at bedtime.   12/02/2020 at Unknown time  . DULoxetine (CYMBALTA) 60 MG capsule Take 120 mg by mouth daily.   12/02/2020 at Unknown time  . hydrochlorothiazide (HYDRODIURIL) 25 MG tablet Take 25 mg by mouth daily.    12/03/2020 at 0800  . Melatonin 5 MG CAPS Take 5 mg by mouth at bedtime.   12/02/2020 at Unknown time  . tamsulosin (FLOMAX) 0.4 MG CAPS capsule Take 1 capsule (0.4 mg total) by mouth daily. 30 capsule 1 12/02/2020 at Unknown time  . traMADol (ULTRAM) 50 MG tablet Take 1-2 tablets (50-100 mg total) by mouth every 4 (four) hours as needed for moderate pain. (Patient taking  differently: Take 100 mg by mouth 2 (two) times daily.) 60 tablet 0 12/02/2020 at Unknown time  . traMADol (ULTRAM) 50 MG tablet Take 1 tablet (50 mg total) by mouth every 4 (four) hours as needed for moderate pain. 30 tablet 0 12/02/2020 at Unknown time  . vitamin B-12 (CYANOCOBALAMIN) 1000 MCG tablet Take 1,000 mcg by mouth daily.   12/02/2020 at Unknown time  . enoxaparin (LOVENOX) 40 MG/0.4ML injection Inject 0.4 mLs (40 mg total) into the skin daily for 14 days. 5.6 mL 0   . oxyCODONE (OXY IR/ROXICODONE) 5 MG immediate release tablet Take 1 tablet (5 mg total) by mouth every 4 (four) hours as needed for moderate pain (pain score 4-6). 30 tablet 0      Allergies  Allergen Reactions  . Morphine And Related Other (See Comments)    Hallucinations  . 5-Alpha Reductase Inhibitors   . Statins     Other reaction(s): Muscle Pain     Past Medical History:  Diagnosis Date  . 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 Anemia    vitamin b12 deficiency  . Anxiety    with diagnosis  . 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 DDD (degenerative disc disease), lumbar   . Depression   . ED (erectile dysfunction)   . Gait abnormality 03/27/2017  . Gall stones   . High cholesterol   . History of kidney stones   . HOH (hard  of hearing)   . Hypertension    EKG,  chest  4/13 EPIC  . Internuclear ophthalmoplegia   . left renal ca dx'd 03/2012   tumor on kidney. removed tumor and all is clear since then  . Nephrolithiasis   . Neuromuscular disorder (HCC)    facial nerve pain  . Spinal stenosis of lumbar region    with neurogenic claudication  . Stroke [facility]) 2010   heat stroke d/t a hot day and working under a house.no problems after  . Trigeminal neuralgia     Review of systems:  Otherwise negative.    Physical Exam  Gen: Alert, oriented. Appears stated age.  HEENT: [place]/AT. PERRLA. Lungs: CTA, no wheezes. CV: RR nl S1, S2. Abd: soft, benign, no masses. BS+ Ext: No edema. Pulses 2+    Planned  procedures: Proceed with colonoscopy. The patient understands the nature of the planned procedure, indications, risks, alternatives and potential complications including but not limited to bleeding, infection, perforation, damage to internal organs and possible oversedation/side effects from anesthesia. The patient agrees and gives consent to proceed.  Please refer to procedure notes for findings, recommendations and patient disposition/instructions.     Bryan Frey, M.D. Gastroenterology 12/03/2020  10:07 AM

## 2020-12-05 LAB — SURGICAL PATHOLOGY

## 2020-12-06 ENCOUNTER — Encounter: Payer: Self-pay | Admitting: Internal Medicine

## 2021-02-23 ENCOUNTER — Emergency Department
Admission: EM | Admit: 2021-02-23 | Discharge: 2021-02-23 | Disposition: A | Discharge status: Home / Self Care | Payer: Medicare HMO | Attending: Emergency Medicine | Admitting: Emergency Medicine

## 2021-02-23 ENCOUNTER — Emergency Department: Payer: Medicare HMO

## 2021-02-23 ENCOUNTER — Encounter: Payer: Self-pay | Admitting: Emergency Medicine

## 2021-02-23 ENCOUNTER — Other Ambulatory Visit: Payer: Self-pay

## 2021-02-23 DIAGNOSIS — Z79899 Other long term (current) drug therapy: Secondary | ICD-10-CM | POA: Insufficient documentation

## 2021-02-23 DIAGNOSIS — S0181XA Laceration without foreign body of other part of head, initial encounter: Secondary | ICD-10-CM | POA: Insufficient documentation

## 2021-02-23 DIAGNOSIS — I10 Essential (primary) hypertension: Secondary | ICD-10-CM | POA: Diagnosis not present

## 2021-02-23 DIAGNOSIS — Z96643 Presence of artificial hip joint, bilateral: Secondary | ICD-10-CM | POA: Diagnosis not present

## 2021-02-23 DIAGNOSIS — Z23 Encounter for immunization: Secondary | ICD-10-CM | POA: Diagnosis not present

## 2021-02-23 DIAGNOSIS — Y92094 Garage of other non-institutional residence as the place of occurrence of the external cause: Secondary | ICD-10-CM | POA: Insufficient documentation

## 2021-02-23 DIAGNOSIS — S0990XA Unspecified injury of head, initial encounter: Secondary | ICD-10-CM

## 2021-02-23 DIAGNOSIS — Z85528 Personal history of other malignant neoplasm of kidney: Secondary | ICD-10-CM | POA: Insufficient documentation

## 2021-02-23 DIAGNOSIS — W01198A Fall on same level from slipping, tripping and stumbling with subsequent striking against other object, initial encounter: Secondary | ICD-10-CM | POA: Insufficient documentation

## 2021-02-23 DIAGNOSIS — S0083XA Contusion of other part of head, initial encounter: Secondary | ICD-10-CM

## 2021-02-23 MED ORDER — TETANUS-DIPHTH-ACELL PERTUSSIS 5-2.5-18.5 LF-MCG/0.5 IM SUSY
0.5000 mL | PREFILLED_SYRINGE | Freq: Once | INTRAMUSCULAR | Status: AC
  Administered 2021-02-23: 0.5 mL via INTRAMUSCULAR
  Filled 2021-02-23: qty 0.5

## 2021-02-23 MED ORDER — LIDOCAINE-EPINEPHRINE-TETRACAINE (LET) TOPICAL GEL
3.0000 mL | Freq: Once | TOPICAL | Status: AC
  Administered 2021-02-23: 3 mL via TOPICAL
  Filled 2021-02-23: qty 3

## 2021-02-23 NOTE — ED Notes (Signed)
ED Provider at bedside. 

## 2021-02-23 NOTE — ED Triage Notes (Signed)
Pt reports that he was in his garage and tripped over equipment hit his head on the cement. He denies taking blood thinner nor did he LOC.

## 2021-02-23 NOTE — Discharge Instructions (Addendum)
Patient may shower and get laceration site wet.  Keep laceration site clean.  You may apply a thin layer of antibiotic ointment once daily.  Return to the ER for any severe headaches, vomiting, vision changes, confusion, warmth redness or drainage.  Please follow-up with primary care provider, walk-in clinic or ER in 5 to 6 days for suture removal.

## 2021-02-23 NOTE — ED Provider Notes (Signed)
[facility] [facility] EMERGENCY DEPARTMENT Provider Note   CSN: [id number] Arrival date & time: 02/23/21  2009     History Chief Complaint  Patient presents with  . Fall  . Laceration    Bryan Frey is a 75 y.o. male [place] to the [place] apartment for evaluation of head injury.  Patient states he was in his garage, leaned over to pick something up, lost his balance and fell forward hitting his head on the floor.  He did not lose consciousness.  Denies a headache.  No nausea vomiting or vision changes.  Denies any neck back shoulder hip or lower extremity discomfort.  He still been able to ambulate.  No dizziness or lightheadedness.  He is not on any blood thinners.  He did suffer a laceration to the left forehead.  HPI     Past Medical History:  Diagnosis Date  . 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 Anemia    vitamin b12 deficiency  . Anxiety    with diagnosis  . 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 DDD (degenerative disc disease), lumbar   . Depression   . ED (erectile dysfunction)   . Gait abnormality 03/27/2017  . Gall stones   . High cholesterol   . History of kidney stones   . HOH (hard of hearing)   . Hypertension    EKG,  chest  4/13 EPIC  . Internuclear ophthalmoplegia   . left renal ca dx'd 03/2012   tumor on kidney. removed tumor and all is clear since then  . Nephrolithiasis   . Neuromuscular disorder (HCC)    facial nerve pain  . Spinal stenosis of lumbar region    with neurogenic claudication  . Stroke [facility]) 2010   heat stroke d/t a hot day and working under a house.no problems after  . Trigeminal neuralgia     Patient Active Problem List   Diagnosis Date Noted  . 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 Gait abnormality 03/27/2017  . Pressure injury of skin 11/08/2016  . Lumbar radiculopathy, chronic 11/07/2016  . 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 Renal carcinoma ([place])   . 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 Calculus  of kidney 02/09/2014  . Nephrolithiasis   . High cholesterol     Past Surgical History:  Procedure Laterality Date  . BACK SURGERY    . CATARACT EXTRACTION W/PHACO Left 12/24/2017   Procedure: CATARACT EXTRACTION PHACO AND INTRAOCULAR LENS PLACEMENT (IOC);  Surgeon: Eulogio Bear, MD;  Location: [facility] ORS;  Service: Ophthalmology;  Laterality: Left;  Lot # [id number] H [place]: 00:30.4 AP%: 9.4 CDE: 2.84  . CATARACT EXTRACTION W/PHACO Right 01/26/2018   Procedure: CATARACT EXTRACTION PHACO AND INTRAOCULAR LENS PLACEMENT ([place]) RIGHT;  Surgeon: Eulogio Bear, MD;  Location: [place];  Service: Ophthalmology;  Laterality: Right;  . COLONOSCOPY    . COLONOSCOPY  12/03/2020   Procedure: COLONOSCOPY;  Surgeon: Toledo, Benay Pike, MD;  Location: [facility] ENDOSCOPY;  Service: Gastroenterology;;  . EYE SURGERY Bilateral    cataract extra tions  . IR RADIOLOGIST EVAL & MGMT  03/18/2017  . JOINT REPLACEMENT Bilateral    left hip replaced x 2, right hip x 1  . Left total hip arthroplasty  08/07/2010  . LUMBAR LAMINECTOMY/DECOMPRESSION MICRODISCECTOMY  02/09/2012   Procedure: LUMBAR LAMINECTOMY/DECOMPRESSION MICRODISCECTOMY 2 LEVELS;  Surgeon: Kristeen Miss, MD;  Location: [place] NEURO ORS;  Service: Neurosurgery;  Laterality: Bilateral;  Bilateral  Lumbar two-three,lumbar four-five laminectomies  . NEPHROLITHOTOMY Right 02/09/2014   Procedure: NEPHROLITHOTOMY PERCUTANEOUS;  Surgeon: Franchot Gallo, MD;  Location: [facility] ORS;  Service: Urology;  Laterality: Right;  . Right total hip arthroplasty  07/13/2014  . TOTAL HIP REVISION Left 05/14/2015   Procedure: TOTAL HIP REVISION;  Surgeon: Dereck Leep, MD;  Location: [facility] ORS;  Service: Orthopedics;  Laterality: Left;  . TOTAL HIP REVISION Left 04/06/2020   Procedure: TOTAL HIP REVISION;  Surgeon: Dereck Leep, MD;  Location: [facility] ORS;  Service: Orthopedics;  Laterality: Left;       Family History  Problem Relation Age of Onset  . Anesthesia problems  Neg Hx     Social History   Tobacco Use  . Smoking status: Never Smoker  . Smokeless tobacco: Never Used  Vaping Use  . Vaping Use: Never used  Substance Use Topics  . Alcohol use: No  . Drug use: No    Types: Other-see comments    Comment: tramadol    Home Medications Prior to Admission medications   Medication Sig Start Date End Date Taking? Authorizing Provider  acetaminophen (TYLENOL) 500 MG tablet Take 1,000 mg by mouth daily.    [provider]  amLODipine (NORVASC) 10 MG tablet Take 10 mg by mouth daily.     [provider]  carbamazepine (TEGRETOL) 200 MG tablet Take 400 mg by mouth 3 (three) times daily.     [provider]  celecoxib (CELEBREX) 200 MG capsule Take 1 capsule (200 mg total) by mouth 2 (two) times daily. 04/08/20   Fausto Skillern, PA-C  cyclobenzaprine (FLEXERIL) 10 MG tablet Take 10 mg by mouth daily. Takes in the morning    [provider]  diphenhydramine-acetaminophen (TYLENOL PM) 25-500 MG TABS Take 2 tablets by mouth at bedtime.    [provider]  DULoxetine (CYMBALTA) 60 MG capsule Take 120 mg by mouth daily.    [provider]  enoxaparin (LOVENOX) 40 MG/0.4ML injection Inject 0.4 mLs (40 mg total) into the skin daily for 14 days. 04/08/20 04/22/20  Fausto Skillern, PA-C  hydrochlorothiazide (HYDRODIURIL) 25 MG tablet Take 25 mg by mouth daily.     [provider]  Melatonin 5 MG CAPS Take 5 mg by mouth at bedtime.    [provider]  oxyCODONE (OXY IR/ROXICODONE) 5 MG immediate release tablet Take 1 tablet (5 mg total) by mouth every 4 (four) hours as needed for moderate pain (pain score 4-6). 04/08/20   Fausto Skillern, PA-C  tamsulosin (FLOMAX) 0.4 MG CAPS capsule Take 1 capsule (0.4 mg total) by mouth daily. 04/09/20   Fausto Skillern, PA-C  traMADol (ULTRAM) 50 MG tablet Take 1-2 tablets (50-100 mg total) by mouth every 4 (four) hours as needed for moderate pain. Patient  taking differently: Take 100 mg by mouth 2 (two) times daily. 05/17/15   Watt Climes, PA  traMADol (ULTRAM) 50 MG tablet Take 1 tablet (50 mg total) by mouth every 4 (four) hours as needed for moderate pain. 04/08/20   Fausto Skillern, PA-C  vitamin B-12 (CYANOCOBALAMIN) 1000 MCG tablet Take 1,000 mcg by mouth daily.    [provider]    Allergies    Morphine and related, 5-alpha reductase inhibitors, and Statins  Review of Systems   Review of Systems  Eyes: Negative for photophobia and visual disturbance.  Respiratory: Negative for shortness of breath.   Cardiovascular: Negative for chest pain.  Gastrointestinal: Negative for  nausea and vomiting.  Musculoskeletal: Negative for back pain, gait problem, joint swelling, myalgias and neck pain.  Skin: Positive for wound.  Neurological: Negative for dizziness, light-headedness, numbness and headaches.    Physical Exam Updated Vital Signs BP 114/70 (BP Location: Right Arm)   Pulse 90   Temp 98.7 F (37.1 C) (Oral)   Resp 20   Ht 5\' 11"  (1.803 m)   Wt 99.8 kg   SpO2 94%   BMI 30.68 kg/m   Physical Exam Constitutional:      Appearance: He is well-developed.  HENT:     Head: Normocephalic and atraumatic.  Eyes:     Conjunctiva/sclera: Conjunctivae normal.  Cardiovascular:     Rate and Rhythm: Normal rate.  Pulmonary:     Effort: Pulmonary effort is normal. No respiratory distress.  Musculoskeletal:        General: Normal range of motion.     Cervical back: Normal range of motion.     Comments: No tenderness to palpation along the cervical thoracic or lumbar spinous process.  No shoulder or elbow tenderness to palpation.  Patient has good range of motion of both hips with internal X rotation with no discomfort and is nontender to palpation.the knees and ankles.  No tenderness along the clavicles or sternum.  Skin:    General: Skin is warm.     Findings: No rash.     Comments: 3 cm laceration just above the left  eyebrow, irregular, no visible or palpable foreign body.  Bleeding well controlled.  Neurological:     General: No focal deficit present.     Mental Status: He is alert and oriented to person, place, and time. Mental status is at baseline.     Cranial Nerves: No cranial nerve deficit.     Motor: No weakness.     Gait: Gait normal.  Psychiatric:        Behavior: Behavior normal.        Thought Content: Thought content normal.     ED Results / Procedures / Treatments   Labs (all labs ordered are listed, but only abnormal results are displayed) Labs Reviewed - No data to display  EKG None  Radiology CT Head Wo Contrast  Result Date: 02/23/2021 CLINICAL DATA:  Head injury after tripping. EXAM: CT HEAD WITHOUT CONTRAST TECHNIQUE: Contiguous axial images were obtained from the base of the skull through the vertex without intravenous contrast. COMPARISON:  None. FINDINGS: Brain: No evidence of acute infarction, hemorrhage, hydrocephalus, extra-axial collection or mass lesion/mass effect. Vascular: No hyperdense vessel or unexpected calcification. Skull: Normal. Negative for fracture or focal lesion. Sinuses/Orbits: No acute finding. Other: None. IMPRESSION: No acute intracranial abnormality seen. Electronically Signed   By: Marijo Conception M.D.   On: 02/23/2021 21:44    Procedures .Marland KitchenLaceration Repair  Date/Time: 02/23/2021 11:03 PM Performed by: Duanne Guess, PA-C Authorized by: Duanne Guess, PA-C   Consent:    Consent obtained:  Verbal   Consent given by:  Patient   Risks discussed:  Infection, pain and poor wound healing Universal protocol:    Patient identity confirmed:  Verbally with patient Anesthesia:    Anesthesia method:  Topical application   Topical anesthetic:  LET Laceration details:    Location:  Face   Face location:  Forehead   Length (cm):  3   Depth (mm):  2 Exploration:    Limited defect created (wound extended): no     Hemostasis achieved with:  LET   Wound exploration: wound explored through full range of motion     Contaminated: no   Treatment:    Area cleansed with:  Povidone-iodine and saline   Amount of cleaning:  Standard   Irrigation solution:  Sterile saline and sterile water   Irrigation method:  Syringe and tap   Visualized foreign bodies/material removed: no     Debridement:  None Skin repair:    Repair method:  Sutures   Suture size:  6-0   Suture material:  Nylon   Suture technique:  Simple interrupted   Number of sutures:  6 Repair type:    Repair type:  Simple Post-procedure details:    Dressing:  Adhesive bandage   Procedure completion:  Tolerated     Medications Ordered in ED Medications  lidocaine-EPINEPHrine-tetracaine (LET) topical gel (3 mLs Topical Given 02/23/21 2125)  Tdap (BOOSTRIX) injection 0.5 mL (0.5 mLs Intramuscular Given 02/23/21 2125)    ED Course  I have reviewed the triage vital signs and the nursing notes.  Pertinent labs & imaging results that were available during my care of the patient were reviewed by me and considered in my medical decision making (see chart for details).    MDM Rules/Calculators/A&P                          75 year old male fell just prior to arrival.  Patient states he was leaning forward to pick something up, lost his balance and fell forward hitting his head on the concrete.  No LOC, headache, nausea or vomiting.  CT of the head obtained, negative.  Patient with small laceration to the forehead, approximately 3 cm.  Patient with no other complaints.  Physical exam unremarkable.  Vital signs stable.  Laceration site thoroughly irrigated cleansed and repaired with sutures.  Patient tolerated procedure well and will follow-up in 5 to 6 days for suture removal.  He understands signs symptoms to return to the ER for.  Tetanus updated today in the ED. Final Clinical Impression(s) / ED Diagnoses Final diagnoses:  Injury of head, initial encounter  Laceration of  forehead, initial encounter  Contusion of face, initial encounter    Rx / DC Orders ED Discharge Orders    None       Renata Caprice 02/23/21 2306    Hinda Kehr, MD 02/24/21 0003

## 2021-03-06 DEATH — deceased

## 2022-06-03 ENCOUNTER — Other Ambulatory Visit: Payer: Self-pay | Admitting: Student

## 2022-06-03 DIAGNOSIS — Z96649 Presence of unspecified artificial hip joint: Secondary | ICD-10-CM

## 2022-06-04 ENCOUNTER — Ambulatory Visit
Admission: RE | Admit: 2022-06-04 | Discharge: 2022-06-04 | Disposition: A | Discharge status: Home / Self Care | Payer: Medicare HMO | Source: Ambulatory Visit | Attending: Student | Admitting: Student

## 2022-06-04 DIAGNOSIS — Z96649 Presence of unspecified artificial hip joint: Secondary | ICD-10-CM

## 2022-06-06 ENCOUNTER — Inpatient Hospital Stay: Payer: Medicare HMO | Admitting: Anesthesiology

## 2022-06-06 ENCOUNTER — Encounter
Admission: RE | Disposition: A | Discharge status: Home Health | Payer: Self-pay | Source: Ambulatory Visit | Attending: Orthopedic Surgery

## 2022-06-06 ENCOUNTER — Encounter: Payer: Self-pay | Admitting: Orthopedic Surgery

## 2022-06-06 ENCOUNTER — Other Ambulatory Visit: Payer: Self-pay

## 2022-06-06 ENCOUNTER — Inpatient Hospital Stay
Admission: RE | Admit: 2022-06-06 | Discharge: 2022-06-07 | DRG: 502 | Disposition: A | Discharge status: Home Health | Payer: Medicare HMO | Source: Ambulatory Visit | Attending: Orthopedic Surgery | Admitting: Orthopedic Surgery

## 2022-06-06 DIAGNOSIS — Z981 Arthrodesis status: Secondary | ICD-10-CM | POA: Diagnosis not present

## 2022-06-06 DIAGNOSIS — Z79899 Other long term (current) drug therapy: Secondary | ICD-10-CM | POA: Diagnosis not present

## 2022-06-06 DIAGNOSIS — Z885 Allergy status to narcotic agent status: Secondary | ICD-10-CM

## 2022-06-06 DIAGNOSIS — G500 Trigeminal neuralgia: Secondary | ICD-10-CM | POA: Diagnosis present

## 2022-06-06 DIAGNOSIS — T8452XA Infection and inflammatory reaction due to internal left hip prosthesis, initial encounter: Secondary | ICD-10-CM | POA: Diagnosis present

## 2022-06-06 DIAGNOSIS — Z888 Allergy status to other drugs, medicaments and biological substances status: Secondary | ICD-10-CM

## 2022-06-06 DIAGNOSIS — I10 Essential (primary) hypertension: Secondary | ICD-10-CM | POA: Diagnosis present

## 2022-06-06 DIAGNOSIS — Y831 Surgical operation with implant of artificial internal device as the cause of abnormal reaction of the patient, or of later complication, without mention of misadventure at the time of the procedure: Secondary | ICD-10-CM | POA: Diagnosis present

## 2022-06-06 DIAGNOSIS — Z96642 Presence of left artificial hip joint: Secondary | ICD-10-CM

## 2022-06-06 DIAGNOSIS — Z96641 Presence of right artificial hip joint: Secondary | ICD-10-CM | POA: Diagnosis present

## 2022-06-06 DIAGNOSIS — E785 Hyperlipidemia, unspecified: Secondary | ICD-10-CM | POA: Diagnosis present

## 2022-06-06 HISTORY — PX: INCISION AND DRAINAGE HIP: SHX1801

## 2022-06-06 LAB — SURGICAL PCR SCREEN
MRSA, PCR: NEGATIVE
Staphylococcus aureus: POSITIVE — AB

## 2022-06-06 SURGERY — IRRIGATION AND DEBRIDEMENT HIP
Anesthesia: General | Site: Hip | Laterality: Left

## 2022-06-06 MED ORDER — PROPOFOL 10 MG/ML IV BOLUS
INTRAVENOUS | Status: DC | PRN
  Administered 2022-06-06: 120 mg via INTRAVENOUS

## 2022-06-06 MED ORDER — ONDANSETRON HCL 4 MG/2ML IJ SOLN
INTRAMUSCULAR | Status: DC | PRN
  Administered 2022-06-06: 4 mg via INTRAVENOUS

## 2022-06-06 MED ORDER — MAGNESIUM HYDROXIDE 400 MG/5ML PO SUSP
30.0000 mL | Freq: Every day | ORAL | Status: DC
  Administered 2022-06-07: 30 mL via ORAL
  Filled 2022-06-06: qty 30

## 2022-06-06 MED ORDER — TRANEXAMIC ACID-NACL 1000-0.7 MG/100ML-% IV SOLN
INTRAVENOUS | Status: AC
  Filled 2022-06-06: qty 100

## 2022-06-06 MED ORDER — VITAMIN B-12 1000 MCG PO TABS
1000.0000 ug | ORAL_TABLET | Freq: Every day | ORAL | Status: DC
  Administered 2022-06-07: 1000 ug via ORAL
  Filled 2022-06-06: qty 1

## 2022-06-06 MED ORDER — EPHEDRINE SULFATE (PRESSORS) 50 MG/ML IJ SOLN
INTRAMUSCULAR | Status: DC | PRN
  Administered 2022-06-06: 5 mg via INTRAVENOUS
  Administered 2022-06-06: 10 mg via INTRAVENOUS
  Administered 2022-06-06: 5 mg via INTRAVENOUS
  Administered 2022-06-06 (×2): 10 mg via INTRAVENOUS
  Administered 2022-06-06: 5 mg via INTRAVENOUS
  Administered 2022-06-06: 10 mg via INTRAVENOUS
  Administered 2022-06-06: 5 mg via INTRAVENOUS

## 2022-06-06 MED ORDER — SODIUM CHLORIDE 0.9 % IV SOLN: INTRAVENOUS | Status: DC

## 2022-06-06 MED ORDER — SENNOSIDES-DOCUSATE SODIUM 8.6-50 MG PO TABS
1.0000 | ORAL_TABLET | Freq: Two times a day (BID) | ORAL | Status: DC
  Administered 2022-06-06 – 2022-06-07 (×2): 1 via ORAL
  Filled 2022-06-06 (×2): qty 1

## 2022-06-06 MED ORDER — ACETAMINOPHEN 10 MG/ML IV SOLN
1000.0000 mg | Freq: Four times a day (QID) | INTRAVENOUS | Status: DC
  Administered 2022-06-07 (×2): 1000 mg via INTRAVENOUS
  Filled 2022-06-06 (×2): qty 100

## 2022-06-06 MED ORDER — FENTANYL CITRATE (PF) 100 MCG/2ML IJ SOLN
INTRAMUSCULAR | Status: AC
  Administered 2022-06-06: 25 ug via INTRAVENOUS
  Filled 2022-06-06: qty 2

## 2022-06-06 MED ORDER — OXYCODONE HCL 5 MG/5ML PO SOLN: 5.0000 mg | Freq: Once | ORAL | Status: AC | PRN

## 2022-06-06 MED ORDER — OXYCODONE HCL 5 MG PO TABS: 10.0000 mg | ORAL_TABLET | ORAL | Status: DC | PRN

## 2022-06-06 MED ORDER — 0.9 % SODIUM CHLORIDE (POUR BTL) OPTIME
TOPICAL | Status: DC | PRN
  Administered 2022-06-06: 1000 mL

## 2022-06-06 MED ORDER — SURGIPHOR WOUND IRRIGATION SYSTEM - OPTIME: TOPICAL | Status: DC | PRN

## 2022-06-06 MED ORDER — CYCLOBENZAPRINE HCL 10 MG PO TABS
10.0000 mg | ORAL_TABLET | Freq: Every day | ORAL | Status: DC
  Administered 2022-06-07: 10 mg via ORAL
  Filled 2022-06-06: qty 1

## 2022-06-06 MED ORDER — TAMSULOSIN HCL 0.4 MG PO CAPS
0.4000 mg | ORAL_CAPSULE | Freq: Every day | ORAL | Status: DC
  Administered 2022-06-07: 0.4 mg via ORAL
  Filled 2022-06-06: qty 1

## 2022-06-06 MED ORDER — GABAPENTIN 300 MG PO CAPS
ORAL_CAPSULE | ORAL | Status: AC
  Administered 2022-06-06: 300 mg via ORAL
  Filled 2022-06-06: qty 1

## 2022-06-06 MED ORDER — EPHEDRINE 5 MG/ML INJ
INTRAVENOUS | Status: AC
  Filled 2022-06-06: qty 10

## 2022-06-06 MED ORDER — DROPERIDOL 2.5 MG/ML IJ SOLN: 0.6250 mg | Freq: Once | INTRAMUSCULAR | Status: DC | PRN

## 2022-06-06 MED ORDER — DEXAMETHASONE SODIUM PHOSPHATE 10 MG/ML IJ SOLN
INTRAMUSCULAR | Status: DC | PRN
  Administered 2022-06-06: 5 mg via INTRAVENOUS

## 2022-06-06 MED ORDER — TRANEXAMIC ACID-NACL 1000-0.7 MG/100ML-% IV SOLN: 1000.0000 mg | Freq: Once | INTRAVENOUS | Status: AC

## 2022-06-06 MED ORDER — TRAMADOL HCL 50 MG PO TABS
50.0000 mg | ORAL_TABLET | ORAL | Status: DC | PRN
  Administered 2022-06-07: 50 mg via ORAL
  Filled 2022-06-06: qty 1

## 2022-06-06 MED ORDER — FENTANYL CITRATE (PF) 100 MCG/2ML IJ SOLN
25.0000 ug | INTRAMUSCULAR | Status: DC | PRN
  Administered 2022-06-06: 50 ug via INTRAVENOUS
  Administered 2022-06-06: 25 ug via INTRAVENOUS

## 2022-06-06 MED ORDER — OXYCODONE HCL 5 MG PO TABS
5.0000 mg | ORAL_TABLET | Freq: Once | ORAL | Status: AC | PRN
  Administered 2022-06-06: 5 mg via ORAL

## 2022-06-06 MED ORDER — METOCLOPRAMIDE HCL 5 MG PO TABS
10.0000 mg | ORAL_TABLET | Freq: Three times a day (TID) | ORAL | Status: DC
  Administered 2022-06-07 (×2): 10 mg via ORAL
  Filled 2022-06-06 (×2): qty 2

## 2022-06-06 MED ORDER — CHLORHEXIDINE GLUCONATE 4 % EX LIQD: 60.0000 mL | Freq: Once | CUTANEOUS | Status: DC

## 2022-06-06 MED ORDER — CEFAZOLIN SODIUM-DEXTROSE 2-4 GM/100ML-% IV SOLN
2.0000 g | INTRAVENOUS | Status: AC
  Administered 2022-06-06 (×2): 2 g via INTRAVENOUS

## 2022-06-06 MED ORDER — OXYCODONE HCL 5 MG PO TABS
5.0000 mg | ORAL_TABLET | ORAL | Status: DC | PRN
  Administered 2022-06-07: 5 mg via ORAL
  Filled 2022-06-06: qty 1

## 2022-06-06 MED ORDER — GENTAMICIN SULFATE 40 MG/ML IJ SOLN
INTRAMUSCULAR | Status: DC | PRN
  Administered 2022-06-06: 3 mL via SURGICAL_CAVITY

## 2022-06-06 MED ORDER — ACETAMINOPHEN 10 MG/ML IV SOLN
INTRAVENOUS | Status: DC | PRN
  Administered 2022-06-06: 1000 mg via INTRAVENOUS

## 2022-06-06 MED ORDER — CELECOXIB 200 MG PO CAPS: 200.0000 mg | ORAL_CAPSULE | Freq: Two times a day (BID) | ORAL | Status: DC

## 2022-06-06 MED ORDER — FENTANYL CITRATE (PF) 100 MCG/2ML IJ SOLN
INTRAMUSCULAR | Status: DC | PRN
Start: 2022-06-06 — End: 2022-06-06
  Administered 2022-06-06: 50 ug via INTRAVENOUS
  Administered 2022-06-06 (×2): 25 ug via INTRAVENOUS

## 2022-06-06 MED ORDER — SODIUM CHLORIDE 0.9 % IR SOLN
Status: DC | PRN
  Administered 2022-06-06 (×2): 3000 mL

## 2022-06-06 MED ORDER — PANTOPRAZOLE SODIUM 40 MG PO TBEC
40.0000 mg | DELAYED_RELEASE_TABLET | Freq: Two times a day (BID) | ORAL | Status: DC
  Administered 2022-06-06 – 2022-06-07 (×2): 40 mg via ORAL
  Filled 2022-06-06 (×2): qty 1

## 2022-06-06 MED ORDER — MENTHOL 3 MG MT LOZG: 1.0000 | LOZENGE | OROMUCOSAL | Status: DC | PRN

## 2022-06-06 MED ORDER — ASPIRIN 81 MG PO CHEW
81.0000 mg | CHEWABLE_TABLET | Freq: Two times a day (BID) | ORAL | Status: DC
  Administered 2022-06-06 – 2022-06-07 (×2): 81 mg via ORAL
  Filled 2022-06-06 (×2): qty 1

## 2022-06-06 MED ORDER — SUGAMMADEX SODIUM 200 MG/2ML IV SOLN
INTRAVENOUS | Status: DC | PRN
  Administered 2022-06-06: 200 mg via INTRAVENOUS

## 2022-06-06 MED ORDER — ENSURE PRE-SURGERY PO LIQD: 296.0000 mL | Freq: Once | ORAL | Status: DC

## 2022-06-06 MED ORDER — ACETAMINOPHEN 500 MG PO TABS: 500.0000 mg | ORAL_TABLET | Freq: Every evening | ORAL | Status: DC | PRN

## 2022-06-06 MED ORDER — CEFAZOLIN SODIUM-DEXTROSE 2-4 GM/100ML-% IV SOLN
INTRAVENOUS | Status: AC
  Filled 2022-06-06: qty 100

## 2022-06-06 MED ORDER — PHENYLEPHRINE 80 MCG/ML (10ML) SYRINGE FOR IV PUSH (FOR BLOOD PRESSURE SUPPORT)
PREFILLED_SYRINGE | INTRAVENOUS | Status: DC | PRN
  Administered 2022-06-06: 160 ug via INTRAVENOUS
  Administered 2022-06-06 (×2): 80 ug via INTRAVENOUS
  Administered 2022-06-06: 60 ug via INTRAVENOUS
  Administered 2022-06-06: 80 ug via INTRAVENOUS

## 2022-06-06 MED ORDER — ACETAMINOPHEN 10 MG/ML IV SOLN: 1000.0000 mg | Freq: Once | INTRAVENOUS | Status: DC | PRN

## 2022-06-06 MED ORDER — ONDANSETRON HCL 4 MG/2ML IJ SOLN: 4.0000 mg | Freq: Four times a day (QID) | INTRAMUSCULAR | Status: DC | PRN

## 2022-06-06 MED ORDER — DIPHENHYDRAMINE HCL 12.5 MG/5ML PO ELIX: 12.5000 mg | ORAL_SOLUTION | ORAL | Status: DC | PRN

## 2022-06-06 MED ORDER — DIPHENHYDRAMINE HCL 25 MG PO CAPS
25.0000 mg | ORAL_CAPSULE | Freq: Every evening | ORAL | Status: DC | PRN
  Administered 2022-06-06: 25 mg via ORAL
  Filled 2022-06-06: qty 1

## 2022-06-06 MED ORDER — PROMETHAZINE HCL 25 MG/ML IJ SOLN: 6.2500 mg | INTRAMUSCULAR | Status: DC | PRN

## 2022-06-06 MED ORDER — ACETAMINOPHEN 10 MG/ML IV SOLN
INTRAVENOUS | Status: AC
  Filled 2022-06-06: qty 100

## 2022-06-06 MED ORDER — CELECOXIB 200 MG PO CAPS
200.0000 mg | ORAL_CAPSULE | Freq: Two times a day (BID) | ORAL | Status: DC
  Administered 2022-06-06 – 2022-06-07 (×2): 200 mg via ORAL
  Filled 2022-06-06 (×2): qty 1

## 2022-06-06 MED ORDER — FERROUS SULFATE 325 (65 FE) MG PO TABS
325.0000 mg | ORAL_TABLET | Freq: Two times a day (BID) | ORAL | Status: DC
  Administered 2022-06-07: 325 mg via ORAL
  Filled 2022-06-06: qty 1

## 2022-06-06 MED ORDER — HYDROCHLOROTHIAZIDE 25 MG PO TABS
25.0000 mg | ORAL_TABLET | Freq: Every day | ORAL | Status: DC
  Administered 2022-06-07: 25 mg via ORAL
  Filled 2022-06-06: qty 1

## 2022-06-06 MED ORDER — PHENOL 1.4 % MT LIQD: 1.0000 | OROMUCOSAL | Status: DC | PRN

## 2022-06-06 MED ORDER — CHLORHEXIDINE GLUCONATE 0.12 % MT SOLN
OROMUCOSAL | Status: AC
  Administered 2022-06-06: 15 mL via OROMUCOSAL
  Filled 2022-06-06: qty 15

## 2022-06-06 MED ORDER — FLEET ENEMA 7-19 GM/118ML RE ENEM: 1.0000 | ENEMA | Freq: Once | RECTAL | Status: DC | PRN

## 2022-06-06 MED ORDER — PHENYLEPHRINE 80 MCG/ML (10ML) SYRINGE FOR IV PUSH (FOR BLOOD PRESSURE SUPPORT)
PREFILLED_SYRINGE | INTRAVENOUS | Status: AC
  Filled 2022-06-06: qty 10

## 2022-06-06 MED ORDER — GABAPENTIN 300 MG PO CAPS: 300.0000 mg | ORAL_CAPSULE | Freq: Once | ORAL | Status: AC

## 2022-06-06 MED ORDER — ONDANSETRON HCL 4 MG PO TABS: 4.0000 mg | ORAL_TABLET | Freq: Four times a day (QID) | ORAL | Status: DC | PRN

## 2022-06-06 MED ORDER — CHLORHEXIDINE GLUCONATE 0.12 % MT SOLN: 15.0000 mL | Freq: Once | OROMUCOSAL | Status: AC

## 2022-06-06 MED ORDER — VANCOMYCIN HCL 1000 MG IV SOLR
INTRAVENOUS | Status: AC
  Filled 2022-06-06: qty 20

## 2022-06-06 MED ORDER — GENTAMICIN SULFATE 40 MG/ML IJ SOLN
INTRAMUSCULAR | Status: AC
  Filled 2022-06-06: qty 6

## 2022-06-06 MED ORDER — DULOXETINE HCL 30 MG PO CPEP
120.0000 mg | ORAL_CAPSULE | Freq: Every day | ORAL | Status: DC
  Administered 2022-06-07: 120 mg via ORAL
  Filled 2022-06-06: qty 4

## 2022-06-06 MED ORDER — ALUM & MAG HYDROXIDE-SIMETH 200-200-20 MG/5ML PO SUSP: 30.0000 mL | ORAL | Status: DC | PRN

## 2022-06-06 MED ORDER — TRANEXAMIC ACID-NACL 1000-0.7 MG/100ML-% IV SOLN
1000.0000 mg | INTRAVENOUS | Status: AC
  Administered 2022-06-06: 1000 mg via INTRAVENOUS

## 2022-06-06 MED ORDER — ACETAMINOPHEN 325 MG PO TABS: 325.0000 mg | ORAL_TABLET | Freq: Four times a day (QID) | ORAL | Status: DC | PRN

## 2022-06-06 MED ORDER — HYDROMORPHONE HCL 1 MG/ML IJ SOLN: 0.5000 mg | INTRAMUSCULAR | Status: DC | PRN

## 2022-06-06 MED ORDER — OXYCODONE HCL 5 MG PO TABS
ORAL_TABLET | ORAL | Status: AC
  Filled 2022-06-06: qty 1

## 2022-06-06 MED ORDER — LIDOCAINE HCL (CARDIAC) PF 100 MG/5ML IV SOSY
PREFILLED_SYRINGE | INTRAVENOUS | Status: DC | PRN
  Administered 2022-06-06: 80 mg via INTRAVENOUS

## 2022-06-06 MED ORDER — LACTATED RINGERS IV SOLN: INTRAVENOUS | Status: DC

## 2022-06-06 MED ORDER — ORAL CARE MOUTH RINSE: 15.0000 mL | Freq: Once | OROMUCOSAL | Status: AC

## 2022-06-06 MED ORDER — ROCURONIUM BROMIDE 100 MG/10ML IV SOLN
INTRAVENOUS | Status: DC | PRN
  Administered 2022-06-06: 10 mg via INTRAVENOUS
  Administered 2022-06-06: 60 mg via INTRAVENOUS
  Administered 2022-06-06: 20 mg via INTRAVENOUS
  Administered 2022-06-06: 10 mg via INTRAVENOUS

## 2022-06-06 MED ORDER — PHENYLEPHRINE HCL-NACL 20-0.9 MG/250ML-% IV SOLN
INTRAVENOUS | Status: DC | PRN
  Administered 2022-06-06: 40 ug/min via INTRAVENOUS

## 2022-06-06 MED ORDER — BISACODYL 10 MG RE SUPP: 10.0000 mg | Freq: Every day | RECTAL | Status: DC | PRN

## 2022-06-06 MED ORDER — CEFAZOLIN SODIUM-DEXTROSE 2-4 GM/100ML-% IV SOLN
2.0000 g | Freq: Four times a day (QID) | INTRAVENOUS | Status: AC
  Administered 2022-06-07 (×2): 2 g via INTRAVENOUS
  Filled 2022-06-06 (×2): qty 100

## 2022-06-06 MED ORDER — PROPOFOL 10 MG/ML IV BOLUS
INTRAVENOUS | Status: AC
  Filled 2022-06-06: qty 20

## 2022-06-06 MED ORDER — AMLODIPINE BESYLATE 10 MG PO TABS
10.0000 mg | ORAL_TABLET | Freq: Every day | ORAL | Status: DC
  Administered 2022-06-07: 10 mg via ORAL
  Filled 2022-06-06: qty 1

## 2022-06-06 MED ORDER — DIPHENHYDRAMINE-APAP (SLEEP) 25-500 MG PO TABS: 2.0000 | ORAL_TABLET | Freq: Every day | ORAL | Status: DC

## 2022-06-06 MED ORDER — TRANEXAMIC ACID-NACL 1000-0.7 MG/100ML-% IV SOLN
INTRAVENOUS | Status: AC
  Administered 2022-06-06: 1000 mg via INTRAVENOUS
  Filled 2022-06-06: qty 100

## 2022-06-06 MED ORDER — CARBAMAZEPINE 200 MG PO TABS
400.0000 mg | ORAL_TABLET | Freq: Three times a day (TID) | ORAL | Status: DC
  Administered 2022-06-06 – 2022-06-07 (×2): 400 mg via ORAL
  Filled 2022-06-06 (×2): qty 2

## 2022-06-06 MED ORDER — FENTANYL CITRATE (PF) 100 MCG/2ML IJ SOLN
INTRAMUSCULAR | Status: AC
  Filled 2022-06-06: qty 2

## 2022-06-06 SURGICAL SUPPLY — 79 items
BIT DRILL Q/COUPLING 1 (BIT) IMPLANT
CANISTER PREVENA PLUS 150 (CANNISTER) IMPLANT
CANISTER WOUND CARE 500ML ATS (WOUND CARE) IMPLANT
CNTNR SPEC 2.5X3XGRAD LEK (MISCELLANEOUS) ×1
CONT SPEC 4OZ STER OR WHT (MISCELLANEOUS) ×1
CONT SPEC 4OZ STRL OR WHT (MISCELLANEOUS) ×1
CONTAINER SPEC 2.5X3XGRAD LEK (MISCELLANEOUS) IMPLANT
COVER BACK TABLE REUSABLE LG (DRAPES) ×1 IMPLANT
DRAPE 3/4 80X56 (DRAPES) ×2 IMPLANT
DRAPE INCISE IOBAN 66X60 STRL (DRAPES) ×1 IMPLANT
DRESSING PEEL AND PLAC PRVNA20 (GAUZE/BANDAGES/DRESSINGS) IMPLANT
DRSG DERMACEA NONADH 3X8 (GAUZE/BANDAGES/DRESSINGS) ×1 IMPLANT
DRSG MEPILEX SACRM 8.7X9.8 (GAUZE/BANDAGES/DRESSINGS) ×1 IMPLANT
DRSG OPSITE POSTOP 4X14 (GAUZE/BANDAGES/DRESSINGS) ×1 IMPLANT
DRSG PEEL AND PLACE PREVENA 20 (GAUZE/BANDAGES/DRESSINGS) ×1
DRSG TEGADERM 4X4.75 (GAUZE/BANDAGES/DRESSINGS) IMPLANT
DURAPREP 26ML APPLICATOR (WOUND CARE) ×1 IMPLANT
ELECT CAUTERY BLADE 6.4 (BLADE) ×1 IMPLANT
GAUZE 4X4 16PLY ~~LOC~~+RFID DBL (SPONGE) ×1 IMPLANT
GAUZE SPONGE 4X4 12PLY STRL (GAUZE/BANDAGES/DRESSINGS) ×1 IMPLANT
GLOVE BIO SURGEON STRL SZ7.5 (GLOVE) ×2 IMPLANT
GLOVE BIOGEL M STRL SZ7.5 (GLOVE) ×2 IMPLANT
GLOVE BIOGEL PI ORTHO PRO 7.5 (GLOVE) ×4
GLOVE PI ORTHO PRO STRL 7.5 (GLOVE) ×2 IMPLANT
GLOVE SURG UNDER LTX SZ8 (GLOVE) ×1 IMPLANT
GLOVE SURG UNDER POLY LF SZ7.5 (GLOVE) ×1 IMPLANT
GOWN STRL REUS W/ TWL LRG LVL3 (GOWN DISPOSABLE) ×2 IMPLANT
GOWN STRL REUS W/ TWL XL LVL3 (GOWN DISPOSABLE) ×1 IMPLANT
GOWN STRL REUS W/TWL LRG LVL3 (GOWN DISPOSABLE) ×2
GOWN STRL REUS W/TWL XL LVL3 (GOWN DISPOSABLE) ×1
HANDPIECE VERSAJET DEBRIDEMENT (MISCELLANEOUS) IMPLANT
HEAD M SROM 36MM PLUS 1.5 (Hips) IMPLANT
HEMOVAC 400CC 10FR (MISCELLANEOUS) ×1 IMPLANT
HOLDER FOLEY CATH W/STRAP (MISCELLANEOUS) ×1 IMPLANT
HOLSTER ELECTROSUGICAL PENCIL (MISCELLANEOUS) ×1 IMPLANT
HOOD PEEL AWAY FLYTE STAYCOOL (MISCELLANEOUS) ×2 IMPLANT
IRRIGATION STRYKERFLOW (MISCELLANEOUS) ×1 IMPLANT
IRRIGATOR STRYKERFLOW (MISCELLANEOUS) ×1
IV NS 1000ML (IV SOLUTION) ×3
IV NS 1000ML BAXH (IV SOLUTION) IMPLANT
IV NS 100ML SINGLE PACK (IV SOLUTION) ×1 IMPLANT
IV NS IRRIG 3000ML ARTHROMATIC (IV SOLUTION) ×1 IMPLANT
IV SODIUM CHL 0.9% 500ML (IV SOLUTION) IMPLANT
KIT PEG BOARD PINK (KITS) ×1 IMPLANT
KIT STIMULAN RAPID CURE 5CC (Orthopedic Implant) IMPLANT
MANIFOLD NEPTUNE II (INSTRUMENTS) ×2 IMPLANT
NDL FILTER BLUNT 18X1 1/2 (NEEDLE) ×1 IMPLANT
NDL SAFETY ECLIP 18X1.5 (MISCELLANEOUS) ×1 IMPLANT
NEEDLE FILTER BLUNT 18X 1/2SAF (NEEDLE)
NEEDLE FILTER BLUNT 18X1 1/2 (NEEDLE) IMPLANT
NS IRRIG 1000ML POUR BTL (IV SOLUTION) ×1 IMPLANT
PACK HIP PROSTHESIS (MISCELLANEOUS) ×1 IMPLANT
PENCIL SMOKE EVACUATOR COATED (MISCELLANEOUS) ×1 IMPLANT
PULSAVAC PLUS IRRIG FAN TIP (DISPOSABLE) ×1
SOLUTION IRRIG SURGIPHOR (IV SOLUTION) IMPLANT
SPONGE DRAIN TRACH 4X4 STRL 2S (GAUZE/BANDAGES/DRESSINGS) IMPLANT
SPONGE T-LAP 18X18 ~~LOC~~+RFID (SPONGE) ×4 IMPLANT
SROM M HEAD 36MM PLUS 1.5 (Hips) ×1 IMPLANT
STAPLER SKIN PROX 35W (STAPLE) ×1 IMPLANT
STRAP SAFETY 5IN WIDE (MISCELLANEOUS) ×1 IMPLANT
SUCTION FRAZIER HANDLE 10FR (MISCELLANEOUS) ×1
SUCTION TUBE FRAZIER 10FR DISP (MISCELLANEOUS) ×1 IMPLANT
SUT ETHIBOND #5 BRAIDED 30INL (SUTURE) ×1 IMPLANT
SUT VIC AB 0 CT1 36 (SUTURE) ×1 IMPLANT
SUT VIC AB 1 CT1 36 (SUTURE) ×2 IMPLANT
SUT VIC AB 2-0 CT1 27 (SUTURE) ×1
SUT VIC AB 2-0 CT1 TAPERPNT 27 (SUTURE) ×1 IMPLANT
SWAB CULTURE AMIES ANAERIB BLU (MISCELLANEOUS) IMPLANT
SYR 20ML LL LF (SYRINGE) ×1 IMPLANT
TAPE CLOTH 3X10 WHT NS LF (GAUZE/BANDAGES/DRESSINGS) ×1 IMPLANT
TAPE TRANSPORE STRL 2 31045 (GAUZE/BANDAGES/DRESSINGS) ×1 IMPLANT
TIP BRUSH PULSAVAC PLUS 24.33 (MISCELLANEOUS) IMPLANT
TIP FAN IRRIG PULSAVAC PLUS (DISPOSABLE) ×1 IMPLANT
TOWEL OR 17X26 4PK STRL BLUE (TOWEL DISPOSABLE) ×1 IMPLANT
TRAP FLUID SMOKE EVACUATOR (MISCELLANEOUS) ×1 IMPLANT
TRAY FOLEY MTR SLVR 16FR STAT (SET/KITS/TRAYS/PACK) ×1 IMPLANT
TUBE KAMVAC SUCTION (TUBING) ×1 IMPLANT
WATER STERILE IRR 1000ML POUR (IV SOLUTION) IMPLANT
WATER STERILE IRR 500ML POUR (IV SOLUTION) ×1 IMPLANT

## 2022-06-06 NOTE — Anesthesia Procedure Notes (Signed)
Procedure Name: Intubation Date/Time: 06/06/2022 5:41 PM  Performed by: Loletha Grayer, CRNAPre-anesthesia Checklist: Patient identified, Patient being monitored, Timeout performed, Emergency Drugs available and Suction available Patient Re-evaluated:Patient Re-evaluated prior to induction Oxygen Delivery Method: Circle system utilized Preoxygenation: Pre-oxygenation with 100% oxygen Induction Type: IV induction Ventilation: Mask ventilation without difficulty and Oral airway inserted - appropriate to patient size Laryngoscope Size: McGraph and 4 Grade View: Grade I Tube type: Oral Tube size: 7.5 mm Number of attempts: 1 Airway Equipment and Method: Stylet Placement Confirmation: ETT inserted through vocal cords under direct vision, positive ETCO2 and breath sounds checked- equal and bilateral Secured at: 22 cm Tube secured with: Tape Dental Injury: Teeth and Oropharynx as per pre-operative assessment

## 2022-06-06 NOTE — Interval H&P Note (Signed)
History and Physical Interval Note:  06/06/2022 4:59 PM  Bryan Frey  has presented today for surgery, with the diagnosis of Status post revision of total hip replacement, Open wound.  The various methods of treatment have been discussed with the patient and family. After consideration of risks, benefits and other options for treatment, the patient has consented to  Procedure(s): IRRIGATION AND DEBRIDEMENT OF LEFT HIP WITH POSSIBLE POLYETHYLENE EXCHANGE. (Left) as a surgical intervention.  The patient's history has been reviewed, patient examined, no change in status, stable for surgery.  I have reviewed the patient's chart and labs.  Questions were answered to the patient's satisfaction.     [place]

## 2022-06-06 NOTE — Op Note (Signed)
OPERATIVE NOTE  DATE OF SURGERY:  06/06/2022  PATIENT NAME:  Bryan Frey   DOB: [birth year]  MRN: [id number]  PRE-OPERATIVE DIAGNOSIS: Draining wound, status post left total hip revision arthroplasty  POST-OPERATIVE DIAGNOSIS:  Same  PROCEDURE: Irrigation and debridement of left hip with placement of Stimulan antibiotic impregnated beads  SURGEON:  Dereck Leep, Jr. M.D.  ASSISTANT:  Cassell Smiles, PA-C (present and scrubbed throughout the case, critical for assistance with exposure, retraction, instrumentation, and closure)  ANESTHESIA: general  ESTIMATED BLOOD LOSS: 150 mL  FLUIDS REPLACED: 1500 mL of crystalloid  DRAINS: 2 medium Hemovac drains, Prevena wound VAC  IMPLANTS UTILIZED: DePuy 36 mm M-SPEC +1.5 mm hip ball  INDICATIONS FOR SURGERY: Bryan Frey is a 76 y.o. year old male who underwent left hip revision arthroplasty over 2 years ago.  He had done well until the last several days when he had the onset of slight swelling and a draining wound to the midportion of his surgical incision.  He denied any fevers or chills.  He denied any injury.  After discussion of the risks and benefits of surgical intervention, the patient expressed understanding of the risks benefits and agree with plans for irrigation and debridement of the left hip.   The risks, benefits, and alternatives were discussed at length including but not limited to the risks of infection, bleeding, nerve injury, stiffness, blood clots, the need for revision surgery, limb length inequality, dislocation, cardiopulmonary complications, among others, and they were willing to proceed.  PROCEDURE IN DETAIL: The patient was brought into the operating room and, after adequate general anesthesia was achieved, the patient was placed in a right lateral decubitus position. Axillary roll was placed and all bony prominences were well-padded. The patient's left hip was cleaned and prepped with alcohol and DuraPrep and  draped in the usual sterile fashion. A "timeout" was performed as per usual protocol. A lateral curvilinear incision was made in line with the previous surgical incision curving towards the posterior superior iliac spine.  The draining sinus was excised in elliptical form and noted to be relatively contained within the subcutaneous tissue.  The IT band was incised in line with the skin incision and the fibers of the gluteus maximus were split in line.  Swabs were obtained from under the IT band and submitted for Gram stain and cultures.  A T type posterior capsulotomy was performed and the pseudocapsule.  Extensive fibrotic tissue was encountered somewhat obscuring the typical tissue planes.  Much of the fibrotic tissue was excised using electrocautery and rongeurs.  The femoral head was disengaged from the neck of the implant so this allowed better visualization.  Additional swabs were obtained from under the gluteus medius as well as from the joint and submitted for Gram stain and cultures.  Tissue was then debrided using the Versajet, utilizing 3 L of normal saline.  The Gram stains were noted to have no organisms seen and 0-5 white cells per high-powered field.  It was thus elected to continue with irrigation of the wound with copious amounts of normal saline using pulsatile lavage.  The Morse taper was cleaned and dried. A 36 mm M-SPEC hip ball with a +1.5 mm neck length was placed on the trunnion and impacted into place. The hip was then reduced and placed through range of motion. Excellent stability was appreciated both anteriorly and posteriorly.  The wound was irrigated with copious amounts of normal saline followed by 450 ml of Surgiphor  and suctioned dry. Good hemostasis was appreciated.  10 cc of Stimulan powder were mixed with 1000 mg of vancomycin and 240 mg of gentamicin supposed to form antibiotic impregnated beads.  These were placed in the wound bed.  2 medium Hemovac drains were placed wound bed  and brought through separate stab incision.  The IT band was reapproximated using interrupted sutures of #1 Vicryl. Subcutaneous tissue was approximated using first #0 Vicryl followed by #2-0 Vicryl. The skin was closed with skin staples.  A Prevena wound VAC was applied.  The patient tolerated the procedure well and was transported to the recovery room in stable condition.   Bryan Frey., M.D.

## 2022-06-06 NOTE — Transfer of Care (Signed)
Immediate Anesthesia Transfer of Care Note  Patient: Bryan Frey  Procedure(s) Performed: IRRIGATION AND DEBRIDEMENT OF LEFT HIP WITH FEMORAL HEAD EXCHANGE (Left: Hip)  Patient Location: PACU  Anesthesia Type:General  Level of Consciousness: awake and alert   Airway & Oxygen Therapy: Patient Spontanous Breathing and Patient connected to face mask oxygen  Post-op Assessment: Report given to RN and Post -op Vital signs reviewed and stable  Post vital signs: Reviewed  Last Vitals:  Vitals Value Taken Time  BP    Temp    Pulse 71 06/06/22 2111  Resp 12 06/06/22 2111  SpO2 100 % 06/06/22 2111  Vitals shown include unvalidated device data.  Last Pain:  Vitals:   06/06/22 1519  TempSrc: Temporal  PainSc: 0-No pain         Complications: No notable events documented.

## 2022-06-06 NOTE — H&P (Signed)
ORTHOPAEDIC HISTORY & PHYSICAL Joseph Bias, Florinda Marker., MD - 06/05/2022 12:15 PM EDT Formatting of this note is different from the original. Images from the original note were not included. Chief Complaint: Chief Complaint  Patient presents with  Follow-up  Left total hip revision 04/06/20, incision drainage   Reason for Visit: The patient is a 76 y.o. male who presents today with his wife for reevaluation of his left hip. The patient is over 2 years status post left hip revision arthroplasty (acetabular component). He had done well until last week when he noted the onset of some mild swelling and erythema to the midportion of the surgical incision. 4 days ago he noted the onset of some drainage from the midportion of the incision that has persisted. He denies any significant pain to the left hip or thigh. He denied any fevers or chills. He denied any other constitutional symptoms. He does not recall any trauma or injury to the site. Swabs were obtained for Gram stain and cultures, although results from [place] are not available.   Medications: Current Outpatient Medications  Medication Sig Dispense Refill  acetaminophen (TYLENOL) 500 MG tablet Take 1,000 mg by mouth nightly as needed  amLODIPine (NORVASC) 10 MG tablet TAKE 1 TABLET BY MOUTH EVERY DAY 90 tablet 3  betamethasone dipropionate (DIPROSONE) 0.05 % cream APPLY TOPICALLY TWICE A DAY 45 g 2  cyanocobalamin (VITAMIN B12) 1000 MCG tablet Take 1,000 mcg by mouth once daily  diphenhydrAMINE-acetaminophen (TYLENOL PM) 25-500 mg per tablet Take 1 tablet by mouth nightly as needed  DULoxetine (CYMBALTA) 60 MG DR capsule Take 2 capsules (120 mg total) by mouth once daily 180 capsule 3  hydroCHLOROthiazide (HYDRODIURIL) 25 MG tablet Take 1 tablet (25 mg total) by mouth once daily 90 tablet 3  omeprazole (PRILOSEC) 40 MG DR capsule TAKE 1 CAPSULE BY MOUTH AT BEDTIME. 30 capsule 1  tamsulosin (FLOMAX) 0.4 mg capsule Take 1 capsule (0.4 mg  total) by mouth once daily Take 30 minutes after same meal each day. 90 capsule 3  traMADoL (ULTRAM) 50 mg tablet Take 2 tablets (100 mg total) by mouth once daily as needed for Pain 14 tablet 0  carBAMazepine (TEGRETOL) 200 mg tablet Take 2 tablets (400 mg total) by mouth 3 (three) times daily for 90 days 540 tablet 3   No current facility-administered medications for this visit.   Allergies: Allergies  Allergen Reactions  Morphine Hallucination  Statins-Hmg-Coa Reductase Inhibitors Muscle Pain   Past Medical History: Past Medical History:  Diagnosis Date  Chickenpox  Erectile dysfunction  History of colonoscopy  Hyperlipidemia  Hypertension  Internuclear ophthalmoplegia  Kidney stones  Leg length discrepancy  S/P myelogram 05/20/2016  Dr Ellene Route  Spinal stenosis of lumbar region  Trigeminal neuralgia 10/13/2016  Controlled with carbamazepine   Past Surgical History: Past Surgical History:  Procedure Laterality Date  Left total hip arthroplasty 08/07/2010  Right total hip arthroplasty 07/10/2014  Revision of left acetabular component 05/14/2015  Dr.Paelyn Smick  ANTERIOR FUSION LUMBAR SPINE 2017  L2-5  Left hip revision arthroplasty (acetabular component) 04/06/2020  Dr Marry Guan  COLONOSCOPY 12/03/2020  Tubular adenomas/Hyperplastic polyps/Repeat 90yr/TKT  BACK SURGERY  LUMBOSACRAL SPINE SURGERY   Social History: Social History   Socioeconomic History  Marital status: Married  Spouse name: THelene Kelp Number of children: 2  Years of education: 16  Highest education level: Bachelor's degree (e.g., BA, AB, BS)  Occupational History  Occupation: Retired-Tree surgeon Tobacco Use  Smoking status: Never  Smokeless tobacco: Never  Vaping Use  Vaping Use: Never used  Substance and Sexual Activity  Alcohol use: No  Alcohol/week: 0.0 standard drinks  Drug use: No  Sexual activity: Yes  Partners: Female  Social History Narrative  Lives in [place]. Married. He is active in  church. Denies smoking and alcohol intake.   Family History: Family History  Problem Relation Age of Onset  Diabetes Father  Diabetes Sister  Arthritis Mother  Myopathy Neg Hx   Review of Systems: A comprehensive 14 point ROS was performed, reviewed, and the pertinent orthopaedic findings are documented in the HPI.  Exam BP 132/74  Temp 36.7 C (98.1 F)  Ht 180.3 cm ('5\' 11"'$ )  Wt 97.9 kg (215 lb 12.8 oz)  BMI 30.10 kg/m   General:  Well-developed, well-nourished male seen in no acute distress. Unsteady gait without evidence of significant abductor lurch. He is using a crutch for ambulation.  HEENT:  Atraumatic, normocephalic. Pupils are equal and reactive to light. Extraocular motion is intact. Sclera are clear. Oropharynx is clear with moist mucosa.  Neck:  Supple, nontender, and with good ROM. No thyromegaly, adenopathy, JVD, or carotid bruits.  Lungs:  Clear to auscultation bilaterally.  Cardiovascular:  Regular rate and rhythm. Normal S1, S2. No murmur . No appreciable gallops or rubs. Peripheral pulses are palpable. No lower extremity edema. Homan`s test is negative.  Abdomen:  Soft, nontender, nondistended. Bowel sounds are present.  Extremities: Good strength, stability, and range of motion of the upper extremities. Good range of motion of the knees and ankles.  Left Hip: Pelvic tilt: Negative Limb lengths: Equal with the patient standing Soft tissue swelling: Negative Erythema: Minimal Crepitance: Negative Tenderness: Greater trochanter is nontender to palpation. No pain is elicited by axial compression or extremes of rotation. Atrophy: No atrophy. Good hip flexor and abductor strength. Range of Motion: Good active and passive range of motion of the left hip is appreciated. There is an area of dehiscence to the midportion of the surgical scar with some purulent appearing drainage. There is no palpable fluctuance to the thigh.  Vascular: Peripheral  pulses are palpable. Good capillary refill. No gross pretibial or ankle edema. Homans test is negative.  Neurologic:  Awake, alert, and oriented.  Sensory function is intact to pinprick and light touch.  Motor strength is judged to be 5/5.  Motor coordination is grossly within normal limits.  No apparent clonus. No tremor.   X-rays: I reviewed the left hip radiographs that were performed at [facility] on 06/03/2022. The total hip implants appear to be in good position. No evidence of loosening or wear. No significant heterotopic ossification. No evidence of fracture or dislocation. There does not appear to be any subcutaneous gas or evidence of underlying osteomyelitis.   MRI of the left hip: I reviewed the left hip MRI performed at [facility] on 06/04/2022. I concur with the radiologist's interpretation as below:  MR OF THE LEFT HIP WITHOUT CONTRAST   TECHNIQUE:  Multiplanar, multisequence MR imaging was performed. No intravenous  contrast was administered.   COMPARISON: None Available.   FINDINGS:  Bones: Bilateral hip arthroplasties with associated susceptibility  artifact, obscuring the medial adjacent bony and soft tissues. There  is mild marrow edema at the junction of the posteroinferior  acetabulum and the ischial tuberosity. No distinct fracture line. No  focal bone lesion. Lumbar spine fusion hardware also with associated  susceptibility artifact. The sacroiliac joints are unremarkable.  Mild degenerative changes of the  symphysis pubis.   Articular cartilage and labrum   Articular cartilage: Prior hip arthroplasties.   Labrum: Prior hip arthroplasties.   Joint or bursal effusion   Joint effusion: Small left hip effusion. No right hip effusion.   Bursae: Questionable trochanteric bursal involvement by the fluid  collection described below.   Muscles and tendons   Muscles and tendons: The gluteal tendons are intact. The proximal   hamstrings are intact.Mild reactive edema within the right hip  adductors. Reactive edema within the left gluteus maximus.   Other findings   Miscellaneous: There is a fluid collection along the posterior  aspect of the left hip extending to the skin surface which measures  up to 2.4 cm short axis (series 4, image 26) and 10.1 cm in length  from the skin to the left hip (series 2, image 14). The collection  extends superiorly along the gluteus maximus and some along the  superficial surface of the proximal IT band.   IMPRESSION:  Fluid collection along the posterior aspect of the left hip  extending from the skin surface to the left hip joint, measuring up  to 2.4 cm short axis and 10.1 cm in length and with superior  extension along the gluteus maximus. Small left hip effusion.  Findings are concerning for infection, recommend diagnostic  aspiration.   Nonspecific mild marrow edema at the junction of the posteroinferior  acetabulum and the ischial tuberosity, could be reactive marrow  edema or early changes of osteomyelitis.   These results will be called to the ordering clinician or  representative by the Radiologist Assistant, and communication  documented in the PACS or [employer].   Electronically Signed  By: Maurine Simmering M.D.  On: 06/04/2022 13:01   Labs: Labs obtained on 06/02/2022 were reviewed. White count was within normal limits at 7.3. Differential was within normal limits with the exception of slightly increased percentage of eosinophils (6.1%). C-reactive protein was elevated at 27 and sedimentation rate was elevated at 38.  Impression: Status post left total hip revision arthroplasty Spontaneous dehiscence and drainage from the left hip, suspicious for infection  Plan:  The findings were discussed in detail with the patient and his wife. Given the clinical findings and results from the labs and MRI, the concern of an infection of the left hip was  discussed. I have recommended irrigation and debridement of the left hip with polyethylene exchange. Multiple intraoperative cultures will be obtained. We discussed the risks and benefits of surgical intervention. The usual perioperative course was also discussed in detail. The patient expressed understanding of the risks and benefits of surgical intervention and would like to proceed with plans for incision, irrigation, and debridement of the left hip with probable polyethylene exchange  I spent a total of 40 minutes in both face-to-face and non-face-to-face activities, excluding procedures performed, for this visit on the date of this encounter.  MEDICAL CLEARANCE: Per anesthesiology ACTIVITIES:  As tolerated. WORK STATUS: Not applicable. THERAPY: None MEDICATIONS: Requested Prescriptions   No prescriptions requested or ordered in this encounter   FOLLOW-UP: Return for postoperative follow-up.  Mehtab P. Holley Bouche., M.D.  This note was generated in part with voice recognition software and I apologize for any typographical errors that were not detected and corrected.  Electronically signed by Lamar Benes., MD at 06/05/2022 8:12 PM EDT

## 2022-06-06 NOTE — Anesthesia Preprocedure Evaluation (Addendum)
Anesthesia Evaluation  Patient identified by MRN, date of birth, ID band Patient awake    Reviewed: Allergy & Precautions, H&P , NPO status , Patient's Chart, lab work & pertinent test results  History of Anesthesia Complications Negative for: history of anesthetic complications  Airway Mallampati: III  TM Distance: >3 FB Neck ROM: full    Dental  (+) Teeth Intact   Pulmonary neg pulmonary ROS, neg sleep apnea, neg COPD, Patient abstained from smoking.Not current smoker,    Pulmonary exam normal breath sounds clear to auscultation       Cardiovascular METS: 3 - Mets hypertension, Pt. on medications (-) CAD and (-) Past MI Normal cardiovascular exam(-) dysrhythmias  Rhythm:Regular Rate:Normal     Neuro/Psych PSYCHIATRIC DISORDERS Anxiety Depression Spinal stenosis H/o lumbar fusion Trigeminal Neuralgia  Neuromuscular disease    GI/Hepatic negative GI ROS, Neg liver ROS, neg GERD  ,  Endo/Other  negative endocrine ROSneg diabetes  Renal/GU negative Renal ROSRenal carcinoma s/p removal  of tumor     Musculoskeletal  (+) Arthritis , S/P revision of total hip S/P total hip arthroplasty     Abdominal   Peds  Hematology  (+) Blood dyscrasia, anemia ,   Anesthesia Other Findings Past Medical History: No date: 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 under a house.no               problems after No date: Trigeminal neuralgia  Past Surgical History: No date: BACK SURGERY 12/24/2017: CATARACT EXTRACTION W/PHACO; Left     Comment:  Procedure: CATARACT EXTRACTION PHACO AND INTRAOCULAR               LENS PLACEMENT (IOC);  Surgeon: Eulogio Bear, MD;                Location: [facility];  Service: Ophthalmology;  Laterality:              Left;  Lot # [id number] H [place]: 00:30.4 AP%: 9.4 CDE: 2.84 01/26/2018: CATARACT EXTRACTION W/PHACO; Right     Comment:  Procedure: CATARACT EXTRACTION PHACO AND INTRAOCULAR               LENS PLACEMENT (IOC) RIGHT;  Surgeon: Eulogio Bear,              MD;  Location: [facility];  Service:               Ophthalmology;  Laterality: Right; No date: COLONOSCOPY No date: EYE SURGERY; Bilateral     Comment:  cataract extra tions 03/18/2017: IR RADIOLOGIST EVAL & MGMT No date: JOINT REPLACEMENT; Bilateral     Comment:  left hip replaced x 2, right hip x 1 08/07/2010: Left total hip arthroplasty 02/09/2012: LUMBAR LAMINECTOMY/DECOMPRESSION MICRODISCECTOMY     Comment:  Procedure: LUMBAR LAMINECTOMY/DECOMPRESSION               MICRODISCECTOMY 2 LEVELS;  Surgeon: Kristeen Miss, MD;                Location: [facility];  Service: Neurosurgery;                Laterality: Bilateral;  Bilateral Lumbar two-three,lumbar              four-five laminectomies 02/09/2014: NEPHROLITHOTOMY; Right     Comment:  Procedure: NEPHROLITHOTOMY PERCUTANEOUS;  Surgeon:               Franchot Gallo, MD;  Location: [facility];  Service:               Urology;  Laterality: Right; 07/13/2014: Right total hip arthroplasty 05/14/2015: TOTAL HIP REVISION; Left     Comment:  Procedure: TOTAL HIP REVISION;  Surgeon: Dereck Leep,               MD;  Location: [facility];  Service: Orthopedics;                Laterality: Left;  BMI    Body Mass Index: 32.86 kg/m      Reproductive/Obstetrics negative OB ROS                            Anesthesia Physical  Anesthesia Plan  ASA: II  Anesthesia Plan: General   Post-op Pain Management: Ofirmev IV (intra-op)*, Toradol IV (intra-op)* and Gabapentin PO (pre-op)*   Induction: Intravenous  PONV Risk Score and Plan: 2 and Ondansetron and Dexamethasone  Airway Management Planned: Oral ETT  Additional Equipment: None  Intra-op Plan:   Post-operative Plan:   Informed Consent: I have reviewed the patients History and Physical, chart, labs and discussed the procedure including the risks, benefits and alternatives for the proposed anesthesia with the patient or authorized representative who has indicated his/her understanding and acceptance.     Dental advisory given  Plan Discussed with: Anesthesiologist, CRNA and Surgeon  Anesthesia Plan Comments: (Discussed risks of anesthesia with patient, including possibility of difficulty with spontaneous ventilation under anesthesia necessitating airway intervention, PONV, and rare risks such as cardiac or respiratory or neurological events. Patient understands.)       Anesthesia Quick Evaluation

## 2022-06-07 MED ORDER — ASPIRIN 81 MG PO CHEW: 81.0000 mg | CHEWABLE_TABLET | Freq: Two times a day (BID) | ORAL | 0 refills | Status: AC

## 2022-06-07 MED ORDER — TRAMADOL HCL 50 MG PO TABS: 50.0000 mg | ORAL_TABLET | ORAL | 0 refills | Status: DC | PRN

## 2022-06-07 MED ORDER — OXYCODONE HCL 5 MG PO TABS: 5.0000 mg | ORAL_TABLET | ORAL | 0 refills | Status: DC | PRN

## 2022-06-07 MED ORDER — SENNOSIDES-DOCUSATE SODIUM 8.6-50 MG PO TABS
1.0000 | ORAL_TABLET | Freq: Two times a day (BID) | ORAL | 0 refills | Status: DC
Start: 2022-06-07 — End: 2022-06-27

## 2022-06-07 MED ORDER — SULFAMETHOXAZOLE-TRIMETHOPRIM 800-160 MG PO TABS: 1.0000 | ORAL_TABLET | Freq: Two times a day (BID) | ORAL | 0 refills | Status: AC

## 2022-06-07 NOTE — Evaluation (Addendum)
Physical Therapy Evaluation Patient Details Name: Bryan Frey MRN: [id number] DOB: [birth year] Today's Date: 06/07/2022  History of Present Illness  Pt is 61 YOM s/p L THA revision admitted for irrigation and debriedment and femoral head exchange. PMH includes: L THA, ED, HLD, HTN, internuclear opthalmoplegia, kidney stones, spinal stenosis of lumbar spine, and trigeminal neuralgia.  Clinical Impression  Pt presents to PT in bed and agreeable to participate in therapy services. Pt was pleasant and motivated to participate during the session and put forth good effort throughout. Pt able to amb well, no LOB and required minimal verbal cueing for sequencing and to maintain posterior hip precautions during L turns and transfers. Displays slightly impulsive behaviors. Able to perform stair negotiation w no LOB, pt verbalized and demonstrated understanding. Would benefit from skilled HHPT to address above deficits and return to baseline level of mobility to  promote optimal return to PLOF.       Recommendations for follow up therapy are one component of a multi-disciplinary discharge planning process, led by the attending physician.  Recommendations may be updated based on patient status, additional functional criteria and insurance authorization.  Follow Up Recommendations Home health PT      Assistance Recommended at Discharge Intermittent Supervision/Assistance  Patient can return home with the following  A little help with walking and/or transfers;Assistance with cooking/housework;Assist for transportation;A little help with bathing/dressing/bathroom;Help with stairs or ramp for entrance    Equipment Recommendations  None recommended by PT  Recommendations for Other Services       Functional Status Assessment Patient has had a recent decline in their functional status and demonstrates the ability to make significant improvements in function in a reasonable and predictable amount of time.      Precautions / Restrictions Precautions Precautions: Posterior Hip Precaution Booklet Issued: Yes (comment) Restrictions Weight Bearing Restrictions: Yes LLE Weight Bearing: Weight bearing as tolerated      Mobility  Bed Mobility Overal bed mobility: Modified Independent             General bed mobility comments: able to perform bed mobility while maintaining post hip precautions    Transfers Overall transfer level: Needs assistance Equipment used: Rolling walker (2 wheels) Transfers: Sit to/from Stand Sit to Stand: Min guard           General transfer comment: no physical assistance required, some cueing needed for sequencing    Ambulation/Gait Ambulation/Gait assistance: Min guard Distance: 150 ftx2, rest break during stair education   Assistive device: Rolling walker (2 wheels) Gait Pattern/deviations: Step-through pattern, Trunk flexed Gait velocity: decr     General Gait Details: utilized step through pattern, no LOB, CGA occasional cues for sequencing  Stairs Stairs: Yes Stairs assistance: Min guard Stair Management: One rail Right, Step to pattern, With cane Number of Stairs: 4 General stair comments: edu on "up w good/down w bad technique" pt verbalized and demonstrated understanding, no LOB  Wheelchair Mobility    Modified Rankin (Stroke Patients Only)       Balance Overall balance assessment: Needs assistance Sitting-balance support: No upper extremity supported, Feet supported Sitting balance-Leahy Scale: Good       Standing balance-Leahy Scale: Good                               Pertinent Vitals/Pain Pain Assessment Pain Assessment: 0-10 Pain Score: 1  Pain Location: L hip Pain Descriptors / Indicators: Aching, Discomfort Pain  Intervention(s): Monitored during session, Premedicated before session, Repositioned    Home Living Family/patient expects to be discharged to:: Private residence Living Arrangements:  Spouse/significant other;Children Available Help at Discharge: Family;Available 24 hours/day Type of Home: House Home Access: Stairs to enter Entrance Stairs-Rails: Bilateral, but unable to reach both Entrance Stairs-Number of Steps: 2   Home Layout: One level Home Equipment: Conservation officer, nature (2 wheels) (forearm support cane)      Prior Function Prior Level of Function : Independent/Modified Independent             Mobility Comments: full community amb w forearm cane ADLs Comments: (I) w ADLs/IADLs     Hand Dominance        Extremity/Trunk Assessment   Upper Extremity Assessment Upper Extremity Assessment: Overall WFL for tasks assessed    Lower Extremity Assessment Lower Extremity Assessment: LLE deficits/detail LLE Deficits / Details: impaired d/t surgical status    Cervical / Trunk Assessment Cervical / Trunk Assessment: Kyphotic  Communication   Communication: No difficulties  Cognition Arousal/Alertness: Awake/alert Behavior During Therapy: WFL for tasks assessed/performed Overall Cognitive Status: Within Functional Limits for tasks assessed                                          General Comments      Exercises Total Joint Exercises Ankle Circles/Pumps: AROM, 10 reps, Both, Supine Quad Sets: Strengthening, Both, 10 reps, Supine Gluteal Sets: Strengthening, Both, 10 reps, Supine Marching in Standing: Both, Strengthening, 10 reps, Standing Posterior hip precaution edu and review   Assessment/Plan    PT Assessment Patient needs continued PT services  PT Problem List Decreased strength;Decreased range of motion;Decreased activity tolerance;Decreased balance;Decreased mobility;Decreased safety awareness       PT Treatment Interventions DME instruction;Therapeutic activities;Gait training;Therapeutic exercise;Patient/family education;Stair training;Balance training;Functional mobility training;Neuromuscular re-education    PT Goals  (Current goals can be found in the Care Plan section)  Acute Rehab PT Goals Patient Stated Goal: to get off the cane PT Goal Formulation: With patient Time For Goal Achievement: 06/20/22 Potential to Achieve Goals: Good    Frequency BID     Co-evaluation               AM-PAC PT "6 Clicks" Mobility  Outcome Measure Help needed turning from your back to your side while in a flat bed without using bedrails?: None Help needed moving from lying on your back to sitting on the side of a flat bed without using bedrails?: None Help needed moving to and from a bed to a chair (including a wheelchair)?: None Help needed standing up from a chair using your arms (e.g., wheelchair or bedside chair)?: A Little Help needed to walk in hospital room?: A Little Help needed climbing 3-5 steps with a railing? : A Little 6 Click Score: 21    End of Session Equipment Utilized During Treatment: Gait belt Activity Tolerance: Patient tolerated treatment well Patient left: in chair;with family/visitor present;with nursing/sitter in room;with chair alarm set;with SCD's reapplied;with call bell/phone within reach Nurse Communication: Mobility status PT Visit Diagnosis: Other abnormalities of gait (R26.89), Muscle Weakness (M62.81), Pain    Time: 8676-7209 PT Time Calculation (min) (ACUTE ONLY): 47 min   Charges:            Glenice Laine MPH, SPT 06/07/22, 11:49 AM

## 2022-06-07 NOTE — Anesthesia Postprocedure Evaluation (Signed)
Anesthesia Post Note  Patient: Bryan Frey  Procedure(s) Performed: IRRIGATION AND DEBRIDEMENT OF LEFT HIP WITH FEMORAL HEAD EXCHANGE (Left: Hip)  Patient location during evaluation: PACU Anesthesia Type: General Level of consciousness: awake and alert Pain management: pain level controlled Vital Signs Assessment: post-procedure vital signs reviewed and stable Respiratory status: spontaneous breathing, nonlabored ventilation and respiratory function stable Cardiovascular status: blood pressure returned to baseline and stable Postop Assessment: no apparent nausea or vomiting Anesthetic complications: no   No notable events documented.   Last Vitals:  Vitals:   06/07/22 0442 06/07/22 0759  BP: (!) 114/57 108/66  Pulse: 67 70  Resp: 18 16  Temp: 36.6 C 36.6 C  SpO2: 96% 96%    Last Pain:  Vitals:   06/07/22 0847  TempSrc:   PainSc: 3                  [place] Ouch

## 2022-06-07 NOTE — Final Progress Note (Signed)
   Subjective: 1 Day Post-Op Procedure(s) (LRB): IRRIGATION AND DEBRIDEMENT OF LEFT HIP WITH FEMORAL HEAD EXCHANGE (Left) Patient reports pain as mild.   Patient is well, and has had no acute complaints or problems Denies any CP, SOB, ABD pain. We will continue therapy today.  Plan is to go Home after hospital stay.  Objective: Vital signs in last 24 hours: Temp:  [96.8 F (36 C)-98 F (36.7 C)] 97.9 F (36.6 C) (09/02 0759) Pulse Rate:  [63-73] 70 (09/02 0759) Resp:  [11-18] 16 (09/02 0759) BP: (108-151)/(57-70) 108/66 (09/02 0759) SpO2:  [91 %-100 %] 96 % (09/02 0759) Weight:  [97.5 kg] 97.5 kg (09/01 1519)  Intake/Output from previous day: 09/01 0701 - 09/02 0700 In: 2378.3 [P.O.:120; I.V.:1658.3; IV Piggyback:600] Out: 1000 [Urine:850; Blood:150] Intake/Output this shift: Total I/O In: -  Out: 120 [Drains:120]  No results for input(s): "HGB" in the last 72 hours. No results for input(s): "WBC", "RBC", "HCT", "PLT" in the last 72 hours. No results for input(s): "NA", "K", "CL", "CO2", "BUN", "CREATININE", "GLUCOSE", "CALCIUM" in the last 72 hours. No results for input(s): "LABPT", "INR" in the last 72 hours.  EXAM General - Patient is Alert, Appropriate, and Oriented Extremity - Neurovascular intact Sensation intact distally Intact pulses distally Dorsiflexion/Plantar flexion intact No cellulitis present Compartment soft Dressing - dressing C/D/I and no drainage, provena intact with out drainage. Hemovac removed Motor Function - intact, moving foot and toes well on exam.   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 removed tumor and all is clear since then   Nephrolithiasis    Neuromuscular disorder ([place])    facial nerve pain   Spinal stenosis of lumbar region    with neurogenic claudication   Stroke ([place]) 2010   heat stroke d/t a hot day and working under a house.no problems after   Trigeminal neuralgia     Assessment/Plan:   1 Day Post-Op Procedure(s) (LRB): IRRIGATION AND DEBRIDEMENT OF LEFT HIP WITH FEMORAL HEAD EXCHANGE (Left) Principal Problem:   Hx of total hip arthroplasty, left  Estimated body mass index is 29.99 kg/m as calculated from the following:   Height as of this encounter: '5\' 11"'$  (1.803 m).   Weight as of this encounter: 97.5 kg. Advance diet Up with therapy Pain well controlled VSS Cultures pending, no growth. Continue with IV abx Hemovac removed CM to assist with discharge to home with HHPT  DVT Prophylaxis - Aspirin, TED hose, and SCDs Weight-Bearing as tolerated to left leg   T. Rachelle Hora, PA-C [place] 06/07/2022, 9:27 AM

## 2022-06-07 NOTE — Progress Notes (Signed)
1442 D/c instructions reviewed with pt and wife. All questions and concerns answered/ IV removed. Pt getting dressed at this time

## 2022-06-07 NOTE — TOC CM/SW Note (Addendum)
TOC was notified by [place] with [place] that patient was prearranged by [employer] for [facility] through [place].  Asked PA for [facility] orders.  Notified [place] of [place] today.   Oleh Genin, [place]

## 2022-06-07 NOTE — Plan of Care (Signed)
  Problem: Education: Goal: Knowledge of the prescribed therapeutic regimen will improve Outcome: Progressing   Problem: Activity: Goal: Ability to avoid complications of mobility impairment will improve Outcome: Progressing   Problem: Clinical Measurements: Goal: Postoperative complications will be avoided or minimized Outcome: Progressing   Problem: Pain Management: Goal: Pain level will decrease with appropriate interventions Outcome: Progressing   Problem: Coping: Goal: Level of anxiety will decrease Outcome: Progressing   Problem: Nutrition: Goal: Adequate nutrition will be maintained Outcome: Progressing   Problem: Elimination: Goal: Will not experience complications related to bowel motility Outcome: Progressing

## 2022-06-07 NOTE — Progress Notes (Signed)
Physical Therapy Treatment Patient Details Name: Bryan Frey MRN: [id number] DOB: 02/06/1946 Today's Date: 06/07/2022   History of Present Illness Pt is 53 YOM s/p L THA revision admitted for irrigation and debriedment and femoral head exchange. PMH includes: L THA, ED, HLD, HTN, internuclear opthalmoplegia, kidney stones, spinal stenosis of lumbar spine, and trigeminal neuralgia.    PT Comments    Pt presents to PT in recliner and agreeable to participate in therapy services.  Pt was pleasant and motivated to participate during the session and put forth good effort throughout. Pt required occasional verbal cueing for sequencing during transfers and amb to maintain posterior hip precautions, but with no LOB or physical assistance required. Able to recall stair sequencing technique w no cueing needed, but did have to be cued during stair descent for sequencing, but had no LOB and did not require any physical assistance. Would benefit from skilled HHPT to address above deficits in strength, functional mobility, and balance to promote optimal return to PLOF.   Recommendations for follow up therapy are one component of a multi-disciplinary discharge planning process, led by the attending physician.  Recommendations may be updated based on patient status, additional functional criteria and insurance authorization.  Follow Up Recommendations  Home health PT     Assistance Recommended at Discharge Intermittent Supervision/Assistance  Patient can return home with the following A little help with walking and/or transfers;Assistance with cooking/housework;Assist for transportation;A little help with bathing/dressing/bathroom;Help with stairs or ramp for entrance   Equipment Recommendations  None recommended by PT    Recommendations for Other Services       Precautions / Restrictions Precautions Precautions: Posterior Hip Precaution Booklet Issued: Yes (comment) Restrictions Weight Bearing  Restrictions: Yes LLE Weight Bearing: Weight bearing as tolerated     Mobility  Bed Mobility Overal bed mobility:             General bed mobility comments: Pt. sitting up in recliner chair upon arrival.    Transfers Overall transfer level: Needs assistance Equipment used: Rolling walker (2 wheels) Transfers: Sit to/from Stand Sit to Stand: Min guard           General transfer comment: no physical assistance required, some cueing needed for sequencing    Ambulation/Gait Ambulation/Gait assistance: Min guard   Assistive device: Rolling walker (2 wheels) Gait Pattern/deviations: Step-through pattern, Trunk flexed Gait velocity: decr     General Gait Details: utilized step through pattern, no LOB, CGA occasional cues for sequencing   Stairs Stairs: Yes Stairs assistance: Min guard Stair Management: One rail Right, Step to pattern, With cane Number of Stairs: 4 General stair comments: edu on "up w good/down w bad technique" pt verbalized and demonstrated understanding, no LOB   Wheelchair Mobility    Modified Rankin (Stroke Patients Only)       Balance Overall balance assessment: Needs assistance Sitting-balance support: No upper extremity supported, Feet supported Sitting balance-Leahy Scale: Good       Standing balance-Leahy Scale: Good                              Cognition Arousal/Alertness: Awake/alert Behavior During Therapy: WFL for tasks assessed/performed, Impulsive Overall Cognitive Status: Within Functional Limits for tasks assessed  Exercises Total Joint Exercises Other Exercises Other Exercises: stair negotation edu, posterior hip precaution edu, car transfer edu    General Comments        Pertinent Vitals/Pain Pain Assessment Pain Assessment: No/denies pain Pain Score: 1  Pain Location: L hip Pain Descriptors / Indicators: Aching, Discomfort Pain  Intervention(s): Monitored during session, Repositioned    Home Living Family/patient expects to be discharged to:: Private residence Living Arrangements: Spouse/significant other;Children Available Help at Discharge: Family;Available 24 hours/day Type of Home: House Home Access: Stairs to enter Entrance Stairs-Rails: Left Entrance Stairs-Number of Steps: 2   Home Layout: One level Home Equipment: Conservation officer, nature (2 wheels);Cane - single point;Hand held shower head;Shower seat;Grab bars - tub/shower;Rollator (4 wheels) Additional Comments: LE ADL A/E    Prior Function            PT Goals (current goals can now be found in the care plan section) Acute Rehab PT Goals Patient Stated Goal: to get off the cane PT Goal Formulation: With patient Time For Goal Achievement: 06/20/22 Potential to Achieve Goals: Good Progress towards PT goals: Progressing toward goals    Frequency    BID      PT Plan Current plan remains appropriate    Co-evaluation              AM-PAC PT "6 Clicks" Mobility   Outcome Measure  Help needed turning from your back to your side while in a flat bed without using bedrails?: None Help needed moving from lying on your back to sitting on the side of a flat bed without using bedrails?: None Help needed moving to and from a bed to a chair (including a wheelchair)?: None Help needed standing up from a chair using your arms (e.g., wheelchair or bedside chair)?: A Little Help needed to walk in hospital room?: A Little Help needed climbing 3-5 steps with a railing? : A Little 6 Click Score: 21    End of Session Equipment Utilized During Treatment: Gait belt Activity Tolerance: Patient tolerated treatment well Patient left: in chair;with family/visitor present;with chair alarm set;with SCD's reapplied;with call bell/phone within reach Nurse Communication: Mobility status PT Visit Diagnosis: Difficulty in walking, not elsewhere classified  (R26.2);Pain;Other abnormalities of gait and mobility (R26.89);Muscle weakness (generalized) (M62.81) Pain - Right/Left: Left Pain - part of body: Hip     Time: 0347-4259 PT Time Calculation (min) (ACUTE ONLY): 18 min  Charges:  $Gait Training: 8-22 mins $Therapeutic Activity: 8-22 mins                    Glenice Laine MPH, SPT 06/07/22, 3:07 PM

## 2022-06-07 NOTE — Progress Notes (Signed)
Patient helped into a standing position multiple times this am in attempt to void. Order in chart to cath patient if unable to void and bladder scan volume >367m. Bladder scan at 0500 >405mand at 0600 >45073mPt was straight cathed at 0630 and 500 ml was drained from bladder.

## 2022-06-07 NOTE — Plan of Care (Signed)

## 2022-06-07 NOTE — Discharge Summary (Signed)
Physician Discharge Summary  Patient ID: Bryan Frey MRN: [id number] DOB/AGE: 01-19-1946 76 y.o.  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 removed tumor and all is clear since then   Nephrolithiasis    Neuromuscular disorder ([place])    facial nerve pain   Spinal stenosis of lumbar region    with neurogenic claudication   Stroke ([place]) 2010   heat stroke d/t a hot day and working under a house.no problems after   Trigeminal neuralgia      Transfusion: none   Consultants (if any):   Discharged Condition: Improved  Hospital Course: Bryan Frey is an 76 y.o. male who was admitted 06/06/2022 with a diagnosis of Hx of total hip arthroplasty, left and went to the operating room on 06/06/2022 and underwent the above named procedures.    Surgeries: Procedure(s): IRRIGATION AND DEBRIDEMENT OF LEFT HIP WITH FEMORAL HEAD EXCHANGE on 06/06/2022 Patient  tolerated the surgery well. Taken to PACU where she was stabilized and then transferred to the orthopedic floor.  Started on aspirin 81 mg BID and SCDS, TEDs. Heels elevated on bed with rolled towels. No evidence of DVT. Negative [place]. Physical therapy started on day #1 for gait training and transfer. OT started day #1 for ADL and assisted devices.  Patient's foley was d/c on day #1. Patient's IV and hemovac was d/c on day #1.  Culture results with no growth.  Patient was switched over to p.o. antibiotics Bactrim.  On postop day 1 patient made excellent progress of physical therapy.  Vital signs stable.  Pain well controlled.  On post op day #1 patient was stable and ready for discharge to home with home health PT.  Implants: DePuy 36 mm M-SPEC +1.5 mm hip ball  He was given perioperative antibiotics:  Anti-infectives (From admission, onward)    Start     Dose/Rate Route Frequency Ordered Stop   06/07/22 0200  ceFAZolin (ANCEF) IVPB 2g/100 mL premix        2 g 200 mL/hr over 30 Minutes Intravenous Every 6 hours 06/06/22 2232 06/07/22 0923   06/07/22 0000  sulfamethoxazole-trimethoprim (BACTRIM DS) 800-160 MG tablet        1 tablet Oral 2 times daily 06/07/22 1208 06/17/22 2359  06/06/22 1831  gentamicin (GARAMYCIN) 240 mg  Status:  Discontinued          As needed 06/06/22 1833 06/06/22 2109   06/06/22 1526  ceFAZolin (ANCEF) 2-4 GM/100ML-% IVPB       Note to Pharmacy: Norton Blizzard A: cabinet override      06/06/22 1526 06/06/22 1745   06/06/22 1100  ceFAZolin (ANCEF) IVPB 2g/100 mL premix        2 g 200 mL/hr over 30 Minutes Intravenous On call to O.R. 06/06/22 1049 06/06/22 1958     .  He was given sequential compression devices, early ambulation, and Aspirin TEDs for DVT prophylaxis.  He benefited maximally from the hospital stay and there were no complications.    Recent vital signs:  Vitals:   06/07/22 0442 06/07/22 0759  BP: (!) 114/57 108/66  Pulse: 67 70  Resp:  18 16  Temp: 97.9 F (36.6 C) 97.9 F (36.6 C)  SpO2: 96% 96%    Recent laboratory studies:  Lab Results  Component Value Date   HGB 8.9 (L) 04/08/2020   HGB 9.1 (L) 04/07/2020   HGB 13.2 04/03/2020   Lab Results  Component Value Date   WBC 7.2 04/08/2020   PLT 268 04/08/2020   Lab Results  Component Value Date   INR 1.0 04/03/2020   Lab Results  Component Value Date   NA 140 04/03/2020   K 4.3 04/03/2020   CL 98 04/03/2020   CO2 30 04/03/2020   BUN 21 04/03/2020   CREATININE 1.20 04/03/2020   GLUCOSE 112 (H) 04/03/2020    Discharge Medications:   Allergies as of 06/07/2022       Reactions   Morphine And Related Other (See Comments)   Hallucinations   5-alpha Reductase Inhibitors    Statins    Other reaction(s): Muscle Pain        Medication List     STOP taking these medications    enoxaparin 40 MG/0.4ML injection Commonly known as: LOVENOX       TAKE these medications    acetaminophen 500 MG tablet Commonly known as: TYLENOL Take 1,000 mg by mouth daily.   amLODipine 10 MG tablet Commonly known as: NORVASC Take 10 mg by mouth daily.   aspirin 81 MG chewable tablet Chew 1 tablet (81 mg total) by mouth 2 (two) times daily.   carbamazepine 200 MG tablet Commonly known as: TEGRETOL Take 400 mg by mouth 3 (three) times daily.   celecoxib 200 MG capsule Commonly known as: CELEBREX Take 1 capsule (200 mg total) by mouth 2 (two) times daily.   cyanocobalamin 1000 MCG tablet Commonly known as: VITAMIN B12 Take 1,000 mcg by mouth daily.   cyclobenzaprine 10 MG tablet Commonly known as: FLEXERIL Take 10 mg by mouth daily. Takes in the morning   diphenhydramine-acetaminophen 25-500 MG Tabs tablet Commonly known as: TYLENOL PM Take 2 tablets by mouth at bedtime.   DULoxetine 60 MG capsule Commonly known as: CYMBALTA Take 120 mg by mouth daily.   hydrochlorothiazide 25 MG tablet Commonly known as: HYDRODIURIL Take 25 mg by mouth  daily.   Melatonin 5 MG Caps Take 5 mg by mouth at bedtime.   oxyCODONE 5 MG immediate release tablet Commonly known as: Oxy IR/ROXICODONE Take 1 tablet (5 mg total) by mouth every 4 (four) hours as needed for moderate pain (pain score 4-6).   senna-docusate 8.6-50 MG tablet Commonly known as: Senokot-S Take 1 tablet by mouth 2 (two)  times daily.   sulfamethoxazole-trimethoprim 800-160 MG tablet Commonly known as: BACTRIM DS Take 1 tablet by mouth 2 (two) times daily for 10 days.   tamsulosin 0.4 MG Caps capsule Commonly known as: FLOMAX Take 1 capsule (0.4 mg total) by mouth daily.   traMADol 50 MG tablet Commonly known as: ULTRAM Take 1 tablet (50 mg total) by mouth every 4 (four) hours as needed for moderate pain. What changed: Another medication with the same name was removed. Continue taking this medication, and follow the directions you see here.               Durable Medical Equipment  (From admission, onward)           Start     Ordered   06/06/22 2233  DME Walker rolling  Once       Question:  Patient needs a walker to treat with the following condition  Answer:  S/P total hip arthroplasty   06/06/22 2232   06/06/22 2233  DME Bedside commode  Once       Question:  Patient needs a bedside commode to treat with the following condition  Answer:  S/P total hip arthroplasty   06/06/22 2232            Diagnostic Studies: MR HIP LEFT WO CONTRAST  Result Date: 06/04/2022 CLINICAL DATA:  Status post revision of total hip replacement Z96.649 (ICD-10-CM) EXAM: MR OF THE LEFT HIP WITHOUT CONTRAST TECHNIQUE: Multiplanar, multisequence MR imaging was performed. No intravenous contrast was administered. COMPARISON:  None Available. FINDINGS: Bones: Bilateral hip arthroplasties with associated susceptibility artifact, obscuring the medial adjacent bony and soft tissues. There is mild marrow edema at the junction of the posteroinferior acetabulum and the ischial  tuberosity. No distinct fracture line. No focal bone lesion. Lumbar spine fusion hardware also with associated susceptibility artifact. The sacroiliac joints are unremarkable. Mild degenerative changes of the symphysis pubis. Articular cartilage and labrum Articular cartilage:  Prior hip arthroplasties. Labrum: Prior hip arthroplasties. Joint or bursal effusion Joint effusion: Small left hip effusion.  No right hip effusion. Bursae: Questionable trochanteric bursal involvement by the fluid collection described below. Muscles and tendons Muscles and tendons: The gluteal tendons are intact. The proximal hamstrings are intact.Mild reactive edema within the right hip adductors. Reactive edema within the left gluteus maximus. Other findings Miscellaneous: There is a fluid collection along the posterior aspect of the left hip extending to the skin surface which measures up to 2.4 cm short axis (series 4, image 26) and 10.1 cm in length from the skin to the left hip (series 2, image 14). The collection extends superiorly along the gluteus maximus and some along the superficial surface of the proximal IT band. IMPRESSION: Fluid collection along the posterior aspect of the left hip extending from the skin surface to the left hip joint, measuring up to 2.4 cm short axis and 10.1 cm in length and with superior extension along the gluteus maximus. Small left hip effusion. Findings are concerning for infection, recommend diagnostic aspiration. Nonspecific mild marrow edema at the junction of the posteroinferior acetabulum and the ischial tuberosity, could be reactive marrow edema or early changes of osteomyelitis. These results will be called to the ordering clinician or representative by the Radiologist Assistant, and communication documented in the PACS or [employer]. Electronically Signed   By: Maurine Simmering M.D.   On: 06/04/2022 13:01    Disposition:      Follow-up Information     Arvella Nigh,  Marijo Conception, PA-C. Schedule  an appointment as soon as possible for a visit on 06/13/2022.   Specialties: Orthopedic Surgery, Emergency Medicine Why: for provena removal and wound check Contact information: [place] [address] [phone]                  Signed: Feliberto Gottron 06/07/2022, 12:11 PM

## 2022-06-07 NOTE — Evaluation (Addendum)
Occupational Therapy Evaluation Patient Details Name: Bryan Frey MRN: [id number] DOB: Oct 06, 1946 Today's Date: 06/07/2022   History of Present Illness Pt is 75 YOM s/p L THA revision admitted for irrigation and debriedment. PMH includes: L THA, ED, HLD, HTN, internuclear opthalmoplegia, kidney stones, spinal stenosis of lumbar spine, and trigeminal neuralgia.   Clinical Impression   Pt. presents with 2/10 hip pain.  Pt. resides at home with his wife. Pt. was modified independent with ADLs, and IADL functioning. Pt. was able to drive. Pt. Has had multiple Hip surgeries. Pt. has all A/E for LE ADLs at home, and was using it. Pt. reports at times he had his wife occasionally assist with donning his socks because it is quicker for him. Pt. education was provided about posterior hip precautions, and compensatory strategies for ADLs, and IADLs.  Pt. requires minA for LE ADLs secondary to the Hip precautions.  Pt. Plans to return home upon discharge with family to assist pt. as needed. No further OT services are warranted at this time. Pt., and wife are in agreement.     Recommendations for follow up therapy are one component of a multi-disciplinary discharge planning process, led by the attending physician.  Recommendations may be updated based on patient status, additional functional criteria and insurance authorization.   Follow Up Recommendations  No OT follow up    Assistance Recommended at Discharge    Patient can return home with the following Assistance with cooking/housework;Assist for transportation    Functional Status Assessment     Equipment Recommendations      None  Recommendations for Other Services       Precautions / Restrictions Precautions Precautions: Posterior Hip Precaution Booklet Issued: Yes (comment) Restrictions Weight Bearing Restrictions: Yes LLE Weight Bearing: Weight bearing as tolerated      Mobility Bed Mobility               General bed  mobility comments: Pt. sitting up in recliner chair upon arrival.    Transfers Overall transfer level: Needs assistance Equipment used: Rolling walker (2 wheels) Transfers: Sit to/from Stand Sit to Stand: Min guard      Mobility per PT report            Balance                                           ADL either performed or assessed with clinical judgement   ADL Overall ADL's : Needs assistance/impaired                     Lower Body Dressing: Minimal assistance Lower Body Dressing Details (indicate cue type and reason): Has LE A/E at home from previous hip surgeries, and knows how to, and was actively using the equipment at home .                     Vision Baseline Vision/History: 1 Wears glasses Patient Visual Report: No change from baseline       Perception     Praxis      Pertinent Vitals/Pain Pain Assessment Pain Assessment: 0-10 Pain Score: 2  Pain Location: Left Hip Pain Descriptors / Indicators: Aching, Discomfort Pain Intervention(s): Limited activity within patient's tolerance, Monitored during session     Hand Dominance Right   Extremity/Trunk Assessment Upper Extremity Assessment Upper Extremity Assessment:  Overall WFL for tasks assessed   Lower Extremity Assessment Lower Extremity Assessment: LLE deficits/detail LLE Deficits / Details: impaired d/t surgical status   Cervical / Trunk Assessment Cervical / Trunk Assessment: Kyphotic   Communication Communication Communication: No difficulties   Cognition Arousal/Alertness: Awake/alert Behavior During Therapy: WFL for tasks assessed/performed Overall Cognitive Status: Within Functional Limits for tasks assessed                                       General Comments       Exercises     Shoulder Instructions      Home Living Family/patient expects to be discharged to:: Private residence Living Arrangements: Spouse/significant  other;Children Available Help at Discharge: Family;Available 24 hours/day Type of Home: House Home Access: Stairs to enter [employer] of Steps: 2 Entrance Stairs-Rails: Left Home Layout: One level     Bathroom Shower/Tub: Occupational psychologist: Standard     Home Equipment: Conservation officer, nature (2 wheels);Cane - single point;Hand held shower head;Shower seat;Grab bars - tub/shower;Rollator (4 wheels)   Additional Comments: LE ADL A/E      Prior Functioning/Environment Prior Level of Function : Independent/Modified Independent             Mobility Comments: full community amb w AD ADLs Comments: Modified independent with IADLs, and IADLs. Driving.        OT Problem List: Decreased range of motion      OT Treatment/Interventions:      OT Goals(Current goals can be found in the care plan section) Acute Rehab OT Goals Patient Stated Goal: To return home OT Goal Formulation: With patient Time For Goal Achievement: 06/07/22 Potential to Achieve Goals: Good  OT Frequency:      Co-evaluation              AM-PAC OT "6 Clicks" Daily Activity     Outcome Measure Help from another person eating meals?: None Help from another person taking care of personal grooming?: None Help from another person toileting, which includes using toliet, bedpan, or urinal?: A Little Help from another person bathing (including washing, rinsing, drying)?: A Little Help from another person to put on and taking off regular upper body clothing?: None Help from another person to put on and taking off regular lower body clothing?: A Little 6 Click Score: 21   End of Session Equipment Utilized During Treatment: Gait belt  Activity Tolerance: Patient tolerated treatment well Patient left: in chair;with call bell/phone within reach;with chair alarm set;with family/visitor present  OT Visit Diagnosis: Muscle weakness (generalized) (M62.81)                Time: 1040-1100 OT  Time Calculation (min): 20 min Charges:  OT General Charges $OT Visit: 1 Visit OT Evaluation $OT Eval Low Complexity: 1 Low  Harrel Carina, MS, OTR/L   Harrel Carina 06/07/2022, 12:35 PM

## 2022-06-08 ENCOUNTER — Encounter: Payer: Self-pay | Admitting: Orthopedic Surgery

## 2022-06-10 ENCOUNTER — Encounter: Payer: Self-pay | Admitting: Orthopedic Surgery

## 2022-06-12 LAB — AEROBIC/ANAEROBIC CULTURE W GRAM STAIN (SURGICAL/DEEP WOUND)
Culture: NO GROWTH
Gram Stain: NONE SEEN

## 2022-06-13 ENCOUNTER — Encounter: Payer: Self-pay | Admitting: Orthopedic Surgery

## 2022-06-20 LAB — SUSCEPTIBILITY, AER + ANAEROB
Source of Sample: 8680
Source of Sample: 182261

## 2022-06-20 LAB — SUSCEPTIBILITY RESULT

## 2022-06-23 LAB — AEROBIC/ANAEROBIC CULTURE W GRAM STAIN (SURGICAL/DEEP WOUND)
Gram Stain: NONE SEEN
Gram Stain: NONE SEEN

## 2022-06-26 ENCOUNTER — Other Ambulatory Visit
Admission: RE | Admit: 2022-06-26 | Discharge: 2022-06-26 | Disposition: A | Discharge status: Home / Self Care | Payer: Medicare HMO | Source: Ambulatory Visit | Attending: Infectious Diseases | Admitting: Infectious Diseases

## 2022-06-26 ENCOUNTER — Ambulatory Visit: Payer: Medicare HMO | Attending: Infectious Diseases | Admitting: Infectious Diseases

## 2022-06-26 ENCOUNTER — Telehealth: Payer: Self-pay

## 2022-06-26 VITALS — BP 113/73 | HR 73 | Temp 97.7°F | Ht 71.0 in | Wt 211.0 lb

## 2022-06-26 DIAGNOSIS — T8450XA Infection and inflammatory reaction due to unspecified internal joint prosthesis, initial encounter: Secondary | ICD-10-CM | POA: Insufficient documentation

## 2022-06-26 DIAGNOSIS — T8452XA Infection and inflammatory reaction due to internal left hip prosthesis, initial encounter: Secondary | ICD-10-CM | POA: Diagnosis present

## 2022-06-26 DIAGNOSIS — I10 Essential (primary) hypertension: Secondary | ICD-10-CM | POA: Diagnosis not present

## 2022-06-26 DIAGNOSIS — X58XXXA Exposure to other specified factors, initial encounter: Secondary | ICD-10-CM | POA: Diagnosis not present

## 2022-06-26 MED ORDER — SODIUM CHLORIDE 0.9 % IV SOLN: 8.0000 mg/kg | Freq: Once | INTRAVENOUS | Status: DC

## 2022-06-26 MED ORDER — RIFAMPIN 300 MG PO CAPS: 300.0000 mg | ORAL_CAPSULE | Freq: Two times a day (BID) | ORAL | 1 refills | Status: DC

## 2022-06-26 NOTE — Patient Instructions (Addendum)
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 This will be Daptomycin. The other antibiotic is rifampin '300mg'$  oral capsule Twice a day . Will follow up 3 weeks

## 2022-06-26 NOTE — Telephone Encounter (Signed)
Dr. Delaine Lame and I have connected with [facility] day surgery to have them contact the patient to schedule picc line placement and first dose. Patient aware to wait for a call from day surgery.  I have also reached out to [facility] to let them orders regarding IV antibiotics and that patient will get first dose at day surgery. Pam will coordinate with family to do teaching at day surgery.  Tynetta Bachmann T Brooks Sailors

## 2022-06-26 NOTE — Progress Notes (Signed)
NAME: Bryan Frey  DOB: 06-04-1946  MRN: [id number]  Date/Time: 06/26/2022 9:02 AM  REQUESTING PROVIDER: Dr.Hooten Subjective:  REASON FOR CONSULT: left hip Prosthetic joint infection ?here with his wife  Bryan Frey is a 76 y.o. male with a history of HTN, trigeminal neuralgia on carbamazepine,  left hip hemiarthroplasty X2  last one being 2 yrs ago , lubar fusion developed a soft blister on the surgical scar 3 weeks ago, He had no pain- the blister opened up and he had lot of discharge- Eh saw ortho on 8/28 and had imaging suspicious for a sinus tract- HE had culture of the fluid which grew MSSA- On 06/06/22 he underwent washout and polyeytlene capsule exchange. 3 cultures were sent- On 9/18 it was finalized as staph lugdunensis from the joint and the culture from under IT band- both these cultures have different Susceptibility- one is oxacillin susceptible and the other is oxacillin resistant HE is is currently on bactrim and is here to discuss further management.  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   heat stroke d/t a hot day and working under a house.no problems after   Trigeminal neuralgia      Past Surgical History:  Procedure Laterality Date   BACK SURGERY     CATARACT EXTRACTION W/PHACO Left 12/24/2017   Procedure: CATARACT EXTRACTION PHACO AND INTRAOCULAR LENS PLACEMENT (IOC);  Surgeon: Eulogio Bear, MD;  Location: [facility];  Service: Ophthalmology;  Laterality: Left;  Lot # [id number] H [place]: 00:30.4 AP%: 9.4 CDE: 2.84   CATARACT EXTRACTION W/PHACO Right 01/26/2018   Procedure: CATARACT EXTRACTION PHACO AND INTRAOCULAR LENS PLACEMENT (IOC) RIGHT;  Surgeon: Eulogio Bear, MD;  Location: [facility];  Service: Ophthalmology;  Laterality: Right;   COLONOSCOPY     COLONOSCOPY  12/03/2020   Procedure: COLONOSCOPY;  Surgeon: Toledo, Benay Pike, MD;  Location: [facility];  Service: Gastroenterology;;   EYE SURGERY Bilateral    cataract extra tions   INCISION AND DRAINAGE HIP Left 06/06/2022   Procedure: IRRIGATION AND DEBRIDEMENT OF LEFT HIP WITH FEMORAL HEAD EXCHANGE;  Surgeon: Dereck Leep, MD;  Location: [facility];  Service: Orthopedics;  Laterality: Left;   IR RADIOLOGIST EVAL & MGMT  03/18/2017   JOINT REPLACEMENT Bilateral    left hip replaced x 2, right hip x 1   Left total hip arthroplasty  08/07/2010   LUMBAR LAMINECTOMY/DECOMPRESSION MICRODISCECTOMY  02/09/2012   Procedure: LUMBAR LAMINECTOMY/DECOMPRESSION MICRODISCECTOMY 2 LEVELS;  Surgeon: Kristeen Miss, MD;  Location: [facility];  Service: Neurosurgery;  Laterality: Bilateral;  Bilateral Lumbar two-three,lumbar four-five laminectomies   NEPHROLITHOTOMY Right 02/09/2014   Procedure: NEPHROLITHOTOMY PERCUTANEOUS;  Surgeon: Franchot Gallo, MD;  Location: [facility];  Service: Urology;  Laterality: Right;   Right total hip arthroplasty  07/13/2014   TOTAL HIP REVISION Left 05/14/2015   Procedure: TOTAL HIP REVISION;  Surgeon: Dereck Leep, MD;  Location: [facility];  Service: Orthopedics;  Laterality: Left;   TOTAL HIP REVISION Left 04/06/2020   Procedure: TOTAL HIP REVISION;  Surgeon: Dereck Leep, MD;   Location: [facility];  Service: Orthopedics;  Laterality: Left;    Social History   Socioeconomic History   Marital status: Married    Spouse name: Terri   Number of children: 2   Years of education: 16   Highest education level: Not on file  Occupational History   Occupation: Engineer, structural    Comment: retired  Tobacco Use   Smoking status: Never   Smokeless tobacco: Never  Vaping Use   Vaping Use: Never used  Substance and Sexual Activity   Alcohol use: No   Drug use: No    Types: Other-see comments    Comment: tramadol   Sexual activity: Not Currently  Other Topics Concern   Not on file  Social History Narrative   Lives with wife   Caffeine use: Drinks coffee/tea/soda occassionally.   Right handed   Social Determinants of Health   Financial Resource Strain: Not on file  Food Insecurity: Not on file  Transportation Needs: Not on file  Physical Activity: Not on file  Stress: Not on file  Social Connections: Not on file  Intimate Partner Violence: Not on file    Family History  Problem Relation Age of Onset   Anesthesia problems Neg Hx    Allergies  Allergen Reactions   Morphine And Related Other (See Comments)    Hallucinations   5-Alpha Reductase Inhibitors    Statins     Other reaction(s): Muscle Pain   I? Current Outpatient Medications  Medication Sig Dispense Refill   acetaminophen (TYLENOL) 500 MG tablet Take 1,000 mg by mouth daily.     amLODipine (NORVASC) 10 MG tablet Take 10 mg by mouth daily.      aspirin 81 MG chewable tablet Chew 1 tablet (81 mg total) by mouth 2 (two) times daily. 60 tablet 0   carbamazepine (TEGRETOL) 200 MG tablet Take 400 mg by mouth 3 (three) times daily.      celecoxib (CELEBREX) 200 MG capsule Take 1 capsule (200 mg total) by mouth 2 (two) times daily. 90 capsule 0   diphenhydramine-acetaminophen (TYLENOL PM) 25-500 MG TABS Take 2 tablets by mouth at bedtime.     DULoxetine (CYMBALTA) 60 MG capsule Take 120 mg by mouth  daily.     hydrochlorothiazide (HYDRODIURIL) 25 MG tablet Take 25 mg by mouth daily.      Melatonin 5 MG CAPS Take 5 mg by mouth at bedtime.     senna-docusate (SENOKOT-S) 8.6-50 MG tablet Take 1 tablet by mouth 2 (two) times daily. 30 tablet 0   sulfamethoxazole-trimethoprim (BACTRIM DS) 800-160 MG tablet Take 1 tablet by mouth 2 (two) times daily.     tamsulosin (FLOMAX) 0.4 MG CAPS capsule Take 1 capsule (0.4 mg total) by mouth daily. 30 capsule 1   traMADol (ULTRAM) 50 MG tablet Take 1 tablet (50 mg total) by mouth every 4 (four) hours as needed  for moderate pain. 30 tablet 0   vitamin B-12 (CYANOCOBALAMIN) 1000 MCG tablet Take 1,000 mcg by mouth daily.     No current facility-administered medications for this visit.     Abtx:  Anti-infectives (From admission, onward)    None       REVIEW OF SYSTEMS:  Const: negative fever, negative chills, negative weight loss Eyes: negative diplopia or visual changes, negative eye pain ENT: negative coryza, negative sore throat Resp: negative cough, hemoptysis, dyspnea Cards: negative for chest pain, palpitations, lower extremity edema GU: negative for frequency, dysuria and hematuria GI: Negative for abdominal pain, diarrhea, bleeding, constipation Skin: negative for rash and pruritus Heme: negative for easy bruising and gum/nose bleeding MS: negative for myalgias, arthralgias, back pain and muscle weakness Neurolo:negative for headaches, dizziness, vertigo, memory problems  Psych: negative for feelings of anxiety, depression  Endocrine: negative for thyroid, diabetes Allergy/Immunology- as above  Objective:  VITALS:  BP 113/73   Pulse 73   Temp 97.7 F (36.5 C) (Temporal)   Ht _0  (1.803 m)   Wt 211 lb (95.7 kg)   BMI 29.43 kg/m   PHYSICAL EXAM:  General: Alert, cooperative, no distress, appears stated age.  Head: Normocephalic, without obvious abnormality, atraumatic. Eyes: Conjunctivae clear, anicteric sclerae. Pupils are  equal ENT Nares normal. No drainage or sinus tenderness. Lips, mucosa, and tongue normal. No Thrush Neck: Supple, symmetrical, no adenopathy, thyroid: non tender no carotid bruit and no JVD. Back: No CVA tenderness. Lungs: Clear to auscultation bilaterally. No Wheezing or Rhonchi. No rales. Heart: Regular rate and rhythm, no murmur, rub or gallop. Abdomen: Soft, non-tender,not distended. Bowel sounds normal. No masses Extremities: left upper thigh lateral aspect there is surgical scar- some dehiscence with discharge  Skin: No rashes or lesions. Or bruising Lymph: Cervical, supraclavicular normal. Neurologic: Grossly non-focal Pertinent Labs Lab Results CBC    Component Value Date/Time   WBC 7.2 04/08/2020 0450   RBC 2.83 (L) 04/08/2020 0450   HGB 8.9 (L) 04/08/2020 0450   HGB 10.5 (L) 07/12/2014 0625   HCT 25.6 (L) 04/08/2020 0450   HCT 42.7 06/26/2014 0937   PLT 268 04/08/2020 0450   PLT 195 07/12/2014 0625   MCV 90.5 04/08/2020 0450   MCV 90 06/26/2014 0937   MCH 31.4 04/08/2020 0450   MCHC 34.8 04/08/2020 0450   RDW 12.6 04/08/2020 0450   RDW 13.7 06/26/2014 0937   LYMPHSABS 1.8 04/03/2020 1335   MONOABS 0.6 04/03/2020 1335   EOSABS 0.8 (H) 04/03/2020 1335   BASOSABS 0.1 04/03/2020 1335       Latest Ref Rng & Units 04/03/2020    1:35 PM 03/27/2017   11:21 AM 03/10/2017    9:34 AM  CMP  Glucose 70 - 99 mg/dL 112  109    BUN 8 - 23 mg/dL _1 Creatinine 0.61 - 1.24 mg/dL 1.20  1.20  1.16   Sodium 135 - 145 mmol/L 140  143    Potassium 3.5 - 5.1 mmol/L 4.3  4.7    Chloride 98 - 111 mmol/L 98  101    CO2 22 - 32 mmol/L 30  26    Calcium 8.9 - 10.3 mg/dL 9.1  9.3    Total Protein 6.5 - 8.1 g/dL 7.7  7.2    Total Bilirubin 0.3 - 1.2 mg/dL 0.6  <0.2    Alkaline Phos 38 - 126 U/L 147  158    AST 15 - 41 U/L  22  19    ALT 0 - 44 U/L 24  17      Microbiology: 06/06/22 Wound culture from left hip joint staph lugdunensis - S to oxacillin 9/1 culture from soft  tissue- Staph L R to oxacillin   ? 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 in cultures- different susceptibilities- called lab and they said it was sent to [facility] for identiifcation and suscepbtibity- will call [facility] tomorrow Meanwhile pt is to get a PICC line and start Daptomycin + rifampin Po tomorrow Will need 6 weeks of IV followed by PO for 3 months or more  OPAT orders Diagnosis: PJI left hip Baseline Creatinine 1.20  Culture Result: staph lugdunensis  Allergies  Allergen Reactions   Morphine And Related Other (See Comments)    Hallucinations   5-Alpha Reductase Inhibitors    Statins     Other reaction(s): Muscle Pain    OPAT Orders Daptomcycin 69m/kg body weight = 750 mg IVPB every 24 hrs For 6 weeks  + rifampin 3053mPO BID for 6 weeks End Date: 08/07/22  [facility]  Labs weekly  on Mondays while on IV antibiotics: _X_ CBC with differential  _X_ CMP _X_ ESR X CRP X CPK  _X_ Please pull PIC at completion of IV antibiotics  Fax weekly lab results  promptly to [phone] Follow Up Appt: 3 weeks   Call [phone] any questions    ? ? ___________________________________________________ Discussed with patient, and his wife and with Dr.Hooten and day surgery Note:  This document was prepared using Dragon voice recognition software and may include unintentional dictation errors.

## 2022-06-27 ENCOUNTER — Ambulatory Visit
Admission: RE | Admit: 2022-06-27 | Discharge: 2022-06-27 | Disposition: A | Discharge status: Home / Self Care | Payer: Self-pay | Source: Ambulatory Visit | Attending: Infectious Diseases | Admitting: Infectious Diseases

## 2022-06-27 ENCOUNTER — Ambulatory Visit
Admission: RE | Admit: 2022-06-27 | Discharge: 2022-06-27 | Disposition: A | Discharge status: Home / Self Care | Payer: Medicare HMO | Source: Ambulatory Visit | Attending: Infectious Diseases | Admitting: Infectious Diseases

## 2022-06-27 ENCOUNTER — Encounter: Payer: Self-pay | Admitting: Orthopedic Surgery

## 2022-06-27 VITALS — BP 118/59 | HR 77 | Temp 97.5°F | Resp 14 | Ht 71.0 in | Wt 210.0 lb

## 2022-06-27 DIAGNOSIS — Z01818 Encounter for other preprocedural examination: Secondary | ICD-10-CM | POA: Diagnosis not present

## 2022-06-27 DIAGNOSIS — Z96649 Presence of unspecified artificial hip joint: Secondary | ICD-10-CM | POA: Diagnosis not present

## 2022-06-27 MED ORDER — SODIUM CHLORIDE 0.9 % IV SOLN
750.0000 mg | Freq: Every day | INTRAVENOUS | Status: DC
  Administered 2022-06-27: 750 mg via INTRAVENOUS
  Filled 2022-06-27: qty 15

## 2022-06-27 MED ORDER — CHLORHEXIDINE GLUCONATE CLOTH 2 % EX PADS: 6.0000 | MEDICATED_PAD | Freq: Every day | CUTANEOUS | Status: DC

## 2022-06-27 MED ORDER — SODIUM CHLORIDE 0.9% FLUSH
10.0000 mL | Freq: Two times a day (BID) | INTRAVENOUS | Status: DC
  Administered 2022-06-27: 10 mL

## 2022-06-27 MED ORDER — SODIUM CHLORIDE 0.9% FLUSH
10.0000 mL | INTRAVENOUS | Status: DC | PRN
  Administered 2022-06-27: 10 mL

## 2022-06-27 NOTE — Telephone Encounter (Addendum)
I followed  up with Mina Marble, RN  via secure chat with Day Surgery and they are still waiting to get some one from the IV picc team to get back to them on scheduling the patient. I will let [facility] and the patient know we are waiting to hear back. I will follow back up Monday.  [place] Leeanne Butters, CMA

## 2022-06-27 NOTE — Progress Notes (Signed)
Peripherally Inserted Central Catheter Placement  The IV Nurse has discussed with the patient and/or persons authorized to consent for the patient, the purpose of this procedure and the potential benefits and risks involved with this procedure.  The benefits include less needle sticks, lab draws from the catheter, and the patient may be discharged home with the catheter. Risks include, but not limited to, infection, bleeding, blood clot (thrombus formation), and puncture of an artery; nerve damage and irregular heartbeat and possibility to perform a PICC exchange if needed/ordered by physician.  Alternatives to this procedure were also discussed.  Bard Power PICC patient education guide, fact sheet on infection prevention and patient information card has been provided to patient /or left at bedside.    PICC Placement Documentation  PICC Single Lumen 27/51/70 Right Basilic 39 cm 0 cm (Active)  Indication for Insertion or Continuance of Line Home intravenous therapies (PICC only) 06/27/22 1500  Exposed Catheter (cm) 0 cm 06/27/22 1500  Site Assessment Clean, Dry, Intact 06/27/22 1500  Line Status Flushed;Saline locked;Blood return noted 06/27/22 1500  Dressing Type Transparent;Securing device 06/27/22 1500  Dressing Status Antimicrobial disc in place;Clean, Dry, Intact 06/27/22 1500  Safety Lock Not Applicable 01/74/94 4967  Line Care Connections checked and tightened 06/27/22 1500  Dressing Intervention New dressing 06/27/22 1500  Dressing Change Due 07/04/22 06/27/22 1500       Holley Bouche [place] 06/27/2022, 3:03 PM

## 2022-06-29 LAB — AEROBIC CULTURE W GRAM STAIN (SUPERFICIAL SPECIMEN): Culture: NO GROWTH

## 2022-06-30 NOTE — Telephone Encounter (Signed)
Picc line place for the patient on 06/27/22 at [facility]

## 2022-07-08 ENCOUNTER — Telehealth: Payer: Self-pay

## 2022-07-08 NOTE — Telephone Encounter (Signed)
Received call from [place], nurse with [facility], requesting lab orders.   Relayed that these were given to [facility] to distribute to [facility]. She states she does not have lab orders on file.   Gave Tabitha verbal orders for labs per OPAT note.   OPAT Orders Daptomcycin 8mg /kg body weight = 750 mg IVPB every 24 hrs For 6 weeks  + rifampin 300mg  PO BID for 6 weeks End Date: 08/07/22   [facility] Care Per Protocol:   Labs weekly  on Mondays while on IV antibiotics: _X_ CBC with differential   _X_ CMP _X_ ESR X CRP X CPK   _X_ Please pull PIC at completion of IV antibiotics    Tabitha: Register, RN

## 2022-07-17 ENCOUNTER — Ambulatory Visit: Payer: Medicare HMO | Attending: Infectious Diseases | Admitting: Infectious Diseases

## 2022-07-17 ENCOUNTER — Encounter: Payer: Self-pay | Admitting: Infectious Diseases

## 2022-07-17 VITALS — BP 120/79 | HR 82 | Temp 96.6°F | Ht 71.0 in | Wt 211.0 lb

## 2022-07-17 DIAGNOSIS — R7881 Bacteremia: Secondary | ICD-10-CM | POA: Diagnosis not present

## 2022-07-17 DIAGNOSIS — T8450XD Infection and inflammatory reaction due to unspecified internal joint prosthesis, subsequent encounter: Secondary | ICD-10-CM

## 2022-07-17 DIAGNOSIS — Z96642 Presence of left artificial hip joint: Secondary | ICD-10-CM | POA: Insufficient documentation

## 2022-07-17 DIAGNOSIS — X58XXXA Exposure to other specified factors, initial encounter: Secondary | ICD-10-CM | POA: Insufficient documentation

## 2022-07-17 DIAGNOSIS — T8452XA Infection and inflammatory reaction due to internal left hip prosthesis, initial encounter: Secondary | ICD-10-CM | POA: Insufficient documentation

## 2022-07-17 DIAGNOSIS — I10 Essential (primary) hypertension: Secondary | ICD-10-CM | POA: Insufficient documentation

## 2022-07-17 DIAGNOSIS — B958 Unspecified staphylococcus as the cause of diseases classified elsewhere: Secondary | ICD-10-CM | POA: Insufficient documentation

## 2022-07-17 NOTE — Patient Instructions (Addendum)
You are here for follow up of the left hip prosthetic joint infection- you are on daptomycin and rifampin- still have drainage-you are seeing Dr.Hooten today- I will discuss with him

## 2022-07-17 NOTE — Progress Notes (Signed)
NAME: Bryan Frey  DOB: 1945-10-23  MRN: [id number]  Date/Time: 07/17/2022 9:37 AM   Subjective:  : left hip Prosthetic joint infection- follow up visit ?here with his wife  Started on Iv dapto + Po rifampin 06/28/22 Since I last saw him his surgical  site has healed but there eis one area from which he has drainage still Walking with a cane No side effects from the antibiotics 100% adherent to Iv dapto  Following taken from last note  Bryan Frey is a 76 y.o. male with a history of HTN, trigeminal neuralgia on carbamazepine,  left hip hemiarthroplasty X2  last one being 2 yrs ago , lumbar fusion developed a soft blister on the surgical scar 3 weeks ago, He had no pain- the blister opened up and he had lot of discharge- He saw ortho on 8/28 and had imaging suspicious for a sinus tract- HE had culture of the fluid which grew MSSA- On 06/06/22 he underwent washout and polyeytlene capsule exchange. 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 removed tumor and all is clear since then   Nephrolithiasis    Neuromuscular disorder ([place])    facial nerve pain   Spinal stenosis of lumbar region     with neurogenic claudication   Stroke ([place]) 2010   heat stroke d/t a hot day and working under a house.no problems after   Trigeminal neuralgia     Past Surgical History:  Procedure Laterality Date   BACK SURGERY     CATARACT EXTRACTION W/PHACO Left 12/24/2017   Procedure: CATARACT EXTRACTION PHACO AND INTRAOCULAR LENS PLACEMENT (IOC);  Surgeon: Eulogio Bear, MD;  Location: [facility];  Service: Ophthalmology;  Laterality: Left;  Lot # [id number] H [place]: 00:30.4 AP%: 9.4 CDE: 2.84   CATARACT EXTRACTION W/PHACO Right 01/26/2018   Procedure: CATARACT EXTRACTION PHACO AND INTRAOCULAR LENS PLACEMENT (IOC) RIGHT;  Surgeon: Eulogio Bear, MD;  Location: [facility];  Service: Ophthalmology;  Laterality: Right;   COLONOSCOPY     COLONOSCOPY  12/03/2020   Procedure: COLONOSCOPY;  Surgeon: Toledo, Benay Pike, MD;  Location: [facility];  Service: Gastroenterology;;   EYE SURGERY Bilateral    cataract extra tions   INCISION AND DRAINAGE HIP Left 06/06/2022   Procedure: IRRIGATION AND DEBRIDEMENT OF LEFT HIP WITH FEMORAL HEAD EXCHANGE;  Surgeon: Dereck Leep, MD;  Location: [facility];  Service: Orthopedics;  Laterality: Left;   IR RADIOLOGIST EVAL & MGMT  03/18/2017   JOINT REPLACEMENT Bilateral  left hip replaced x 2, right hip x 1   Left total hip arthroplasty  08/07/2010   LUMBAR LAMINECTOMY/DECOMPRESSION MICRODISCECTOMY  02/09/2012   Procedure: LUMBAR LAMINECTOMY/DECOMPRESSION MICRODISCECTOMY 2 LEVELS;  Surgeon: Kristeen Miss, MD;  Location: [facility];  Service: Neurosurgery;  Laterality: Bilateral;  Bilateral Lumbar two-three,lumbar four-five laminectomies   NEPHROLITHOTOMY Right 02/09/2014   Procedure: NEPHROLITHOTOMY PERCUTANEOUS;  Surgeon: Franchot Gallo, MD;  Location: [facility];  Service: Urology;  Laterality: Right;   Right total hip arthroplasty  07/13/2014   TOTAL HIP REVISION Left 05/14/2015   Procedure: TOTAL HIP REVISION;  Surgeon: Dereck Leep, MD;  Location: [facility];   Service: Orthopedics;  Laterality: Left;   TOTAL HIP REVISION Left 04/06/2020   Procedure: TOTAL HIP REVISION;  Surgeon: Dereck Leep, MD;  Location: [facility];  Service: Orthopedics;  Laterality: Left;    Social History   Socioeconomic History   Marital status: Married    Spouse name: Terri   Number of children: 2   Years of education: 16   Highest education level: Not on file  Occupational History   Occupation: Engineer, structural    Comment: retired  Tobacco Use   Smoking status: Never   Smokeless tobacco: Never  Vaping Use   Vaping Use: Never used  Substance and Sexual Activity   Alcohol use: No   Drug use: No    Types: Other-see comments    Comment: tramadol   Sexual activity: Not Currently  Other Topics Concern   Not on file  Social History Narrative   Lives with wife   Caffeine use: Drinks coffee/tea/soda occassionally.   Right handed   Social Determinants of Health   Financial Resource Strain: Not on file  Food Insecurity: Not on file  Transportation Needs: Not on file  Physical Activity: Not on file  Stress: Not on file  Social Connections: Not on file  Intimate Partner Violence: Not on file    Family History  Problem Relation Age of Onset   Anesthesia problems Neg Hx    Allergies  Allergen Reactions   Morphine And Related Other (See Comments)    Hallucinations   Statins     Other reaction(s): Muscle Pain Other reaction(s): Muscle Pain   I? Current Outpatient Medications  Medication Sig Dispense Refill   acetaminophen (TYLENOL) 500 MG tablet Take 1,000 mg by mouth daily.     amLODipine (NORVASC) 10 MG tablet Take 10 mg by mouth daily.      carbamazepine (TEGRETOL) 200 MG tablet Take 400 mg by mouth 3 (three) times daily.      diphenhydramine-acetaminophen (TYLENOL PM) 25-500 MG TABS Take 2 tablets by mouth at bedtime.     DULoxetine (CYMBALTA) 60 MG capsule Take 60 mg by mouth 2 (two) times daily.     hydrochlorothiazide (HYDRODIURIL) 25 MG tablet  Take 25 mg by mouth daily.      Melatonin 5 MG CAPS Take 5 mg by mouth at bedtime.     rifampin (RIFADIN) 300 MG capsule Take 300 mg by mouth 2 (two) times daily.     tamsulosin (FLOMAX) 0.4 MG CAPS capsule Take 1 capsule (0.4 mg total) by mouth daily. 30 capsule 1   traMADol (ULTRAM) 50 MG tablet Take 1 tablet (50 mg total) by mouth every 4 (four) hours as needed for moderate pain. 30 tablet 0   vitamin B-12 (CYANOCOBALAMIN) 1000 MCG tablet Take 1,000 mcg by mouth daily.     Current Facility-Administered Medications  Medication Dose  Route Frequency Provider Last Rate Last Admin   DAPTOmycin (CUBICIN) 750 mg in sodium chloride 0.9 % IVPB  8 mg/kg Intravenous Once Tsosie Billing, MD         Abtx:  Anti-infectives (From admission, onward)    None       REVIEW OF SYSTEMS:  Const: negative fever, negative chills, negative weight loss Eyes: negative diplopia or visual changes, negative eye pain ENT: negative coryza, negative sore throat Resp: negative cough, hemoptysis, dyspnea Cards: negative for chest pain, palpitations, lower extremity edema GU: negative for frequency, dysuria and hematuria GI: Negative for abdominal pain, diarrhea, bleeding, constipation Skin: negative for rash and pruritus Heme: negative for easy bruising and gum/nose bleeding MS: left hip pain Neurolo:negative for headaches, dizziness, vertigo, memory problems  Psych: negative for feelings of anxiety, depression  Endocrine: negative for thyroid, diabetes Allergy/Immunology- as above  Objective:  VITALS:  BP 120/79   Pulse 82   Temp (!) 96.6 F (35.9 C) (Temporal)   Ht '5\' 11"'$  (1.803 m)   Wt 211 lb (95.7 kg)   BMI 29.43 kg/m   PHYSICAL EXAM:  General: looks well.  Head: Normocephalic, without obvious abnormality, atraumatic. Lungs: Clear to auscultation bilaterally. No Wheezing or Rhonchi. No rales. Heart: Regular rate and rhythm, no murmur, rub or gallop. Abdomen: Soft, non-tender,not  distended. Bowel sounds normal. No masses Extremities: left upper thigh lateral aspect there is surgical scar- no dehiscence- but there is drainage Rt PICC  Skin: No rashes or lesions. Or bruising Lymph: Cervical, supraclavicular normal. Neurologic: Grossly non-focal Pertinent Labs Lab Results CBC    Component Value Date/Time   WBC 7.2 04/08/2020 0450   RBC 2.83 (L) 04/08/2020 0450   HGB 8.9 (L) 04/08/2020 0450   HGB 10.5 (L) 07/12/2014 0625   HCT 25.6 (L) 04/08/2020 0450   HCT 42.7 06/26/2014 0937   PLT 268 04/08/2020 0450   PLT 195 07/12/2014 0625   MCV 90.5 04/08/2020 0450   MCV 90 06/26/2014 0937   MCH 31.4 04/08/2020 0450   MCHC 34.8 04/08/2020 0450   RDW 12.6 04/08/2020 0450   RDW 13.7 06/26/2014 0937   LYMPHSABS 1.8 04/03/2020 1335   MONOABS 0.6 04/03/2020 1335   EOSABS 0.8 (H) 04/03/2020 1335   BASOSABS 0.1 04/03/2020 1335       Latest Ref Rng & Units 04/03/2020    1:35 PM 03/27/2017   11:21 AM 03/10/2017    9:34 AM  CMP  Glucose 70 - 99 mg/dL 112  109    BUN 8 - 23 mg/dL '21  13  16   '$ Creatinine 0.61 - 1.24 mg/dL 1.20  1.20  1.16   Sodium 135 - 145 mmol/L 140  143    Potassium 3.5 - 5.1 mmol/L 4.3  4.7    Chloride 98 - 111 mmol/L 98  101    CO2 22 - 32 mmol/L 30  26    Calcium 8.9 - 10.3 mg/dL 9.1  9.3    Total Protein 6.5 - 8.1 g/dL 7.7  7.2    Total Bilirubin 0.3 - 1.2 mg/dL 0.6  <0.2    Alkaline Phos 38 - 126 U/L 147  158    AST 15 - 41 U/L 22  19    ALT 0 - 44 U/L 24  17      Microbiology: 06/06/22 Wound culture from left hip joint staph lugdunensis - S to oxacillin 9/1 culture from soft tissue- Staph L R to oxacillin   ? 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 ? ___________________________________________________ Discussed with patient, and his wife   Spoke to Dr Marry Guan in the evening- the drainage is likely doe to antibiotic beads that were placed during surgery Continue current management and evaluate in 2-3 weeks Note:  This document was prepared using Dragon voice recognition software and may include unintentional dictation errors.

## 2022-08-05 ENCOUNTER — Encounter: Payer: Self-pay | Admitting: Infectious Diseases

## 2022-08-05 ENCOUNTER — Other Ambulatory Visit
Admission: RE | Admit: 2022-08-05 | Discharge: 2022-08-05 | Disposition: A | Discharge status: Home / Self Care | Payer: Medicare HMO | Source: Ambulatory Visit | Attending: Infectious Diseases | Admitting: Infectious Diseases

## 2022-08-05 ENCOUNTER — Ambulatory Visit: Payer: Medicare HMO | Attending: Infectious Diseases | Admitting: Infectious Diseases

## 2022-08-05 VITALS — BP 121/63 | HR 80 | Temp 96.6°F | Ht 71.0 in | Wt 207.0 lb

## 2022-08-05 DIAGNOSIS — T8450XA Infection and inflammatory reaction due to unspecified internal joint prosthesis, initial encounter: Secondary | ICD-10-CM | POA: Insufficient documentation

## 2022-08-05 DIAGNOSIS — T8452XD Infection and inflammatory reaction due to internal left hip prosthesis, subsequent encounter: Secondary | ICD-10-CM | POA: Diagnosis not present

## 2022-08-05 DIAGNOSIS — T8450XD Infection and inflammatory reaction due to unspecified internal joint prosthesis, subsequent encounter: Secondary | ICD-10-CM | POA: Insufficient documentation

## 2022-08-05 DIAGNOSIS — R21 Rash and other nonspecific skin eruption: Secondary | ICD-10-CM | POA: Diagnosis not present

## 2022-08-05 DIAGNOSIS — Z96642 Presence of left artificial hip joint: Secondary | ICD-10-CM | POA: Insufficient documentation

## 2022-08-05 DIAGNOSIS — X58XXXD Exposure to other specified factors, subsequent encounter: Secondary | ICD-10-CM | POA: Insufficient documentation

## 2022-08-05 DIAGNOSIS — I10 Essential (primary) hypertension: Secondary | ICD-10-CM | POA: Diagnosis not present

## 2022-08-05 LAB — CBC WITH DIFFERENTIAL/PLATELET
Abs Immature Granulocytes: 0.02 10*3/uL (ref 0.00–0.07)
Basophils Absolute: 0.1 10*3/uL (ref 0.0–0.1)
Basophils Relative: 1 %
Eosinophils Absolute: 0.7 10*3/uL — ABNORMAL HIGH (ref 0.0–0.5)
Eosinophils Relative: 10 %
HCT: 37.2 % — ABNORMAL LOW (ref 39.0–52.0)
Hemoglobin: 12 g/dL — ABNORMAL LOW (ref 13.0–17.0)
Immature Granulocytes: 0 %
Lymphocytes Relative: 26 %
Lymphs Abs: 1.8 10*3/uL (ref 0.7–4.0)
MCH: 29.5 pg (ref 26.0–34.0)
MCHC: 32.3 g/dL (ref 30.0–36.0)
MCV: 91.4 fL (ref 80.0–100.0)
Monocytes Absolute: 0.6 10*3/uL (ref 0.1–1.0)
Monocytes Relative: 9 %
Neutro Abs: 3.7 10*3/uL (ref 1.7–7.7)
Neutrophils Relative %: 54 %
Platelets: 345 10*3/uL (ref 150–400)
RBC: 4.07 MIL/uL — ABNORMAL LOW (ref 4.22–5.81)
RDW: 13.2 % (ref 11.5–15.5)
WBC: 6.9 10*3/uL (ref 4.0–10.5)
nRBC: 0 % (ref 0.0–0.2)

## 2022-08-05 LAB — CK: Total CK: 180 U/L (ref 49–397)

## 2022-08-05 LAB — COMPREHENSIVE METABOLIC PANEL
ALT: 15 U/L (ref 0–44)
AST: 19 U/L (ref 15–41)
Albumin: 3.9 g/dL (ref 3.5–5.0)
Alkaline Phosphatase: 129 U/L — ABNORMAL HIGH (ref 38–126)
Anion gap: 9 (ref 5–15)
BUN: 14 mg/dL (ref 8–23)
CO2: 29 mmol/L (ref 22–32)
Calcium: 8.9 mg/dL (ref 8.9–10.3)
Chloride: 103 mmol/L (ref 98–111)
Creatinine, Ser: 1.19 mg/dL (ref 0.61–1.24)
GFR, Estimated: >60 mL/min (ref >60)
Glucose, Bld: 124 mg/dL — ABNORMAL HIGH (ref 70–99)
Potassium: 4.4 mmol/L (ref 3.5–5.1)
Sodium: 141 mmol/L (ref 135–145)
Total Bilirubin: 0.5 mg/dL (ref 0.3–1.2)
Total Protein: 7.6 g/dL (ref 6.5–8.1)

## 2022-08-05 LAB — SEDIMENTATION RATE: Sed Rate: 72 mm/hr — ABNORMAL HIGH (ref 0–20)

## 2022-08-05 LAB — C-REACTIVE PROTEIN: CRP: 2.4 mg/dL — ABNORMAL HIGH (ref <1.0)

## 2022-08-05 NOTE — Patient Instructions (Addendum)
You are here for follow up of the left prosthetic joint infection. You are on daptomycin and capsule rifampin You have an itch on your back, you have no taste You will stop rifampin today. You will finish Daptomycin on 08/07/22 You will do labs today After the lab results will discuss further oral antibiotics I will discuss with Dr.Hooten

## 2022-08-05 NOTE — Progress Notes (Signed)
NAME: Bryan Frey  DOB: 08-Feb-1946  MRN: [id number]  Date/Time: 08/05/2022 11:12 AM   Subjective:  left hip Prosthetic joint infection- follow up visit ?here with his wife  Started on Iv dapto + Po rifampin 06/28/22 Still having discharge from the surgical site at two spots where there is pin hole opening C/o itching on his back and a some spots on his back Thinks it is coming from rifampin  Bryan Frey is a 76 y.o. male with a history of HTN, trigeminal neuralgia on carbamazepine,  left hip hemiarthroplasty X2  last one being 2 yrs ago , lumbar fusion developed a soft blister on the surgical scar 3 weeks ago, He had no pain- the blister opened up and he had lot of discharge- He saw ortho on 8/28 and had imaging suspicious for a sinus tract- HE had culture of the fluid which grew MSSA- On 06/06/22 he underwent washout and polyeytlene capsule exchange. 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 removed tumor and all is clear since then   Nephrolithiasis    Neuromuscular disorder ([place])    facial nerve pain   Spinal stenosis of lumbar region    with neurogenic claudication   Stroke ([place]) 2010    heat stroke d/t a hot day and working under a house.no problems after   Trigeminal neuralgia     Past Surgical History:  Procedure Laterality Date   BACK SURGERY     CATARACT EXTRACTION W/PHACO Left 12/24/2017   Procedure: CATARACT EXTRACTION PHACO AND INTRAOCULAR LENS PLACEMENT (IOC);  Surgeon: Eulogio Bear, MD;  Location: [facility];  Service: Ophthalmology;  Laterality: Left;  Lot # [id number] H [place]: 00:30.4 AP%: 9.4 CDE: 2.84   CATARACT EXTRACTION W/PHACO Right 01/26/2018   Procedure: CATARACT EXTRACTION PHACO AND INTRAOCULAR LENS PLACEMENT (IOC) RIGHT;  Surgeon: Eulogio Bear, MD;  Location: [facility];  Service: Ophthalmology;  Laterality: Right;   COLONOSCOPY     COLONOSCOPY  12/03/2020   Procedure: COLONOSCOPY;  Surgeon: Toledo, Benay Pike, MD;  Location: [facility];  Service: Gastroenterology;;   EYE SURGERY Bilateral    cataract extra tions   INCISION AND DRAINAGE HIP Left 06/06/2022   Procedure: IRRIGATION AND DEBRIDEMENT OF LEFT HIP WITH FEMORAL HEAD EXCHANGE;  Surgeon: Dereck Leep, MD;  Location: [facility];  Service: Orthopedics;  Laterality: Left;   IR RADIOLOGIST EVAL & MGMT  03/18/2017   JOINT REPLACEMENT Bilateral    left hip replaced x 2, right hip x 1  Left total hip arthroplasty  08/07/2010   LUMBAR LAMINECTOMY/DECOMPRESSION MICRODISCECTOMY  02/09/2012   Procedure: LUMBAR LAMINECTOMY/DECOMPRESSION MICRODISCECTOMY 2 LEVELS;  Surgeon: Kristeen Miss, MD;  Location: [facility];  Service: Neurosurgery;  Laterality: Bilateral;  Bilateral Lumbar two-three,lumbar four-five laminectomies   NEPHROLITHOTOMY Right 02/09/2014   Procedure: NEPHROLITHOTOMY PERCUTANEOUS;  Surgeon: Franchot Gallo, MD;  Location: [facility];  Service: Urology;  Laterality: Right;   Right total hip arthroplasty  07/13/2014   TOTAL HIP REVISION Left 05/14/2015   Procedure: TOTAL HIP REVISION;  Surgeon: Dereck Leep, MD;  Location: [facility];  Service: Orthopedics;  Laterality: Left;   TOTAL  HIP REVISION Left 04/06/2020   Procedure: TOTAL HIP REVISION;  Surgeon: Dereck Leep, MD;  Location: [facility];  Service: Orthopedics;  Laterality: Left;    Social History   Socioeconomic History   Marital status: Married    Spouse name: Terri   Number of children: 2   Years of education: 16   Highest education level: Not on file  Occupational History   Occupation: Engineer, structural    Comment: retired  Tobacco Use   Smoking status: Never   Smokeless tobacco: Never  Vaping Use   Vaping Use: Never used  Substance and Sexual Activity   Alcohol use: No   Drug use: No    Types: Other-see comments    Comment: tramadol   Sexual activity: Not Currently  Other Topics Concern   Not on file  Social History Narrative   Lives with wife   Caffeine use: Drinks coffee/tea/soda occassionally.   Right handed   Social Determinants of Health   Financial Resource Strain: Not on file  Food Insecurity: Not on file  Transportation Needs: Not on file  Physical Activity: Not on file  Stress: Not on file  Social Connections: Not on file  Intimate Partner Violence: Not on file    Family History  Problem Relation Age of Onset   Anesthesia problems Neg Hx    Allergies  Allergen Reactions   Morphine And Related Other (See Comments)    Hallucinations   Statins     Other reaction(s): Muscle Pain Other reaction(s): Muscle Pain   I? Current Outpatient Medications  Medication Sig Dispense Refill   acetaminophen (TYLENOL) 500 MG tablet Take 1,000 mg by mouth daily.     amLODipine (NORVASC) 10 MG tablet Take 10 mg by mouth daily.      carbamazepine (TEGRETOL) 200 MG tablet Take 400 mg by mouth 3 (three) times daily.      diphenhydramine-acetaminophen (TYLENOL PM) 25-500 MG TABS Take 2 tablets by mouth at bedtime.     DULoxetine (CYMBALTA) 60 MG capsule Take 60 mg by mouth 2 (two) times daily.     hydrochlorothiazide (HYDRODIURIL) 25 MG tablet Take 25 mg by mouth daily.      Melatonin 5 MG CAPS  Take 5 mg by mouth at bedtime.     rifampin (RIFADIN) 300 MG capsule Take 300 mg by mouth 2 (two) times daily.     tamsulosin (FLOMAX) 0.4 MG CAPS capsule Take 1 capsule (0.4 mg total) by mouth daily. 30 capsule 1   traMADol (ULTRAM) 50 MG tablet Take 1 tablet (50 mg total) by mouth every 4 (four) hours as needed for moderate pain. 30 tablet 0   vitamin B-12 (CYANOCOBALAMIN) 1000 MCG tablet Take 1,000 mcg by mouth daily.     Current Facility-Administered Medications  Medication Dose Route Frequency Provider Last Rate Last Admin   DAPTOmycin (CUBICIN)  750 mg in sodium chloride 0.9 % IVPB  8 mg/kg Intravenous Once Tsosie Billing, MD         Abtx:  Anti-infectives (From admission, onward)    None       REVIEW OF SYSTEMS:  Const: negative fever, negative chills, negative weight loss Eyes: negative diplopia or visual changes, negative eye pain ENT: negative coryza, negative sore throat Resp: negative cough, hemoptysis, dyspnea Cards: negative for chest pain, palpitations, lower extremity edema GU: negative for frequency, dysuria and hematuria GI: Negative for abdominal pain, diarrhea, bleeding, constipation Skin: negative for rash and pruritus Heme: negative for easy bruising and gum/nose bleeding MS: left hip discharge Neurolo:negative for headaches, dizziness, vertigo, memory problems  Psych: negative for feelings of anxiety, depression  Endocrine: negative for thyroid, diabetes Allergy/Immunology- as above  Objective:  VITALS:  BP 121/63   Pulse 80   Temp (!) 96.6 F (35.9 C) (Temporal)   Ht '5\' 11"'$  (1.803 m)   Wt 207 lb (93.9 kg)   BMI 28.87 kg/m   PHYSICAL EXAM:  General: looks well.  Head: Normocephalic, without obvious abnormality, atraumatic. Lungs: Clear to auscultation bilaterally. No Wheezing or Rhonchi. No rales. Heart: Regular rate and rhythm, no murmur, rub or gallop. Abdomen: Soft, non-tender,not distended. Bowel sounds normal. No  masses Extremities: left upper thigh lateral aspect there is surgical scar- no dehiscence- but there are 2 pin holes which profusely discharges serous fluid Rt PICC Skin: No rashes or lesions. Or bruising Lymph: Cervical, supraclavicular normal. Neurologic: Grossly non-focal Pertinent Labs No labs since oct 19   Microbiology: 06/06/22 Wound culture from left hip joint staph lugdunensis - S to oxacillin 9/1 culture from soft tissue- Staph L R to oxacillin   ? 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 in cultures- different susceptibilities- called lab and they said it was sent to [facility] for identiifcation and suscepbtibity- and it was too late to repeat susceptibility Pt currently on dapto and rifampin' I  had sent culture on 06/26/22 when I first saw him and it was negative He will complete 6 weeks on 08/07/22, but he still has profuse drainage Will do labs today and discuss with Dr.Hooten regarding further management  ? ___________________________________________________ Discussed with patient, and his wife  Note:  This document was prepared using Dragon voice recognition software and may include unintentional dictation errors.

## 2022-08-07 ENCOUNTER — Ambulatory Visit: Payer: Medicare HMO | Admitting: Infectious Diseases

## 2022-08-07 ENCOUNTER — Other Ambulatory Visit: Payer: Self-pay | Admitting: Infectious Diseases

## 2022-08-07 DIAGNOSIS — T8450XD Infection and inflammatory reaction due to unspecified internal joint prosthesis, subsequent encounter: Secondary | ICD-10-CM

## 2022-08-07 MED ORDER — SULFAMETHOXAZOLE-TRIMETHOPRIM 800-160 MG PO TABS: 1.0000 | ORAL_TABLET | Freq: Two times a day (BID) | ORAL | 1 refills | Status: DC

## 2022-08-07 MED ORDER — CEPHALEXIN 500 MG PO CAPS: 1000.0000 mg | ORAL_CAPSULE | Freq: Four times a day (QID) | ORAL | 1 refills | Status: DC

## 2022-08-07 NOTE — Progress Notes (Signed)
Pt completing Daptomycin today Rifampin was stopped on Tuesday because of itching and rash back and he is feeling much better He will start Keflex 1 gram PO Q6 and Batrim DS 1 BID starting tomorrow HE will do CBC/CMP/ESR/CRP in 2 weeks-  Discussed with patient in detail Discussed with Dr.Hooten

## 2022-08-11 ENCOUNTER — Telehealth: Payer: Self-pay

## 2022-08-11 NOTE — Telephone Encounter (Signed)
Call received from patient stating that he recently saw Dr. Delaine Lame, and wanted to relay to the provider that his PICC line has not yet been removed.  Confirmed OPAT orders with Dr. Delaine Lame per Epic secure chat, to pull PICC. End of treatment was 08/07/22.  Spoke with Carolynn Sayers and sent Epic Community message to [facility] relaying orders per Dr. Delaine Lame to remove PICC line. Patient aware of plan to remove PICC.  Routed to [facility] team.  Binnie Kand, RN

## 2022-08-12 NOTE — Telephone Encounter (Signed)
Received call today from Carolynn Sayers with [facility] regarding home health orders. States that episodes of care had expired and home health will need new orders before picc can be pulled. Maggie, Clinical manager was conference into call to take verbal order. Order for home health to visit x1 to have picc line pulled today. Maggie, Publishing copy will follow up with Rn today. Leatrice Jewels, RMA

## 2022-08-12 NOTE — Telephone Encounter (Signed)
Patient left voicemail stating he still has picc line in. Received call from home health RN who states they have order to pull picc line, but do not have order to do home visit. Spoke with Lonn Georgia at [facility] who will follow up with nursing team regarding this.  Requested patient call back if he has not heard from home health team by 12. Leatrice Jewels, RMA

## 2022-09-04 ENCOUNTER — Other Ambulatory Visit
Admission: RE | Admit: 2022-09-04 | Discharge: 2022-09-04 | Disposition: A | Discharge status: Home / Self Care | Payer: Medicare HMO | Source: Ambulatory Visit | Attending: Infectious Diseases | Admitting: Infectious Diseases

## 2022-09-04 ENCOUNTER — Encounter: Payer: Self-pay | Admitting: Infectious Diseases

## 2022-09-04 ENCOUNTER — Ambulatory Visit: Payer: Medicare HMO | Attending: Infectious Diseases | Admitting: Infectious Diseases

## 2022-09-04 VITALS — BP 121/74 | HR 74 | Temp 97.0°F | Ht 71.0 in | Wt 206.0 lb

## 2022-09-04 DIAGNOSIS — T8450XD Infection and inflammatory reaction due to unspecified internal joint prosthesis, subsequent encounter: Secondary | ICD-10-CM | POA: Insufficient documentation

## 2022-09-04 DIAGNOSIS — T8452XD Infection and inflammatory reaction due to internal left hip prosthesis, subsequent encounter: Secondary | ICD-10-CM

## 2022-09-04 DIAGNOSIS — I10 Essential (primary) hypertension: Secondary | ICD-10-CM | POA: Insufficient documentation

## 2022-09-04 DIAGNOSIS — Y828 Other medical devices associated with adverse incidents: Secondary | ICD-10-CM | POA: Insufficient documentation

## 2022-09-04 LAB — COMPREHENSIVE METABOLIC PANEL
ALT: 18 U/L (ref 0–44)
AST: 21 U/L (ref 15–41)
Albumin: 4.1 g/dL (ref 3.5–5.0)
Alkaline Phosphatase: 136 U/L — ABNORMAL HIGH (ref 38–126)
Anion gap: 8 (ref 5–15)
BUN: 16 mg/dL (ref 8–23)
CO2: 27 mmol/L (ref 22–32)
Calcium: 8.8 mg/dL — ABNORMAL LOW (ref 8.9–10.3)
Chloride: 102 mmol/L (ref 98–111)
Creatinine, Ser: 1.34 mg/dL — ABNORMAL HIGH (ref 0.61–1.24)
GFR, Estimated: 55 mL/min — ABNORMAL LOW (ref >60)
Glucose, Bld: 121 mg/dL — ABNORMAL HIGH (ref 70–99)
Potassium: 4.3 mmol/L (ref 3.5–5.1)
Sodium: 137 mmol/L (ref 135–145)
Total Bilirubin: 0.7 mg/dL (ref 0.3–1.2)
Total Protein: 7.9 g/dL (ref 6.5–8.1)

## 2022-09-04 LAB — CBC WITH DIFFERENTIAL/PLATELET
Abs Immature Granulocytes: 0.03 10*3/uL (ref 0.00–0.07)
Basophils Absolute: 0.1 10*3/uL (ref 0.0–0.1)
Basophils Relative: 1 %
Eosinophils Absolute: 0.4 10*3/uL (ref 0.0–0.5)
Eosinophils Relative: 7 %
HCT: 38.6 % — ABNORMAL LOW (ref 39.0–52.0)
Hemoglobin: 12.6 g/dL — ABNORMAL LOW (ref 13.0–17.0)
Immature Granulocytes: 1 %
Lymphocytes Relative: 24 %
Lymphs Abs: 1.6 10*3/uL (ref 0.7–4.0)
MCH: 29.9 pg (ref 26.0–34.0)
MCHC: 32.6 g/dL (ref 30.0–36.0)
MCV: 91.5 fL (ref 80.0–100.0)
Monocytes Absolute: 0.5 10*3/uL (ref 0.1–1.0)
Monocytes Relative: 8 %
Neutro Abs: 3.9 10*3/uL (ref 1.7–7.7)
Neutrophils Relative %: 59 %
Platelets: 318 10*3/uL (ref 150–400)
RBC: 4.22 MIL/uL (ref 4.22–5.81)
RDW: 13.1 % (ref 11.5–15.5)
WBC: 6.5 10*3/uL (ref 4.0–10.5)
nRBC: 0 % (ref 0.0–0.2)

## 2022-09-04 LAB — SEDIMENTATION RATE: Sed Rate: 66 mm/hr — ABNORMAL HIGH (ref 0–20)

## 2022-09-04 LAB — C-REACTIVE PROTEIN: CRP: 2.7 mg/dL — ABNORMAL HIGH (ref <1.0)

## 2022-09-04 MED ORDER — BACITRACIN 500 UNIT/GM EX OINT: 1.0000 | TOPICAL_OINTMENT | Freq: Two times a day (BID) | CUTANEOUS | 0 refills | Status: DC

## 2022-09-04 NOTE — Patient Instructions (Signed)
You are here for follow up of the left prosthetic hip joint infection- you are on kelfex and bactrim( sulfa) continue both. Will prescribe bacitracin ointment for the skin lesions on your back Will do labs today Please increase water to 6-8 glasses Flu vaccine given today Follow up PRN

## 2022-09-04 NOTE — Progress Notes (Signed)
NAME: Bryan Frey  DOB: January 27, 1946  MRN: [id number]  Date/Time: 09/04/2022 10:50 AM   Subjective:  left hip Prosthetic joint infection- follow up visit ?here with his wife    Bryan Frey is a 76 y.o. male with a history of HTN, trigeminal neuralgia on carbamazepine,  left hip hemiarthroplasty X2  last one being 2 yrs ago , lumbar fusion developed a soft blister on the surgical scar 3 weeks ago, He had no pain- the blister opened up and he had lot of discharge- He saw ortho on 8/28 and had imaging suspicious for a sinus tract- HE had culture of the fluid which grew MSSA- On 06/06/22 he underwent washout and polyeytlene capsule exchange. 3 cultures were sent- On 9/18 it was finalized as staph lugdunensis from the joint and the culture from under IT band- both these cultures have different Susceptibility- one was oxacillin susceptible and the other was oxacillin resistant, as the sample was not available for confirmation by the time I saw him he hwas started on Iv dapto and PO rifampin on 06/28/22 He completed 6 weeks of dapto on 08/07/22- Rifampin was stopped before that because  itching on his back and papulopustular eruption on the rt side of his back This has not cleared in the past 4 weeks After finishing dapto he has been on PO keflex 1 gram Q6 + bactrim DS 1 BID Today he says the discharge from the surgical site is sanguinous/bloody less than before and of the 2 holes one has closed. He was using wound vac for some time which helped dry the fluid. No pain, no side effects from current meds    Past Medical History:  Diagnosis Date   Acquired short leg syndrome on left    Anemia    vitamin b12 deficiency   Anxiety    with diagnosis   Arthritis 2013   back   DDD (degenerative disc disease), lumbar    Depression    ED (erectile dysfunction)    Gait abnormality 03/27/2017   Gall stones    High cholesterol    History of kidney stones    HOH (hard of hearing)    Hypertension     EKG,  chest  4/13 EPIC   Internuclear ophthalmoplegia    left renal ca dx'd 03/2012   tumor on kidney. removed tumor and all is clear since then   Nephrolithiasis    Neuromuscular disorder ([place])    facial nerve pain   Spinal stenosis of lumbar region    with neurogenic claudication   Stroke ([place]) 2010   heat stroke d/t a hot day and working under a house.no problems after   Trigeminal neuralgia     Past Surgical History:  Procedure Laterality Date   BACK SURGERY     CATARACT EXTRACTION W/PHACO Left 12/24/2017   Procedure: CATARACT EXTRACTION PHACO AND INTRAOCULAR LENS PLACEMENT (IOC);  Surgeon: Eulogio Bear, MD;  Location: [facility];  Service: Ophthalmology;  Laterality: Left;  Lot # [id number] H [place]: 00:30.4 AP%: 9.4 CDE: 2.84   CATARACT EXTRACTION W/PHACO Right 01/26/2018   Procedure: CATARACT EXTRACTION PHACO AND INTRAOCULAR LENS PLACEMENT (IOC) RIGHT;  Surgeon: Eulogio Bear, MD;  Location: [facility];  Service: Ophthalmology;  Laterality: Right;   COLONOSCOPY     COLONOSCOPY  12/03/2020   Procedure: COLONOSCOPY;  Surgeon: Toledo, Benay Pike, MD;  Location: [facility];  Service: Gastroenterology;;   EYE SURGERY Bilateral    cataract extra tions  INCISION AND DRAINAGE HIP Left 06/06/2022   Procedure: IRRIGATION AND DEBRIDEMENT OF LEFT HIP WITH FEMORAL HEAD EXCHANGE;  Surgeon: Dereck Leep, MD;  Location: [facility];  Service: Orthopedics;  Laterality: Left;   IR RADIOLOGIST EVAL & MGMT  03/18/2017   JOINT REPLACEMENT Bilateral    left hip replaced x 2, right hip x 1   Left total hip arthroplasty  08/07/2010   LUMBAR LAMINECTOMY/DECOMPRESSION MICRODISCECTOMY  02/09/2012   Procedure: LUMBAR LAMINECTOMY/DECOMPRESSION MICRODISCECTOMY 2 LEVELS;  Surgeon: Kristeen Miss, MD;  Location: [facility];  Service: Neurosurgery;  Laterality: Bilateral;  Bilateral Lumbar two-three,lumbar four-five laminectomies   NEPHROLITHOTOMY Right 02/09/2014   Procedure: NEPHROLITHOTOMY  PERCUTANEOUS;  Surgeon: Franchot Gallo, MD;  Location: [facility];  Service: Urology;  Laterality: Right;   Right total hip arthroplasty  07/13/2014   TOTAL HIP REVISION Left 05/14/2015   Procedure: TOTAL HIP REVISION;  Surgeon: Dereck Leep, MD;  Location: [facility];  Service: Orthopedics;  Laterality: Left;   TOTAL HIP REVISION Left 04/06/2020   Procedure: TOTAL HIP REVISION;  Surgeon: Dereck Leep, MD;  Location: [facility];  Service: Orthopedics;  Laterality: Left;    Social History   Socioeconomic History   Marital status: Married    Spouse name: Terri   Number of children: 2   Years of education: 16   Highest education level: Not on file  Occupational History   Occupation: Engineer, structural    Comment: retired  Tobacco Use   Smoking status: Never   Smokeless tobacco: Never  Vaping Use   Vaping Use: Never used  Substance and Sexual Activity   Alcohol use: No   Drug use: No    Types: Other-see comments    Comment: tramadol   Sexual activity: Not Currently  Other Topics Concern   Not on file  Social History Narrative   Lives with wife   Caffeine use: Drinks coffee/tea/soda occassionally.   Right handed   Social Determinants of Health   Financial Resource Strain: Not on file  Food Insecurity: Not on file  Transportation Needs: Not on file  Physical Activity: Not on file  Stress: Not on file  Social Connections: Not on file  Intimate Partner Violence: Not on file    Family History  Problem Relation Age of Onset   Anesthesia problems Neg Hx    Allergies  Allergen Reactions   Morphine And Related Other (See Comments)    Hallucinations   Statins     Other reaction(s): Muscle Pain Other reaction(s): Muscle Pain   I? Current Outpatient Medications  Medication Sig Dispense Refill   acetaminophen (TYLENOL) 500 MG tablet Take 1,000 mg by mouth daily.     amLODipine (NORVASC) 10 MG tablet Take 10 mg by mouth daily.      carbamazepine (TEGRETOL) 200 MG tablet Take 400  mg by mouth 3 (three) times daily.      cephALEXin (KEFLEX) 500 MG capsule Take 2 capsules (1,000 mg total) by mouth 4 (four) times daily. 240 capsule 1   diphenhydramine-acetaminophen (TYLENOL PM) 25-500 MG TABS Take 2 tablets by mouth at bedtime.     DULoxetine (CYMBALTA) 60 MG capsule Take 60 mg by mouth 2 (two) times daily.     hydrochlorothiazide (HYDRODIURIL) 25 MG tablet Take 25 mg by mouth daily.      Melatonin 5 MG CAPS Take 5 mg by mouth at bedtime.     sulfamethoxazole-trimethoprim (BACTRIM DS) 800-160 MG tablet Take 1 tablet by mouth 2 (two)  times daily. 60 tablet 1   tamsulosin (FLOMAX) 0.4 MG CAPS capsule Take 1 capsule (0.4 mg total) by mouth daily. 30 capsule 1   traMADol (ULTRAM) 50 MG tablet Take 1 tablet (50 mg total) by mouth every 4 (four) hours as needed for moderate pain. 30 tablet 0   vitamin B-12 (CYANOCOBALAMIN) 1000 MCG tablet Take 1,000 mcg by mouth daily.     No current facility-administered medications for this visit.     Abtx:  Anti-infectives (From admission, onward)    None       REVIEW OF SYSTEMS:  Const: negative fever, negative chills, negative weight loss Eyes: negative diplopia or visual changes, negative eye pain ENT: negative coryza, negative sore throat Resp: cough, no dysonea, had CXR 2 weeks ago and was normal Cards: negative for chest pain, palpitations, lower extremity edema GU: negative for frequency, dysuria and hematuria GI: Negative for abdominal pain, diarrhea, bleeding, constipation Skin: as above Heme: negative for easy bruising and gum/nose bleeding MS: left hip discharge, walks with cane Neurolo:negative for headaches, dizziness, vertigo, memory problems  Psych: negative for feelings of anxiety, depression  Endocrine: negative for thyroid, diabetes Allergy/Immunology- as above  Objective:  VITALS:  BP 121/74   Pulse 74   Temp (!) 97 F (36.1 C) (Temporal)   Ht _0  (1.803 m)   Wt 206 lb (93.4 kg)   BMI 28.73 kg/m    PHYSICAL EXAM:  General: looks well.  Head: Normocephalic, without obvious abnormality, atraumatic. Lungs: Clear to auscultation bilaterally. No Wheezing or Rhonchi. No rales. Heart: Regular rate and rhythm, no murmur, rub or gallop. Abdomen: Soft, non-tender,not distended. Bowel sounds normal. No masses Extremities: left upper thigh lateral aspect there is surgical scar- no dehiscence- but there is 1 pin hole which is discharging sanguinous/bloody fluid      Skin:papular eruption Lymph: Cervical, supraclavicular normal. Neurologic: Grossly non-focal Pertinent Labs ESR-66 CRP-2.7   Microbiology: 06/06/22 Wound culture from left hip joint staph lugdunensis - S to oxacillin 9/1 culture from soft tissue- Staph L R to oxacillin  06/26/22  ? 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 Rifampin stopped Rifampin on 10/31 due to rash He  completed 6 weeks of dapto  on 08/07/22, but he still has some  drainage from 1 hole. The other one has closed- He is currently on Keflex and bactrim started 11/3, PICC removed 11/7 ESR still 66 and crp 2.4  Will do Cmp today Discussed with patient, and his wife    Discussed with Dr.Hooten Plan is to continue antibiotics for total of 6 months and then be on indefinite antibiotic? ___________________________________________________ P.S Cr 1.34- will let the patient to increase water intake and retest in a few weeks Note:  This document was prepared using Dragon voice recognition software and may include unintentional dictation errors.

## 2022-09-12 ENCOUNTER — Telehealth: Payer: Self-pay

## 2022-09-12 NOTE — Telephone Encounter (Signed)
Called patient and relayed results verbatim per provider, "kidney function creatinine is borderline impaired, and while taking the antibiotics he will have to drink plenty of water- 6-8 glasses a day "  Patient verbalized understanding.  Patient stated that he feels the drainage from the surgical scar has improved, but it has not resolved. Stated no change in drainage color, but that volume present on gauze is a little less when they are changed. Denies pain, fever, and chills.  States he had 3 wounds, two of which have closed, but the last wound at the bottom is "still functioning". States he saw his surgeon, Dr. Marry Guan, yesterday.  Message routed to provider.  Binnie Kand, RN

## 2022-09-12 NOTE — Telephone Encounter (Signed)
-----   Message from Tsosie Billing, MD sent at 09/12/2022 10:18 AM EST ----- Please let the patient know that the kidney function creatinine is borderline impaired, and while taking the antibiotics he will have to drink plenty of water- 6-8 glasses a day. Please find out whether the drainage from the surgical scar has resolved?or improved? thx ----- Message ----- From: Buel Ream, [facility] In [place] Sent: 09/04/2022  11:48 AM EST To: Tsosie Billing, MD

## 2022-09-15 ENCOUNTER — Telehealth: Payer: Self-pay

## 2022-09-15 NOTE — Telephone Encounter (Signed)
I spoke to the patient and advised him of lab results. Patient also advised to drink 6-8 glasses of water a day due to him being on the antibiotics and his Kidney function. Patient verbalized understanding. Patient stated the drainage has improved at the surgical scar. Bryan Frey

## 2022-09-15 NOTE — Telephone Encounter (Signed)
-----   Message from Tsosie Billing, MD sent at 09/12/2022 10:18 AM EST ----- Please let the patient know that the kidney function creatinine is borderline impaired, and while taking the antibiotics he will have to drink plenty of water- 6-8 glasses a day. Please find out whether the drainage from the surgical scar has resolved?or improved? thx ----- Message ----- From: Buel Ream, Lab In [place] Sent: 09/04/2022  11:48 AM EST To: Tsosie Billing, MD

## 2022-11-17 ENCOUNTER — Other Ambulatory Visit: Payer: Self-pay | Admitting: Infectious Diseases

## 2022-11-17 ENCOUNTER — Telehealth: Payer: Self-pay

## 2022-11-17 MED ORDER — DOXYCYCLINE HYCLATE 100 MG PO TABS: 100.0000 mg | ORAL_TABLET | Freq: Two times a day (BID) | ORAL | 1 refills | Status: DC

## 2022-11-17 NOTE — Progress Notes (Signed)
Pt is currently on keflex and bactrim for PJI hip Has a rash on his back - getting worse- will stop both antibiotics and give him doxy 173m PO BID Asked him to take a picture Gave an appt to see me on 11/27/22 at 10.30 am

## 2022-11-17 NOTE — Telephone Encounter (Signed)
Patient called office today stating he is having rash on his back that is getting worse. States that rash appeared around two weeks ago. Has been getting worse. Rash is only on one side of back and is starting to develop small blisters that bleed after being touched.  Denies any SOB, Difficulty breathing. Is not taking anything for allergies.  Updated provider who will get in touch with patient.  Patient is requesting alternative to Bactrim. Is currently out of this. Took last dose yesterday. Leatrice Jewels, RMA

## 2022-11-27 ENCOUNTER — Ambulatory Visit: Payer: Medicare HMO | Attending: Infectious Diseases | Admitting: Infectious Diseases

## 2022-11-27 ENCOUNTER — Other Ambulatory Visit
Admission: RE | Admit: 2022-11-27 | Discharge: 2022-11-27 | Disposition: A | Discharge status: Home / Self Care | Payer: Medicare HMO | Source: Ambulatory Visit | Attending: Infectious Diseases | Admitting: Infectious Diseases

## 2022-11-27 ENCOUNTER — Encounter: Payer: Self-pay | Admitting: Infectious Diseases

## 2022-11-27 VITALS — BP 116/62 | HR 67 | Temp 97.5°F | Ht 71.0 in | Wt 209.0 lb

## 2022-11-27 DIAGNOSIS — I10 Essential (primary) hypertension: Secondary | ICD-10-CM | POA: Insufficient documentation

## 2022-11-27 DIAGNOSIS — L739 Follicular disorder, unspecified: Secondary | ICD-10-CM | POA: Insufficient documentation

## 2022-11-27 NOTE — Patient Instructions (Addendum)
You are here for follow up of the left hip infeciton- you are currently on Doxycycline- will do an aerobic culture from the back rash today- apply bacitracin/triple antibiotic. Follow

## 2022-11-27 NOTE — Progress Notes (Signed)
NAME: Bryan Frey  DOB: Feb 27, 1946  MRN: [id number]  Date/Time: 11/27/2022 11:17 AM   Subjective:  left hip Prosthetic joint infection- follow up visit ?here with his wife    Bryan Frey is a 77 y.o. male with a history of HTN, trigeminal neuralgia on carbamazepine,  left hip hemiarthroplasty X2  last one being 2 yrs ago , lumbar fusion developed a soft blister on the surgical scar 3 weeks ago, He had no pain- the blister opened up and he had lot of discharge- He saw ortho on 8/28 and had imaging suspicious for a sinus tract- HE had culture of the fluid which grew MSSA- On 06/06/22 he underwent washout and polyeytlene capsule exchange. 3 cultures were sent- On 9/18 it was finalized as staph lugdunensis from the joint and the culture from under IT band- both these cultures have different Susceptibility- one was oxacillin susceptible and the other was oxacillin resistant, as the sample was not available for confirmation by the time I saw him he was started on Iv dapto and PO rifampin on 06/28/22 He completed 6 weeks of dapto on 08/07/22- Rifampin was stopped before that because  itching on his back and papulopustular eruption on the rt side of his back This has not cleared  After finishing dapto he went  on PO keflex 1 gram Q6 + bactrim DS 1 BID, which was changed to Doxycycline Feb 2nd week because of rash back He continues to have discharge from the sinus, and the amount varies- some days less Last ESR has improved to 31 and crp 9 ( < 10 N)   Past Medical History:  Diagnosis Date   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 removed tumor and all is clear since then   Nephrolithiasis    Neuromuscular disorder ([place])    facial nerve pain   Spinal stenosis of lumbar region    with neurogenic claudication   Stroke ([place]) 2010   heat stroke d/t a hot day and working under a house.no problems after   Trigeminal neuralgia     Past Surgical History:  Procedure Laterality Date   BACK SURGERY     CATARACT EXTRACTION W/PHACO Left 12/24/2017   Procedure: CATARACT EXTRACTION PHACO AND INTRAOCULAR LENS PLACEMENT (IOC);  Surgeon: Eulogio Bear, MD;  Location: [facility];  Service: Ophthalmology;  Laterality: Left;  Lot # [id number] H [place]: 00:30.4 AP%: 9.4 CDE: 2.84   CATARACT EXTRACTION W/PHACO Right 01/26/2018   Procedure: CATARACT EXTRACTION PHACO AND INTRAOCULAR LENS PLACEMENT (IOC) RIGHT;  Surgeon: Eulogio Bear, MD;  Location: [facility];  Service: Ophthalmology;  Laterality: Right;   COLONOSCOPY     COLONOSCOPY  12/03/2020   Procedure: COLONOSCOPY;  Surgeon: Toledo, Benay Pike, MD;  Location: [facility];  Service: Gastroenterology;;   EYE SURGERY Bilateral    cataract extra tions   INCISION AND DRAINAGE HIP Left 06/06/2022  Procedure: IRRIGATION AND DEBRIDEMENT OF LEFT HIP WITH FEMORAL HEAD EXCHANGE;  Surgeon: Dereck Leep, MD;  Location: [facility];  Service: Orthopedics;  Laterality: Left;   IR RADIOLOGIST EVAL & MGMT  03/18/2017   JOINT REPLACEMENT Bilateral    left hip replaced x 2, right hip x 1   Left total hip arthroplasty  08/07/2010   LUMBAR LAMINECTOMY/DECOMPRESSION MICRODISCECTOMY  02/09/2012   Procedure: LUMBAR LAMINECTOMY/DECOMPRESSION MICRODISCECTOMY 2 LEVELS;  Surgeon: Kristeen Miss, MD;  Location: [facility];  Service: Neurosurgery;  Laterality: Bilateral;  Bilateral Lumbar two-three,lumbar four-five laminectomies   NEPHROLITHOTOMY Right 02/09/2014   Procedure: NEPHROLITHOTOMY PERCUTANEOUS;  Surgeon: Franchot Gallo, MD;   Location: [facility];  Service: Urology;  Laterality: Right;   Right total hip arthroplasty  07/13/2014   TOTAL HIP REVISION Left 05/14/2015   Procedure: TOTAL HIP REVISION;  Surgeon: Dereck Leep, MD;  Location: [facility];  Service: Orthopedics;  Laterality: Left;   TOTAL HIP REVISION Left 04/06/2020   Procedure: TOTAL HIP REVISION;  Surgeon: Dereck Leep, MD;  Location: [facility];  Service: Orthopedics;  Laterality: Left;    Social History   Socioeconomic History   Marital status: Married    Spouse name: Terri   Number of children: 2   Years of education: 16   Highest education level: Not on file  Occupational History   Occupation: Engineer, structural    Comment: retired  Tobacco Use   Smoking status: Never   Smokeless tobacco: Never  Vaping Use   Vaping Use: Never used  Substance and Sexual Activity   Alcohol use: No   Drug use: No    Types: Other-see comments    Comment: tramadol   Sexual activity: Not Currently  Other Topics Concern   Not on file  Social History Narrative   Lives with wife   Caffeine use: Drinks coffee/tea/soda occassionally.   Right handed   Social Determinants of Health   Financial Resource Strain: Not on file  Food Insecurity: Not on file  Transportation Needs: Not on file  Physical Activity: Not on file  Stress: Not on file  Social Connections: Not on file  Intimate Partner Violence: Not on file    Family History  Problem Relation Age of Onset   Anesthesia problems Neg Hx    Allergies  Allergen Reactions   Morphine And Related Other (See Comments)    Hallucinations   Statins     Other reaction(s): Muscle Pain Other reaction(s): Muscle Pain   I? Current Outpatient Medications  Medication Sig Dispense Refill   acetaminophen (TYLENOL) 500 MG tablet Take 1,000 mg by mouth daily.     amLODipine (NORVASC) 10 MG tablet Take 10 mg by mouth daily.      bacitracin 500 UNIT/GM ointment Apply 1 Application topically 2 (two) times daily. 15 g 0    carbamazepine (TEGRETOL) 200 MG tablet Take 400 mg by mouth 3 (three) times daily.      diphenhydramine-acetaminophen (TYLENOL PM) 25-500 MG TABS Take 2 tablets by mouth at bedtime.     doxycycline (VIBRA-TABS) 100 MG tablet Take 1 tablet (100 mg total) by mouth 2 (two) times daily. 60 tablet 1   DULoxetine (CYMBALTA) 60 MG capsule Take 60 mg by mouth 2 (two) times daily.     hydrochlorothiazide (HYDRODIURIL) 25 MG tablet Take 25 mg by mouth daily.      Melatonin 5 MG CAPS Take 5 mg by mouth at bedtime.     tamsulosin (FLOMAX) 0.4 MG  CAPS capsule Take 1 capsule (0.4 mg total) by mouth daily. 30 capsule 1   traMADol (ULTRAM) 50 MG tablet Take 1 tablet (50 mg total) by mouth every 4 (four) hours as needed for moderate pain. 30 tablet 0   vitamin B-12 (CYANOCOBALAMIN) 1000 MCG tablet Take 1,000 mcg by mouth daily.     No current facility-administered medications for this visit.     Abtx:  Anti-infectives (From admission, onward)    None       REVIEW OF SYSTEMS:  Const: negative fever, negative chills, negative weight loss Eyes: negative diplopia or visual changes, negative eye pain ENT: negative coryza, negative sore throat Resp: cough, no dysonea, had CXR 2 weeks ago and was normal Cards: negative for chest pain, palpitations, lower extremity edema GU: negative for frequency, dysuria and hematuria GI: Negative for abdominal pain, diarrhea, bleeding, constipation Skin: as above Heme: negative for easy bruising and gum/nose bleeding MS: left hip discharge, walks with cane Neurolo:negative for headaches, dizziness, vertigo, memory problems  Psych: negative for feelings of anxiety, depression  Endocrine: negative for thyroid, diabetes Allergy/Immunology- as above  Objective:  VITALS:  BP 116/62   Pulse 67   Temp (!) 97.5 F (36.4 C) (Temporal)   Ht 5' 11"$  (1.803 m)   Wt 209 lb (94.8 kg)   BMI 29.15 kg/m   PHYSICAL EXAM:  General: No distress , well Head: Normocephalic,  without obvious abnormality, atraumatic. Lungs: Clear to auscultation bilaterally. No Wheezing or Rhonchi. No rales. Heart: Regular rate and rhythm, no murmur, rub or gallop. Abdomen: Soft, non-tender,not distended. Bowel sounds normal. No masses Extremities: left upper thigh lateral aspect there is surgical scar- no dehiscence- but there is 1 pin hole which is discharging sanguinous/bloody fluid Back papulopustular eruption still present 11/27/22    09/04/22   Lymph: Cervical, supraclavicular normal. Neurologic: Grossly non-focal Pertinent Labs ESR-66>31 CRP-2.7>N   Microbiology: 06/03/22- Staph aureus 06/06/22 surgical culture Wound culture from left hip joint staph lugdunensis - S to oxacillin 9/1 culture from soft tissue- Staph L R to oxacillin  06/26/22- left hip fluid culture NG 08/05/22- L Hip fluid culture NG  ? 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 Rifampin stopped Rifampin on 10/31 due to rash He  completed 6 weeks of dapto  on 08/07/22, but he still has some  drainage from 1 hole. The other one has closed- He was placed  on Keflex and bactrim started 11/3, PICC removed 11/7. Both antibiotics DC on 11/17/22 because there was concern for rash and it was changed to Po doxy 130m BID ESR from 2/1 improved at 31 And Crp N  Papulo pustular rash on back- present since Nov and doubt this is any antibiotic allergy ? Acne\? Folliculitis - today will check culture Continue Doxy Will repeat ESR/CRP in March  Discussed with Dr.Hooten  Mildly elevated cr at 1.4  Discussed the management with patient and his wife

## 2022-11-30 LAB — AEROBIC CULTURE W GRAM STAIN (SUPERFICIAL SPECIMEN)

## 2022-12-16 ENCOUNTER — Telehealth: Payer: Self-pay

## 2022-12-16 NOTE — Telephone Encounter (Signed)
Patient called stating that he doesn't feel that Doxycyline is working. Patient is still having discharge from left hip - not as much but it is still leaking. Patient wanted to know if he needed to keep taking medication or if he needed to be switched or make an appointment.    [place], CMA

## 2022-12-29 ENCOUNTER — Telehealth: Payer: Self-pay

## 2022-12-29 NOTE — Telephone Encounter (Signed)
Patient left voicemail stating he recently had labs done with PCP and that everything was within normal limits except alkaline phosphatase which was 164. Reports his temperatures have been normal. He would like to know if he needs to keep his follow up appointment with Dr. Delaine Lame this Thursday.   Beryle Flock, RN

## 2022-12-30 NOTE — Telephone Encounter (Signed)
Patient called in regards to upcoming appt. States that he saw his PCP on Monday and was told by his PCP that labs looked good and did not feel like pt would need to be seen. Informed patient that PCP did not do labs that Dr. Delaine Lame was looking for.  Patient would like to know if he can just do labs and follow up pending labs.  Leatrice Jewels, RMA

## 2022-12-30 NOTE — Telephone Encounter (Signed)
Patient still having discharge. Informed him provider would like to see him to do cultures. Verbalized understanding.Leatrice Jewels, RMA

## 2023-01-01 ENCOUNTER — Ambulatory Visit: Payer: Medicare HMO | Attending: Infectious Diseases | Admitting: Infectious Diseases

## 2023-01-01 ENCOUNTER — Other Ambulatory Visit
Admission: RE | Admit: 2023-01-01 | Discharge: 2023-01-01 | Disposition: A | Discharge status: Home / Self Care | Payer: Medicare HMO | Source: Ambulatory Visit | Attending: Infectious Diseases | Admitting: Infectious Diseases

## 2023-01-01 ENCOUNTER — Encounter: Payer: Self-pay | Admitting: Infectious Diseases

## 2023-01-01 VITALS — BP 149/79 | HR 70 | Temp 97.0°F | Ht 71.0 in | Wt 211.0 lb

## 2023-01-01 DIAGNOSIS — Y828 Other medical devices associated with adverse incidents: Secondary | ICD-10-CM | POA: Insufficient documentation

## 2023-01-01 DIAGNOSIS — T8450XD Infection and inflammatory reaction due to unspecified internal joint prosthesis, subsequent encounter: Secondary | ICD-10-CM | POA: Diagnosis not present

## 2023-01-01 DIAGNOSIS — T8450XA Infection and inflammatory reaction due to unspecified internal joint prosthesis, initial encounter: Secondary | ICD-10-CM | POA: Diagnosis present

## 2023-01-01 DIAGNOSIS — T8452XD Infection and inflammatory reaction due to internal left hip prosthesis, subsequent encounter: Secondary | ICD-10-CM | POA: Diagnosis not present

## 2023-01-01 LAB — COMPREHENSIVE METABOLIC PANEL
ALT: 25 U/L (ref 0–44)
AST: 22 U/L (ref 15–41)
Albumin: 4.1 g/dL (ref 3.5–5.0)
Alkaline Phosphatase: 128 U/L — ABNORMAL HIGH (ref 38–126)
Anion gap: 9 (ref 5–15)
BUN: 21 mg/dL (ref 8–23)
CO2: 29 mmol/L (ref 22–32)
Calcium: 8.9 mg/dL (ref 8.9–10.3)
Chloride: 102 mmol/L (ref 98–111)
Creatinine, Ser: 1.07 mg/dL (ref 0.61–1.24)
GFR, Estimated: >60 mL/min (ref >60)
Glucose, Bld: 111 mg/dL — ABNORMAL HIGH (ref 70–99)
Potassium: 4.5 mmol/L (ref 3.5–5.1)
Sodium: 140 mmol/L (ref 135–145)
Total Bilirubin: 0.7 mg/dL (ref 0.3–1.2)
Total Protein: 7.2 g/dL (ref 6.5–8.1)

## 2023-01-01 LAB — CBC WITH DIFFERENTIAL/PLATELET
Abs Immature Granulocytes: 0.02 10*3/uL (ref 0.00–0.07)
Basophils Absolute: 0 10*3/uL (ref 0.0–0.1)
Basophils Relative: 1 %
Eosinophils Absolute: 0.6 10*3/uL — ABNORMAL HIGH (ref 0.0–0.5)
Eosinophils Relative: 7 %
HCT: 39.4 % (ref 39.0–52.0)
Hemoglobin: 12.7 g/dL — ABNORMAL LOW (ref 13.0–17.0)
Immature Granulocytes: 0 %
Lymphocytes Relative: 20 %
Lymphs Abs: 1.6 10*3/uL (ref 0.7–4.0)
MCH: 31 pg (ref 26.0–34.0)
MCHC: 32.2 g/dL (ref 30.0–36.0)
MCV: 96.1 fL (ref 80.0–100.0)
Monocytes Absolute: 0.7 10*3/uL (ref 0.1–1.0)
Monocytes Relative: 9 %
Neutro Abs: 5.1 10*3/uL (ref 1.7–7.7)
Neutrophils Relative %: 63 %
Platelets: 282 10*3/uL (ref 150–400)
RBC: 4.1 MIL/uL — ABNORMAL LOW (ref 4.22–5.81)
RDW: 12.8 % (ref 11.5–15.5)
WBC: 8 10*3/uL (ref 4.0–10.5)
nRBC: 0 % (ref 0.0–0.2)

## 2023-01-01 LAB — C-REACTIVE PROTEIN: CRP: 0.9 mg/dL (ref <1.0)

## 2023-01-01 LAB — CK: Total CK: 113 U/L (ref 49–397)

## 2023-01-01 LAB — SEDIMENTATION RATE: Sed Rate: 35 mm/hr — ABNORMAL HIGH (ref 0–20)

## 2023-01-01 NOTE — Patient Instructions (Signed)
You are here for follow up of the left hip joint infection- you are off antibiotics Will do ESR/CRP today and will discuss with Dr.Hooten

## 2023-01-01 NOTE — Progress Notes (Signed)
NAME: Bryan Frey  DOB: 1945-10-07  MRN: [id number]  Date/Time: 01/01/2023 10:46 AM   Subjective:  left hip Prosthetic joint infection- follow up visit ?here with his wife  2 weeks ago we stopped his antibiotics because it was not really helping him and he was wondering whether that was the cause of his rash on the left side of his back for many months. In spite of stopping doxycycline the rash which looks more like acne has not resolved.  Also patient's discharge from the left hip has not changed much except some days it can be a little bit more reddish.  He has not had any fever.  He did keep a temperature log and it has been running fine He is here today to do some labs and also get culture from the discharge. The following is taken from the records last visit  Bryan Frey Frey is a 77 y.o. male with a history of HTN, trigeminal neuralgia on carbamazepine,  left hip hemiarthroplasty X2  last one being 2 yrs ago , lumbar fusion developed a soft blister on the surgical scar 3 weeks ago, He had no pain- the blister opened up and he had lot of discharge- He saw ortho on 8/28 and had imaging suspicious for a sinus tract- HE had culture of the fluid which grew MSSA- On 06/06/22 he underwent washout and polyeytlene capsule exchange. 3 cultures were sent- On 9/18 it was finalized as staph lugdunensis from the joint and the culture from under IT band- both these cultures have different Susceptibility- one was oxacillin susceptible and the other was oxacillin resistant, as the sample was not available for confirmation by the time I saw him he was started on Iv dapto and PO rifampin on 06/28/22 He completed 6 weeks of dapto on 08/07/22- Rifampin was stopped before that because  itching on his back and papulopustular eruption on the rt side of his back This has not cleared  After finishing dapto he went  on PO keflex 1 gram Q6 + bactrim DS 1 BID, which was changed to Doxycycline Feb 2nd week because of  rash back On doxycycline was discontinued on 12/16/2022 because of persistent discharge and him monitoring of the doxycycline was causing the rash on his back even though he has had this unilateral acneform like lesions on the right side since 6 months.    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 removed tumor and all is clear since then   Nephrolithiasis    Neuromuscular disorder ([place])    facial nerve pain   Spinal stenosis of lumbar region    with neurogenic claudication   Stroke ([place]) 2010   heat stroke d/t a hot day and working under a house.no problems after   Trigeminal neuralgia     Past Surgical History:  Procedure Laterality Date   BACK  SURGERY     CATARACT EXTRACTION W/PHACO Left 12/24/2017   Procedure: CATARACT EXTRACTION PHACO AND INTRAOCULAR LENS PLACEMENT (IOC);  Surgeon: Eulogio Bear, MD;  Location: [facility];  Service: Ophthalmology;  Laterality: Left;  Lot # [id number] H [place]: 00:30.4 AP%: 9.4 CDE: 2.84   CATARACT EXTRACTION W/PHACO Right 01/26/2018   Procedure: CATARACT EXTRACTION PHACO AND INTRAOCULAR LENS PLACEMENT (IOC) RIGHT;  Surgeon: Eulogio Bear, MD;  Location: [facility];  Service: Ophthalmology;  Laterality: Right;   COLONOSCOPY     COLONOSCOPY  12/03/2020   Procedure: COLONOSCOPY;  Surgeon: Toledo, Benay Pike, MD;  Location: [facility];  Service: Gastroenterology;;   EYE SURGERY Bilateral    cataract extra tions   INCISION AND DRAINAGE HIP Left 06/06/2022   Procedure: IRRIGATION AND  DEBRIDEMENT OF LEFT HIP WITH FEMORAL HEAD EXCHANGE;  Surgeon: Dereck Leep, MD;  Location: [facility];  Service: Orthopedics;  Laterality: Left;   IR RADIOLOGIST EVAL & MGMT  03/18/2017   JOINT REPLACEMENT Bilateral    left hip replaced x 2, right hip x 1   Left total hip arthroplasty  08/07/2010   LUMBAR LAMINECTOMY/DECOMPRESSION MICRODISCECTOMY  02/09/2012   Procedure: LUMBAR LAMINECTOMY/DECOMPRESSION MICRODISCECTOMY 2 LEVELS;  Surgeon: Kristeen Miss, MD;  Location: [facility];  Service: Neurosurgery;  Laterality: Bilateral;  Bilateral Lumbar two-three,lumbar four-five laminectomies   NEPHROLITHOTOMY Right 02/09/2014   Procedure: NEPHROLITHOTOMY PERCUTANEOUS;  Surgeon: Franchot Gallo, MD;  Location: [facility];  Service: Urology;  Laterality: Right;   Right total hip arthroplasty  07/13/2014   TOTAL HIP REVISION Left 05/14/2015   Procedure: TOTAL HIP REVISION;  Surgeon: Dereck Leep, MD;  Location: [facility];  Service: Orthopedics;  Laterality: Left;   TOTAL HIP REVISION Left 04/06/2020   Procedure: TOTAL HIP REVISION;  Surgeon: Dereck Leep, MD;  Location: [facility];  Service: Orthopedics;  Laterality: Left;    Social History   Socioeconomic History   Marital status: Married    Spouse name: Terri   Number of children: 2   Years of education: 16   Highest education level: Not on file  Occupational History   Occupation: Engineer, structural    Comment: retired  Tobacco Use   Smoking status: Never   Smokeless tobacco: Never  Vaping Use   Vaping Use: Never used  Substance and Sexual Activity   Alcohol use: No   Drug use: No    Types: Other-see comments    Comment: tramadol   Sexual activity: Not Currently  Other Topics Concern   Not on file  Social History Narrative   Lives with wife   Caffeine use: Drinks coffee/tea/soda occassionally.   Right handed   Social Determinants of Health   Financial Resource Strain: Not on file  Food Insecurity: Not on file  Transportation Needs: Not on  file  Physical Activity: Not on file  Stress: Not on file  Social Connections: Not on file  Intimate Partner Violence: Not on file    Family History  Problem Relation Age of Onset   Anesthesia problems Neg Hx    Allergies  Allergen Reactions   Morphine And Related Other (See Comments)    Hallucinations   Statins     Other reaction(s): Muscle Pain Other reaction(s): Muscle Pain   I? Current Outpatient Medications  Medication Sig Dispense Refill   acetaminophen (TYLENOL) 500 MG tablet Take 1,000 mg by mouth daily.     amLODipine (NORVASC) 10 MG tablet Take 10 mg by mouth daily.  bacitracin 500 UNIT/GM ointment Apply 1 Application topically 2 (two) times daily. 15 g 0   carbamazepine (TEGRETOL) 200 MG tablet Take 400 mg by mouth 3 (three) times daily.      diphenhydramine-acetaminophen (TYLENOL PM) 25-500 MG TABS Take 2 tablets by mouth at bedtime.     DULoxetine (CYMBALTA) 60 MG capsule Take 60 mg by mouth 2 (two) times daily.     hydrochlorothiazide (HYDRODIURIL) 25 MG tablet Take 25 mg by mouth daily.      Melatonin 5 MG CAPS Take 5 mg by mouth at bedtime.     tamsulosin (FLOMAX) 0.4 MG CAPS capsule Take 1 capsule (0.4 mg total) by mouth daily. 30 capsule 1   traMADol (ULTRAM) 50 MG tablet Take 1 tablet (50 mg total) by mouth every 4 (four) hours as needed for moderate pain. 30 tablet 0   vitamin B-12 (CYANOCOBALAMIN) 1000 MCG tablet Take 1,000 mcg by mouth daily.     No current facility-administered medications for this visit.     Abtx:  Anti-infectives (From admission, onward)    None       REVIEW OF SYSTEMS:  Const: negative fever, negative chills, negative weight loss Eyes: negative diplopia or visual changes, negative eye pain ENT: negative coryza, negative sore throat Resp: cough, no dysonea, had CXR 2 weeks ago and was normal Cards: negative for chest pain, palpitations, lower extremity edema GU: negative for frequency, dysuria and hematuria GI: Negative  for abdominal pain, diarrhea, bleeding, constipation Skin: as above Heme: negative for easy bruising and gum/nose bleeding MS: left hip discharge, walks with cane Neurolo:negative for headaches, dizziness, vertigo, memory problems  Psych: negative for feelings of anxiety, depression  Endocrine: negative for thyroid, diabetes Allergy/Immunology- as above  Objective:  VITALS:  BP (!) 149/79   Pulse 70   Temp (!) 97 F (36.1 C) (Temporal)   Ht 5\' 11"  (1.803 m)   Wt 211 lb (95.7 kg)   BMI 29.43 kg/m   PHYSICAL EXAM:  General: No distress , well Head: Normocephalic, without obvious abnormality, atraumatic. Lungs: Clear to auscultation bilaterally. No Wheezing or Rhonchi. No rales. Heart: Regular rate and rhythm, no murmur, rub or gallop. Abdomen: Soft, non-tender,not distended. Bowel sounds normal. No masses Extremities: left upper thigh lateral aspect there is surgical scar- no dehiscence- but there is 1 pin hole which is discharging sanguinous/bloody fluid Some fibrinous strings attached to opening   01/01/23 Acneform eruptions persist on the right side       11/27/22    09/04/22   Lymph: Cervical, supraclavicular normal. Neurologic: Grossly non-focal Pertinent Labs ESR-66>31 CRP-2.7>N   Microbiology: 06/03/22- Staph aureus 06/06/22 surgical culture Wound culture from left hip joint staph lugdunensis - S to oxacillin 9/1 culture from soft tissue- Staph L R to oxacillin  06/26/22- left hip fluid culture NG 08/05/22- L Hip fluid culture NG  ? 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 Rifampin stopped Rifampin on 10/31 due to rash He  completed 6 weeks of dapto  on 08/07/22, but he still has some  drainage  from 1 hole. The other one has closed- He was placed  on Keflex and bactrim started 11/3, PICC removed 11/7. Both antibiotics DC on 11/17/22 because there was concern for rash and it was changed  to Po doxy 100mg  BID ESR from 2/1 improved at 31 And Crp N Doxy stopped on 12/16/2022 after nearly 6 months of antibiotics.  As patient wanted to stop and see how he would do.  Continue to hold off antibiotics.Will repeat ESR/CRP  Sent culture from the hip sinus  Papulo pustular rash on back- present since Nov and doubt this is any antibiotic allergy ? Acne\? Folliculitis -     Discussed the management with patient and his wife Follow-up as needed.

## 2023-01-06 LAB — MISC LABCORP TEST (SEND OUT): Labcorp test code: 183402

## 2023-01-07 ENCOUNTER — Telehealth: Payer: Self-pay

## 2023-01-07 NOTE — Telephone Encounter (Signed)
-----   Message from [place], [place] sent at 01/07/2023  2:33 PM EDT -----  ----- Message ----- From: Tsosie Billing, MD Sent: 01/07/2023  10:56 AM EDT To: Allegra Grana, CMA  Please let him know that the inflammatory markers are better, and still waiting on the culture result- thx  ----- Message ----- From: Interface, Lab In [place] Sent: 01/01/2023  11:45 AM EDT To: Tsosie Billing, MD

## 2023-01-09 LAB — AEROBIC ID BY MALDI

## 2023-01-12 LAB — ORGANISM ID BY MALDI

## 2023-01-15 ENCOUNTER — Telehealth: Payer: Self-pay

## 2023-01-15 NOTE — Telephone Encounter (Signed)
Patient informed and verbalized understanding. Bryan Frey T [name]  

## 2023-01-15 NOTE — Telephone Encounter (Signed)
-----   Message from Jayashree Ravishankar, MD sent at 01/14/2023  9:58 PM EDT ----- The culture shows only skin bacteria which is sitting there, not significant -- no antibiotic . thx ----- Message ----- From: Interface, Lab In [place] Sent: 01/01/2023  11:45 AM EDT To: Jayashree Ravishankar, MD   

## 2023-01-15 NOTE — Telephone Encounter (Signed)
-----   Message from Lynn Ito, MD sent at 01/14/2023  9:58 PM EDT ----- The culture shows only skin bacteria which is sitting there, not significant -- no antibiotic . thx ----- Message ----- From: Leory Plowman, Lab In [place] Sent: 01/01/2023  11:45 AM EDT To: Lynn Ito, MD

## 2023-01-20 LAB — AEROBIC CULTURE W GRAM STAIN (SUPERFICIAL SPECIMEN): Gram Stain: NONE SEEN

## 2023-02-03 ENCOUNTER — Telehealth: Payer: Self-pay

## 2023-02-03 NOTE — Telephone Encounter (Signed)
Patient called stating that his wound is draining more - the discharge was white in color but now it is blood. Patient stated that he doesn't have any pain and the discharge doesn't have any odor.    Chelesea Weiand Lesli Albee, CMA

## 2023-02-05 NOTE — Telephone Encounter (Signed)
I followed up with the patient and he reports the drainage has went back to "normal". Patient reports no concerns and does not feel he needs an appointment

## 2023-03-30 ENCOUNTER — Telehealth: Payer: Self-pay

## 2023-03-30 NOTE — Telephone Encounter (Signed)
Received voicemail from Jim (6/21 at 1:26 PM) stating that the discharge from his hip has stopped and that it has been absent for about a week. Will route to provider.   Sandie Ano, RN

## 2023-07-02 ENCOUNTER — Ambulatory Visit: Payer: Medicare HMO | Attending: Infectious Diseases | Admitting: Infectious Diseases

## 2023-07-02 VITALS — BP 102/70 | HR 82 | Temp 97.5°F

## 2023-07-02 DIAGNOSIS — T8452XD Infection and inflammatory reaction due to internal left hip prosthesis, subsequent encounter: Secondary | ICD-10-CM | POA: Diagnosis not present

## 2023-07-02 DIAGNOSIS — T8450XD Infection and inflammatory reaction due to unspecified internal joint prosthesis, subsequent encounter: Secondary | ICD-10-CM

## 2023-07-02 DIAGNOSIS — Z8619 Personal history of other infectious and parasitic diseases: Secondary | ICD-10-CM

## 2023-07-02 DIAGNOSIS — R21 Rash and other nonspecific skin eruption: Secondary | ICD-10-CM | POA: Insufficient documentation

## 2023-07-02 DIAGNOSIS — X58XXXA Exposure to other specified factors, initial encounter: Secondary | ICD-10-CM | POA: Diagnosis not present

## 2023-07-02 DIAGNOSIS — T8452XA Infection and inflammatory reaction due to internal left hip prosthesis, initial encounter: Secondary | ICD-10-CM | POA: Diagnosis present

## 2023-07-02 DIAGNOSIS — L739 Follicular disorder, unspecified: Secondary | ICD-10-CM | POA: Diagnosis not present

## 2023-07-02 DIAGNOSIS — Z96642 Presence of left artificial hip joint: Secondary | ICD-10-CM | POA: Insufficient documentation

## 2023-07-02 DIAGNOSIS — I10 Essential (primary) hypertension: Secondary | ICD-10-CM | POA: Insufficient documentation

## 2023-07-02 MED ORDER — DOXYCYCLINE HYCLATE 100 MG PO TABS: 100.0000 mg | ORAL_TABLET | Freq: Two times a day (BID) | ORAL | 3 refills | Status: DC

## 2023-07-02 NOTE — Patient Instructions (Addendum)
You are here for follow up of the left prosthetic joint infection- you have been restarted on doxycycline 100mg  PO BID- since the past 2 weeks- as you have had staph lugdunensis and then MRSA-= continue to take the doxy Will check labs in 2 months Follow up 3 months

## 2023-07-02 NOTE — Progress Notes (Signed)
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  After a prolonged course of antibiotics he had stopped doxycycline. In late August started having some discharge from his left hip and saw Dr. Rosita Kea and was restarted on Doxy He saw dermatologist for the acneform eruption on his back and was put on Keflex for folliculitis and also fluconazole.  He says the discharge from their left hip has resolved    Following is taken from the previous notes Bryan Frey is a 77 y.o. male with a history of HTN, trigeminal neuralgia on carbamazepine,  left hip hemiarthroplasty X2  last one being 2 yrs ago , lumbar fusion developed a soft blister on the surgical scar 3 weeks ago, He had no pain- the blister opened up and he had lot of discharge- He saw ortho on 8/28 and had imaging suspicious for a sinus tract- HE had culture of the fluid which grew MSSA- On 06/06/22 he underwent washout and polyeytlene capsule exchange. 3 cultures were sent- On 9/18 it was finalized as staph lugdunensis from the joint and the culture from under IT band- both these cultures have different Susceptibility- one was oxacillin susceptible and the other was oxacillin resistant, as the sample was not available for confirmation by the time I saw him he was started on Iv dapto and PO rifampin on 06/28/22 He completed 6 weeks of dapto on 08/07/22- Rifampin was stopped before that because  itching on his back and papulopustular eruption on the rt side of his back This has not cleared  After finishing dapto he went  on PO keflex 1 gram Q6 + bactrim DS 1 BID, which was changed to Doxycycline Feb 2nd week   doxycycline was discontinued on 12/16/2022 because of persistent discharge and him wondering whether  the doxycycline was causing the rash on his back even though he has had this unilateral acneform like lesions  on the right side for the prior  6 months.    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 removed tumor and all is clear since then   Nephrolithiasis    Neuromuscular disorder ([facility])    facial nerve pain   Spinal stenosis of lumbar region    with neurogenic claudication   Stroke ([facility]) 2010   heat stroke d/t a hot day and working under a house.no problems after   Trigeminal neuralgia     Past Surgical History:  Procedure Laterality Date   BACK SURGERY     CATARACT EXTRACTION W/PHACO Left 12/24/2017   Procedure: CATARACT EXTRACTION PHACO AND INTRAOCULAR LENS PLACEMENT (IOC);  Surgeon: Nevada Crane, MD;  Location: [facility];  Service: Ophthalmology;  Laterality: Left;  Lot # [id number] H [place]: 00:30.4 AP%: 9.4 CDE:  2.84   CATARACT EXTRACTION W/PHACO Right 01/26/2018   Procedure: CATARACT EXTRACTION PHACO AND INTRAOCULAR LENS PLACEMENT (IOC) RIGHT;  Surgeon: Nevada Crane, MD;  Location: [facility];  Service: Ophthalmology;  Laterality: Right;   COLONOSCOPY     COLONOSCOPY  12/03/2020   Procedure: COLONOSCOPY;  Surgeon: Toledo, Boykin Nearing, MD;  Location: [facility];  Service: Gastroenterology;;   EYE SURGERY Bilateral    cataract extra tions   INCISION AND DRAINAGE HIP Left 06/06/2022   Procedure: IRRIGATION AND DEBRIDEMENT OF LEFT HIP WITH FEMORAL HEAD EXCHANGE;  Surgeon: Donato Heinz, MD;  Location: [facility];  Service: Orthopedics;  Laterality: Left;   IR RADIOLOGIST EVAL & MGMT  03/18/2017   JOINT REPLACEMENT Bilateral     left hip replaced x 2, right hip x 1   Left total hip arthroplasty  08/07/2010   LUMBAR LAMINECTOMY/DECOMPRESSION MICRODISCECTOMY  02/09/2012   Procedure: LUMBAR LAMINECTOMY/DECOMPRESSION MICRODISCECTOMY 2 LEVELS;  Surgeon: Barnett Abu, MD;  Location: [facility];  Service: Neurosurgery;  Laterality: Bilateral;  Bilateral Lumbar two-three,lumbar four-five laminectomies   NEPHROLITHOTOMY Right 02/09/2014   Procedure: NEPHROLITHOTOMY PERCUTANEOUS;  Surgeon: Marcine Matar, MD;  Location: [facility];  Service: Urology;  Laterality: Right;   Right total hip arthroplasty  07/13/2014   TOTAL HIP REVISION Left 05/14/2015   Procedure: TOTAL HIP REVISION;  Surgeon: Donato Heinz, MD;  Location: [facility];  Service: Orthopedics;  Laterality: Left;   TOTAL HIP REVISION Left 04/06/2020   Procedure: TOTAL HIP REVISION;  Surgeon: Donato Heinz, MD;  Location: [facility];  Service: Orthopedics;  Laterality: Left;    Social History   Socioeconomic History   Marital status: Married    Spouse name: Terri   Number of children: 2   Years of education: 16   Highest education level: Not on file  Occupational History   Occupation: Emergency planning/management officer    Comment: retired  Tobacco Use   Smoking status: Never   Smokeless tobacco: Never  Vaping Use   Vaping status: Never Used  Substance and Sexual Activity   Alcohol use: No   Drug use: No    Types: Other-see comments    Comment: tramadol   Sexual activity: Not Currently  Other Topics Concern   Not on file  Social History Narrative   Lives with wife   Caffeine use: Drinks coffee/tea/soda occassionally.   Right handed   Social Determinants of Health   Financial Resource Strain: Low Risk  (06/23/2023)   Received [facility]   Overall Financial Resource Strain (CARDIA)    Difficulty of Paying Living Expenses: Not hard at all  Food Insecurity: No Food Insecurity (06/23/2023)   Received from [facility]   Hunger Vital  Sign    Worried About Running Out of Food in the Last Year: Never true    Ran Out of Food in the Last Year: Never true  Transportation Needs: No Transportation Needs (06/23/2023)   Received from [facility] - Transportation    In the past 12 months, has lack of transportation kept you from medical appointments or from getting medications?: No    Lack of Transportation (Non-Medical): No  Physical Activity: Not on file  Stress: Not on file  Social Connections: Not on file  Intimate Partner Violence: Not on file    Family History  Problem Relation Age of Onset   Anesthesia problems Neg Hx    Allergies  Allergen Reactions  Morphine And Codeine Other (See Comments)    Hallucinations   Statins     Other reaction(s): Muscle Pain Other reaction(s): Muscle Pain   I? Current Outpatient Medications  Medication Sig Dispense Refill   acetaminophen (TYLENOL) 500 MG tablet Take 1,000 mg by mouth daily.     bacitracin 500 UNIT/GM ointment Apply 1 Application topically 2 (two) times daily. 15 g 0   carbamazepine (TEGRETOL) 200 MG tablet Take 400 mg by mouth 3 (three) times daily.      cephALEXin (KEFLEX) 500 MG capsule Take 500 mg by mouth 2 (two) times daily.     diphenhydramine-acetaminophen (TYLENOL PM) 25-500 MG TABS Take 2 tablets by mouth at bedtime.     doxycycline (VIBRA-TABS) 100 MG tablet Take 100 mg by mouth 2 (two) times daily.     DULoxetine (CYMBALTA) 60 MG capsule Take 60 mg by mouth 2 (two) times daily.     fluconazole (DIFLUCAN) 200 MG tablet Take 200 mg by mouth daily.     ketoconazole (NIZORAL) 2 % cream Apply 1 Application topically daily.     Melatonin 5 MG CAPS Take 5 mg by mouth at bedtime.     tamsulosin (FLOMAX) 0.4 MG CAPS capsule Take 1 capsule (0.4 mg total) by mouth daily. 30 capsule 1   traMADol (ULTRAM) 50 MG tablet Take 1 tablet (50 mg total) by mouth every 4 (four) hours as needed for moderate pain. 30 tablet 0   vitamin B-12  (CYANOCOBALAMIN) 1000 MCG tablet Take 1,000 mcg by mouth daily.     amLODipine (NORVASC) 10 MG tablet Take 10 mg by mouth daily.  (Patient not taking: Reported on 07/02/2023)     hydrochlorothiazide (HYDRODIURIL) 25 MG tablet Take 25 mg by mouth daily.  (Patient not taking: Reported on 07/02/2023)     No current facility-administered medications for this visit.     Abtx:  Anti-infectives (From admission, onward)    None       REVIEW OF SYSTEMS:  Const: negative fever, negative chills, negative weight loss Eyes: negative diplopia or visual changes, negative eye pain ENT: negative coryza, negative sore throat Resp: cough, no dyspnea, recently had COVID and has recovered Cards: negative for chest pain, palpitations, lower extremity edema GU: negative for frequency, dysuria and hematuria GI: Negative for abdominal pain, diarrhea, bleeding, constipation Skin: as above Heme: negative for easy bruising and gum/nose bleeding MS: left hip discharge intermittently, walks with cane Neurolo:negative for headaches, dizziness, vertigo, memory problems  Psych: negative for feelings of anxiety, depression  Endocrine: negative for thyroid, diabetes Allergy/Immunology- as above  Objective:  VITALS:  BP 102/70   Pulse 82   Temp (!) 97.5 F (36.4 C) (Temporal)   PHYSICAL EXAM:  General: Awake and alert, no distress, walks with a limp he has got a cane Head: Normocephalic, without obvious abnormality, atraumatic. Lungs: Clear to auscultation bilaterally. No Wheezing or Rhonchi. No rales. Heart: Regular rate and rhythm, no murmur, rub or gallop. Abdomen: Soft, non-tender,not distended. Bowel sounds normal. No masses Extremities: left upper thigh surgical scar has healed.  There is puckering at 1 area   Acneform eruptions persist on the right side  Lymph: Cervical, supraclavicular normal. Neurologic: Grossly non-focal Pertinent Labs ESR-66>31 CRP-2.7>N   Microbiology: 06/03/22- Staph  aureus 06/06/22 surgical culture Wound culture from left hip joint staph lugdunensis - S to oxacillin 9/1 culture from soft tissue- Staph L R to oxacillin  06/26/22- left hip fluid culture NG 08/05/22- L Hip fluid culture NG  ?  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 in cultures- different susceptibilities- Pt started Iv dapto and Po rifampin on 9/23. Rifampin stopped Rifampin on 10/31 due to rash He  completed 6 weeks of dapto  on 08/07/22, but he still has some  drainage from 1 hole. The other one had closed- He was placed  on Keflex and bactrim starting 08/08/22,  Both antibiotics DC on 11/17/22 because there was concern for rash and it was changed to Po doxy 100mg  BID ESR from 2/1 improved at 31 But on 06/11/23 it was 48  Last CRP is 22  Doxy stopped on 12/16/2022 after nearly 6 months of antibiotics.  As patient wanted to stop and see how he would do.  Restarted 1st week in sept 2024 by Dr.Menz because of recurrence of discarge- no culture sent Intermittent discharge=none in the past 5 days So cannot send for culture  HE will continue doxy Will follow up with me  Will do culture if the discharge recurs  Papulo pustular rash on back- present since Nov and doubt this is any antibiotic allergy ? Acne\? Folliculitis - on short course of keflex and fluconazole prescribed by derm    Discussed the management with patient and his wife and with Dr.Hooten Follow-up 3 months Labs 2 months

## 2023-09-16 ENCOUNTER — Telehealth: Payer: Self-pay

## 2023-09-16 NOTE — Telephone Encounter (Signed)
Patient called office today to cancel upcoming appt on 12/17. States that he has no concerns at this time and does not feel like he needs appt. Declined to reschedule at this time. Will call office if he has any concerns. Juanita Laster, RMA

## 2023-09-22 ENCOUNTER — Ambulatory Visit: Payer: Medicare HMO | Admitting: Infectious Diseases

## 2023-10-22 DIAGNOSIS — K219 Gastro-esophageal reflux disease without esophagitis: Secondary | ICD-10-CM | POA: Diagnosis not present

## 2023-10-22 DIAGNOSIS — R053 Chronic cough: Secondary | ICD-10-CM | POA: Diagnosis not present

## 2023-11-05 ENCOUNTER — Ambulatory Visit: Payer: HMO | Attending: Infectious Diseases | Admitting: Infectious Diseases

## 2023-11-05 ENCOUNTER — Encounter: Payer: Self-pay | Admitting: Infectious Diseases

## 2023-11-05 ENCOUNTER — Other Ambulatory Visit
Admission: RE | Admit: 2023-11-05 | Discharge: 2023-11-05 | Disposition: A | Discharge status: Home / Self Care | Payer: HMO | Source: Ambulatory Visit | Attending: Infectious Diseases | Admitting: Infectious Diseases

## 2023-11-05 DIAGNOSIS — X58XXXA Exposure to other specified factors, initial encounter: Secondary | ICD-10-CM | POA: Insufficient documentation

## 2023-11-05 DIAGNOSIS — T8450XD Infection and inflammatory reaction due to unspecified internal joint prosthesis, subsequent encounter: Secondary | ICD-10-CM | POA: Diagnosis not present

## 2023-11-05 DIAGNOSIS — Z96642 Presence of left artificial hip joint: Secondary | ICD-10-CM | POA: Diagnosis not present

## 2023-11-05 DIAGNOSIS — R21 Rash and other nonspecific skin eruption: Secondary | ICD-10-CM | POA: Insufficient documentation

## 2023-11-05 DIAGNOSIS — L739 Follicular disorder, unspecified: Secondary | ICD-10-CM | POA: Insufficient documentation

## 2023-11-05 DIAGNOSIS — I10 Essential (primary) hypertension: Secondary | ICD-10-CM | POA: Insufficient documentation

## 2023-11-05 DIAGNOSIS — T8452XA Infection and inflammatory reaction due to internal left hip prosthesis, initial encounter: Secondary | ICD-10-CM | POA: Diagnosis not present

## 2023-11-05 DIAGNOSIS — T8452XD Infection and inflammatory reaction due to internal left hip prosthesis, subsequent encounter: Secondary | ICD-10-CM

## 2023-11-05 LAB — CBC WITH DIFFERENTIAL/PLATELET
Abs Immature Granulocytes: 0.02 10*3/uL (ref 0.00–0.07)
Basophils Absolute: 0.1 10*3/uL (ref 0.0–0.1)
Basophils Relative: 1 %
Eosinophils Absolute: 0.5 10*3/uL (ref 0.0–0.5)
Eosinophils Relative: 6 %
HCT: 40.7 % (ref 39.0–52.0)
Hemoglobin: 12.9 g/dL — ABNORMAL LOW (ref 13.0–17.0)
Immature Granulocytes: 0 %
Lymphocytes Relative: 22 %
Lymphs Abs: 1.6 10*3/uL (ref 0.7–4.0)
MCH: 30.7 pg (ref 26.0–34.0)
MCHC: 31.7 g/dL (ref 30.0–36.0)
MCV: 96.9 fL (ref 80.0–100.0)
Monocytes Absolute: 0.5 10*3/uL (ref 0.1–1.0)
Monocytes Relative: 7 %
Neutro Abs: 4.6 10*3/uL (ref 1.7–7.7)
Neutrophils Relative %: 64 %
Platelets: 314 10*3/uL (ref 150–400)
RBC: 4.2 MIL/uL — ABNORMAL LOW (ref 4.22–5.81)
RDW: 12.7 % (ref 11.5–15.5)
WBC: 7.2 10*3/uL (ref 4.0–10.5)
nRBC: 0 % (ref 0.0–0.2)

## 2023-11-05 LAB — COMPREHENSIVE METABOLIC PANEL
ALT: 16 U/L (ref 0–44)
AST: 15 U/L (ref 15–41)
Albumin: 4 g/dL (ref 3.5–5.0)
Alkaline Phosphatase: 125 U/L (ref 38–126)
Anion gap: 10 (ref 5–15)
BUN: 21 mg/dL (ref 8–23)
CO2: 27 mmol/L (ref 22–32)
Calcium: 8.9 mg/dL (ref 8.9–10.3)
Chloride: 103 mmol/L (ref 98–111)
Creatinine, Ser: 1 mg/dL (ref 0.61–1.24)
GFR, Estimated: >60 mL/min (ref >60)
Glucose, Bld: 104 mg/dL — ABNORMAL HIGH (ref 70–99)
Potassium: 4.4 mmol/L (ref 3.5–5.1)
Sodium: 140 mmol/L (ref 135–145)
Total Bilirubin: 0.6 mg/dL (ref 0.0–1.2)
Total Protein: 7.4 g/dL (ref 6.5–8.1)

## 2023-11-05 LAB — SEDIMENTATION RATE: Sed Rate: 23 mm/h — ABNORMAL HIGH (ref 0–20)

## 2023-11-05 LAB — C-REACTIVE PROTEIN: CRP: 1.9 mg/dL — ABNORMAL HIGH (ref <1.0)

## 2023-11-05 NOTE — Patient Instructions (Addendum)
You are here for a new lesion at the site of the Previous surgery to your left hip-;does not look infected =dont take doxy- will do labs , will get culture and decide after that

## 2023-11-05 NOTE — Progress Notes (Addendum)
NAME: Bryan Frey  DOB: August 25, 1946  MRN: [id number]  Date/Time: 11/05/2023 11:12 PM   Subjective:  left hip Prosthetic joint infection- Last seen in Sept 2204 and he was off antibiotics and doing well until a week ago when he hurt his hip on a golf cart and has a small swelling at the surgical scar of left hip- has some serous discharge- no fever or chills-no pain    Following is taken from the previous notes Bryan Frey is a 78 y.o. male with a history of HTN, trigeminal neuralgia on carbamazepine,  left hip hemiarthroplasty X2  last revision July 2021 , lumbar fusion developed a soft blister on the surgical scar in Aug  2023, He had no pain- the blister opened up and he had lot of discharge- He saw ortho on 8/28 and had imaging suspicious for a sinus tract- HE had culture of the fluid which grew MSSA- On 06/06/22 he underwent washout and polyeytlene capsule exchange. 3 cultures were sent- On 9/18 it was finalized as staph lugdunensis from the joint and the culture from under IT band- both these cultures have different Susceptibility- one was oxacillin susceptible and the other was oxacillin resistant, as the sample was not available for confirmation by the time I saw him he was started on Iv dapto and PO rifampin on 06/28/22 He completed 6 weeks of dapto on 08/07/22- Rifampin was stopped before that because  itching on his back and papulopustular eruption on the rt side of his back This has not cleared  After finishing dapto he went  on PO keflex 1 gram Q6 + bactrim DS 1 BID, which was changed to Doxycycline Feb 2nd week of 2024  doxycycline was discontinued on 12/16/2022 because of persistent discharge and him wondering whether  the doxycycline was causing the rash on his back even though he has had this unilateral acneform like lesions on the right side for the prior  6 months. He then took doxy again in late August for discharge and stopped in Sept 2024 and did not take any until a week  ago    Past Medical History:  Diagnosis Date   Acquired short leg syndrome on left    Anemia    vitamin b12 deficiency   Anxiety    with diagnosis   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 removed tumor and all is clear since then   Nephrolithiasis    Neuromuscular disorder (HCC)    facial nerve pain   Spinal stenosis of lumbar region    with neurogenic claudication   Stroke (HCC) 2010   heat stroke d/t a hot day and working under a house.no problems after   Trigeminal neuralgia     Past Surgical History:  Procedure Laterality Date   BACK SURGERY     CATARACT EXTRACTION W/PHACO Left 12/24/2017   Procedure: CATARACT EXTRACTION PHACO AND INTRAOCULAR LENS PLACEMENT (IOC);  Surgeon: Nevada Crane, MD;  Location: [facility];  Service: Ophthalmology;  Laterality: Left;  Lot # [id number] H [place]: 00:30.4 AP%: 9.4  CDE: 2.84   CATARACT EXTRACTION W/PHACO Right 01/26/2018   Procedure: CATARACT EXTRACTION PHACO AND INTRAOCULAR LENS PLACEMENT (IOC) RIGHT;  Surgeon: Nevada Crane, MD;  Location: [facility];  Service: Ophthalmology;  Laterality: Right;   COLONOSCOPY     COLONOSCOPY  12/03/2020   Procedure: COLONOSCOPY;  Surgeon: Toledo, Boykin Nearing, MD;  Location: [facility];  Service: Gastroenterology;;   EYE SURGERY Bilateral    cataract extra tions   INCISION AND DRAINAGE HIP Left 06/06/2022   Procedure: IRRIGATION AND DEBRIDEMENT OF LEFT HIP WITH FEMORAL HEAD EXCHANGE;  Surgeon: Donato Heinz, MD;  Location: [facility];  Service: Orthopedics;  Laterality: Left;   IR RADIOLOGIST EVAL & MGMT  03/18/2017   JOINT REPLACEMENT Bilateral    left hip replaced x 2, right hip x  1   Left total hip arthroplasty  08/07/2010   LUMBAR LAMINECTOMY/DECOMPRESSION MICRODISCECTOMY  02/09/2012   Procedure: LUMBAR LAMINECTOMY/DECOMPRESSION MICRODISCECTOMY 2 LEVELS;  Surgeon: Barnett Abu, MD;  Location: [facility];  Service: Neurosurgery;  Laterality: Bilateral;  Bilateral Lumbar two-three,lumbar four-five laminectomies   NEPHROLITHOTOMY Right 02/09/2014   Procedure: NEPHROLITHOTOMY PERCUTANEOUS;  Surgeon: Marcine Matar, MD;  Location: [facility];  Service: Urology;  Laterality: Right;   Right total hip arthroplasty  07/13/2014   TOTAL HIP REVISION Left 05/14/2015   Procedure: TOTAL HIP REVISION;  Surgeon: Donato Heinz, MD;  Location: [facility];  Service: Orthopedics;  Laterality: Left;   TOTAL HIP REVISION Left 04/06/2020   Procedure: TOTAL HIP REVISION;  Surgeon: Donato Heinz, MD;  Location: [facility];  Service: Orthopedics;  Laterality: Left;    Social History   Socioeconomic History   Marital status: Married    Spouse name: Terri   Number of children: 2   Years of education: 16   Highest education level: Not on file  Occupational History   Occupation: Emergency planning/management officer    Comment: retired  Tobacco Use   Smoking status: Never   Smokeless tobacco: Never  Vaping Use   Vaping status: Never Used  Substance and Sexual Activity   Alcohol use: No   Drug use: No    Types: Other-see comments    Comment: tramadol   Sexual activity: Not Currently  Other Topics Concern   Not on file  Social History Narrative   Lives with wife   Caffeine use: Drinks coffee/tea/soda occassionally.   Right handed   Social Drivers of Health   Financial Resource Strain: Low Risk  (07/03/2023)   Received from [facility]   Overall Financial Resource Strain (CARDIA)    Difficulty of Paying Living Expenses: Not hard at all  Food Insecurity: No Food Insecurity (07/03/2023)   Received from [facility]   Hunger Vital Sign    Ran Out of Food in the Last Year:  Never true    Worried About Running Out of Food in the Last Year: Never true  Transportation Needs: No Transportation Needs (07/03/2023)   Received from [facility]   PRAPARE - Transportation    Lack of Transportation (Non-Medical): No    In the past 12 months, has lack of transportation kept you from medical appointments or from getting medications?: No  Physical Activity: Not on file  Stress: Not on file  Social Connections: Not on file  Intimate Partner Violence: Not on file    Family History  Problem Relation Age of Onset   Anesthesia problems Neg Hx    Allergies  Allergen  Reactions   Morphine And Codeine Other (See Comments)    Hallucinations   Statins     Other reaction(s): Muscle Pain Other reaction(s): Muscle Pain   I? Current Outpatient Medications  Medication Sig Dispense Refill   acetaminophen (TYLENOL) 500 MG tablet Take 1,000 mg by mouth daily.     amLODipine (NORVASC) 10 MG tablet Take 10 mg by mouth daily.     bacitracin 500 UNIT/GM ointment Apply 1 Application topically 2 (two) times daily. 15 g 0   carbamazepine (TEGRETOL) 200 MG tablet Take 400 mg by mouth 3 (three) times daily.      diphenhydramine-acetaminophen (TYLENOL PM) 25-500 MG TABS Take 2 tablets by mouth at bedtime.     doxycycline (VIBRA-TABS) 100 MG tablet Take 1 tablet (100 mg total) by mouth 2 (two) times daily. 60 tablet 3   DULoxetine (CYMBALTA) 60 MG capsule Take 60 mg by mouth 2 (two) times daily.     hydrochlorothiazide (HYDRODIURIL) 25 MG tablet Take 25 mg by mouth daily.     ketoconazole (NIZORAL) 2 % cream Apply 1 Application topically daily.     Melatonin 5 MG CAPS Take 5 mg by mouth at bedtime.     pantoprazole (PROTONIX) 40 MG tablet Take 40 mg by mouth daily.     tamsulosin (FLOMAX) 0.4 MG CAPS capsule Take 1 capsule (0.4 mg total) by mouth daily. 30 capsule 1   traMADol (ULTRAM) 50 MG tablet Take 1 tablet (50 mg total) by mouth every 4 (four) hours as needed for  moderate pain. 30 tablet 0   vitamin B-12 (CYANOCOBALAMIN) 1000 MCG tablet Take 1,000 mcg by mouth daily.     No current facility-administered medications for this visit.     Abtx:  Anti-infectives (From admission, onward)    None       REVIEW OF SYSTEMS:  Const: negative fever, negative chills, negative weight loss Eyes: negative diplopia or visual changes, negative eye pain ENT: negative coryza, negative sore throat Resp: cough, no dyspnea, recently had COVID and has recovered Cards: negative for chest pain, palpitations, lower extremity edema GU: negative for frequency, dysuria and hematuria GI: Negative for abdominal pain, diarrhea, bleeding, constipation Skin: as above Heme: negative for easy bruising and gum/nose bleeding MS: left hip surgical scar- new swelling Neurolo:negative for headaches, dizziness, vertigo, memory problems  Psych: negative for feelings of anxiety, depression  Endocrine: negative for thyroid, diabetes Allergy/Immunology- as above  Objective:  VITALS:  BP 125/76   Pulse 66   Temp (!) 97.3 F (36.3 C) (Temporal)   Ht 5\' 11"  (1.803 m)   Wt 195 lb (88.5 kg)   BMI 27.20 kg/m   PHYSICAL EXAM:  General: Awake and alert, no distress, walks with a limp he has got a cane Head: Normocephalic, without obvious abnormality, atraumatic. Lungs: Clear to auscultation bilaterally. No Wheezing or Rhonchi. No rales. Heart: Regular rate and rhythm, no murmur, rub or gallop. Abdomen: Soft, non-tender,not distended. Bowel sounds normal. No masses Extremities:  11/05/23    Sept 26, 2024 left upper thigh surgical scar has healed.  There is puckering at 1 area   Acneform eruptions persist on the right side  Lymph: Cervical, supraclavicular normal. Neurologic: Grossly non-focal Pertinent Labs ESR-66>31 CRP-2.7>N   Microbiology: 06/03/22- Staph aureus 06/06/22 surgical culture Wound culture from left hip joint staph lugdunensis - S to oxacillin 9/1  culture from soft tissue- Staph L R to oxacillin  06/26/22- left hip fluid culture NG 08/05/22- L Hip fluid culture  NG  11/27/22 staph epidermidis  01/01/23 superficial culture  staph epi and corynebacterium- skin contaminants  ? Impression/Recommendation Left hip prosthetic joint infection:  initially had staph lugdunensis in cultures- different susceptibilities- Pt started Iv dapto and Po rifampin on 9/23/. Rifampin stopped on 10/31 due to rash He  completed 6 weeks of dapto  on 08/07/22, and then  on Keflex and bactrim starting 08/08/22,  Both antibiotics DC on 11/17/22 because there was concern for rash and it was changed to Po doxy 100mg  BID ESR from 2/1 improved at 31 But on 06/11/23 it was 48  Last CRP is 22  Doxy stopped on 12/16/2022 after nearly 6 months of antibiotics.  As patient wanted to stop and see how he would do.  Restarted 1st week in sept 2024 by Dr.Menz because of recurrence of discarge- no culture sent He stopped after couple of weeks HE is here to see me for a week history of a oft swelling at the surgical site-pyogenic granuloma VS seroma- - minimal discharge I asked patient to culture if he develops significant discharge Will do labs today Hold  doxy or any other antibiotic as stabke and no systemic illness and till we get a culture   Papulo pustular rash on back- present since Nov and doubt this is any antibiotic allergy ? Acne\? Folliculitis - on short course of keflex and fluconazole prescribed by derm    Discussed the management with patient and his wife and with Dr.Hooten. manage conservatively Will call him with labs  P.S. 11/06/23 at 2pm wife brought in culture as the wound had openined and draining serous fluid

## 2023-11-06 ENCOUNTER — Other Ambulatory Visit
Admission: RE | Admit: 2023-11-06 | Discharge: 2023-11-06 | Disposition: A | Discharge status: Home / Self Care | Payer: HMO | Source: Ambulatory Visit | Attending: Infectious Diseases | Admitting: Infectious Diseases

## 2023-11-06 DIAGNOSIS — T8450XA Infection and inflammatory reaction due to unspecified internal joint prosthesis, initial encounter: Secondary | ICD-10-CM | POA: Diagnosis present

## 2023-11-06 DIAGNOSIS — T8450XD Infection and inflammatory reaction due to unspecified internal joint prosthesis, subsequent encounter: Secondary | ICD-10-CM | POA: Insufficient documentation

## 2023-11-06 NOTE — Addendum Note (Signed)
Addended by: Lynn Ito on: 11/06/2023 03:26 PM   Modules accepted: Orders

## 2023-11-09 LAB — AEROBIC CULTURE W GRAM STAIN (SUPERFICIAL SPECIMEN): Gram Stain: NONE SEEN

## 2023-11-10 ENCOUNTER — Other Ambulatory Visit: Payer: Self-pay | Admitting: Infectious Diseases

## 2023-11-10 DIAGNOSIS — A4902 Methicillin resistant Staphylococcus aureus infection, unspecified site: Secondary | ICD-10-CM

## 2023-11-10 MED ORDER — DOXYCYCLINE HYCLATE 100 MG PO TABS: 100.0000 mg | ORAL_TABLET | Freq: Two times a day (BID) | ORAL | 1 refills | Status: DC

## 2023-11-10 NOTE — Progress Notes (Signed)
Pt has MRSA in the surgical wound- H/o left PJI - will do doxy- as linezolid cannot be done due to tegretol and duloxetine

## 2023-11-11 ENCOUNTER — Other Ambulatory Visit: Payer: Self-pay | Admitting: Pharmacist

## 2023-11-11 DIAGNOSIS — A4902 Methicillin resistant Staphylococcus aureus infection, unspecified site: Secondary | ICD-10-CM

## 2023-11-11 NOTE — Progress Notes (Signed)
 Pharmacy - Antimicrobial Stewardship  Received call from microbiology that Dr Fayette had called 2/4 and requested daptomycin  MIC from 1/31 wound culture with MRSA but they were unable to find an order.  I attempted to open an orders only encounter to place the order.  I was not able to enter the order but did find a order Dr. Fayette entered yesterday as a future order and released it.  Microbiology was able to see the order once released and will sent to [facility] for Daptomycin  MIC   Celestine Slovak, PharmD, BCPS, BCIDP Work Cell: [phone] 11/11/2023 10:02 AM

## 2023-11-14 LAB — MISC LABCORP TEST (SEND OUT)
LabCorp test name: 1
Labcorp test code: 96388

## 2023-11-15 LAB — AEROBIC CULTURE W GRAM STAIN (SUPERFICIAL SPECIMEN)

## 2023-12-24 ENCOUNTER — Ambulatory Visit: Payer: HMO | Attending: Infectious Diseases | Admitting: Infectious Diseases

## 2023-12-24 ENCOUNTER — Encounter: Payer: Self-pay | Admitting: Infectious Diseases

## 2023-12-24 VITALS — BP 115/69 | HR 79 | Temp 98.1°F

## 2023-12-24 DIAGNOSIS — R739 Hyperglycemia, unspecified: Secondary | ICD-10-CM | POA: Diagnosis not present

## 2023-12-24 DIAGNOSIS — Z792 Long term (current) use of antibiotics: Secondary | ICD-10-CM | POA: Diagnosis not present

## 2023-12-24 DIAGNOSIS — G500 Trigeminal neuralgia: Secondary | ICD-10-CM | POA: Diagnosis not present

## 2023-12-24 DIAGNOSIS — T8450XD Infection and inflammatory reaction due to unspecified internal joint prosthesis, subsequent encounter: Secondary | ICD-10-CM

## 2023-12-24 DIAGNOSIS — T8452XA Infection and inflammatory reaction due to internal left hip prosthesis, initial encounter: Secondary | ICD-10-CM | POA: Insufficient documentation

## 2023-12-24 DIAGNOSIS — B9562 Methicillin resistant Staphylococcus aureus infection as the cause of diseases classified elsewhere: Secondary | ICD-10-CM | POA: Diagnosis not present

## 2023-12-24 DIAGNOSIS — E7849 Other hyperlipidemia: Secondary | ICD-10-CM | POA: Diagnosis not present

## 2023-12-24 DIAGNOSIS — I10 Essential (primary) hypertension: Secondary | ICD-10-CM | POA: Diagnosis not present

## 2023-12-24 DIAGNOSIS — A4902 Methicillin resistant Staphylococcus aureus infection, unspecified site: Secondary | ICD-10-CM

## 2023-12-24 DIAGNOSIS — Z125 Encounter for screening for malignant neoplasm of prostate: Secondary | ICD-10-CM | POA: Diagnosis not present

## 2023-12-24 DIAGNOSIS — R238 Other skin changes: Secondary | ICD-10-CM | POA: Diagnosis not present

## 2023-12-24 NOTE — Patient Instructions (Signed)
 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 Meanwhile continue doxy

## 2023-12-24 NOTE — Progress Notes (Signed)
 NAME: Bryan Frey  DOB: 1946/09/25  MRN: [id number]  Date/Time: 12/24/2023 11:31 AM   Subjective:  left hip Prosthetic joint infection- Last seen Jan 2025 Culture taken from the discharge was MRSA He is on Doxy But the discharge persist    Complicated infection  Bryan Frey Frey is a 78 y.o. male with a history of HTN, trigeminal neuralgia on carbamazepine,  left hip hemiarthroplasty X2  last revision July 2021 , lumbar fusion developed a soft blister on the surgical scar in Aug  2023, He had no pain- the blister opened up and he had lot of discharge- He saw ortho on 8/28 and had imaging suspicious for a sinus tract- HE had culture of the fluid which grew MSSA- On 06/06/22 he underwent washout and polyeytlene capsule exchange. 3 cultures were sent- On 9/18 it was finalized as staph lugdunensis from the joint and the culture from under IT band- both these cultures have different Susceptibility- one was oxacillin susceptible and the other was oxacillin resistant, as the sample was not available for confirmation by the time I saw him he was started on Iv dapto and PO rifampin on 06/28/22 He completed 6 weeks of dapto on 08/07/22- Rifampin was stopped before that because  itching on his back and papulopustular eruption on the rt side of his back This has not cleared  After finishing dapto he went  on PO keflex 1 gram Q6 + bactrim DS 1 BID, which was changed to Doxycycline Feb 2nd week of 2024  doxycycline was discontinued on 12/16/2022 because of persistent discharge and him wondering whether  the doxycycline was causing the rash on his back even though he has had this unilateral acneform like lesions on the right side for the prior  6 months. 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 removed tumor and all is clear since then   Nephrolithiasis    Neuromuscular disorder (HCC)    facial nerve pain   Spinal stenosis of lumbar region    with neurogenic claudication   Stroke (HCC) 2010   heat stroke d/t a hot day and working under a house.no problems after   Trigeminal neuralgia     Past Surgical History:  Procedure Laterality Date   BACK SURGERY     CATARACT EXTRACTION W/PHACO Left 12/24/2017   Procedure: CATARACT EXTRACTION PHACO AND INTRAOCULAR LENS PLACEMENT (IOC);  Surgeon: Nevada Crane, MD;  Location: [facility];  Service: Ophthalmology;  Laterality: Left;  Lot # [id number] H [place]: 00:30.4 AP%: 9.4 CDE: 2.84   CATARACT EXTRACTION W/PHACO Right 01/26/2018   Procedure: CATARACT EXTRACTION PHACO AND INTRAOCULAR LENS PLACEMENT (IOC) RIGHT;  Surgeon: Nevada Crane, MD;  Location: [facility];  Service: Ophthalmology;  Laterality: Right;   COLONOSCOPY     COLONOSCOPY  12/03/2020   Procedure: COLONOSCOPY;  Surgeon: Toledo, Boykin Nearing, MD;  Location: [facility];  Service: Gastroenterology;;   EYE SURGERY Bilateral    cataract extra tions   INCISION AND DRAINAGE HIP Left 06/06/2022   Procedure: IRRIGATION AND DEBRIDEMENT OF LEFT HIP WITH FEMORAL HEAD EXCHANGE;  Surgeon: Donato Heinz, MD;  Location: [facility];  Service: Orthopedics;  Laterality: Left;   IR RADIOLOGIST EVAL & MGMT  03/18/2017   JOINT REPLACEMENT Bilateral    left hip replaced x 2, right hip x 1   Left total hip arthroplasty  08/07/2010   LUMBAR LAMINECTOMY/DECOMPRESSION MICRODISCECTOMY  02/09/2012   Procedure: LUMBAR  LAMINECTOMY/DECOMPRESSION MICRODISCECTOMY 2 LEVELS;  Surgeon: Barnett Abu, MD;  Location: [facility];  Service: Neurosurgery;  Laterality: Bilateral;  Bilateral Lumbar two-three,lumbar four-five laminectomies   NEPHROLITHOTOMY Right 02/09/2014   Procedure: NEPHROLITHOTOMY PERCUTANEOUS;  Surgeon: Marcine Matar, MD;  Location: [facility];  Service: Urology;  Laterality: Right;   Right total hip arthroplasty  07/13/2014   TOTAL HIP REVISION Left 05/14/2015   Procedure: TOTAL HIP REVISION;  Surgeon: Donato Heinz, MD;  Location: [facility];  Service: Orthopedics;  Laterality: Left;   TOTAL HIP REVISION Left 04/06/2020   Procedure: TOTAL HIP REVISION;  Surgeon: Donato Heinz, MD;  Location: [facility];  Service: Orthopedics;  Laterality: Left;    Social History   Socioeconomic History   Marital status: Married    Spouse name: Terri   Number of children: 2   Years of education: 16   Highest education level: Not on file  Occupational History   Occupation: Emergency planning/management officer    Comment: retired  Tobacco Use   Smoking status: Never   Smokeless tobacco: Never  Vaping Use   Vaping status: Never Used  Substance and Sexual Activity   Alcohol use: No   Drug use: No    Types: Other-see comments    Comment: tramadol   Sexual activity: Not Currently  Other Topics Concern   Not on file  Social History Narrative   Lives with wife   Caffeine use: Drinks coffee/tea/soda occassionally.   Right handed   Social Drivers of Health   Financial Resource Strain: Low Risk  (07/03/2023)   Received from [facility]   Overall Financial Resource Strain (CARDIA)    Difficulty of Paying Living Expenses: Not hard at all  Food Insecurity: No Food Insecurity (07/03/2023)   Received [facility]   Hunger Vital Sign    Ran Out of Food in the Last Year: Never true    Worried About Running Out of Food in the Last Year: Never true  Transportation Needs: No Transportation Needs  (07/03/2023)   Received from [facility]   PRAPARE - Transportation    Lack of Transportation (Non-Medical): No    In the past 12 months, has lack of transportation kept you from medical appointments or from getting medications?: No  Physical Activity: Not on file  Stress: Not on file  Social Connections: Not on file  Intimate Partner Violence: Not on file    Family History  Problem Relation Age of Onset   Anesthesia problems Neg Hx    Allergies  Allergen Reactions   Morphine And Codeine Other (See Comments)    Hallucinations   Statins     Other reaction(s): Muscle Pain Other reaction(s): Muscle Pain   I? Current Outpatient  Medications  Medication Sig Dispense Refill   acetaminophen (TYLENOL) 500 MG tablet Take 1,000 mg by mouth daily.     amLODipine (NORVASC) 10 MG tablet Take 10 mg by mouth daily.     bacitracin 500 UNIT/GM ointment Apply 1 Application topically 2 (two) times daily. 15 g 0   carbamazepine (TEGRETOL) 200 MG tablet Take 400 mg by mouth 3 (three) times daily.      diphenhydramine-acetaminophen (TYLENOL PM) 25-500 MG TABS Take 2 tablets by mouth at bedtime.     doxycycline (VIBRA-TABS) 100 MG tablet Take 1 tablet (100 mg total) by mouth 2 (two) times daily. 60 tablet 1   DULoxetine (CYMBALTA) 60 MG capsule Take 60 mg by mouth 2 (two) times daily.     hydrochlorothiazide (HYDRODIURIL) 25 MG tablet Take 25 mg by mouth daily.     ketoconazole (NIZORAL) 2 % cream Apply 1 Application topically daily.     Melatonin 5 MG CAPS Take 5 mg by mouth at bedtime.     pantoprazole (PROTONIX) 40 MG tablet Take 40 mg by mouth daily.     tamsulosin (FLOMAX) 0.4 MG CAPS capsule Take 1 capsule (0.4 mg total) by mouth daily. 30 capsule 1   traMADol (ULTRAM) 50 MG tablet Take 1 tablet (50 mg total) by mouth every 4 (four) hours as needed for moderate pain. 30 tablet 0   triamcinolone (KENALOG) 0.025 % cream SMARTSIG:Topical Twice Daily     vitamin B-12  (CYANOCOBALAMIN) 1000 MCG tablet Take 1,000 mcg by mouth daily.     No current facility-administered medications for this visit.     Abtx:  Anti-infectives (From admission, onward)    None       REVIEW OF SYSTEMS:  Const: negative fever, negative chills, negative weight loss Eyes: negative diplopia or visual changes, negative eye pain ENT: negative coryza, negative sore throat Resp: cough, no dyspnea, recently had COVID and has recovered Cards: negative for chest pain, palpitations, lower extremity edema GU: negative for frequency, dysuria and hematuria GI: Negative for abdominal pain, diarrhea, bleeding, constipation Skin: as above Heme: negative for easy bruising and gum/nose bleeding MS: left hip surgical scar- discharge Neurolo:negative for headaches, dizziness, vertigo, memory problems  Psych: negative for feelings of anxiety, depression  Endocrine: negative for thyroid, diabetes Allergy/Immunology- as above  Objective:  VITALS:  BP 115/69   Pulse 79   Temp 98.1 F (36.7 C) (Temporal)   PHYSICAL EXAM:  General: Awake and alert, no distress, walks with a limp he has got a cane Head: Normocephalic, without obvious abnormality, atraumatic. Lungs: Clear to auscultation bilaterally. No Wheezing or Rhonchi. No rales. Heart: Regular rate and rhythm, no murmur, rub or gallop. Abdomen: Soft, non-tender,not distended. Bowel sounds normal. No masses Extremities:   Today the surgical scar has a small sinus 11/05/23    Sept 26, 2024 left upper thigh surgical scar has healed.  There is puckering at 1 area   Acneform eruptions persist on the right side  Lymph: Cervical, supraclavicular normal. Neurologic: Grossly non-focal Pertinent Labs ESR-66>31 CRP-2.7>N   Microbiology: 06/03/22- Staph aureus 06/06/22 surgical culture Wound culture from left hip joint staph lugdunensis - S to oxacillin 9/1 culture from soft tissue- Staph L R to oxacillin  06/26/22- left hip  fluid culture NG 08/05/22- L Hip fluid culture NG  11/27/22 staph epidermidis  01/01/23 superficial culture  staph epi and corynebacterium- skin contaminants  11/05/23 MRSA  11/06/23 MRSA ? 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 Rifampin stopped on 10/31 due to rash He  completed 6 weeks of dapto  on 08/07/22, and then  on Keflex and bactrim starting 08/08/22,  Both antibiotics DC on 11/17/22 because there was concern for rash and it was changed to Po doxy 100mg  BID  Doxy stopped on 12/16/2022 after nearly 6 months of antibiotics.  As patient wanted to stop and see how he would do.  Restarted 1st week in sept 2024 by Dr.Menz because of recurrence of discarge- no culture sent He stopped after couple of weeks I saw him in Jan 2025 for a soft swelling at the surgical site-and it was MRSA on 2 different cultures On doxy His son had MRSA left hip PJI last year and he lives at his place. Pt will need explantaiton of the hardware- HE is willing for  one stage procedure but not 2 stage Will discuss with Dr.Hooten, he would be a candidate for ortho [place] After discussing with ortho would consider IV weekly dalbavancin and PO rifampin if surgery is not an option   Trigeminal neuralgia  HTN    Papulo pustular rash on back- ? Acne\? Folliculitis -   Discussed the management with patient and his wife

## 2023-12-31 DIAGNOSIS — Z0000 Encounter for general adult medical examination without abnormal findings: Secondary | ICD-10-CM | POA: Diagnosis not present

## 2023-12-31 DIAGNOSIS — E538 Deficiency of other specified B group vitamins: Secondary | ICD-10-CM | POA: Diagnosis not present

## 2023-12-31 DIAGNOSIS — M009 Pyogenic arthritis, unspecified: Secondary | ICD-10-CM | POA: Diagnosis not present

## 2023-12-31 DIAGNOSIS — E7849 Other hyperlipidemia: Secondary | ICD-10-CM | POA: Diagnosis not present

## 2023-12-31 DIAGNOSIS — K219 Gastro-esophageal reflux disease without esophagitis: Secondary | ICD-10-CM | POA: Diagnosis not present

## 2023-12-31 DIAGNOSIS — Z79899 Other long term (current) drug therapy: Secondary | ICD-10-CM | POA: Diagnosis not present

## 2023-12-31 DIAGNOSIS — R053 Chronic cough: Secondary | ICD-10-CM | POA: Diagnosis not present

## 2024-01-19 DIAGNOSIS — Z96649 Presence of unspecified artificial hip joint: Secondary | ICD-10-CM | POA: Diagnosis not present

## 2024-01-19 DIAGNOSIS — T8452XD Infection and inflammatory reaction due to internal left hip prosthesis, subsequent encounter: Secondary | ICD-10-CM | POA: Diagnosis not present

## 2024-01-19 DIAGNOSIS — Z96642 Presence of left artificial hip joint: Secondary | ICD-10-CM | POA: Diagnosis not present

## 2024-02-01 DIAGNOSIS — K219 Gastro-esophageal reflux disease without esophagitis: Secondary | ICD-10-CM | POA: Diagnosis not present

## 2024-02-01 DIAGNOSIS — M25452 Effusion, left hip: Secondary | ICD-10-CM | POA: Diagnosis not present

## 2024-02-01 DIAGNOSIS — R053 Chronic cough: Secondary | ICD-10-CM | POA: Diagnosis not present

## 2024-02-01 DIAGNOSIS — T8452XD Infection and inflammatory reaction due to internal left hip prosthesis, subsequent encounter: Secondary | ICD-10-CM | POA: Diagnosis not present

## 2024-02-01 DIAGNOSIS — R1312 Dysphagia, oropharyngeal phase: Secondary | ICD-10-CM | POA: Diagnosis not present

## 2024-02-15 DIAGNOSIS — L739 Follicular disorder, unspecified: Secondary | ICD-10-CM | POA: Diagnosis not present

## 2024-02-15 DIAGNOSIS — R1312 Dysphagia, oropharyngeal phase: Secondary | ICD-10-CM | POA: Diagnosis not present

## 2024-02-15 DIAGNOSIS — R21 Rash and other nonspecific skin eruption: Secondary | ICD-10-CM | POA: Diagnosis not present

## 2024-02-15 DIAGNOSIS — K219 Gastro-esophageal reflux disease without esophagitis: Secondary | ICD-10-CM | POA: Diagnosis not present

## 2024-02-23 DIAGNOSIS — L739 Follicular disorder, unspecified: Secondary | ICD-10-CM | POA: Diagnosis not present

## 2024-02-23 DIAGNOSIS — L309 Dermatitis, unspecified: Secondary | ICD-10-CM | POA: Diagnosis not present

## 2024-02-25 DIAGNOSIS — R0602 Shortness of breath: Secondary | ICD-10-CM | POA: Diagnosis not present

## 2024-02-25 DIAGNOSIS — R053 Chronic cough: Secondary | ICD-10-CM | POA: Diagnosis not present

## 2024-02-25 DIAGNOSIS — K219 Gastro-esophageal reflux disease without esophagitis: Secondary | ICD-10-CM | POA: Diagnosis not present

## 2024-02-25 DIAGNOSIS — R059 Cough, unspecified: Secondary | ICD-10-CM | POA: Diagnosis not present

## 2024-02-25 DIAGNOSIS — R058 Other specified cough: Secondary | ICD-10-CM | POA: Diagnosis not present

## 2024-03-02 DIAGNOSIS — K219 Gastro-esophageal reflux disease without esophagitis: Secondary | ICD-10-CM | POA: Diagnosis not present

## 2024-03-02 DIAGNOSIS — R1312 Dysphagia, oropharyngeal phase: Secondary | ICD-10-CM | POA: Diagnosis not present

## 2024-03-02 DIAGNOSIS — M009 Pyogenic arthritis, unspecified: Secondary | ICD-10-CM | POA: Diagnosis not present

## 2024-03-02 DIAGNOSIS — T364X5A Adverse effect of tetracyclines, initial encounter: Secondary | ICD-10-CM | POA: Diagnosis not present

## 2024-03-02 DIAGNOSIS — R058 Other specified cough: Secondary | ICD-10-CM | POA: Diagnosis not present

## 2024-03-02 DIAGNOSIS — Z860101 Personal history of adenomatous and serrated colon polyps: Secondary | ICD-10-CM | POA: Diagnosis not present

## 2024-03-08 ENCOUNTER — Encounter: Payer: Self-pay | Admitting: Internal Medicine

## 2024-03-14 DIAGNOSIS — R053 Chronic cough: Secondary | ICD-10-CM | POA: Diagnosis not present

## 2024-03-14 DIAGNOSIS — K219 Gastro-esophageal reflux disease without esophagitis: Secondary | ICD-10-CM | POA: Diagnosis not present

## 2024-03-14 DIAGNOSIS — R0602 Shortness of breath: Secondary | ICD-10-CM | POA: Diagnosis not present

## 2024-03-14 DIAGNOSIS — J8283 Eosinophilic asthma: Secondary | ICD-10-CM | POA: Diagnosis not present

## 2024-03-14 DIAGNOSIS — R059 Cough, unspecified: Secondary | ICD-10-CM | POA: Diagnosis not present

## 2024-03-15 ENCOUNTER — Encounter: Payer: Self-pay | Admitting: Internal Medicine

## 2024-03-15 ENCOUNTER — Ambulatory Visit
Admission: RE | Admit: 2024-03-15 | Discharge: 2024-03-15 | Disposition: A | Discharge status: Home / Self Care | Attending: Internal Medicine | Admitting: Internal Medicine

## 2024-03-15 ENCOUNTER — Ambulatory Visit

## 2024-03-15 ENCOUNTER — Other Ambulatory Visit: Payer: Self-pay

## 2024-03-15 ENCOUNTER — Encounter
Admission: RE | Disposition: A | Discharge status: Home / Self Care | Payer: Self-pay | Source: Home / Self Care | Attending: Internal Medicine

## 2024-03-15 DIAGNOSIS — Z79899 Other long term (current) drug therapy: Secondary | ICD-10-CM | POA: Insufficient documentation

## 2024-03-15 DIAGNOSIS — R1312 Dysphagia, oropharyngeal phase: Secondary | ICD-10-CM | POA: Diagnosis not present

## 2024-03-15 DIAGNOSIS — Z85528 Personal history of other malignant neoplasm of kidney: Secondary | ICD-10-CM | POA: Diagnosis not present

## 2024-03-15 DIAGNOSIS — K219 Gastro-esophageal reflux disease without esophagitis: Secondary | ICD-10-CM | POA: Insufficient documentation

## 2024-03-15 DIAGNOSIS — K222 Esophageal obstruction: Secondary | ICD-10-CM | POA: Insufficient documentation

## 2024-03-15 DIAGNOSIS — Z8673 Personal history of transient ischemic attack (TIA), and cerebral infarction without residual deficits: Secondary | ICD-10-CM | POA: Insufficient documentation

## 2024-03-15 DIAGNOSIS — R1313 Dysphagia, pharyngeal phase: Secondary | ICD-10-CM | POA: Diagnosis not present

## 2024-03-15 DIAGNOSIS — I10 Essential (primary) hypertension: Secondary | ICD-10-CM | POA: Diagnosis not present

## 2024-03-15 DIAGNOSIS — K2970 Gastritis, unspecified, without bleeding: Secondary | ICD-10-CM | POA: Insufficient documentation

## 2024-03-15 HISTORY — PX: MALONEY DILATION: SHX5535

## 2024-03-15 HISTORY — PX: ESOPHAGOGASTRODUODENOSCOPY: SHX5428

## 2024-03-15 SURGERY — EGD (ESOPHAGOGASTRODUODENOSCOPY)
Anesthesia: General

## 2024-03-15 MED ORDER — PROPOFOL 1000 MG/100ML IV EMUL
INTRAVENOUS | Status: AC
Start: 2024-03-15 — End: ?
  Filled 2024-03-15: qty 200

## 2024-03-15 MED ORDER — LIDOCAINE HCL (CARDIAC) PF 100 MG/5ML IV SOSY
PREFILLED_SYRINGE | INTRAVENOUS | Status: DC | PRN
  Administered 2024-03-15: 80 mg via INTRAVENOUS

## 2024-03-15 MED ORDER — GLYCOPYRROLATE 0.2 MG/ML IJ SOLN
INTRAMUSCULAR | Status: AC
  Filled 2024-03-15: qty 1

## 2024-03-15 MED ORDER — LIDOCAINE HCL (PF) 2 % IJ SOLN
INTRAMUSCULAR | Status: AC
  Filled 2024-03-15: qty 5

## 2024-03-15 MED ORDER — SODIUM CHLORIDE 0.9 % IV SOLN: INTRAVENOUS | Status: DC

## 2024-03-15 MED ORDER — PHENYLEPHRINE 80 MCG/ML (10ML) SYRINGE FOR IV PUSH (FOR BLOOD PRESSURE SUPPORT)
PREFILLED_SYRINGE | INTRAVENOUS | Status: AC
  Filled 2024-03-15: qty 10

## 2024-03-15 MED ORDER — GLYCOPYRROLATE 0.2 MG/ML IJ SOLN
INTRAMUSCULAR | Status: AC
  Filled 2024-03-15: qty 2

## 2024-03-15 MED ORDER — DEXMEDETOMIDINE HCL IN NACL 80 MCG/20ML IV SOLN
INTRAVENOUS | Status: DC | PRN
  Administered 2024-03-15: 12 ug via INTRAVENOUS

## 2024-03-15 MED ORDER — DEXMEDETOMIDINE HCL IN NACL 80 MCG/20ML IV SOLN
INTRAVENOUS | Status: AC
  Filled 2024-03-15: qty 20

## 2024-03-15 MED ORDER — GLYCOPYRROLATE 0.2 MG/ML IJ SOLN
INTRAMUSCULAR | Status: DC | PRN
  Administered 2024-03-15: .2 mg via INTRAVENOUS

## 2024-03-15 MED ORDER — EPHEDRINE 5 MG/ML INJ
INTRAVENOUS | Status: AC
  Filled 2024-03-15: qty 5

## 2024-03-15 MED ORDER — PROPOFOL 10 MG/ML IV BOLUS
INTRAVENOUS | Status: DC | PRN
  Administered 2024-03-15: 60 mg via INTRAVENOUS
  Administered 2024-03-15: 150 ug/kg/min via INTRAVENOUS
  Administered 2024-03-15: 20 mg via INTRAVENOUS

## 2024-03-15 NOTE — Anesthesia Postprocedure Evaluation (Signed)
 Anesthesia Post Note  Patient: Bryan Frey  Procedure(s) Performed: EGD (ESOPHAGOGASTRODUODENOSCOPY) DILATION, ESOPHAGUS, USING MALONEY DILATOR  Patient location during evaluation: Endoscopy Anesthesia Type: General Level of consciousness: awake and alert Pain management: pain level controlled Vital Signs Assessment: post-procedure vital signs reviewed and stable Respiratory status: spontaneous breathing, nonlabored ventilation, respiratory function stable and patient connected to nasal cannula oxygen Cardiovascular status: blood pressure returned to baseline and stable Postop Assessment: no apparent nausea or vomiting Anesthetic complications: no   No notable events documented.   Last Vitals:  Vitals:   03/15/24 0842 03/15/24 0902  BP: (!) 107/55 123/67  Pulse:  68  Resp:    Temp: (!) 36.1 C   SpO2:  98%    Last Pain:  Vitals:   03/15/24 0902  TempSrc:   PainSc: 0-No pain                 Portia Brittle Katricia Prehn

## 2024-03-15 NOTE — Op Note (Signed)
 [facility] Gastroenterology Patient Name: Bryan Frey Procedure Date: 03/15/2024 8:17 AM MRN: [id number] Account #: [id number] Date of Birth: 21-Jul-1946 Admit Type: Outpatient Age: 78 Room: [facility] ENDO ROOM 1 Gender: Male Note Status: Supervisor Override Instrument Name: Upper Endoscope [id number] Procedure:             Upper GI endoscopy Indications:           Pharyngeal phase dysphagia, Gastro-esophageal reflux                         disease Providers:             Samiah Ricklefs K. Salar Molden MD, MD Medicines:             Propofol  per Anesthesia Complications:         No immediate complications. Estimated blood loss: None. Procedure:             Pre-Anesthesia Assessment:                        - The risks and benefits of the procedure and the                         sedation options and risks were discussed with the                         patient. All questions were answered and informed                         consent was obtained.                        - Patient identification and proposed procedure were                         verified prior to the procedure by the nurse. The                         procedure was verified in the procedure room.                        - ASA Grade Assessment: III - A patient with severe                         systemic disease.                        - After reviewing the risks and benefits, the patient                         was deemed in satisfactory condition to undergo the                         procedure.                        After obtaining informed consent, the endoscope was                         passed under direct vision. Throughout the procedure,  the patient's blood pressure, pulse, and oxygen                         saturations were monitored continuously. The Endoscope                         was introduced through the mouth, and advanced to the                         third part of duodenum.  The upper GI endoscopy was                         accomplished without difficulty. The patient tolerated                         the procedure well. Findings:      A non-obstructing Schatzki ring was found in the distal esophagus. The       scope was withdrawn. Dilation was performed with a Maloney dilator with       no resistance at 54 Fr. Estimated blood loss: none.      The exam of the esophagus was otherwise normal.      Patchy mild inflammation characterized by erosions and erythema was       found in the gastric antrum.      The exam of the stomach was otherwise normal.      The examined duodenum was normal. Impression:            - Non-obstructing Schatzki ring. Dilated.                        - Gastritis.                        - Normal examined duodenum.                        - No specimens collected. Recommendation:        - Patient has a contact number available for                         emergencies. The signs and symptoms of potential                         delayed complications were discussed with the patient.                         Return to normal activities tomorrow. Written                         discharge instructions were provided to the patient.                        - Resume previous diet.                        - Continue present medications.                        - Monitor results to esophageal dilation                        -  Return to my office in 3 months.                        - Telephone GI office to schedule appointment.                        - The findings and recommendations were discussed with                         the patient. Procedure Code(s):     --- Professional ---                        [phone], Esophagogastroduodenoscopy, flexible,                         transoral; diagnostic, including collection of                         specimen(s) by brushing or washing, when performed                         (separate procedure)                         43450, Dilation of esophagus, by unguided sound or                         bougie, single or multiple passes Diagnosis Code(s):     --- Professional ---                        R13.13, Dysphagia, pharyngeal phase                        K29.70, Gastritis, unspecified, without bleeding                        K22.2, Esophageal obstruction CPT copyright 2022 [employer]. All rights reserved. The codes documented in this report are preliminary and upon coder review may  be revised to meet current compliance requirements. Cassie Click MD, MD 03/15/2024 8:38:47 AM This report has been signed electronically. Number of Addenda: 0 Note Initiated On: 03/15/2024 8:17 AM Estimated Blood Loss:  Estimated blood loss: none.      [facility]

## 2024-03-15 NOTE — H&P (Signed)
 Outpatient short stay form Pre-procedure 03/15/2024 8:25 AM Bryan Frey, M.D.  Primary Physician: Bryan Frey, M.D.  Reason for visit:  Dysphagia  History of present illness:  Bryan Frey is a pleasant 78 year old male with whom I am familiar from previous contact. The patient complained of dysphagia upon seeing me initially in 2021 and 2022. He underwent a stock barium swallow that suggested mild aspiration. This was followed up in 2022 with the following barium study as noted below:  Patient underwent modified barium swallow with Bryan Frey, SLP on 10/30/20 with the following final impression:  "Pt demonstrates adequate oropharyngeal abilities when  consuming nectar thick liquids, thin liquids, puree and solids and whole  barium tablet. Pt's oral phase is functional with no oral residue present  post swallow. His swallow is initiated at the level of the pyriform  sinuses with no penetration or aspiration observed. Pt reports that he  experiences a globus sensation at the level of clavicle when eating solid  meats like steak. Liquids are helpful in "getting the food to go down." He  also describes severe s/s of heartburn including chest pressure. Education  provided to pt and his wife on reflux precautions. At this time, recommend  age-appropriate diet of pt's comfort, thin liquids and medicine whole with  thin liquids. Given report of reflux symptoms, would consider the addition  of reflux medicine should his MD choose. "  The patient denies any frank heartburn but has been taking pantoprazole  for over a year prescribed to him by Bryan Frey. He says this is improved his purported chronic cough. He denies ever having significant GERD symptoms or "heartburn" or "sour burps". Patient denies any involuntary weight loss, hematemesis. Patient's wife does admit that the patient does snore but denies that he is having any apneic episodes to her knowledge. He is undergoing pulmonary  evaluation with Bryan Frey at [facility] with pulmonary function tests pending.  12/03/2020 Colonoscopy (Bryan Frey): Multiple small tubular adenomas (5) and two hyperplastic polyps.  Patient does have intermittent dysphagia as well in the throat region between the Adam's apple and the suprasternal notch. He does have some cough particularly when in the recumbent position at night. He feels a "rattling sensation" in his chest. No obvious shortness of breath or wheezing. No personal history of COPD or asthma. No smoking history.  Patient underwent extended IV antibiotic therapy for MRSA infection in the left hip and this was transitioned to doxycycline  over subsequent months. Patient was recently removed off doxycycline  secondary to a presumptive rash on his back. He is no longer taking doxycycline  now for several days.     Current Facility-Administered Medications:    0.9 %  sodium chloride  infusion, , Intravenous, Continuous, [place], Bryan Plack K, MD, Last Rate: 20 mL/hr at 03/15/24 0825, Continued from Pre-op at 03/15/24 0825  Medications Prior to Admission  Medication Sig Dispense Refill Last Dose/Taking   amLODipine  (NORVASC ) 10 MG tablet Take 10 mg by mouth daily.   03/14/2024   carbamazepine  (TEGRETOL ) 200 MG tablet Take 400 mg by mouth 3 (three) times daily.    03/14/2024   doxycycline  (VIBRA -TABS) 100 MG tablet Take 1 tablet (100 mg total) by mouth 2 (two) times daily. 60 tablet 1 03/14/2024   DULoxetine  (CYMBALTA ) 60 MG capsule Take 60 mg by mouth 2 (two) times daily.   03/14/2024   hydrochlorothiazide  (HYDRODIURIL ) 25 MG tablet Take 25 mg by mouth daily.   03/14/2024   pantoprazole  (PROTONIX ) 40 MG tablet Take  40 mg by mouth daily.   03/14/2024   acetaminophen  (TYLENOL ) 500 MG tablet Take 1,000 mg by mouth daily.      bacitracin  500 UNIT/GM ointment Apply 1 Application topically 2 (two) times daily. 15 g 0    diphenhydramine -acetaminophen  (TYLENOL  PM) 25-500 MG TABS Take 2  tablets by mouth at bedtime.      ketoconazole (NIZORAL) 2 % cream Apply 1 Application topically daily.      Melatonin 5 MG CAPS Take 5 mg by mouth at bedtime.      tamsulosin  (FLOMAX ) 0.4 MG CAPS capsule Take 1 capsule (0.4 mg total) by mouth daily. (Patient not taking: Reported on 03/15/2024) 30 capsule 1 Not Taking   traMADol  (ULTRAM ) 50 MG tablet Take 1 tablet (50 mg total) by mouth every 4 (four) hours as needed for moderate pain. 30 tablet 0    triamcinolone  (KENALOG ) 0.025 % cream SMARTSIG:Topical Twice Daily   Morning   vitamin B-12 (CYANOCOBALAMIN ) 1000 MCG tablet Take 1,000 mcg by mouth daily.        Allergies  Allergen Reactions   Morphine  And Codeine Other (See Comments)    Hallucinations   Statins     Other reaction(s): Muscle Pain Other reaction(s): Muscle Pain     Past Medical History:  Diagnosis Date   Acquired short leg syndrome on left    Anemia    vitamin b12 deficiency   Anxiety    with diagnosis   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 removed tumor and all is clear since then   Nephrolithiasis    Neuromuscular disorder (HCC)    facial nerve pain   Spinal stenosis of lumbar region    with neurogenic claudication   Stroke (HCC) 2010   heat stroke d/t a hot day and working under a house.no problems after   Trigeminal neuralgia     Review of systems:  Otherwise negative.    Physical Exam  Gen: Alert, oriented. Appears stated age.  HEENT: [place]/AT. PERRLA. Lungs: CTA, no wheezes. CV: RR nl S1, S2. Abd: soft, benign, no masses. BS+ Ext: No edema. Pulses 2+    Planned procedures: Proceed with EGD.  The patient understands the nature of the  planned procedure, indications, risks, alternatives and potential complications including but not limited to bleeding, infection, perforation, damage to internal organs and possible oversedation/side effects from anesthesia. The patient agrees and gives consent to proceed.  Please refer to procedure notes for findings, recommendations and patient disposition/instructions.     Kasen Sako Frey. Corky Frey, M.D. Gastroenterology 03/15/2024  8:25 AM

## 2024-03-15 NOTE — Interval H&P Note (Signed)
 History and Physical Interval Note:  03/15/2024 8:27 AM  Zona Hippo III  has presented today for surgery, with the diagnosis of Oropharyngeal dysphagia (R13.12) Recurrent cough (R05.8) Gastroesophageal reflux disease, unspecified whether esophagitis present (K21.9).  The various methods of treatment have been discussed with the patient and family. After consideration of risks, benefits and other options for treatment, the patient has consented to  Procedure(s) with comments: EGD (ESOPHAGOGASTRODUODENOSCOPY) (N/A) - Request Early AM Appt. as a surgical intervention.  The patient's history has been reviewed, patient examined, no change in status, stable for surgery.  I have reviewed the patient's chart and labs.  Questions were answered to the patient's satisfaction.     [place], Bryan Frey

## 2024-03-15 NOTE — Transfer of Care (Signed)
 Immediate Anesthesia Transfer of Care Note  Patient: Bryan Frey  Procedure(s) Performed: EGD (ESOPHAGOGASTRODUODENOSCOPY) DILATION, ESOPHAGUS, USING MALONEY DILATOR  Patient Location: PACU  Anesthesia Type:General  Level of Consciousness: drowsy  Airway & Oxygen Therapy: Patient Spontanous Breathing  Post-op Assessment: Report given to RN, Post -op Vital signs reviewed and stable, and Patient moving all extremities X 4  Post vital signs: Reviewed  Last Vitals:  Vitals Value Taken Time  BP 107/55 03/15/24 0843  Temp 36.1 C 03/15/24 0842  Pulse 70 03/15/24 0845  Resp 20 03/15/24 0845  SpO2 94 % 03/15/24 0845  Vitals shown include unfiled device data.  Last Pain:  Vitals:   03/15/24 0842  TempSrc: Temporal  PainSc: Asleep         Complications: No notable events documented.

## 2024-03-15 NOTE — Anesthesia Preprocedure Evaluation (Addendum)
 Anesthesia Evaluation  Patient identified by MRN, date of birth, ID band Patient awake    Reviewed: Allergy & Precautions, NPO status , Patient's Chart, lab work & pertinent test results  History of Anesthesia Complications Negative for: history of anesthetic complications  Airway Mallampati: III  TM Distance: <3 FB Neck ROM: full    Dental  (+) Chipped, Poor Dentition, Missing   Pulmonary neg pulmonary ROS, neg shortness of breath   Pulmonary exam normal        Cardiovascular Exercise Tolerance: Good hypertension, (-) angina Normal cardiovascular exam     Neuro/Psych  Neuromuscular disease CVA  negative psych ROS   GI/Hepatic negative GI ROS, Neg liver ROS,neg GERD  ,,  Endo/Other  negative endocrine ROS    Renal/GU Renal disease  negative genitourinary   Musculoskeletal   Abdominal   Peds  Hematology negative hematology ROS (+)   Anesthesia Other Findings Patient reports that they do not think that any food or pills are stuck in their throat at this time.  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 removed tumor and all is clear since               then No date: Nephrolithiasis No date: Neuromuscular disorder (HCC)     Comment:  facial nerve pain No date: Spinal stenosis of lumbar region     Comment:  with neurogenic claudication 2010: Stroke  (HCC)     Comment:  heat stroke d/t a hot day and working under a house.no               problems after No date: Trigeminal neuralgia  Past Surgical History: No date: BACK SURGERY 12/24/2017: CATARACT EXTRACTION W/PHACO; Left     Comment:  Procedure: CATARACT EXTRACTION PHACO AND INTRAOCULAR               LENS PLACEMENT (IOC);  Surgeon: Rosa College, MD;                Location: [facility] ORS;  Service: Ophthalmology;  Laterality:              Left;  Lot # [id number] H US : 00:30.4 AP%: 9.4 CDE: 2.84 01/26/2018: CATARACT EXTRACTION W/PHACO; Right     Comment:  Procedure: CATARACT EXTRACTION PHACO AND INTRAOCULAR               LENS PLACEMENT (IOC) RIGHT;  Surgeon: Rosa College,              MD;  Location: [facility];  Service:               Ophthalmology;  Laterality: Right; No date: COLONOSCOPY 12/03/2020: COLONOSCOPY     Comment:  Procedure: COLONOSCOPY;  Surgeon: Toledo, Teodoro K, MD;              Location: [facility];  Service: Gastroenterology;;  No date: EYE SURGERY; Bilateral     Comment:  cataract extra tions 06/06/2022: INCISION AND DRAINAGE HIP; Left     Comment:  Procedure: IRRIGATION AND DEBRIDEMENT OF LEFT HIP WITH               FEMORAL HEAD EXCHANGE;  Surgeon: Arlyne Lame, MD;                Location: [facility] OR[facility];  Service: Orthopedics;  Laterality:               Left; 03/18/2017: IR RADIOLOGIST EVAL & MGMT No date: JOINT REPLACEMENT; Bilateral     Comment:  left hip replaced x 2, right hip x 1 08/07/2010: Left total hip arthroplasty 02/09/2012: LUMBAR LAMINECTOMY/DECOMPRESSION MICRODISCECTOMY     Comment:  Procedure: LUMBAR LAMINECTOMY/DECOMPRESSION               MICRODISCECTOMY 2 LEVELS;  Surgeon: Elna Haggis, MD;                Location: [facility];  Service: Neurosurgery;                Laterality: Bilateral;  Bilateral Lumbar two-three,lumbar              four-five laminectomies 02/09/2014: NEPHROLITHOTOMY; Right     Comment:  Procedure:  NEPHROLITHOTOMY PERCUTANEOUS;  Surgeon:               Trent Frizzle, MD;  Location: [facility];  Service:               Urology;  Laterality: Right; 07/13/2014: Right total hip arthroplasty 05/14/2015: TOTAL HIP REVISION; Left     Comment:  Procedure: TOTAL HIP REVISION;  Surgeon: Arlyne Lame,              MD;  Location: [facility];  Service: Orthopedics;                Laterality: Left; 04/06/2020: TOTAL HIP REVISION; Left     Comment:  Procedure: TOTAL HIP REVISION;  Surgeon: Arlyne Lame, MD;  Location: [facility] OR[facility];  Service: Orthopedics;                Laterality: Left;     Reproductive/Obstetrics negative OB ROS                             Anesthesia Physical Anesthesia Plan  ASA: 3  Anesthesia Plan: General   Post-op Pain Management:    Induction: Intravenous  PONV Risk Score and Plan: Propofol  infusion and TIVA  Airway Management Planned: Natural Airway and Nasal Cannula  Additional Equipment:   Intra-op Plan:   Post-operative Plan:   Informed Consent: I have reviewed the patients History and Physical, chart, labs and discussed the procedure including the risks, benefits and alternatives for the proposed anesthesia with the patient or authorized representative who has indicated his/her understanding and acceptance.     Dental Advisory Given  Plan Discussed with: Anesthesiologist, CRNA and Surgeon  Anesthesia Plan Comments: (Patient consented for risks of anesthesia including but not limited to:  - adverse reactions to medications - risk of airway placement if required - damage to eyes, teeth, lips or other oral mucosa - nerve damage due to positioning  - sore throat or hoarseness - Damage to heart, brain, nerves, lungs, other parts of  body or loss of life  Patient voiced understanding and assent.)       Anesthesia Quick Evaluation

## 2024-04-01 DIAGNOSIS — B368 Other specified superficial mycoses: Secondary | ICD-10-CM | POA: Diagnosis not present

## 2024-04-01 DIAGNOSIS — L570 Actinic keratosis: Secondary | ICD-10-CM | POA: Diagnosis not present

## 2024-04-01 DIAGNOSIS — L821 Other seborrheic keratosis: Secondary | ICD-10-CM | POA: Diagnosis not present

## 2024-04-01 DIAGNOSIS — R208 Other disturbances of skin sensation: Secondary | ICD-10-CM | POA: Diagnosis not present

## 2024-04-01 DIAGNOSIS — D1801 Hemangioma of skin and subcutaneous tissue: Secondary | ICD-10-CM | POA: Diagnosis not present

## 2024-04-01 DIAGNOSIS — L820 Inflamed seborrheic keratosis: Secondary | ICD-10-CM | POA: Diagnosis not present

## 2024-04-04 DIAGNOSIS — G629 Polyneuropathy, unspecified: Secondary | ICD-10-CM | POA: Diagnosis not present

## 2024-04-19 DIAGNOSIS — M009 Pyogenic arthritis, unspecified: Secondary | ICD-10-CM | POA: Diagnosis not present

## 2024-04-19 DIAGNOSIS — Z471 Aftercare following joint replacement surgery: Secondary | ICD-10-CM | POA: Diagnosis not present

## 2024-04-19 DIAGNOSIS — Z96642 Presence of left artificial hip joint: Secondary | ICD-10-CM | POA: Diagnosis not present

## 2024-05-10 DIAGNOSIS — T8452XD Infection and inflammatory reaction due to internal left hip prosthesis, subsequent encounter: Secondary | ICD-10-CM | POA: Diagnosis not present

## 2024-05-10 DIAGNOSIS — Z96642 Presence of left artificial hip joint: Secondary | ICD-10-CM | POA: Diagnosis not present

## 2024-05-10 DIAGNOSIS — Z96649 Presence of unspecified artificial hip joint: Secondary | ICD-10-CM | POA: Diagnosis not present

## 2024-05-10 DIAGNOSIS — T84031A Mechanical loosening of internal left hip prosthetic joint, initial encounter: Secondary | ICD-10-CM | POA: Diagnosis not present

## 2024-05-10 DIAGNOSIS — T84031D Mechanical loosening of internal left hip prosthetic joint, subsequent encounter: Secondary | ICD-10-CM | POA: Diagnosis not present

## 2024-05-17 ENCOUNTER — Emergency Department

## 2024-05-17 ENCOUNTER — Other Ambulatory Visit: Payer: Self-pay

## 2024-05-17 ENCOUNTER — Emergency Department
Admission: EM | Admit: 2024-05-17 | Discharge: 2024-05-17 | Disposition: A | Discharge status: Home / Self Care | Attending: Emergency Medicine | Admitting: Emergency Medicine

## 2024-05-17 DIAGNOSIS — I700 Atherosclerosis of aorta: Secondary | ICD-10-CM | POA: Diagnosis not present

## 2024-05-17 DIAGNOSIS — M545 Low back pain, unspecified: Secondary | ICD-10-CM | POA: Diagnosis not present

## 2024-05-17 DIAGNOSIS — G8911 Acute pain due to trauma: Secondary | ICD-10-CM | POA: Insufficient documentation

## 2024-05-17 DIAGNOSIS — M47816 Spondylosis without myelopathy or radiculopathy, lumbar region: Secondary | ICD-10-CM | POA: Diagnosis not present

## 2024-05-17 DIAGNOSIS — Z85528 Personal history of other malignant neoplasm of kidney: Secondary | ICD-10-CM | POA: Diagnosis not present

## 2024-05-17 DIAGNOSIS — W01198A Fall on same level from slipping, tripping and stumbling with subsequent striking against other object, initial encounter: Secondary | ICD-10-CM | POA: Insufficient documentation

## 2024-05-17 DIAGNOSIS — I10 Essential (primary) hypertension: Secondary | ICD-10-CM | POA: Diagnosis not present

## 2024-05-17 DIAGNOSIS — Z043 Encounter for examination and observation following other accident: Secondary | ICD-10-CM | POA: Diagnosis not present

## 2024-05-17 DIAGNOSIS — Z96643 Presence of artificial hip joint, bilateral: Secondary | ICD-10-CM | POA: Diagnosis not present

## 2024-05-17 DIAGNOSIS — Z8673 Personal history of transient ischemic attack (TIA), and cerebral infarction without residual deficits: Secondary | ICD-10-CM | POA: Insufficient documentation

## 2024-05-17 DIAGNOSIS — M5459 Other low back pain: Secondary | ICD-10-CM | POA: Diagnosis not present

## 2024-05-17 MED ORDER — PREDNISONE 10 MG (21) PO TBPK: ORAL_TABLET | ORAL | 0 refills | Status: DC

## 2024-05-17 MED ORDER — OXYCODONE HCL 5 MG PO TABS
5.0000 mg | ORAL_TABLET | Freq: Once | ORAL | Status: AC
  Administered 2024-05-17 (×2): 5 mg via ORAL
  Filled 2024-05-17: qty 1

## 2024-05-17 MED ORDER — ONDANSETRON 4 MG PO TBDP
4.0000 mg | ORAL_TABLET | Freq: Once | ORAL | Status: AC
  Administered 2024-05-17 (×2): 4 mg via ORAL
  Filled 2024-05-17: qty 1

## 2024-05-17 MED ORDER — FENTANYL CITRATE PF 50 MCG/ML IJ SOSY
50.0000 ug | PREFILLED_SYRINGE | Freq: Once | INTRAMUSCULAR | Status: AC
  Administered 2024-05-17 (×2): 50 ug via INTRAVENOUS
  Filled 2024-05-17: qty 1

## 2024-05-17 MED ORDER — HYDROCODONE-ACETAMINOPHEN 5-325 MG PO TABS: 1.0000 | ORAL_TABLET | Freq: Four times a day (QID) | ORAL | 0 refills | Status: AC | PRN

## 2024-05-17 NOTE — ED Notes (Signed)
 Pt transported to Xray at this time.

## 2024-05-17 NOTE — Discharge Instructions (Addendum)
 If you take the tramadol , do not take the hydrocodone  that was prescribed today.  You will need to use your walker at all times if having to take the pain medication.  It may make you sleepy, dizzy, or more drowsy.  Take the prednisone  as prescribed until finished.  Follow-up with primary care, orthopedics, return to the emergency department for symptoms of concern.

## 2024-05-17 NOTE — ED Notes (Signed)
 Pt assisted onto the bed and positioned in a position of comfort

## 2024-05-17 NOTE — ED Triage Notes (Addendum)
 First nurse note: Pt to ED via [facility] from home. Pt had fell on Saturday evening, hit his head but no LOC. No blood thinners. Pt reports lower back pain from fall and has fused L4-L6. Pt denies head or neck pain  110/50 1g tylenol  20g  98% RA CBG 118 ET 36 RR 18

## 2024-05-17 NOTE — ED Provider Notes (Signed)
 [facility] Provider Note    Event Date/Time   First MD Initiated Contact with Patient 05/17/24 1324     (approximate)   History   Fall (/) and Back Pain   HPI  Bryan Frey is a 78 y.o. male with history of renal carcinoma, hypertension, neuromuscular disorder, CVA, degenerative disc disease, and as listed in the EMR presents to the emergency department for evaluation of low back pain after a mechanical, nonsyncopal fall 3 days ago.  He states he was taking a trash bag out of a trash can lost his balance and fell backward on the door frame.  He hit his back denies head and slid down.  He denies loss of consciousness.  He has a history of lumbar fusion and bilateral hip replacement.  Over the past 3 days pain in his back over the site of his back surgery has been increasingly painful to the point he has been unable to get up and down by himself.  He decided to come to the emergency department due to the intensity of the pain.  No relief with tramadol .     Physical Exam    Vitals:   05/17/24 1324 05/17/24 1605  BP:  (!) 113/59  Pulse:  71  Resp:  20  Temp:    SpO2: 98% 97%    General: Awake, no distress.  CV:  Good peripheral perfusion.  Resp:  Normal effort.  Abd:  No distention.  Other:  No contusions, abrasions, or erythema over the lower back.  No focal pain over the mid lumbar area.  Able to demonstrate range of motion of both lower extremities.   ED Results / Procedures / Treatments   Labs (all labs ordered are listed, but only abnormal results are displayed)  Labs Reviewed - No data to display   EKG  Not indicated   RADIOLOGY  Image and radiology report reviewed and interpreted by me. Radiology report consistent with the same.  Images of the lumbar spine is negative for acute concerns.  PROCEDURES:  Critical Care performed: No  Procedures   MEDICATIONS ORDERED IN ED:  Medications  oxyCODONE  (Oxy IR/ROXICODONE )  immediate release tablet 5 mg (5 mg Oral Given 05/17/24 1418)  ondansetron  (ZOFRAN -ODT) disintegrating tablet 4 mg (4 mg Oral Given 05/17/24 1418)  fentaNYL  (SUBLIMAZE ) injection 50 mcg (50 mcg Intravenous Given 05/17/24 1558)     IMPRESSION / MDM / ASSESSMENT AND PLAN / ED COURSE   I have reviewed the triage note and vital signs. Vital signs are stable.   Differential diagnosis includes, but is not limited to, contusion, disruption of lumbar fusion, new fracture with the lumbar vertebrae.  Patient's presentation is most consistent with acute illness / injury with system symptoms.  78 year old male presenting 3 days after mechanical, nonsyncopal fall.  See HPI for further details.  Exam was overall reassuring.  Images of the lumbar spine are pending.  Clinical Course as of 05/17/24 1743  Tue May 17, 2024  1614 Imaging negative for acute concerns.  Current symptoms likely secondary to inflammation that has developed since his fall.  Wife states that he was able to get up with his walker for a day or so after the fall but has gradually had worsening pain. [CT]    Clinical Course User Index [CT] Valeda Corzine B, FNP   After the Fentanyl , patient was able to use walker to get up, bear weight and take a few steps to the wheelchair  and was then able to get up to the bathroom. He feels comfortable with plan of discharge. Prescription for tapered prednisone  pack and Norco submitted to the patient's pharmacy.  He was advised not to take the tramadol  that he has at home if he is taking Norco.  He was also advised that he is at greater risk for additional falls while taking Norco and is to use his walker at all times. He is agreeable to the plan.   FINAL CLINICAL IMPRESSION(S) / ED DIAGNOSES   Final diagnoses:  Acute midline low back pain without sciatica    Rx / DC Orders   ED Discharge Orders          Ordered    predniSONE  (STERAPRED UNI-PAK 21 TAB) 10 MG (21) TBPK tablet         05/17/24 1707    HYDROcodone -acetaminophen  (NORCO/VICODIN) 5-325 MG tablet  Every 6 hours PRN        05/17/24 1707             Note:  This document was prepared using Dragon voice recognition software and may include unintentional dictation errors.   Herlinda Kirk NOVAK, FNP 05/17/24 1744    Willo Dunnings, MD 05/17/24 GENNIE

## 2024-05-31 DIAGNOSIS — J449 Chronic obstructive pulmonary disease, unspecified: Secondary | ICD-10-CM | POA: Diagnosis not present

## 2024-05-31 DIAGNOSIS — R053 Chronic cough: Secondary | ICD-10-CM | POA: Diagnosis not present

## 2024-05-31 DIAGNOSIS — M549 Dorsalgia, unspecified: Secondary | ICD-10-CM | POA: Diagnosis not present

## 2024-05-31 NOTE — Progress Notes (Signed)
 DIVISION OF PULMONARY AND CRITICAL CARE MEDICINE                              FOLLOW UP ENCOUNTER     Chief complaint: COPD with chronic dyspnea and severe back pain.  History of Present Illness Bryan Frey is a 78 year old male who presents with back pain after a fall.  He experiences significant back pain following a fall next to the door, landing on his back. He has hardware in his back from a previous surgery. Imaging performed in the emergency room showed no abnormalities, and his back doctor's nurse confirmed that the x-rays looked fine. Despite this, he continues to experience 'massive pain'.  He reports a reduction in coughing since using a replacement inhaler, which he believes is effective. He denies any recent COVID-19 infections. His wife has noticed a 'crackling noise' when he sleeps, although he does not cough as much as before. He uses Trelegy once daily in the morning and has discontinued Advair, as he did not notice any benefit from it. An injectable medication for lung inflammation was denied by his insurance, but he feels much better and does not believe he needs it anymore.  He has experienced some eye irritation, which he attributes to allergies, and has been using eye drops to manage the symptoms.   Past Medical History:   Past Medical History:  Diagnosis Date  . Chickenpox   . Erectile dysfunction   . History of colonoscopy   . Hyperlipidemia   . Hypertension   . Internuclear ophthalmoplegia   . Kidney stones   . Leg length discrepancy   . S/P myelogram 05/20/2016   Dr Colon  . Spinal stenosis of lumbar region   . Trigeminal neuralgia 10/13/2016   Controlled with carbamazepine     Past Surgical History:   Past Surgical History:  Procedure Laterality Date  . Left total hip arthroplasty  08/07/2010  . Right total hip arthroplasty  07/10/2014  . Revision of left acetabular component  05/14/2015   Dr.Hooten   . ANTERIOR  FUSION LUMBAR SPINE  2017   L2-5  .  Left hip revision arthroplasty (acetabular component)  04/06/2020   Dr Mardee  . COLONOSCOPY  12/03/2020   Tubular adenomas/Hyperplastic polyps/Repeat 72yrs/TKT  . Irrigation and debridement of left hip with placement of Stimulan antibiotic impregnated beads  06/06/2022   Dr Mardee  . EGD @ [facility]  03/15/2024   Non-obstrcuting Schatzki ring/ Dilated/ Gastritis/ otherwsie normal/ repeat PRN  . BACK SURGERY     LUMBOSACRAL SPINE SURGERY    Allergies:   Allergies  Allergen Reactions  . Codeine Other (See Comments)    Hallucinations  . Morphine  Hallucination    Other Reaction(s): Other (See Comments), Other (See Comments)    Hallucinations  . Other Anxiety    Other reaction(s): Muscle Pain Other reaction(s): Muscle Pain  . Statins-Hmg-Coa Reductase Inhibitors Muscle Pain    Current Medications:   Prior to Admission medications  Medication Sig Taking? Last Dose  acetaminophen  (TYLENOL ) 500 MG tablet Take 1,000 mg by mouth nightly as needed    Yes Taking  amLODIPine  (NORVASC ) 10 MG tablet Take 10 mg by mouth once daily Yes Taking  carBAMazepine  (TEGRETOL ) 200 mg tablet Take  2 tablets (400 mg total) by mouth 3 (three) times daily Yes Taking  cholecalciferol (VITAMIN D3) 1,250 mcg (50,000 unit) capsule Take 1 capsule (50,000 Units total) by mouth once a week Yes Taking  diphenhydrAMINE -acetaminophen  (TYLENOL  PM) 25-500 mg per tablet Take 1 tablet by mouth nightly as needed    Yes Taking  DULoxetine  (CYMBALTA ) 60 MG DR capsule Take 60 mg by mouth once daily Yes Taking  fluticasone propionate (FLONASE) 50 mcg/actuation nasal spray Place 2 sprays into both nostrils once daily Yes Taking  fluticasone-umeclidinium-vilanterol (TRELEGY ELLIPTA) 200-62.5-25 mcg inhaler Inhale 1 Puff into the lungs once daily Yes Taking  gabapentin  (NEURONTIN ) 100 MG capsule Take 1 capsule (100 mg total) by mouth 2 (two) times daily Yes Taking  hydroCHLOROthiazide   (HYDRODIURIL ) 25 MG tablet Take 25 mg by mouth once daily Yes Taking  ketoconazole (NIZORAL) 2 % cream APPLY TO BACK TWICE DAILY UNTIL CLEAR THEN CONTINUE FOR ONE WEEK Yes Taking  metoclopramide  (REGLAN ) 5 MG tablet 1 tab a.m., 1 tab at bedtime Yes Taking  pantoprazole  (PROTONIX ) 40 MG DR tablet Take 1 tablet (40 mg total) by mouth 2 (two) times daily before meals Yes Taking  traMADoL  (ULTRAM ) 50 mg tablet Take 50 mg by mouth once daily Yes Taking  triamcinolone  0.025 % cream Apply topically 2 (two) times daily Yes Taking  doxycycline  (VIBRAMYCIN ) 100 MG capsule Take 100 mg by mouth 2 (two) times daily Patient not taking: Reported on 05/31/2024  Not Taking  fluticasone propion-salmeteroL (ADVAIR DISKUS) 250-50 mcg/dose diskus inhaler Inhale 1 Puff into the lungs every 12 (twelve) hours Patient not taking: Reported on 05/31/2024  Not Taking  HYDROcodone -acetaminophen  (NORCO) 7.5-325 mg tablet Take 1 tablet by mouth every 6 (six) hours as needed for Pain (severe back pain) for up to 20 days      Family History:   Family History  Problem Relation Name Age of Onset  . Arthritis Mother    . Diabetes Father    . Diabetes Sister    . Myopathy Neg Hx      Social History:   Social History   Socioeconomic History  . Marital status: Married    Spouse name: Verneita  . Number of children: 2  . Years of education: 16  . Highest education level: Bachelor's degree (e.g., BA, AB, BS)  Occupational History  . Occupation: RetiredArts development officer  Tobacco Use  . Smoking status: Never    Passive exposure: Never  . Smokeless tobacco: Never  Vaping Use  . Vaping status: Never Used  Substance and Sexual Activity  . Alcohol use: Never  . Drug use: No  . Sexual activity: Yes    Partners: Female  Social History Narrative   Lives in [place]. Married. He is active in church. Denies smoking and alcohol intake.     Social Drivers of Corporate investment banker Strain: Low Risk  (05/10/2024)   Overall  Financial Resource Strain (CARDIA)   . Difficulty of Paying Living Expenses: Not hard at all  Food Insecurity: No Food Insecurity (05/10/2024)   Hunger Vital Sign   . Worried About Programme researcher, broadcasting/film/video in the Last Year: Never true   . Ran Out of Food in the Last Year: Never true  Transportation Needs: No Transportation Needs (05/10/2024)   PRAPARE - Transportation   . Lack of Transportation (Medical): No   . Lack of Transportation (Non-Medical): No  Housing Stability: Low Risk  (05/10/2024)   Housing Stability Vital Sign   .  Unable to Pay for Housing in the Last Year: No   . Number of Times Moved in the Last Year: 0   . Homeless in the Last Year: No    Review of Systems:   A 10 point review of systems is negative, except for the pertinent positives and negatives detailed in the HPI.  Vitals:   Vitals:   05/31/24 1022  BP: (!) 145/84  BP Location: Right upper arm  Patient Position: Sitting  BP Cuff Size: Adult  Pulse: 68  SpO2: 96%  Weight: 89.1 kg (196 lb 6.4 oz)  Height: 180.3 cm (5' 11)     Body mass index is 27.39 kg/m.  Physical Exam:   Physical Exam Vitals and nursing note reviewed.  Constitutional:      General: in no acute distress.    Appearance: Normal appearance. Is not ill-appearing, toxic-appearing or diaphoretic.  HENT:     Head: Normocephalic and atraumatic.     Right Ear: External ear normal.     Left Ear: External ear normal.  Eyes:     General:        Right eye: No discharge.        Left eye: No discharge.     Extraocular Movements: Extraocular movements intact.     Pupils: Pupils are equal, round, and reactive to light.  Cardiovascular:     Rate and Rhythm: Normal rate and regular rhythm.     Pulses: Normal pulses.     Heart sounds: Normal heart sounds. No murmur heard.    No friction rub. No gallop.  Abdominal:     General: Bowel sounds are normal.  Skin:    General: Skin is warm and dry.     Capillary Refill: Capillary refill takes less  than 2 seconds.  Neurological:     Mental Status: Patient is alert.     Lab and Imaging Results:   Results RADIOLOGY Lumbar spine X-ray: No displacement of orthopedic hardware.    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 No recent COVID-19 infection. Insurance denied injectable medication for lung inflammation, but he is doing better without it. Lungs are clear on examination. - Continue Trelegy inhaler once daily in the morning. - No need for Advair inhaler. - No need for injectable medication for lung inflammation at this time.   Severe Back pain   Patient is grimacing during inteview and examination unable to sit still due to constant sacral pain.  He asks for at least few days of relief with pain medication while he awaits his appt with pain management.     We have placed prescription for very short term narcotic until he sees his pain specialist.   I spent a total of 41 minutes in both face-to-face and non-face-to-face activities, excluding procedures performed, for this visit on the date of this encounter.   This note has been created using dictation software  tool and any typographical errors are purely unintentional.  Patient received an After Visit Summary

## 2024-06-09 ENCOUNTER — Telehealth: Payer: Self-pay | Admitting: Infectious Diseases

## 2024-06-09 DIAGNOSIS — M4726 Other spondylosis with radiculopathy, lumbar region: Secondary | ICD-10-CM | POA: Diagnosis not present

## 2024-06-09 DIAGNOSIS — M545 Low back pain, unspecified: Secondary | ICD-10-CM | POA: Diagnosis not present

## 2024-06-09 DIAGNOSIS — M5416 Radiculopathy, lumbar region: Secondary | ICD-10-CM | POA: Diagnosis not present

## 2024-06-09 DIAGNOSIS — Z981 Arthrodesis status: Secondary | ICD-10-CM | POA: Diagnosis not present

## 2024-06-10 NOTE — Telephone Encounter (Signed)
 error

## 2024-06-13 ENCOUNTER — Other Ambulatory Visit: Payer: Self-pay | Admitting: Student

## 2024-06-13 DIAGNOSIS — M4726 Other spondylosis with radiculopathy, lumbar region: Secondary | ICD-10-CM

## 2024-06-13 DIAGNOSIS — M5416 Radiculopathy, lumbar region: Secondary | ICD-10-CM

## 2024-06-13 DIAGNOSIS — Z981 Arthrodesis status: Secondary | ICD-10-CM

## 2024-06-14 ENCOUNTER — Ambulatory Visit
Admission: RE | Admit: 2024-06-14 | Discharge: 2024-06-14 | Disposition: A | Discharge status: Home / Self Care | Source: Ambulatory Visit | Attending: Student | Admitting: Student

## 2024-06-14 DIAGNOSIS — Z981 Arthrodesis status: Secondary | ICD-10-CM | POA: Insufficient documentation

## 2024-06-14 DIAGNOSIS — M5416 Radiculopathy, lumbar region: Secondary | ICD-10-CM | POA: Insufficient documentation

## 2024-06-14 DIAGNOSIS — M4726 Other spondylosis with radiculopathy, lumbar region: Secondary | ICD-10-CM | POA: Insufficient documentation

## 2024-06-14 DIAGNOSIS — Z043 Encounter for examination and observation following other accident: Secondary | ICD-10-CM | POA: Diagnosis not present

## 2024-06-14 DIAGNOSIS — M48061 Spinal stenosis, lumbar region without neurogenic claudication: Secondary | ICD-10-CM | POA: Diagnosis not present

## 2024-06-14 DIAGNOSIS — M5137 Other intervertebral disc degeneration, lumbosacral region with discogenic back pain only: Secondary | ICD-10-CM | POA: Diagnosis not present

## 2024-06-17 ENCOUNTER — Other Ambulatory Visit: Payer: Self-pay

## 2024-06-17 ENCOUNTER — Encounter (HOSPITAL_COMMUNITY): Payer: Self-pay

## 2024-06-17 ENCOUNTER — Inpatient Hospital Stay (HOSPITAL_COMMUNITY)
Admission: EM | Admit: 2024-06-17 | Discharge: 2024-06-22 | DRG: 540 | Disposition: A | Discharge status: Home Health | Source: Ambulatory Visit | Attending: Family Medicine | Admitting: Family Medicine

## 2024-06-17 DIAGNOSIS — G500 Trigeminal neuralgia: Secondary | ICD-10-CM | POA: Diagnosis present

## 2024-06-17 DIAGNOSIS — Z87442 Personal history of urinary calculi: Secondary | ICD-10-CM | POA: Diagnosis not present

## 2024-06-17 DIAGNOSIS — Z96641 Presence of right artificial hip joint: Secondary | ICD-10-CM | POA: Diagnosis not present

## 2024-06-17 DIAGNOSIS — G4700 Insomnia, unspecified: Secondary | ICD-10-CM | POA: Diagnosis present

## 2024-06-17 DIAGNOSIS — R490 Dysphonia: Secondary | ICD-10-CM | POA: Diagnosis not present

## 2024-06-17 DIAGNOSIS — I10 Essential (primary) hypertension: Secondary | ICD-10-CM | POA: Diagnosis not present

## 2024-06-17 DIAGNOSIS — Z96643 Presence of artificial hip joint, bilateral: Secondary | ICD-10-CM | POA: Diagnosis not present

## 2024-06-17 DIAGNOSIS — Z79899 Other long term (current) drug therapy: Secondary | ICD-10-CM

## 2024-06-17 DIAGNOSIS — T8452XA Infection and inflammatory reaction due to internal left hip prosthesis, initial encounter: Secondary | ICD-10-CM | POA: Diagnosis not present

## 2024-06-17 DIAGNOSIS — M545 Low back pain, unspecified: Secondary | ICD-10-CM | POA: Diagnosis present

## 2024-06-17 DIAGNOSIS — E7800 Pure hypercholesterolemia, unspecified: Secondary | ICD-10-CM | POA: Diagnosis present

## 2024-06-17 DIAGNOSIS — M4645 Discitis, unspecified, thoracolumbar region: Secondary | ICD-10-CM | POA: Diagnosis not present

## 2024-06-17 DIAGNOSIS — D519 Vitamin B12 deficiency anemia, unspecified: Secondary | ICD-10-CM | POA: Diagnosis present

## 2024-06-17 DIAGNOSIS — Z66 Do not resuscitate: Secondary | ICD-10-CM | POA: Diagnosis not present

## 2024-06-17 DIAGNOSIS — Y831 Surgical operation with implant of artificial internal device as the cause of abnormal reaction of the patient, or of later complication, without mention of misadventure at the time of the procedure: Secondary | ICD-10-CM | POA: Diagnosis present

## 2024-06-17 DIAGNOSIS — M5135 Other intervertebral disc degeneration, thoracolumbar region: Secondary | ICD-10-CM | POA: Diagnosis not present

## 2024-06-17 DIAGNOSIS — T8452XS Infection and inflammatory reaction due to internal left hip prosthesis, sequela: Secondary | ICD-10-CM | POA: Diagnosis not present

## 2024-06-17 DIAGNOSIS — M4646 Discitis, unspecified, lumbar region: Secondary | ICD-10-CM | POA: Diagnosis not present

## 2024-06-17 DIAGNOSIS — Z85528 Personal history of other malignant neoplasm of kidney: Secondary | ICD-10-CM | POA: Diagnosis not present

## 2024-06-17 DIAGNOSIS — Z7982 Long term (current) use of aspirin: Secondary | ICD-10-CM

## 2024-06-17 DIAGNOSIS — F32A Depression, unspecified: Secondary | ICD-10-CM | POA: Diagnosis not present

## 2024-06-17 DIAGNOSIS — Z9181 History of falling: Secondary | ICD-10-CM | POA: Diagnosis not present

## 2024-06-17 DIAGNOSIS — Z981 Arthrodesis status: Secondary | ICD-10-CM

## 2024-06-17 DIAGNOSIS — M479 Spondylosis, unspecified: Secondary | ICD-10-CM | POA: Diagnosis present

## 2024-06-17 DIAGNOSIS — Z8673 Personal history of transient ischemic attack (TIA), and cerebral infarction without residual deficits: Secondary | ICD-10-CM

## 2024-06-17 DIAGNOSIS — G629 Polyneuropathy, unspecified: Secondary | ICD-10-CM | POA: Diagnosis not present

## 2024-06-17 DIAGNOSIS — Z96642 Presence of left artificial hip joint: Secondary | ICD-10-CM | POA: Diagnosis not present

## 2024-06-17 DIAGNOSIS — M1612 Unilateral primary osteoarthritis, left hip: Secondary | ICD-10-CM | POA: Diagnosis not present

## 2024-06-17 DIAGNOSIS — Z7951 Long term (current) use of inhaled steroids: Secondary | ICD-10-CM

## 2024-06-17 DIAGNOSIS — Z885 Allergy status to narcotic agent status: Secondary | ICD-10-CM

## 2024-06-17 DIAGNOSIS — Z888 Allergy status to other drugs, medicaments and biological substances status: Secondary | ICD-10-CM

## 2024-06-17 DIAGNOSIS — M4625 Osteomyelitis of vertebra, thoracolumbar region: Principal | ICD-10-CM | POA: Diagnosis present

## 2024-06-17 DIAGNOSIS — M549 Dorsalgia, unspecified: Secondary | ICD-10-CM | POA: Diagnosis not present

## 2024-06-17 DIAGNOSIS — J449 Chronic obstructive pulmonary disease, unspecified: Secondary | ICD-10-CM | POA: Diagnosis present

## 2024-06-17 DIAGNOSIS — R609 Edema, unspecified: Secondary | ICD-10-CM | POA: Diagnosis not present

## 2024-06-17 DIAGNOSIS — M4626 Osteomyelitis of vertebra, lumbar region: Secondary | ICD-10-CM

## 2024-06-17 DIAGNOSIS — Z8614 Personal history of Methicillin resistant Staphylococcus aureus infection: Secondary | ICD-10-CM

## 2024-06-17 DIAGNOSIS — R030 Elevated blood-pressure reading, without diagnosis of hypertension: Secondary | ICD-10-CM

## 2024-06-17 DIAGNOSIS — Z6827 Body mass index (BMI) 27.0-27.9, adult: Secondary | ICD-10-CM | POA: Diagnosis not present

## 2024-06-17 DIAGNOSIS — M48062 Spinal stenosis, lumbar region with neurogenic claudication: Secondary | ICD-10-CM | POA: Diagnosis not present

## 2024-06-17 DIAGNOSIS — K219 Gastro-esophageal reflux disease without esophagitis: Secondary | ICD-10-CM | POA: Diagnosis present

## 2024-06-17 DIAGNOSIS — F419 Anxiety disorder, unspecified: Secondary | ICD-10-CM | POA: Diagnosis not present

## 2024-06-17 LAB — CBC
HCT: 40.3 % (ref 39.0–52.0)
Hemoglobin: 12.7 g/dL — ABNORMAL LOW (ref 13.0–17.0)
MCH: 30.5 pg (ref 26.0–34.0)
MCHC: 31.5 g/dL (ref 30.0–36.0)
MCV: 96.6 fL (ref 80.0–100.0)
Platelets: 284 K/uL (ref 150–400)
RBC: 4.17 MIL/uL — ABNORMAL LOW (ref 4.22–5.81)
RDW: 13.3 % (ref 11.5–15.5)
WBC: 7.8 K/uL (ref 4.0–10.5)
nRBC: 0 % (ref 0.0–0.2)

## 2024-06-17 LAB — COMPREHENSIVE METABOLIC PANEL WITH GFR
ALT: 25 U/L (ref 0–44)
AST: 17 U/L (ref 15–41)
Albumin: 3.7 g/dL (ref 3.5–5.0)
Alkaline Phosphatase: 220 U/L — ABNORMAL HIGH (ref 38–126)
Anion gap: 12 (ref 5–15)
BUN: 18 mg/dL (ref 8–23)
CO2: 28 mmol/L (ref 22–32)
Calcium: 9 mg/dL (ref 8.9–10.3)
Chloride: 100 mmol/L (ref 98–111)
Creatinine, Ser: 1.15 mg/dL (ref 0.61–1.24)
GFR, Estimated: >60 mL/min (ref >60)
Glucose, Bld: 114 mg/dL — ABNORMAL HIGH (ref 70–99)
Potassium: 4.3 mmol/L (ref 3.5–5.1)
Sodium: 140 mmol/L (ref 135–145)
Total Bilirubin: 0.6 mg/dL (ref 0.0–1.2)
Total Protein: 6.7 g/dL (ref 6.5–8.1)

## 2024-06-17 LAB — LACTIC ACID, PLASMA
Lactic Acid, Venous: 0.9 mmol/L (ref 0.5–1.9)
Lactic Acid, Venous: 0.8 mmol/L (ref 0.5–1.9)

## 2024-06-17 LAB — SEDIMENTATION RATE: Sed Rate: 14 mm/h (ref 0–16)

## 2024-06-17 MED ORDER — ONDANSETRON HCL 4 MG/2ML IJ SOLN: 4.0000 mg | Freq: Four times a day (QID) | INTRAMUSCULAR | Status: DC | PRN

## 2024-06-17 MED ORDER — ACETAMINOPHEN 650 MG RE SUPP: 650.0000 mg | Freq: Four times a day (QID) | RECTAL | Status: DC | PRN

## 2024-06-17 MED ORDER — METHOCARBAMOL 500 MG PO TABS
500.0000 mg | ORAL_TABLET | Freq: Four times a day (QID) | ORAL | Status: DC | PRN
  Administered 2024-06-21: 500 mg via ORAL
  Filled 2024-06-17 (×2): qty 1

## 2024-06-17 MED ORDER — ACETAMINOPHEN 325 MG PO TABS: 650.0000 mg | ORAL_TABLET | Freq: Four times a day (QID) | ORAL | Status: DC | PRN

## 2024-06-17 MED ORDER — MAGNESIUM OXIDE -MG SUPPLEMENT 400 (240 MG) MG PO TABS
400.0000 mg | ORAL_TABLET | Freq: Every day | ORAL | Status: DC
  Administered 2024-06-18 – 2024-06-22 (×5): 400 mg via ORAL
  Filled 2024-06-17 (×5): qty 1

## 2024-06-17 MED ORDER — VITAMIN D 25 MCG (1000 UNIT) PO TABS
1000.0000 [IU] | ORAL_TABLET | Freq: Every day | ORAL | Status: DC
  Administered 2024-06-18 – 2024-06-22 (×5): 1000 [IU] via ORAL
  Filled 2024-06-17 (×5): qty 1

## 2024-06-17 MED ORDER — TRIAMCINOLONE ACETONIDE 0.025 % EX CREA
1.0000 | TOPICAL_CREAM | Freq: Every day | CUTANEOUS | Status: DC
  Administered 2024-06-18 – 2024-06-21 (×4): 1 via TOPICAL
  Filled 2024-06-17: qty 15

## 2024-06-17 MED ORDER — GABAPENTIN 100 MG PO CAPS
100.0000 mg | ORAL_CAPSULE | Freq: Every morning | ORAL | Status: DC
  Administered 2024-06-18: 100 mg via ORAL
  Filled 2024-06-17: qty 1

## 2024-06-17 MED ORDER — SENNOSIDES-DOCUSATE SODIUM 8.6-50 MG PO TABS: 1.0000 | ORAL_TABLET | Freq: Every evening | ORAL | Status: DC | PRN

## 2024-06-17 MED ORDER — HYDROMORPHONE HCL 1 MG/ML IJ SOLN: 0.5000 mg | Freq: Four times a day (QID) | INTRAMUSCULAR | Status: DC | PRN

## 2024-06-17 MED ORDER — ONDANSETRON HCL 4 MG/2ML IJ SOLN
4.0000 mg | Freq: Once | INTRAMUSCULAR | Status: AC
Start: 2024-06-17 — End: 2024-06-17
  Administered 2024-06-17: 4 mg via INTRAVENOUS
  Filled 2024-06-17: qty 2

## 2024-06-17 MED ORDER — DULOXETINE HCL 60 MG PO CPEP
60.0000 mg | ORAL_CAPSULE | Freq: Two times a day (BID) | ORAL | Status: DC
  Administered 2024-06-17 – 2024-06-22 (×10): 60 mg via ORAL
  Filled 2024-06-17 (×4): qty 1
  Filled 2024-06-17: qty 2
  Filled 2024-06-17 (×5): qty 1

## 2024-06-17 MED ORDER — ONDANSETRON HCL 4 MG PO TABS: 4.0000 mg | ORAL_TABLET | Freq: Four times a day (QID) | ORAL | Status: DC | PRN

## 2024-06-17 MED ORDER — DIPHENHYDRAMINE-APAP (SLEEP) 25-500 MG PO TABS
2.0000 | ORAL_TABLET | Freq: Every day | ORAL | Status: DC
Start: 2024-06-17 — End: 2024-06-17

## 2024-06-17 MED ORDER — BISACODYL 5 MG PO TBEC: 5.0000 mg | DELAYED_RELEASE_TABLET | Freq: Every day | ORAL | Status: DC | PRN

## 2024-06-17 MED ORDER — ASPIRIN 81 MG PO TBEC
81.0000 mg | DELAYED_RELEASE_TABLET | Freq: Every day | ORAL | Status: DC
Start: 2024-06-18 — End: 2024-06-22
  Administered 2024-06-18 – 2024-06-22 (×5): 81 mg via ORAL
  Filled 2024-06-17 (×5): qty 1

## 2024-06-17 MED ORDER — CARBAMAZEPINE 200 MG PO TABS
400.0000 mg | ORAL_TABLET | Freq: Two times a day (BID) | ORAL | Status: DC
  Administered 2024-06-17 – 2024-06-22 (×10): 400 mg via ORAL
  Filled 2024-06-17 (×11): qty 2

## 2024-06-17 MED ORDER — VITAMIN B-12 1000 MCG PO TABS
1000.0000 ug | ORAL_TABLET | Freq: Every day | ORAL | Status: DC
  Administered 2024-06-18 – 2024-06-22 (×5): 1000 ug via ORAL
  Filled 2024-06-17 (×5): qty 1

## 2024-06-17 MED ORDER — DIPHENHYDRAMINE HCL 25 MG PO CAPS
50.0000 mg | ORAL_CAPSULE | Freq: Every day | ORAL | Status: DC
  Administered 2024-06-18 – 2024-06-21 (×4): 50 mg via ORAL
  Filled 2024-06-17 (×5): qty 2

## 2024-06-17 MED ORDER — PANTOPRAZOLE SODIUM 40 MG PO TBEC
40.0000 mg | DELAYED_RELEASE_TABLET | Freq: Every day | ORAL | Status: DC
  Administered 2024-06-18 – 2024-06-22 (×5): 40 mg via ORAL
  Filled 2024-06-17 (×5): qty 1

## 2024-06-17 MED ORDER — MORPHINE SULFATE (PF) 2 MG/ML IV SOLN
2.0000 mg | Freq: Once | INTRAVENOUS | Status: AC
  Administered 2024-06-17: 2 mg via INTRAVENOUS
  Filled 2024-06-17: qty 1

## 2024-06-17 MED ORDER — TRAMADOL HCL 50 MG PO TABS
50.0000 mg | ORAL_TABLET | ORAL | Status: DC | PRN
  Administered 2024-06-18: 50 mg via ORAL
  Filled 2024-06-17 (×2): qty 1

## 2024-06-17 MED ORDER — GABAPENTIN 300 MG PO CAPS
300.0000 mg | ORAL_CAPSULE | Freq: Every day | ORAL | Status: DC
Start: 2024-06-17 — End: 2024-06-18
  Administered 2024-06-17: 300 mg via ORAL
  Filled 2024-06-17: qty 1

## 2024-06-17 MED ORDER — BUDESON-GLYCOPYRROL-FORMOTEROL 160-9-4.8 MCG/ACT IN AERO
2.0000 | INHALATION_SPRAY | Freq: Two times a day (BID) | RESPIRATORY_TRACT | Status: DC
Start: 2024-06-18 — End: 2024-06-19
  Filled 2024-06-17 (×2): qty 5.9

## 2024-06-17 MED ORDER — MELATONIN 5 MG PO TABS
5.0000 mg | ORAL_TABLET | Freq: Every day | ORAL | Status: DC
  Administered 2024-06-17 – 2024-06-21 (×5): 5 mg via ORAL
  Filled 2024-06-17 (×4): qty 1

## 2024-06-17 NOTE — H&P (Signed)
 History and Physical  Bryan Frey FMW:[id number] DOB: [birth year] DOA: 06/17/2024  PCP: Cleotilde Oneil FALCON, MD   Chief Complaint: Low back pain  HPI: Bryan Frey is a 78 y.o. 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 Patient reports that since his left hip arthroplasty 2 years ago, he has had persistent infection of his left hip prosthetic with intermittent drainage from open wound on his lateral hip. He is followed by ID and has been on doxycycline  intermittently over the last year.  Reports that 5 weeks ago, he fell on his back, was evaluated in the ER and x-rays was negative. He continued to have progressive back pain and he was evaluated by Ortho last week and scheduled for MRI on 9/9. He had follow-up with his neurosurgeon today and due to findings of lumbar discitis/osteomyelitis on the MRI, he was advised to present to the ED for further management. He endorsed some numbness in his feet but denies any fevers, chills, nausea, vomiting, bowel incontinence, urinary incontinence, headache shortness of breath or chest pain.   ED Course: Initial vitals show patient afebrile, slightly hypertensive with SBP in the 120s to 150s. Initial labs significant for normal renal function, alk phos 220, WBC 7.8, Hgb 12.7, lactic acid 0.9-0.8, ESR 14. MRI L-spine on 9/9 shows evidence of discitis and osteomyelitis at the T12-L1 disc space.  Neurosurgery was consulted for evaluation. [facility] was consulted for admission.   Review of Systems: Please see HPI for pertinent positives and negatives. A complete 10 system review of systems are otherwise negative.  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 removed tumor and all is clear since then   Nephrolithiasis    Neuromuscular disorder (HCC)    facial nerve pain   Spinal stenosis of lumbar region    with neurogenic claudication   Stroke (HCC) 2010   heat stroke d/t a hot day and working under a house.no problems after   Trigeminal neuralgia    Past Surgical History:  Procedure Laterality Date   BACK SURGERY     CATARACT EXTRACTION W/PHACO Left 12/24/2017   Procedure: CATARACT EXTRACTION PHACO AND INTRAOCULAR LENS PLACEMENT (IOC);  Surgeon: Myrna Adine Oneil, MD;  Location: [facility];  Service: Ophthalmology;  Laterality: Left;  Lot # [id number] H US : 00:30.4 AP%: 9.4 CDE: 2.84   CATARACT EXTRACTION W/PHACO Right 01/26/2018   Procedure: CATARACT EXTRACTION PHACO AND INTRAOCULAR LENS PLACEMENT (IOC) RIGHT;  Surgeon: Myrna Adine Oneil, MD;  Location: [facility];  Service: Ophthalmology;  Laterality: Right;   COLONOSCOPY     COLONOSCOPY  12/03/2020   Procedure: COLONOSCOPY;  Surgeon: Eldorado, Teodoro  K, MD;  Location: [facility];  Service: Gastroenterology;;   ESOPHAGOGASTRODUODENOSCOPY N/A 03/15/2024   Procedure: EGD (ESOPHAGOGASTRODUODENOSCOPY);  Surgeon: Toledo, Ladell POUR, MD;  Location: [facility];  Service: Gastroenterology;  Laterality: N/A;  Request Early AM Appt.   EYE SURGERY Bilateral    cataract extra tions   INCISION AND DRAINAGE HIP Left 06/06/2022   Procedure: IRRIGATION AND DEBRIDEMENT OF LEFT HIP WITH FEMORAL HEAD EXCHANGE;  Surgeon: Mardee Lynwood SQUIBB, MD;  Location: [facility];   Service: Orthopedics;  Laterality: Left;   IR RADIOLOGIST EVAL & MGMT  03/18/2017   JOINT REPLACEMENT Bilateral    left hip replaced x 2, right hip x 1   Left total hip arthroplasty  08/07/2010   LUMBAR LAMINECTOMY/DECOMPRESSION MICRODISCECTOMY  02/09/2012   Procedure: LUMBAR LAMINECTOMY/DECOMPRESSION MICRODISCECTOMY 2 LEVELS;  Surgeon: Victory Gens, MD;  Location: [facility];  Service: Neurosurgery;  Laterality: Bilateral;  Bilateral Lumbar two-three,lumbar four-five laminectomies   MALONEY DILATION  03/15/2024   Procedure: DILATION, ESOPHAGUS, USING MALONEY DILATOR;  Surgeon: Aundria, Ladell POUR, MD;  Location: [facility];  Service: Gastroenterology;;   NEPHROLITHOTOMY Right 02/09/2014   Procedure: NEPHROLITHOTOMY PERCUTANEOUS;  Surgeon: Garnette Shack, MD;  Location: [facility];  Service: Urology;  Laterality: Right;   Right total hip arthroplasty  07/13/2014   TOTAL HIP REVISION Left 05/14/2015   Procedure: TOTAL HIP REVISION;  Surgeon: Lynwood SQUIBB Mardee, MD;  Location: [facility];  Service: Orthopedics;  Laterality: Left;   TOTAL HIP REVISION Left 04/06/2020   Procedure: TOTAL HIP REVISION;  Surgeon: Mardee Lynwood SQUIBB, MD;  Location: [facility];  Service: Orthopedics;  Laterality: Left;   Social History:  reports that he has never smoked. He has never used smokeless tobacco. He reports that he does not drink alcohol and does not use drugs.  Allergies  Allergen Reactions   Morphine  And Codeine Other (See Comments)    Hallucinations   Statins Other (See Comments)    Other reaction(s): Muscle Pain     Family History  Problem Relation Age of Onset   Anesthesia problems Neg Hx      Prior to Admission medications   Medication Sig Start Date End Date Taking? Authorizing Provider  aspirin  EC 81 MG tablet Take 81 mg by mouth daily. Swallow whole.   Yes [provider]  carbamazepine  (TEGRETOL ) 200 MG tablet Take 400 mg by mouth in the morning and at bedtime.   Yes [provider]   cholecalciferol  (VITAMIN D3) 25 MCG (1000 UNIT) tablet Take 1,000 Units by mouth daily.   Yes [provider]  Cholecalciferol  1.25 MG (50000 UT) capsule Take 50,000 Units by mouth once a week. Thursday 04/05/24 04/05/25 Yes [provider]  diphenhydramine -acetaminophen  (TYLENOL  PM) 25-500 MG TABS Take 2 tablets by mouth at bedtime.   Yes [provider]  doxycycline  (VIBRA -TABS) 100 MG tablet Take 1 tablet (100 mg total) by mouth 2 (two) times daily. Patient taking differently: Take 100 mg by mouth daily. 11/10/23  Yes Fayette Bodily, MD  DULoxetine  (CYMBALTA ) 60 MG capsule Take 60 mg by mouth 2 (two) times daily.   Yes [provider]  gabapentin  (NEURONTIN ) 100 MG capsule Take 100 mg by mouth in the morning. 04/04/24 04/04/25 Yes [provider]  gabapentin  (NEURONTIN ) 300 MG capsule Take 300 mg by mouth at bedtime.   Yes [provider]  MAGNESIUM  PO Take 1 capsule by mouth daily at 2 PM.   Yes [provider]  Melatonin 5 MG CAPS Take  5 mg by mouth at bedtime.   Yes [provider]  methocarbamol  (ROBAXIN ) 500 MG tablet Take 500 mg by mouth every 6 (six) hours as needed for muscle spasms. 06/09/24 06/19/24 Yes [provider]  pantoprazole  (PROTONIX ) 40 MG tablet Take 40 mg by mouth daily. 10/22/23 10/21/24 Yes [provider]  traMADol  (ULTRAM ) 50 MG tablet Take 1 tablet (50 mg total) by mouth every 4 (four) hours as needed for moderate pain. 06/07/22  Yes Charlene Debby BROCKS, PA-C  TRELEGY ELLIPTA 200-62.5-25 MCG/ACT AEPB Inhale 1 puff into the lungs daily.   Yes [provider]  triamcinolone  (KENALOG ) 0.025 % cream Apply 1 Application topically at bedtime. On irritated areas on back 12/15/23  Yes [provider]  vitamin B-12 (CYANOCOBALAMIN ) 1000 MCG tablet Take 1,000 mcg by mouth daily.   Yes [provider]    Physical Exam: BP (!) 147/76 (BP Location: Right Arm)   Pulse 70    Temp 97.7 F (36.5 C) (Oral)   Resp 12   Ht 5' 11 (1.803 m)   Wt 88.9 kg   SpO2 100%   BMI 27.34 kg/m  General: Pleasant, well-appearing elderly man laying in bed. No acute distress. HEENT: /AT. Anicteric sclera CV: RRR. No murmurs, rubs, or gallops. No LE edema Pulmonary: Lungs CTAB. Normal effort. No wheezing or rales. Abdominal: Soft, nontender, nondistended. Normal bowel sounds. MSK: Mild tenderness to palpation of the L-spine. Left hip surgical site wound covered with dressing, no tenderness. Skin: Warm and dry. No obvious rash or lesions. Neuro: A&Ox3. Moves all extremities. Normal sensation to light touch. No focal deficit. Psych: Normal mood and affect          Labs on Admission:  Basic Metabolic Panel: Recent Labs  Lab 06/17/24 1704  NA 140  K 4.3  CL 100  CO2 28  GLUCOSE 114*  BUN 18  CREATININE 1.15  CALCIUM  9.0   Liver Function Tests: Recent Labs  Lab 06/17/24 1704  AST 17  ALT 25  ALKPHOS 220*  BILITOT 0.6  PROT 6.7  ALBUMIN  3.7   No results for input(s): LIPASE, AMYLASE in the last 168 hours. No results for input(s): AMMONIA in the last 168 hours. CBC: Recent Labs  Lab 06/17/24 1704  WBC 7.8  HGB 12.7*  HCT 40.3  MCV 96.6  PLT 284   Cardiac Enzymes: No results for input(s): CKTOTAL, CKMB, CKMBINDEX, TROPONINI in the last 168 hours. BNP (last 3 results) No results for input(s): BNP in the last 8760 hours.  ProBNP (last 3 results) No results for input(s): PROBNP in the last 8760 hours.  CBG: No results for input(s): GLUCAP in the last 168 hours.  Radiological Exams on Admission: No results found. Assessment/Plan Bryan Frey is a 78 y.o. 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    # Lumbar discitis/osteomyelitis - Pt with history of lumbar fusion presented with worsening low back pain - MRI L-spine on 9/9 shows evidence of discitis and osteomyelitis at the T12-L1 disc space likely seeding from his chronic left hip prosthetic infection - Patient hemodynamically stable, afebrile, normal ESR with no white count - Neurosurgery following, recommends IR aspiration of disc space for culture prior to initiating antibiotics - IR consulted,  appreciate assistance - Will need ID consult in the morning - Continue home PRN tramadol  and Robaxin   - Start PRN IV Dilaudid  for severe, breakthrough pain - Trend CBC, fever curve and follow-up CRP  # S/p left hip hemiarthroplasty # Chronic left hip prosthetic infection - Reports history of long-term antibiotics with doxycycline  due to intermittent drainage from surgical site - Denies any drainage or hip pain at the moment - Currently pending revision total hip arthroplasty on 10/14/2024 at [facility] - Would likely benefit from an earlier revision - Pending ID consult  # Trigeminal neuralgia - Continue carbamazepine   # COPD - Chronic and stable - Continue home bronchodilators  # Peripheral neuropathy - Continue gabapentin   # Anxiety and depression - Continue duloxetine   # GERD - Continue Protonix   # Insomnia - Continue melatonin and Benadryl   DVT prophylaxis: SCDs    Code Status: Limited: Do not attempt resuscitation (DNR) -DNR-LIMITED -Do Not Intubate/DNI   Consults called: Neurosurgery, IR  Family Communication: No family at bedside  Severity of Illness: The appropriate patient status for this patient is INPATIENT. Inpatient status is judged to be reasonable and necessary in order to provide the required intensity of service to ensure the patient's safety. The patient's presenting symptoms, physical exam findings, and initial radiographic and  laboratory data in the context of their chronic comorbidities is felt to place them at high risk for further clinical deterioration. Furthermore, it is not anticipated that the patient will be medically stable for discharge from the hospital within 2 midnights of admission.   * I certify that at the point of admission it is my clinical judgment that the patient will require inpatient hospital care spanning beyond 2 midnights from the point of admission due to high intensity of service, high risk for further deterioration and high frequency of surveillance required.*  Level of care: Med-Surg   This record has been created using Conservation officer, historic buildings. Errors have been sought and corrected, but may not always be located. Such creation errors do not reflect on the standard of care.   Lou Claretta HERO, MD 06/17/2024, 9:49 PM [facility] Pager: [phone] Isaiah 41:10   If 7PM-7AM, please contact night-coverage [email or web address] Password [email or web address]

## 2024-06-17 NOTE — ED Provider Notes (Signed)
 [place] EMERGENCY DEPARTMENT AT [facility] Provider Note   CSN: [id number] Arrival date & time: 06/17/24  1423     Patient presents with: Back Pain   Bryan Frey is a 78 y.o. male.   Pt with hx DDD, remote hx surgery for same, c/o worsening low back pain in past 6-7 weeks. Indicates dull pain lower back that occasional radiates to bil legs. Denies saddle area or leg numbness. Denies new leg weakness. 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 Denies new hip pain or new hip drainage or redness. No fever or chills. Had outpatient MRI this past week concerning for lumbar disciitis/osteo, was seen by neurosurgery today and sent to ED for admission to medicine.   The history is provided by the patient, the spouse and medical records.  Back Pain Associated symptoms: no abdominal pain, no chest pain, no fever, no headaches and no numbness        Prior to Admission medications   Medication Sig Start Date End Date Taking? Authorizing Provider  acetaminophen  (TYLENOL ) 500 MG tablet Take 1,000 mg by mouth daily.    [provider]  amLODipine  (NORVASC ) 10 MG tablet Take 10 mg by mouth daily.    [provider]  bacitracin  500 UNIT/GM ointment Apply 1 Application topically 2 (two) times daily. 09/04/22   Fayette Bodily, MD  carbamazepine  (TEGRETOL ) 200 MG tablet Take 400 mg by mouth 3 (three) times daily.     [provider]  diphenhydramine -acetaminophen  (TYLENOL  PM) 25-500 MG TABS Take 2 tablets by mouth at bedtime.    [provider]  doxycycline  (VIBRA -TABS) 100 MG tablet Take 1 tablet (100 mg total) by mouth 2 (two) times daily. 11/10/23   Fayette Bodily, MD  DULoxetine   (CYMBALTA ) 60 MG capsule Take 60 mg by mouth 2 (two) times daily.    [provider]  hydrochlorothiazide  (HYDRODIURIL ) 25 MG tablet Take 25 mg by mouth daily.    [provider]  ketoconazole (NIZORAL) 2 % cream Apply 1 Application topically daily. 06/25/23   [provider]  Melatonin 5 MG CAPS Take 5 mg by mouth at bedtime.    [provider]  pantoprazole  (PROTONIX ) 40 MG tablet Take 40 mg by mouth daily. 10/22/23 10/21/24  [provider]  predniSONE  (STERAPRED UNI-PAK 21 TAB) 10 MG (21) TBPK tablet Take 6 tablets on the first day and decrease by 1 tablet each day until finished. 05/17/24   Triplett, Kirk B, FNP  tamsulosin  (FLOMAX ) 0.4 MG CAPS capsule Take 1 capsule (0.4 mg total) by mouth daily. Patient not taking: Reported on 03/15/2024 04/09/20   Claudene Morene NOVAK, PA-C  traMADol  (ULTRAM ) 50 MG tablet Take 1 tablet (50 mg total) by mouth every 4 (four) hours as needed for moderate pain. 06/07/22   Charlene Debby BROCKS, PA-C  triamcinolone  (KENALOG ) 0.025 % cream SMARTSIG:Topical Twice Daily 12/15/23   [provider]  vitamin B-12 (CYANOCOBALAMIN ) 1000 MCG tablet Take 1,000 mcg by mouth daily.    [provider]    Allergies: Morphine  and codeine and Statins    Review of Systems  Constitutional:  Negative for chills and fever.  HENT:  Negative for sore throat.   Respiratory:  Negative for cough and shortness of breath.   Cardiovascular:  Negative for chest pain.  Gastrointestinal:  Negative for abdominal pain, nausea and vomiting.  Genitourinary:  Negative for flank pain.  Musculoskeletal:  Positive for back pain. Negative for neck pain.  Skin:  Negative for rash.  Neurological:  Negative for numbness and headaches.    Updated Vital Signs BP (!) 147/76 (BP Location: Right Arm)   Pulse 70   Temp 97.7 F (36.5 C) (Oral)   Resp 12   Ht 1.803 m (5' 11)   Wt 88.9 kg   SpO2 100%   BMI 27.34 kg/m   Physical Exam Vitals and  nursing note reviewed.  Constitutional:      Appearance: Normal appearance. He is well-developed.  HENT:     Head: Atraumatic.     Nose: Nose normal.     Mouth/Throat:     Mouth: Mucous membranes are moist.     Pharynx: Oropharynx is clear.  Eyes:     General: No scleral icterus.    Conjunctiva/sclera: Conjunctivae normal.  Neck:     Trachea: No tracheal deviation.  Cardiovascular:     Rate and Rhythm: Normal rate and regular rhythm.     Pulses: Normal pulses.     Heart sounds: Normal heart sounds. No murmur heard.    No friction rub. No gallop.  Pulmonary:     Effort: Pulmonary effort is normal. No accessory muscle usage or respiratory distress.     Breath sounds: Normal breath sounds.  Abdominal:     General: Bowel sounds are normal. There is no distension.     Palpations: Abdomen is soft.     Tenderness: There is no abdominal tenderness. There is no guarding.  Genitourinary:    Comments: No cva tenderness. Musculoskeletal:        General: No swelling or tenderness.     Cervical back: Normal range of motion and neck supple. No rigidity.     Comments: Good passive rom left hip without pain. Upper/mid lumbar area tenderness. No skin changes or sts, or redness.   Skin:    General: Skin is warm and dry.     Findings: No rash.  Neurological:     Mental Status: He is alert.     Comments: Alert, speech clear. Motor/sens grossly intact bil lower extremities, stre 5/5. Sens grossly intact.   Psychiatric:        Mood and Affect: Mood normal.     (all labs ordered are listed, but only abnormal results are displayed) Results for orders placed or performed during the hospital encounter of 06/17/24  Lactic acid, plasma   Collection Time: 06/17/24  4:48 PM  Result Value Ref Range   Lactic Acid, Venous 0.9 0.5 - 1.9 mmol/L  CBC   Collection Time: 06/17/24  5:04 PM  Result Value Ref Range   WBC 7.8 4.0 - 10.5 K/uL   RBC 4.17 (L) 4.22 - 5.81 MIL/uL   Hemoglobin 12.7 (L) 13.0 -  17.0 g/dL   HCT 59.6 60.9 - 47.9 %   MCV 96.6 80.0 - 100.0 fL   MCH 30.5 26.0 - 34.0 pg   MCHC 31.5 30.0 - 36.0 g/dL   RDW 86.6 88.4 - 84.4 %   Platelets 284 150 - 400 K/uL   nRBC 0.0 0.0 - 0.2 %  Comprehensive metabolic panel with GFR   Collection Time: 06/17/24  5:04 PM  Result Value Ref Range   Sodium 140 135 - 145  mmol/L   Potassium 4.3 3.5 - 5.1 mmol/L   Chloride 100 98 - 111 mmol/L   CO2 28 22 - 32 mmol/L   Glucose, Bld 114 (H) 70 - 99 mg/dL   BUN 18 8 - 23 mg/dL   Creatinine, Ser 8.84 0.61 - 1.24 mg/dL   Calcium  9.0 8.9 - 10.3 mg/dL   Total Protein 6.7 6.5 - 8.1 g/dL   Albumin  3.7 3.5 - 5.0 g/dL   AST 17 15 - 41 U/L   ALT 25 0 - 44 U/L   Alkaline Phosphatase 220 (H) 38 - 126 U/L   Total Bilirubin 0.6 0.0 - 1.2 mg/dL   GFR, Estimated >39 >39 mL/min   Anion gap 12 5 - 15   MR LUMBAR SPINE WO CONTRAST Result Date: 06/17/2024 EXAM: MRI LUMBAR SPINE 06/14/2024 06:56:12 PM TECHNIQUE: Multiplanar multisequence MRI of the lumbar spine was performed without the administration of intravenous contrast. COMPARISON: MRI lumbar spine 06/05/2020. CLINICAL HISTORY: Pt states fall 1 mo ago, lower back hit door frame. Pt having lower back pain radiating into bilateral legs. FINDINGS: BONES AND ALIGNMENT: Extensive marrow edema is present along the endplates at T12-L1. Solid fusion is present at L2-3, L3-4 and L4-5. SPINAL CORD: The conus terminates normally. SOFT TISSUES: Diffuse paraspinous soft tissue edema is present at T12-L1 with extension posteriorly, surrounding the posterior elements. Benign fluid collection is present posterior to the epidural space at the operative sites of L2-3, L3-4 and L4-5. L1-L2: No significant disc herniation. No spinal canal stenosis or neural foraminal narrowing. L2-L3: Mild residual foraminal narrowing at L2-3 is stable. L3-L4: No significant disc herniation. No spinal canal stenosis or neural foraminal narrowing. L4-L5: No significant disc herniation. No spinal  canal stenosis or neural foraminal narrowing. L5-S1: Mild disc bulging is present at L5 to S1. Moderate facet hypertrophy contributes to moderate foraminal stenosis bilaterally, similar to the prior study. T12-L1: Fluid is present in the disc space at T12-L1. There is some encroachment on the central canal without significant stenosis. IMPRESSION: 1. Fluid in the disc space at T12-L1 with extensive marrow edema along the endplates and diffuse paraspinous soft tissue, causing some encroachment on the central canal without significant stenosis. Findings are highly suggestive of discitis and osteomyelitis. 2. Solid fusion at L2-3, L3-4, and L4-5 with benign third collection posterior to the epidural space at the operative sites. Mild residual foraminal narrowing at L2-3 is stable. 3. Mild disc bulging at L5-S1 with moderate facet hypertrophy contributing to moderate foraminal stenosis bilaterally, similar to the prior study. Findings were discussed with Dr. Colon at 12:45 pm 06/17/2024 Electronically signed by: Lonni Necessary MD 06/17/2024 01:02 PM EDT RP Workstation: [id number]     EKG: None  Radiology: No results found.   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 Labs: ordered. Decision-making details documented in ED Course. Radiology: independent interpretation performed. Decision-making details documented in ED Course. Discussion of management or test interpretation with external provider(s): IR, medicine.   Risk Prescription drug management. Decision regarding hospitalization.   Iv ns. Continuous pulse ox and cardiac monitoring. Labs ordered/sent. Imaging ordered.   Differential diagnosis includes osteomyelitis/discitis ?possible seeding from left hip infection, bacteremia, etc. Dispo decision including potential need for admission considered - will get labs and imaging and reassess.   Reviewed nursing notes and prior charts for additional history. External reports reviewed. Additional history from: spouse.   Cultures sent. IR consulted. NS consulted - discussed w Dr Colon who has already seen today and indicates medicine admission.   Cardiac monitor: sinus rhythm, rate 77.  Labs reviewed/interpreted by me - wbc and hct normal.   Recent MRI  reviewed/interpreted by me - lumbar discitis  Medicine consulted for admission.   Iv abx ordered.   CRITICAL CARE RE: lumbar discitis/osteomyelitits.  Performed by: Raahim Shartzer E Aashritha Miedema Total critical care time: 45 minutes Critical care time was exclusive of separately billable procedures and treating other patients. Critical care was necessary to treat or prevent imminent or life-threatening deterioration. Critical care was time spent personally by me on the following activities: development of treatment plan with patient and/or surrogate as well as nursing, discussions with consultants,  evaluation of patient's response to treatment, examination of patient, obtaining history from patient or surrogate, ordering and performing treatments and interventions, ordering and review of laboratory studies, ordering and review of radiographic studies, pulse oximetry and re-evaluation of patient's condition.  IR consulted - discussed with Dr Polly - he indicates they can have someone eval for possible aspiration tomorrow AM - he indicates for admitting medicine team to place consults for IR and ID.    Discussed with medicine - will admit.        Final diagnoses:  None    ED Discharge Orders     None          Bernard Drivers, MD 06/17/24 2028

## 2024-06-17 NOTE — ED Triage Notes (Signed)
 Pt here sent from MD office for and infection in his back from a fall 1 month ago , mri results in epic

## 2024-06-17 NOTE — ED Notes (Signed)
Pt on cardiac monitoring.

## 2024-06-18 DIAGNOSIS — M4645 Discitis, unspecified, thoracolumbar region: Secondary | ICD-10-CM | POA: Diagnosis not present

## 2024-06-18 DIAGNOSIS — M4646 Discitis, unspecified, lumbar region: Secondary | ICD-10-CM | POA: Diagnosis not present

## 2024-06-18 DIAGNOSIS — T8452XS Infection and inflammatory reaction due to internal left hip prosthesis, sequela: Secondary | ICD-10-CM | POA: Diagnosis not present

## 2024-06-18 LAB — PROTIME-INR
INR: 1.1 (ref 0.8–1.2)
Prothrombin Time: 14.8 s (ref 11.4–15.2)

## 2024-06-18 LAB — C-REACTIVE PROTEIN: CRP: 2.1 mg/dL — ABNORMAL HIGH (ref <1.0)

## 2024-06-18 LAB — CBC
HCT: 36.1 % — ABNORMAL LOW (ref 39.0–52.0)
Hemoglobin: 12 g/dL — ABNORMAL LOW (ref 13.0–17.0)
MCH: 31.5 pg (ref 26.0–34.0)
MCHC: 33.2 g/dL (ref 30.0–36.0)
MCV: 94.8 fL (ref 80.0–100.0)
Platelets: 268 K/uL (ref 150–400)
RBC: 3.81 MIL/uL — ABNORMAL LOW (ref 4.22–5.81)
RDW: 13.2 % (ref 11.5–15.5)
WBC: 8 K/uL (ref 4.0–10.5)
nRBC: 0 % (ref 0.0–0.2)

## 2024-06-18 LAB — BASIC METABOLIC PANEL WITH GFR
Anion gap: 10 (ref 5–15)
BUN: 16 mg/dL (ref 8–23)
CO2: 25 mmol/L (ref 22–32)
Calcium: 8.8 mg/dL — ABNORMAL LOW (ref 8.9–10.3)
Chloride: 103 mmol/L (ref 98–111)
Creatinine, Ser: 1.07 mg/dL (ref 0.61–1.24)
GFR, Estimated: >60 mL/min (ref >60)
Glucose, Bld: 97 mg/dL (ref 70–99)
Potassium: 4.2 mmol/L (ref 3.5–5.1)
Sodium: 138 mmol/L (ref 135–145)

## 2024-06-18 MED ORDER — HYDROMORPHONE HCL 1 MG/ML IJ SOLN: 0.5000 mg | INTRAMUSCULAR | Status: DC | PRN

## 2024-06-18 MED ORDER — GABAPENTIN 300 MG PO CAPS
300.0000 mg | ORAL_CAPSULE | Freq: Three times a day (TID) | ORAL | Status: DC
  Administered 2024-06-18 – 2024-06-22 (×11): 300 mg via ORAL
  Filled 2024-06-18 (×11): qty 1

## 2024-06-18 NOTE — Consult Note (Signed)
 [facility]   Reason for Consult: discitis   Referring Physician: swayze  Principal Problem:   Lumbar discitis Active Problems:   Osteomyelitis of lumbar vertebra (HCC)    HPI: Bryan Frey is a 78 y.o. 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 Last saw dr fayette in march 2025. The patient underwent a fall landing on his back roughly 5 wk ago and since then having excrutiating pain, radiating down legs, with weakness occasionally, due to legs giving out. He ws seen by dr colon on 9/12 in his clinic due to [facility] on 9/9 showing discitis/osteomyelitis, fluid in disc space at T12-L1.  Elsner recommended patient to be admitted for management. Patient is denies numbness to groin/saddle anesthesia, denies fever/chills, mainly debilitated by pain. IR consulted for aspiration. Labs show WBC is wnl. Sed rate 14.   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 removed tumor and all is clear since then   Nephrolithiasis    Neuromuscular disorder (HCC)    facial nerve pain   Spinal stenosis of lumbar region    with neurogenic claudication   Stroke (HCC) 2010   heat stroke d/t a hot day and working under a house.no problems after   Trigeminal neuralgia     Allergies:  Allergies  Allergen Reactions   Morphine  And Codeine Other (See Comments)    Hallucinations   Statins Other (See Comments)    Other reaction(s): Muscle Pain     Current antibiotics:   MEDICATIONS:  aspirin  EC  81 mg Oral Daily   budesonide -glycopyrrolate -formoterol   2 puff Inhalation BID   carbamazepine   400 mg Oral BID   cholecalciferol   1,000 Units Oral Daily   cyanocobalamin   1,000 mcg Oral Daily   diphenhydrAMINE   50 mg Oral QHS   DULoxetine   60 mg Oral BID   gabapentin   300 mg Oral TID   magnesium  oxide  400 mg Oral Q1400   melatonin  5 mg Oral QHS   pantoprazole   40 mg Oral Daily   triamcinolone   1 Application Topical QHS    Social History   Tobacco Use   Smoking status: Never   Smokeless tobacco: Never  Vaping  Use   Vaping status: Never Used  Substance Use Topics   Alcohol use: No   Drug use: No    Types: Other-see comments    Comment: tramadol     Family History  Problem Relation Age of Onset   Anesthesia problems Neg Hx     Review of Systems - 12 point ros is negative except what is mentioned above   OBJECTIVE: Temp:  [97.6 F (36.4 C)-98.1 F (36.7 C)] 98.1 F (36.7 C) (09/13 1614) Pulse Rate:  [66-85] 66 (09/13 1614) Resp:  [12-21] 17 (09/13 0050) BP: (111-156)/(61-85) 138/63 (09/13 1614) SpO2:  [95 %-100 %] 98 % (09/13 1614) Weight:  [88.9 kg] 88.9 kg (09/12 1651) Physical Exam  Constitutional: He is oriented to person, place, and time. He appears well-developed and well-nourished. No distress.  HENT:  Mouth/Throat: Oropharynx is clear and moist. No oropharyngeal  exudate.  Cardiovascular: Normal rate, regular rhythm and normal heart sounds. Exam reveals no gallop and no friction rub.  No murmur heard.  Pulmonary/Chest: Effort normal and breath sounds normal. No respiratory distress. He has no wheezes.  Abdominal: Soft. Bowel sounds are normal. He exhibits no distension. There is no tenderness.  Lymphadenopathy:  He has no cervical adenopathy.  Neurological: He is alert and oriented to person, place, and time.  Skin: Skin is warm and dry. No rash noted. No erythema.  Psychiatric: He has a normal mood and affect. His behavior is normal.     Media Information    LABS: Results for orders placed or performed during the hospital encounter of 06/17/24 (from the past 48 hours)  Lactic acid, plasma     Status: None   Collection Time: 06/17/24  4:48 PM  Result Value Ref Range   Lactic Acid, Venous 0.9 0.5 - 1.9 mmol/L    Comment: Performed at [facility], [address] [place] [address] [place], [place] [address]  CBC     Status: Abnormal   Collection Time: 06/17/24  5:04 PM  Result Value Ref Range   WBC 7.8 4.0 - 10.5 K/uL   RBC 4.17 (L) 4.22 - 5.81 MIL/uL   Hemoglobin 12.7 (L) 13.0 - 17.0 g/dL   HCT 59.6 60.9 - 47.9 %   MCV 96.6 80.0 - 100.0 fL   MCH 30.5 26.0 - 34.0 pg   MCHC 31.5 30.0 - 36.0 g/dL   RDW 86.6 88.4 - 84.4 %   Platelets 284 150 - 400 K/uL   nRBC 0.0 0.0 - 0.2 %    Comment: Performed at [facility], [address], [place], [place] [address]  Comprehensive metabolic panel with GFR     Status: Abnormal   Collection Time: 06/17/24  5:04 PM  Result Value Ref Range   Sodium 140 135 - 145 mmol/L   Potassium 4.3 3.5 - 5.1 mmol/L   Chloride 100 98 - 111 mmol/L   CO2 28 22 - 32 mmol/L   Glucose, Bld 114 (H) 70 - 99 mg/dL    Comment: Glucose reference range applies only to samples taken after fasting for at least 8 hours.   BUN 18 8 - 23 mg/dL   Creatinine, Ser 8.84 0.61 - 1.24 mg/dL   Calcium  9.0 8.9 - 10.3 mg/dL   Total Protein  6.7 6.5 - 8.1 g/dL   Albumin  3.7 3.5 - 5.0 g/dL   AST 17 15 - 41 U/L   ALT 25 0 - 44 U/L   Alkaline Phosphatase 220 (H) 38 - 126  U/L   Total Bilirubin 0.6 0.0 - 1.2 mg/dL   GFR, Estimated >39 >39 mL/min    Comment: (NOTE) Calculated using the CKD-EPI Creatinine Equation (2021)    Anion gap 12 5 - 15    Comment: Performed at [facility], [address], [place], [place] [address]  Sedimentation rate     Status: None   Collection Time: 06/17/24  5:04 PM  Result Value Ref Range   Sed Rate 14 0 - 16 mm/hr    Comment: Performed at [facility], [address], [place], [place] [address]  Blood culture (routine x 2)     Status: None (Preliminary result)   Collection Time: 06/17/24  5:04 PM   Specimen: BLOOD  Result Value Ref Range   Specimen Description BLOOD LEFT ANTECUBITAL    Special Requests      BOTTLES DRAWN AEROBIC AND ANAEROBIC Blood Culture adequate volume   Culture      NO GROWTH < 24 HOURS Performed at [facility], [address] [place], [place] [address]    Report Status PENDING   Blood culture (routine x 2)     Status: None (Preliminary result)   Collection Time: 06/17/24  5:08 PM   Specimen: BLOOD LEFT FOREARM  Result Value Ref Range   Specimen Description BLOOD LEFT FOREARM    Special Requests      BOTTLES DRAWN AEROBIC AND ANAEROBIC Blood Culture results may not be optimal due to an inadequate volume of blood received in culture bottles   Culture      NO GROWTH < 24 HOURS Performed at [facility], [address] [place], [place] [address]    Report Status PENDING   Lactic acid, plasma     Status: None   Collection Time: 06/17/24  7:13 PM  Result Value Ref Range   Lactic Acid, Venous 0.8 0.5 - 1.9 mmol/L    Comment: Performed at [facility], [address], [place], [place] [address]  C-reactive protein     Status: Abnormal   Collection Time: 06/18/24  3:54 AM  Result Value Ref Range   CRP 2.1 (H) <1.0 mg/dL    Comment:  Performed at [facility], [address] [place], [place] [address]  Basic metabolic panel     Status: Abnormal   Collection Time: 06/18/24  3:54 AM  Result Value Ref Range   Sodium 138 135 - 145 mmol/L   Potassium 4.2 3.5 - 5.1 mmol/L   Chloride 103 98 - 111 mmol/L   CO2 25 22 - 32 mmol/L   Glucose, Bld 97 70 - 99 mg/dL    Comment: Glucose reference range applies only to samples taken after fasting for at least 8 hours.   BUN 16 8 - 23 mg/dL   Creatinine, Ser 8.92 0.61 - 1.24 mg/dL   Calcium  8.8 (L) 8.9 - 10.3 mg/dL   GFR, Estimated >39 >39 mL/min    Comment: (NOTE) Calculated using the CKD-EPI Creatinine Equation (2021)    Anion gap 10 5 - 15    Comment: Performed at [facility], [address], [place], [place] [address]  CBC     Status: Abnormal   Collection Time: 06/18/24  3:54 AM  Result Value Ref Range   WBC 8.0 4.0 - 10.5 K/uL   RBC 3.81 (L) 4.22 - 5.81 MIL/uL   Hemoglobin 12.0 (L) 13.0 - 17.0 g/dL   HCT  36.1 (L) 39.0 - 52.0 %   MCV 94.8 80.0 - 100.0 fL   MCH 31.5 26.0 - 34.0 pg   MCHC 33.2 30.0 - 36.0 g/dL   RDW 86.7 88.4 - 84.4 %   Platelets 268 150 - 400 K/uL   nRBC 0.0 0.0 - 0.2 %    Comment: Performed at [facility], [address], [place], [place] [address]  Protime-INR     Status: None   Collection Time: 06/18/24  1:02 PM  Result Value Ref Range   Prothrombin Time 14.8 11.4 - 15.2 seconds   INR 1.1 0.8 - 1.2    Comment: (NOTE) INR goal varies based on device and disease states. Performed at [facility], [address], [place], [place] [address]     MICRO: Blood cx ngtd IMAGING: Lumbar mri IMPRESSION: 1. Fluid in the disc space at T12-L1 with extensive marrow edema along the endplates and diffuse paraspinous soft tissue, causing some encroachment on the central canal without significant stenosis. Findings are highly suggestive of discitis and osteomyelitis. 2. Solid fusion at L2-3, L3-4, and L4-5 with benign third  collection posterior to the epidural space at the operative sites. Mild residual foraminal narrowing at L2-3 is stable. 3. Mild disc bulging at L5-S1 with moderate facet hypertrophy contributing to moderate foraminal stenosis bilaterally, similar to the prior study. Findings were discussed with Dr. Colon at 12:45 pm 06/17/2024 HISTORICAL MICRO/IMAGING jan 2025 Organism ID, Bacteria METHICILLIN RESISTANT STAPHYLOCOCCUS AUREUS  Resulting Agency [facility]     Susceptibility   Methicillin resistant staphylococcus aureus    MIC    CIPROFLOXACIN  >=8 RESISTANT Resistant    CLINDAMYCIN >=8 RESISTANT Resistant    ERYTHROMYCIN >=8 RESISTANT Resistant    GENTAMICIN  <=0.5 SENSI... Sensitive    Inducible Clindamycin NEGATIVE Sensitive    LINEZOLID 2 SENSITIVE Sensitive    OXACILLIN >=4 RESISTANT Resistant    RIFAMPIN  <=0.5 SENSI... Sensitive    TETRACYCLINE <=1 SENSITIVE Sensitive    TRIMETH /SULFA  >=320 RESIS... Resistant    VANCOMYCIN  1 SENSITIVE Sensitive       Assessment/Plan:  78yo M with history of lumbar fusion, and hx of left hip pji intermittently on doxycycline  who is admitted for worsening back pain subsequent fall roughly 5 wks ago now found to have T12-L1 discitis.  - hold on abtx until disc aspirate on 9/15 - then start daptomycin  and ceftriaxone empirically on 9/15 after IR procedure - concern that it may stem from his chronic left hip pji. Recommend repeat mri of left hip to see if this would also move up his intended surgery date for his 2 staged revision - long term medication = will check baseline ck - will need picc line - back pain = defer to primary team for management  evaluation of this patient requires complex antimicrobial therapy evaluation and counseling and isolation needs for disease transmission risk assessment and mitigation.   I have personally spent 75 minutes involved in face-to-face and non-face-to-face activities for this patient on the day of the  visit. Professional time spent includes the following activities: Preparing to see the patient (review of tests), Obtaining and/or reviewing separately obtained history (admission/discharge record), Performing a medically appropriate examination and/or evaluation , Ordering medications/tests/procedures, referring and communicating with other health care professionals, Documenting clinical information in the EMR, Independently interpreting results (not separately reported), Communicating results to the patient/family/caregiver, Counseling and educating the patient/family/caregiver and Care coordination (not separately reported).

## 2024-06-18 NOTE — Progress Notes (Addendum)
 Progress Note   Patient: Bryan Frey FMW:[id number] DOB: [birth year] DOA: 06/17/2024     1 DOS: the patient was seen and examined on 06/18/2024   Brief hospital course: The patient is a 78 yr old man who presented to [facility] ED as sent by neurosurgery for management of his L-spine discitis/osteomyelitis. The patient carried a medical history significant for HTN, trigeminal neuralgia on carbamazepine , left hip hemiarthroplasty x 2 due to chronic infection of left hip prosthetics on and off long term antibiotics with Doxycycline , andxiety and depression, COPD, HLD, and lumbar fusion. Initial hip prosthetics was 2 years ago. Since then he has had persistent infection of his left hip prostheci with intermittent drainage from open wound on his lateral hip. 5 weeks ago the patient suffered a fall onto his back. The patient developed progressive back pain. He underwent an MRI on 06/14/2024 which demonstrated lumbar discitis/osteomyelitis. He followed up with neurosurgery and was sent to ED for further management.   The patient was admitted to med-surg. No antibiotics were started pending aspiration of the lesions and cultures.. Interventional radiology was consulted. They tentatively plan for disc aspiration on Monday 06/20/2024.  The patient is complaining of worsening back pain today. Toradol  has been started, and IV Dilaudid  was started at a low dose.   Infectious disease has been consulted.   Assessment and Plan:  Lumbar discitis/osteomyelitis - Pt with history of lumbar fusion presented with worsening low back pain - MRI L-spine on 9/9 shows evidence of discitis and osteomyelitis at the T12-L1 disc space likely seeding from his chronic left hip prosthetic infection - Patient hemodynamically stable, afebrile, normal ESR with no white count - Neurosurgery following, recommends IR aspiration of disc space for culture prior to initiating antibiotics - Plan is for IR to aspirate the lumbar vertebral body  on 9/15, and send the aspirate for culture - ID was consulted - The patient has been started on toradol  for pain control - he is also receiving ultram  and robaxin  - PRN IV Dilaudid  frequency has been increased in frequency for severe, breakthrough pain - The patient has been afebrile since admission. WBC has been 7.8 and 8.0. - CRP is 2.1 up from 1.9 on 11/05/2023. Will monitor. -Blood cultures x 2 are pending.   S/p left hip hemiarthroplasty Chronic left hip prosthetic infection - Reports history of long-term antibiotics with doxycycline  due to intermittent drainage from surgical site - Denies any drainage or hip pain at the moment - Currently pending revision total hip arthro[facility] on 10/14/2024 at [facility] - Would likely benefit from an earlier revision - ID Consult has been called.   Trigeminal neuralgia - Continue carbamazepine    COPD - Chronic and stable - Continue home bronchodilators   Peripheral neuropathy - Continue gabapentin    Anxiety and depression - Continue duloxetine    GERD - Continue Protonix    Insomnia - Continue melatonin and Benadryl    DVT prophylaxis: SCDs     Code Status: Limited: Do not attempt resuscitation (DNR) -DNR-LIMITED -Do Not Intubate/DNI    Consults called: Neurosurgery, IR   Family Communication: No family at bedside  I have seen and examined this patient myself. I have spent 36 minutes in his evaluation and care.     Subjective: The patient is resting in bed with family at bedside. He states that he cannot get comfortable due to back pain.   Physical Exam: Vitals:   06/18/24 0030 06/18/24 0050 06/18/24 0448 06/18/24 0920  BP:  111/84 129/61 (!) 141/65  Pulse: 67 85 72 69  Resp: 16 17    Temp:  97.6 F (36.4 C) 97.6 F (36.4 C) 98.1 F (36.7 C)  TempSrc:  Oral Oral Oral  SpO2: 96% 100% 95% 97%  Weight:      Height:       Exam:  Constitutional:  The patient is awake, alert, and oriented x 3. Moderate distress from back  pain. Respiratory:  No increased work of breathing. No wheezes, rales, or rhonchi No tactile fremitus Cardiovascular:  Regular rate and rhythm No murmurs, ectopy, or gallups. No lateral PMI. No thrills. Abdomen:  Abdomen is soft, non-tender, non-distended No hernias, masses, or organomegaly Normoactive bowel sounds.  Musculoskeletal:  No cyanosis, clubbing, or edema Skin:  No rashes, lesions, ulcers palpation of skin: no induration or nodules Neurologic:  CN 2-12 intact Sensation all 4 extremities intact Psychiatric:  Mental status Mood, affect appropriate Orientation to person, place, time  judgment and insight appear intact  Data Reviewed:  CBC BMP MRI  Family Communication: Family at bedside.  Disposition: Status is: Inpatient Remains inpatient appropriate because: Need for aspiration of vertebral body and culture.   Planned Discharge Destination: TBD    Time spent: 48 minutes  Author: Bera Pinela, DO 06/18/2024 3:42 PM  For on call review [email or web address].

## 2024-06-18 NOTE — ED Notes (Signed)
 Received purple man, called floor to advise pt in route, Floor receptive.

## 2024-06-18 NOTE — Consult Note (Signed)
 Chief Complaint:  Back pain with imaging concerning for discitis  Procedure: T12-L1 Disc Aspiration  Referring Provider(s): Dr. Franky Gaul  Supervising Physician: Johann Sieving  Patient Status: [facility] - In-pt  History of Present Illness: Bryan Frey is a 78 y.o. male with a history of lumbar degenerative disc disease, arthritis, left hip hemiarthroplasty x 2 complicated by chronic infection of left hip prosthetics, and lumbar fusion at an outside facility. Patient reports falling approximately 5 weeks ago and hitting his back. States that he was unable to get up off the floor for about 40 minutes due to the pain. At that time he went to the ED where they did X-rays which were negative. Unfortunately, he has continued to have progressively worsening back pain since then and was evaluated by Ortho last week. They ordered an outpatient MRI which was read on 9/12 and showed concern for fluid in the disc space at T12-L1 with extensive marrow edema along the endplates and diffuse paraspinous soft tissue, causing some encroachment on the central canal without significant stenosis; highly suggestive of discitis and osteomyelitis. IR consulted for possible disc aspiration. Case and imaging reviewed and approved by Dr. JONETTA Johann.  Patient is resting in bed with family at the bedside. States that he has a constant pain in his mid-back that is made worse with ambulation. Pain will radiate around his torso and down towards bilateral hips. Reports that with movement he will get burning down bilateral legs. Also has constant tingling of the bottoms of his feet. He denies any numbness/tingling of his groin or down his legs, uncontrollable bowel/bladder habits, fevers/chills, chest pain, or shortness of breath. All questions and concerns answered at the bedside.   Patient is Full Code  Past Medical History:  Diagnosis Date   Acquired short leg syndrome on left    Anemia    vitamin b12 deficiency    Anxiety    with diagnosis   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 removed tumor and all is clear since then   Nephrolithiasis    Neuromuscular disorder (HCC)    facial nerve pain   Spinal stenosis of lumbar region    with neurogenic claudication   Stroke (HCC) 2010   heat stroke d/t a hot day and working under a house.no problems after   Trigeminal neuralgia     Past Surgical History:  Procedure Laterality Date   BACK SURGERY     CATARACT EXTRACTION W/PHACO Left 12/24/2017   Procedure: CATARACT EXTRACTION PHACO AND INTRAOCULAR LENS PLACEMENT (IOC);  Surgeon: Myrna Adine Anes, MD;  Location: [facility] ORS;  Service: Ophthalmology;  Laterality: Left;  Lot # [id number] H US : 00:30.4 AP%: 9.4 CDE: 2.84   CATARACT EXTRACTION W/PHACO Right 01/26/2018   Procedure: CATARACT EXTRACTION PHACO AND INTRAOCULAR LENS PLACEMENT (IOC) RIGHT;  Surgeon: Myrna Adine Anes, MD;  Location: [facility];  Service: Ophthalmology;  Laterality: Right;   COLONOSCOPY     COLONOSCOPY  12/03/2020   Procedure: COLONOSCOPY;  Surgeon: Toledo, Ladell POUR, MD;  Location: [facility] END[facility]COPY;  Service:  Gastroenterology;;   ESOPHAGOGASTRODUODENOSCOPY N/A 03/15/2024   Procedure: EGD (ESOPHAGOGASTRODUODENOSCOPY);  Surgeon: Toledo, Ladell POUR, MD;  Location: [facility] ENDOSCOPY;  Service: Gastroenterology;  Laterality: N/A;  Request Early AM Appt.   EYE SURGERY Bilateral    cataract extra tions   INCISION AND DRAINAGE HIP Left 06/06/2022   Procedure: IRRIGATION AND DEBRIDEMENT OF LEFT HIP WITH FEMORAL HEAD EXCHANGE;  Surgeon: Mardee Bryan SQUIBB, MD;  Location: [facility] OR[facility];  Service: Orthopedics;  Laterality: Left;   IR RADIOLOGIST  EVAL & MGMT  03/18/2017   JOINT REPLACEMENT Bilateral    left hip replaced x 2, right hip x 1   Left total hip arthroplasty  08/07/2010   LUMBAR LAMINECTOMY/DECOMPRESSION MICRODISCECTOMY  02/09/2012   Procedure: LUMBAR LAMINECTOMY/DECOMPRESSION MICRODISCECTOMY 2 LEVELS;  Surgeon: Victory Gens, MD;  Location: [facility];  Service: Neurosurgery;  Laterality: Bilateral;  Bilateral Lumbar two-three,lumbar four-five laminectomies   MALONEY DILATION  03/15/2024   Procedure: DILATION, ESOPHAGUS, USING MALONEY DILATOR;  Surgeon: Aundria, Ladell POUR, MD;  Location: [facility] ENDOSCOPY;  Service: Gastroenterology;;   NEPHROLITHOTOMY Right 02/09/2014   Procedure: NEPHROLITHOTOMY PERCUTANEOUS;  Surgeon: Garnette Shack, MD;  Location: [facility];  Service: Urology;  Laterality: Right;   Right total hip arthroplasty  07/13/2014   TOTAL HIP REVISION Left 05/14/2015   Procedure: TOTAL HIP REVISION;  Surgeon: Bryan SQUIBB Mardee, MD;  Location: [facility] OR[facility];  Service: Orthopedics;  Laterality: Left;   TOTAL HIP REVISION Left 04/06/2020   Procedure: TOTAL HIP REVISION;  Surgeon: Mardee Bryan SQUIBB, MD;  Location: [facility];  Service: Orthopedics;  Laterality: Left;    Allergies: Morphine  and codeine and Statins  Medications: Prior to Admission medications   Medication Sig Start Date End Date Taking? Authorizing Provider  aspirin  EC 81 MG tablet Take 81 mg by mouth daily. Swallow whole.   Yes [provider]  carbamazepine  (TEGRETOL ) 200 MG tablet Take 400 mg by mouth in the morning and at bedtime.   Yes [provider]  cholecalciferol  (VITAMIN D3) 25 MCG (1000 UNIT) tablet Take 1,000 Units by mouth daily.   Yes [provider]  Cholecalciferol  1.25 MG (50000 UT) capsule Take 50,000 Units by mouth once a week. Thursday 04/05/24 04/05/25 Yes [provider]  diphenhydramine -acetaminophen  (TYLENOL  PM) 25-500 MG TABS Take 2 tablets by mouth at bedtime.   Yes [provider]  doxycycline  (VIBRA -TABS)  100 MG tablet Take 1 tablet (100 mg total) by mouth 2 (two) times daily. Patient taking differently: Take 100 mg by mouth daily. 11/10/23  Yes Fayette Bodily, MD  DULoxetine  (CYMBALTA ) 60 MG capsule Take 60 mg by mouth 2 (two) times daily.   Yes [provider]  gabapentin  (NEURONTIN ) 100 MG capsule Take 100 mg by mouth in the morning. 04/04/24 04/04/25 Yes [provider]  gabapentin  (NEURONTIN ) 300 MG capsule Take 300 mg by mouth at bedtime.   Yes [provider]  MAGNESIUM  PO Take 1 capsule by mouth daily at 2 PM.   Yes [provider]  Melatonin 5 MG CAPS Take 5 mg by mouth at bedtime.   Yes [provider]  methocarbamol  (ROBAXIN ) 500 MG tablet Take 500 mg by mouth every 6 (six) hours as needed for muscle spasms. 06/09/24 06/19/24 Yes [provider]  pantoprazole  (PROTONIX ) 40 MG tablet Take 40 mg by mouth daily. 10/22/23 10/21/24 Yes [provider]  traMADol  (ULTRAM ) 50 MG tablet Take 1 tablet (50 mg total) by mouth every 4 (four) hours as needed for moderate pain. 06/07/22  Yes Charlene Debby BROCKS PA-C  TRELEGY ELLIPTA 200-62.5-25 MCG/ACT AEPB Inhale 1 puff into the lungs daily.   Yes [provider]  triamcinolone  (KENALOG ) 0.025 % cream Apply 1 Application topically at bedtime. On irritated areas on back 12/15/23  Yes [provider]  vitamin B-12 (CYANOCOBALAMIN ) 1000 MCG tablet Take 1,000 mcg by mouth daily.   Yes [provider]     Family History  Problem Relation Age of Onset   Anesthesia problems Neg Hx     Social History   Socioeconomic History   Marital status: Married    Spouse name: Terri   Number of children: 2   Years of education: 16   Highest education level: Not on file  Occupational History   Occupation: Emergency planning/management officer    Comment: retired  Tobacco Use   Smoking status: Never   Smokeless tobacco: Never  Vaping Use   Vaping status: Never Used  Substance and Sexual Activity    Alcohol use: No   Drug use: No    Types: Other-see comments    Comment: tramadol    Sexual activity: Not Currently  Other Topics Concern   Not on file  Social History Narrative   Lives with wife   Caffeine use: Drinks coffee/tea/soda occassionally.   Right handed   Social Drivers of Health   Financial Resource Strain: Low Risk  (05/10/2024)   Received from [facility]   Overall Financial Resource Strain (CARDIA)    Difficulty of Paying Living Expenses: Not hard at all  Food Insecurity: No Food Insecurity (06/18/2024)   Hunger Vital Sign    Worried About Running Out of Food in the Last Year: Never true    Ran Out of Food in the Last Year: Never true  Transportation Needs: No Transportation Needs (06/18/2024)   PRAPARE - Administrator, Civil Service (Medical): No    Lack of Transportation (Non-Medical): No  Physical Activity: Not on file  Stress: Not on file  Social Connections: Unknown (06/18/2024)   Social Connection and Isolation Panel    Frequency of Communication with Friends and Family: Three times a week    Frequency of Social Gatherings with Friends and Family: Three times a week    Attends Religious Services: Not on file    Active Member of Clubs or Organizations: Not on file    Attends Club or Organization Meetings: 1 to 4 times per year    Marital Status: Married     Review of Systems  Constitutional:  Negative for fever.  Musculoskeletal:  Positive for back pain and gait problem (secondary to pain).  Neurological:  Negative for numbness.  Patient denies any headache, chest pain, shortness of breath, abdominal pain, N/V, or fever/chills. All other systems are negative.   Vital Signs: BP (!) 141/65 (BP Location: Right Arm)   Pulse 69   Temp 98.1 F (36.7 C) (Oral)   Resp 17   Ht 5' 11 (1.803 m)   Wt 196 lb (88.9 kg)   SpO2 97%   BMI 27.34 kg/m    Physical Exam Constitutional:      Appearance: Normal appearance.  HENT:      Mouth/Throat:     Mouth: Mucous membranes are moist.     Pharynx: Oropharynx is clear.  Cardiovascular:     Rate and Rhythm: Normal rate and regular rhythm.     Heart sounds: Normal heart sounds.  Pulmonary:     Effort: Pulmonary effort is normal.  Breath sounds: Normal breath sounds.  Abdominal:     General: Abdomen is flat.     Palpations: Abdomen is soft.     Tenderness: There is no abdominal tenderness.  Musculoskeletal:     Comments: Minimal TTP of mid-lower back   Skin:    General: Skin is warm and dry.  Neurological:     Mental Status: He is alert and oriented to person, place, and time.  Psychiatric:        Behavior: Behavior normal.     Imaging: MR LUMBAR SPINE WO CONTRAST Result Date: 06/17/2024 EXAM: MRI LUMBAR SPINE 06/14/2024 06:56:12 PM TECHNIQUE: Multiplanar multisequence MRI of the lumbar spine was performed without the administration of intravenous contrast. COMPARISON: MRI lumbar spine 06/05/2020. CLINICAL HISTORY: Pt states fall 1 mo ago, lower back hit door frame. Pt having lower back pain radiating into bilateral legs. FINDINGS: BONES AND ALIGNMENT: Extensive marrow edema is present along the endplates at T12-L1. Solid fusion is present at L2-3, L3-4 and L4-5. SPINAL CORD: The conus terminates normally. SOFT TISSUES: Diffuse paraspinous soft tissue edema is present at T12-L1 with extension posteriorly, surrounding the posterior elements. Benign fluid collection is present posterior to the epidural space at the operative sites of L2-3, L3-4 and L4-5. L1-L2: No significant disc herniation. No spinal canal stenosis or neural foraminal narrowing. L2-L3: Mild residual foraminal narrowing at L2-3 is stable. L3-L4: No significant disc herniation. No spinal canal stenosis or neural foraminal narrowing. L4-L5: No significant disc herniation. No spinal canal stenosis or neural foraminal narrowing. L5-S1: Mild disc bulging is present at L5 to S1. Moderate facet hypertrophy  contributes to moderate foraminal stenosis bilaterally, similar to the prior study. T12-L1: Fluid is present in the disc space at T12-L1. There is some encroachment on the central canal without significant stenosis. IMPRESSION: 1. Fluid in the disc space at T12-L1 with extensive marrow edema along the endplates and diffuse paraspinous soft tissue, causing some encroachment on the central canal without significant stenosis. Findings are highly suggestive of discitis and osteomyelitis. 2. Solid fusion at L2-3, L3-4, and L4-5 with benign third collection posterior to the epidural space at the operative sites. Mild residual foraminal narrowing at L2-3 is stable. 3. Mild disc bulging at L5-S1 with moderate facet hypertrophy contributing to moderate foraminal stenosis bilaterally, similar to the prior study. Findings were discussed with Dr. Colon at 12:45 pm 06/17/2024 Electronically signed by: Lonni Necessary MD 06/17/2024 01:02 PM EDT RP Workstation: [id number]    Labs:  CBC: Recent Labs    11/05/23 1243 06/17/24 1704 06/18/24 0354  WBC 7.2 7.8 8.0  HGB 12.9* 12.7* 12.0*  HCT 40.7 40.3 36.1*  PLT 314 284 268    COAGS: No results for input(s): INR, APTT in the last 8760 hours.  BMP: Recent Labs    11/05/23 1243 06/17/24 1704 06/18/24 0354  NA 140 140 138  K 4.4 4.3 4.2  CL 103 100 103  CO2 27 28 25   GLUCOSE 104* 114* 97  BUN 21 18 16   CALCIUM  8.9 9.0 8.8*  CREATININE 1.00 1.15 1.07  GFRNONAA >60 >60 >60    LIVER FUNCTION TESTS: Recent Labs    11/05/23 1243 06/17/24 1704  BILITOT 0.6 0.6  AST 15 17  ALT 16 25  ALKPHOS 125 220*  PROT 7.4 6.7  ALBUMIN  4.0 3.7    TUMOR MARKERS: No results for input(s): AFPTM, CEA, CA199, CHROMGRNA in the last 8760 hours.  Assessment and Plan:  Discitis/Osteomyelitis of T12-L1: Bryan Frey is a 78 y.o. male with a history of lumbar DDD and lumbar fusion with a fall injuring his back approximately 5 weeks ago.  Patient has had progressively worsening back pain since his fall. He underwent MRI which revealed high suspicion for T12-L1 discitis due to fluid collection of the disc space. IR consulted for possible disc aspiration. Case and images reviewed and approved by Dr. JONETTA Faes. Procedure to be performed under moderate sedation.  -NPO at [facility] on 9/15 -INR pending -WBC 8.0; lactic 0.8 -ID to consult for antibiotic therapy  -Plan for tentative disc aspiration on Monday 9/15   Risks and benefits of aspiration were discussed with the patient including bleeding, infection, damage to adjacent structures, and sepsis.  All of the patient's questions were answered, patient is agreeable to proceed. Consent signed and in chart.   Thank you for allowing our service to participate in Bryan Frey 's care.    Electronically Signed: Glennon CHRISTELLA Bal, PA-C   06/18/2024, 12:06 PM     I spent a total of 40 Minutes in face to face in clinical consultation, greater than 50% of which was counseling/coordinating care for discitis with possible disc aspiration.

## 2024-06-18 NOTE — Plan of Care (Signed)

## 2024-06-19 ENCOUNTER — Inpatient Hospital Stay (HOSPITAL_COMMUNITY)

## 2024-06-19 DIAGNOSIS — M4646 Discitis, unspecified, lumbar region: Secondary | ICD-10-CM | POA: Diagnosis not present

## 2024-06-19 LAB — BASIC METABOLIC PANEL WITH GFR
Anion gap: 9 (ref 5–15)
BUN: 17 mg/dL (ref 8–23)
CO2: 27 mmol/L (ref 22–32)
Calcium: 8.5 mg/dL — ABNORMAL LOW (ref 8.9–10.3)
Chloride: 102 mmol/L (ref 98–111)
Creatinine, Ser: 1.09 mg/dL (ref 0.61–1.24)
GFR, Estimated: >60 mL/min (ref >60)
Glucose, Bld: 109 mg/dL — ABNORMAL HIGH (ref 70–99)
Potassium: 3.9 mmol/L (ref 3.5–5.1)
Sodium: 138 mmol/L (ref 135–145)

## 2024-06-19 LAB — CBC WITH DIFFERENTIAL/PLATELET
Abs Immature Granulocytes: 0.03 K/uL (ref 0.00–0.07)
Basophils Absolute: 0 K/uL (ref 0.0–0.1)
Basophils Relative: 0 %
Eosinophils Absolute: 0.4 K/uL (ref 0.0–0.5)
Eosinophils Relative: 5 %
HCT: 34.3 % — ABNORMAL LOW (ref 39.0–52.0)
Hemoglobin: 11.2 g/dL — ABNORMAL LOW (ref 13.0–17.0)
Immature Granulocytes: 0 %
Lymphocytes Relative: 28 %
Lymphs Abs: 2 K/uL (ref 0.7–4.0)
MCH: 30.5 pg (ref 26.0–34.0)
MCHC: 32.7 g/dL (ref 30.0–36.0)
MCV: 93.5 fL (ref 80.0–100.0)
Monocytes Absolute: 0.7 K/uL (ref 0.1–1.0)
Monocytes Relative: 10 %
Neutro Abs: 4.1 K/uL (ref 1.7–7.7)
Neutrophils Relative %: 57 %
Platelets: 249 K/uL (ref 150–400)
RBC: 3.67 MIL/uL — ABNORMAL LOW (ref 4.22–5.81)
RDW: 13.2 % (ref 11.5–15.5)
WBC: 7.3 K/uL (ref 4.0–10.5)
nRBC: 0 % (ref 0.0–0.2)

## 2024-06-19 NOTE — Progress Notes (Signed)
 Progress Note   Patient: Bryan Frey FMW:[id number] DOB: 1946/06/29 DOA: 06/17/2024     2 DOS: the patient was seen and examined on 06/19/2024   Brief hospital course: The patient is a 78 yr old man who presented to [facility] ED as sent by neurosurgery for management of his L-spine discitis/osteomyelitis. The patient carried a medical history significant for HTN, trigeminal neuralgia on carbamazepine , left hip hemiarthroplasty x 2 due to chronic infection of left hip prosthetics on and off long term antibiotics with Doxycycline , andxiety and depression, COPD, HLD, and lumbar fusion. Initial hip prosthetics was 2 years ago. Since then he has had persistent infection of his left hip prostheci with intermittent drainage from open wound on his lateral hip. 5 weeks ago the patient suffered a fall onto his back. The patient developed progressive back pain. He underwent an MRI on 06/14/2024 which demonstrated lumbar discitis/osteomyelitis. He followed up with neurosurgery and was sent to ED for further management.   The patient was admitted to med-surg. No antibiotics were started pending aspiration of the lesions and cultures.. Interventional radiology was consulted. They tentatively plan for disc aspiration on Monday 06/20/2024.  The patient states that his pain is improved since starting the toradol  and increased frequency of prn dilaudid .   Infectious disease has evaluated the patient. They have recommended MRI of the left hip as there is concern that this vertebral osteomyelitis may emanate from a chronic infection of the hip. He may benefit from earlier 2 stage revision of this ORIF.  Assessment and Plan:  Lumbar discitis/osteomyelitis - Pt with history of lumbar fusion presented with worsening low back pain - MRI L-spine on 9/9 shows evidence of discitis and osteomyelitis at the T12-L1 disc space likely seeding from his chronic left hip prosthetic infection - Patient hemodynamically stable, afebrile,  normal ESR with no white count - Neurosurgery following, recommends IR aspiration of disc space for culture prior to initiating antibiotics - Plan is for IR to aspirate the lumbar vertebral body on 9/15, and send the aspirate for culture - ID  have recommended MRI of the left hip as there is concern that this vertebral osteomyelitis may emanate from a chronic infection of the hip. He may benefit from earlier 2 stage revision of this ORIF. - ID also recommends initiation of IV daptomycin  and ceftriaxone after samples of the fluid have been collected during the I & D tomorrow and sent to the lab for culture. - The patient has been started on toradol  for pain control - he is also receiving ultram  and robaxin  - PRN IV Dilaudid  frequency has been increased in frequency for severe, breakthrough pain - The patient has been afebrile since admission. WBC has been 7.3 to 8.0. - CRP is 2.1 up from 1.9 on 11/05/2023. Will monitor. -Blood cultures x 2 are pending.   S/p left hip hemiarthroplasty Chronic left hip prosthetic infection - Reports history of long-term antibiotics with doxycycline  due to intermittent drainage from surgical site - Denies any drainage or hip pain at the moment - Currently pending revision total hip arthroplasty on 10/14/2024 at [facility] - Would likely benefit from an earlier revision - ID Consult has been called. They have ordered an MRI of the left hip to investigate possibility that OM of vertebral body may have been seeded from a chronic infection of this hip.   Trigeminal neuralgia - Continue carbamazepine    COPD - Chronic and stable - Continue home bronchodilators   Peripheral neuropathy - Continue gabapentin   Anxiety and depression - Continue duloxetine    GERD - Continue Protonix    Insomnia - Continue melatonin and Benadryl    DVT prophylaxis: SCDs     Code Status: Limited: Do not attempt resuscitation (DNR) -DNR-LIMITED -Do Not Intubate/DNI    Consults  called: Neurosurgery, IR   Family Communication: No family at bedside  I have seen and examined this patient myself. I have spent 36 minutes in his evaluation and care.     Subjective: The patient is resting comfortably. No new complaints.   Physical Exam: Vitals:   06/18/24 2021 06/19/24 0448 06/19/24 0844 06/19/24 1439  BP: 132/63 121/60 133/66 123/63  Pulse: 72 67 70 65  Resp: 16 16 18 16   Temp: 97.8 F (36.6 C) 97.8 F (36.6 C) 98 F (36.7 C) 98.8 F (37.1 C)  TempSrc: Oral Oral Oral Oral  SpO2: 95% 98% 95% 95%  Weight:      Height:       Exam:  Constitutional:  The patient is awake, alert, and oriented x 3. No acute distress. Respiratory:  No increased work of breathing. No wheezes, rales, or rhonchi No tactile fremitus Cardiovascular:  Regular rate and rhythm No murmurs, ectopy, or gallups. No lateral PMI. No thrills. Abdomen:  Abdomen is soft, non-tender, non-distended No hernias, masses, or organomegaly Normoactive bowel sounds.  Musculoskeletal:  No cyanosis, clubbing, or edema Skin:  No rashes, lesions, ulcers palpation of skin: no induration or nodules Neurologic:  CN 2-12 intact Sensation all 4 extremities intact Psychiatric:  Mental status Mood, affect appropriate Orientation to person, place, time  judgment and insight appear intact  Data Reviewed:  CBC BMP  Family Communication: None available  Disposition: Status is: Inpatient Remains inpatient appropriate because: Need for aspiration of vertebral body and culture.   Planned Discharge Destination: TBD    Time spent: 36 minutes  Author: Avid Guillette, DO 06/19/2024 3:06 PM  For on call review [email or web address].

## 2024-06-19 NOTE — Plan of Care (Signed)

## 2024-06-20 ENCOUNTER — Inpatient Hospital Stay (HOSPITAL_COMMUNITY)

## 2024-06-20 DIAGNOSIS — M4646 Discitis, unspecified, lumbar region: Secondary | ICD-10-CM | POA: Diagnosis not present

## 2024-06-20 DIAGNOSIS — M4645 Discitis, unspecified, thoracolumbar region: Secondary | ICD-10-CM | POA: Diagnosis not present

## 2024-06-20 DIAGNOSIS — Z96642 Presence of left artificial hip joint: Secondary | ICD-10-CM

## 2024-06-20 LAB — CBC WITH DIFFERENTIAL/PLATELET
Abs Immature Granulocytes: 0.03 K/uL (ref 0.00–0.07)
Basophils Absolute: 0 K/uL (ref 0.0–0.1)
Basophils Relative: 1 %
Eosinophils Absolute: 0.4 K/uL (ref 0.0–0.5)
Eosinophils Relative: 6 %
HCT: 34.7 % — ABNORMAL LOW (ref 39.0–52.0)
Hemoglobin: 11.3 g/dL — ABNORMAL LOW (ref 13.0–17.0)
Immature Granulocytes: 0 %
Lymphocytes Relative: 26 %
Lymphs Abs: 2.1 K/uL (ref 0.7–4.0)
MCH: 30.5 pg (ref 26.0–34.0)
MCHC: 32.6 g/dL (ref 30.0–36.0)
MCV: 93.8 fL (ref 80.0–100.0)
Monocytes Absolute: 0.8 K/uL (ref 0.1–1.0)
Monocytes Relative: 10 %
Neutro Abs: 4.5 K/uL (ref 1.7–7.7)
Neutrophils Relative %: 57 %
Platelets: 253 K/uL (ref 150–400)
RBC: 3.7 MIL/uL — ABNORMAL LOW (ref 4.22–5.81)
RDW: 13.1 % (ref 11.5–15.5)
WBC: 7.8 K/uL (ref 4.0–10.5)
nRBC: 0 % (ref 0.0–0.2)

## 2024-06-20 LAB — BASIC METABOLIC PANEL WITH GFR
Anion gap: 15 (ref 5–15)
BUN: 15 mg/dL (ref 8–23)
CO2: 24 mmol/L (ref 22–32)
Calcium: 8.6 mg/dL — ABNORMAL LOW (ref 8.9–10.3)
Chloride: 101 mmol/L (ref 98–111)
Creatinine, Ser: 1.05 mg/dL (ref 0.61–1.24)
GFR, Estimated: >60 mL/min (ref >60)
Glucose, Bld: 104 mg/dL — ABNORMAL HIGH (ref 70–99)
Potassium: 3.9 mmol/L (ref 3.5–5.1)
Sodium: 140 mmol/L (ref 135–145)

## 2024-06-20 LAB — MRSA NEXT GEN BY PCR, NASAL: MRSA by PCR Next Gen: NOT DETECTED

## 2024-06-20 MED ORDER — MIDAZOLAM HCL 2 MG/2ML IJ SOLN
INTRAMUSCULAR | Status: AC | PRN
  Administered 2024-06-20 (×2): 1 mg via INTRAVENOUS

## 2024-06-20 MED ORDER — SODIUM CHLORIDE 0.9 % IV SOLN
2.0000 g | INTRAVENOUS | Status: DC
  Administered 2024-06-20 – 2024-06-22 (×3): 2 g via INTRAVENOUS
  Filled 2024-06-20 (×3): qty 20

## 2024-06-20 MED ORDER — FENTANYL CITRATE (PF) 100 MCG/2ML IJ SOLN
INTRAMUSCULAR | Status: AC
  Filled 2024-06-20: qty 2

## 2024-06-20 MED ORDER — DAPTOMYCIN-SODIUM CHLORIDE 700-0.9 MG/100ML-% IV SOLN
700.0000 mg | Freq: Every day | INTRAVENOUS | Status: DC
Start: 2024-06-20 — End: 2024-06-22
  Administered 2024-06-20 – 2024-06-22 (×3): 700 mg via INTRAVENOUS
  Filled 2024-06-20 (×3): qty 100

## 2024-06-20 MED ORDER — LIDOCAINE 1 % OPTIME INJ - NO CHARGE
10.0000 mL | Freq: Once | INTRAMUSCULAR | Status: AC
  Administered 2024-06-20: 10 mL
  Filled 2024-06-20: qty 10

## 2024-06-20 MED ORDER — FENTANYL CITRATE (PF) 100 MCG/2ML IJ SOLN
INTRAMUSCULAR | Status: AC | PRN
  Administered 2024-06-20 (×2): 50 ug via INTRAVENOUS

## 2024-06-20 MED ORDER — MIDAZOLAM HCL 2 MG/2ML IJ SOLN
INTRAMUSCULAR | Status: AC
  Filled 2024-06-20: qty 2

## 2024-06-20 NOTE — Procedures (Signed)
 Pre procedural Dx: T12-L1 discitis  Post procedural Dx: Same  Technically successful CT guided aspiration of the T12-L1 level   EBL: None.   Complications: None immediate.   KANDICE Banner, MD Pager #: [phone]

## 2024-06-20 NOTE — Care Management Important Message (Signed)
 Important Message  Patient Details  Name: Bryan Frey MRN: [id number] Date of Birth: 29-May-1946   Important Message Given:  Yes - Medicare IM     Jon Cruel 06/20/2024, 11:53 AM

## 2024-06-20 NOTE — Plan of Care (Signed)
  Problem: Health Behavior/Discharge Planning: Goal: Ability to manage health-related needs will improve Outcome: Progressing   Problem: Clinical Measurements: Goal: Ability to maintain clinical measurements within normal limits will improve Outcome: Progressing Goal: Will remain free from infection Outcome: Progressing   Problem: Nutrition: Goal: Adequate nutrition will be maintained Outcome: Progressing   Problem: Coping: Goal: Level of anxiety will decrease Outcome: Progressing   

## 2024-06-20 NOTE — Progress Notes (Signed)
 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  Initial hip prosthetics was 2 years ago. Since then he has had persistent infection of his left hip prosthesis with intermittent drainage from open wound on his lateral hip.  He has suffered a fall onto his back 5 weeks back. This patient developed progressive back pain. He underwent an MRI on 06/14/2024 which demonstrated lumbar discitis /osteomyelitis. He followed up with neurosurgery and was sent to ED for further management.  ID has been consulted.  Assessment & Plan:   Principal Problem:   Lumbar discitis Active Problems:   Osteomyelitis of lumbar vertebra (HCC)   Lumbar discitis /Osteomyelitis: Patient with history of lumbar fusion presented with worsening lower back pain. MRI L-spine on 9/9 shows evidence of discitis and osteomyelitis at the T12-L1 disc space likely seeding from his chronic left hip prosthetic infection. Neurosurgery following, recommends IR aspiration of disc space for culture prior to initiating antibiotics. Plan is for IR to aspirate the lumbar vertebral body on 9/15, and send the aspirate for culture. ID  have recommended MRI of the left hip as there is concern that this vertebral osteomyelitis may emanate from a chronic infection of the hip. He may benefit from earlier 2 stage revision of this ORIF. ID also recommends initiation of IV daptomycin  and ceftriaxone  after samples of the fluid have been collected during the I & D today and sent to the lab for culture. Continue Toradol  for pain  control.  Continue Ultram  and Robaxin  Continue Dilaudid  as needed. Blood cultures x 2 are pending.   S/p left hip hemiarthroplasty: Chronic left hip prosthetic infection He reports history of long-term antibiotics with doxycycline  due to intermittent drainage from surgical site. Denies any drainage or hip pain at the moment Currently pending revision total hip arthroplasty on 10/14/2024 at [facility] Would likely benefit from an earlier revision ID has ordered MRI of the left hip to investigate possibility that OM of vertebral body may have been seeded from a chronic infection of this hip.   Trigeminal neuralgia: Continue carbamazepine .   COPD: Chronic and stable. Continue home bronchodilators.   Peripheral neuropathy: Continue gabapentin .   Anxiety and depression: Continue duloxetine .   GERD: Continue Protonix .   Insomnia: Continue melatonin and Benadryl .   DVT prophylaxis: SCDs Code Status: DNR Family Communication: No family at bed side. Disposition Plan:   Status is: Inpatient Remains inpatient appropriate because: Severity of illness.    Consultants:  Orthopedics  Procedures:  Antimicrobials:  Anti-infectives (From admission, onward)    None       Subjective: Patient was seen and examined at bedside. Overnight events noted. Patient reports having significant pain in back when he moves, at rest pain is controlled.   Patient is scheduled for IR aspiration of the vertebral body.  Objective: Vitals:   06/19/24 1439 06/19/24 1943 06/20/24 0533 06/20/24 1002  BP: 123/63 127/61 126/67 135/66  Pulse: 65 78 64 66  Resp: 16  15   Temp: 98.8 F (37.1 C) 98.3 F (36.8 C) 97.8 F (36.6 C) 97.9 F (36.6 C)  TempSrc: Oral Oral Oral Oral  SpO2: 95% 95% 95% 95%  Weight:      Height:       No intake or output data in the 24 hours ending 06/20/24 1200 Filed Weights   06/17/24 1651  Weight: 88.9 kg    Examination:  General exam: Appears calm and  comfortable, not in any acute distress. Respiratory system: Clear to auscultation. Respiratory effort normal.  RR 14 Cardiovascular system: S1 & S2 heard, RRR. No JVD, murmurs, rubs, gallops or clicks.  Gastrointestinal system: Abdomen is non distended, soft and nontender.  Normal bowel sounds heard. Back: Lower back tenderness noted. Central nervous system: Alert and oriented x 3. No focal neurological deficits. Extremities: No edema, no cyanosis, no clubbing Skin: No rashes, lesions or ulcers Psychiatry: Judgement and insight appear normal. Mood & affect appropriate.     Data Reviewed: I have personally reviewed following labs and imaging studies  CBC: Recent Labs  Lab 06/17/24 1704 06/18/24 0354 06/19/24 0449 06/20/24 0434  WBC 7.8 8.0 7.3 7.8  NEUTROABS  --   --  4.1 4.5  HGB 12.7* 12.0* 11.2* 11.3*  HCT 40.3 36.1* 34.3* 34.7*  MCV 96.6 94.8 93.5 93.8  PLT 284 268 249 253   Basic Metabolic Panel: Recent Labs  Lab 06/17/24 1704 06/18/24 0354 06/19/24 0449 06/20/24 0434  NA 140 138 138 140  K 4.3 4.2 3.9 3.9  CL 100 103 102 101  CO2 28 25 27 24   GLUCOSE 114* 97 109* 104*  BUN 18 16 17 15   CREATININE 1.15 1.07 1.09 1.05  CALCIUM  9.0 8.8* 8.5* 8.6*   GFR: Estimated Creatinine Clearance: 60.8 mL/min (by C-G formula based on SCr of 1.05 mg/dL). Liver Function Tests: Recent Labs  Lab 06/17/24 1704  AST 17  ALT 25  ALKPHOS 220*  BILITOT 0.6  PROT 6.7  ALBUMIN  3.7   No results for input(s): LIPASE, AMYLASE in the last 168 hours. No results for input(s): AMMONIA in the last 168 hours. Coagulation Profile: Recent Labs  Lab 06/18/24 1302  INR 1.1   Cardiac Enzymes: No results for input(s): CKTOTAL, CKMB, CKMBINDEX, TROPONINI in the last 168 hours. BNP (last 3 results) No results for input(s): PROBNP in the last 8760 hours. HbA1C: No results for input(s): HGBA1C in the last 72 hours. CBG: No results for input(s): GLUCAP in the last  168 hours. Lipid Profile: No results for input(s): CHOL, HDL, LDLCALC, TRIG, CHOLHDL, LDLDIRECT in the last 72 hours. Thyroid  Function Tests: No results for input(s): TSH, T4TOTAL, FREET4, T3FREE, THYROIDAB in the last 72 hours. Anemia Panel: No results for input(s): VITAMINB12, FOLATE, FERRITIN, TIBC, IRON, RETICCTPCT in the last 72 hours. Sepsis Labs: Recent Labs  Lab 06/17/24 1648 06/17/24 1913  LATICACIDVEN 0.9 0.8    Recent Results (from the past 240 hours)  Blood culture (routine x 2)     Status: None (Preliminary result)   Collection Time: 06/17/24  5:04 PM   Specimen: BLOOD  Result Value Ref Range Status   Specimen Description BLOOD LEFT ANTECUBITAL  Final   Special Requests   Final    BOTTLES DRAWN AEROBIC AND ANAEROBIC Blood Culture adequate volume   Culture   Final    NO GROWTH 3 DAYS Performed at [facility] Lab, [address] [place], [place] [address]    Report Status PENDING  Incomplete  Blood culture (routine x 2)     Status: None (Preliminary result)   Collection Time: 06/17/24  5:08  PM   Specimen: BLOOD LEFT FOREARM  Result Value Ref Range Status   Specimen Description BLOOD LEFT FOREARM  Final   Special Requests   Final    BOTTLES DRAWN AEROBIC AND ANAEROBIC Blood Culture results may not be optimal due to an inadequate volume of blood received in culture bottles   Culture   Final    NO GROWTH 3 DAYS Performed at [facility] Lab, [address] [place], [place] [address]    Report Status PENDING  Incomplete  MRSA Next Gen by PCR, Nasal     Status: None   Collection Time: 06/20/24  4:35 AM   Specimen: Nasal Mucosa; Nasal Swab  Result Value Ref Range Status   MRSA by PCR Next Gen NOT DETECTED NOT DETECTED Final    Comment: (NOTE) The GeneXpert MRSA Assay (FDA approved for NASAL specimens only), is one component of a comprehensive MRSA colonization surveillance program. It is not intended to diagnose MRSA  infection nor to guide or monitor treatment for MRSA infections. Test performance is not FDA approved in patients less than 5 years old. Performed at [facility] Lab, [address], [place], [place] [address]     Radiology Studies: MR HIP LEFT WO CONTRAST Result Date: 06/20/2024 CLINICAL DATA:  Back pain. History of chronic left hip prosthetic joint infection. EXAM: MR OF THE LEFT HIP WITHOUT CONTRAST TECHNIQUE: Multiplanar, multisequence MR imaging was performed. No intravenous contrast was administered. COMPARISON:  MRI of the left hip dated 06/04/2022. 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 Chronic changes of the left supra-acetabular iliac bone, which may reflect a combination of postsurgical changes and sequela of chronic infection. There is mild increased STIR hyperintense signal of the mid left supra-acetabular iliac bone, just superior to the level of the left acetabular screws. No discrete periarticular collection. No acute fracture or dislocation. Sacroiliac joints and pubic symphysis are anatomically aligned with mild degenerative arthropathy. Postoperative changes of the lower lumbar spine. Soft tissue and Muscle: Postsurgical changes overlying the bilateral hips. Asymmetric atrophy of the left gluteal musculature. There is nonspecific edema of the left gluteus maximus and the left iliacus muscles. No intramuscular collection. Hamstring tendon origins are intact. Other: No enlarged lymph nodes identified in the field of view. Visualized intrapelvic contents are grossly unremarkable. 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 Chronic changes of the left supra-acetabular iliac bone with mild edema like signal, which may reflect a combination of postsurgical changes and  chronic infection. Intramuscular edema of the overlying left gluteus maximus and iliacus muscles. No discrete periarticular or intramuscular collection. Electronically Signed   By: Harrietta Sherry M.D.   On: 06/20/2024 08:37   Scheduled Meds:  aspirin  EC  81 mg Oral Daily   carbamazepine   400 mg Oral BID   cholecalciferol   1,000 Units Oral Daily   cyanocobalamin   1,000 mcg Oral Daily   diphenhydrAMINE   50 mg Oral QHS   DULoxetine   60 mg Oral BID   gabapentin   300 mg Oral TID   magnesium  oxide  400 mg Oral Q1400   melatonin  5 mg Oral QHS   pantoprazole   40 mg Oral Daily   triamcinolone   1 Application Topical QHS   Continuous Infusions:   LOS: 3 days    Time spent: 50 mins    Darcel Dawley, MD [facility]  If 7PM-7AM, please contact night-coverage

## 2024-06-20 NOTE — Progress Notes (Signed)
 [facility]  Date of Admission:  06/17/2024     Reason for Follow Up: Lumbar discitis  Total days of antibiotics 0         ASSESSMENT:  [name] is a 78 y/o with history of lumbar fusion and left hip prosthetic joint infection intermittently on doxycycline  admitted with worsening back pain and found to have T12-L1 discitis.   [name] blood culture remain without growth to date and is afebrile with no leukocytosis. Scheduled for disc aspiration. Continue to hold antibiotics until aspiration is complete and will start broad coverage following procedure. Discussed plan of care for anticipated prolonged course of antibiotics going forward. Remaining medical and supportive care per Internal Medicine.   PLAN:  Continue to hold antibiotics.  Disc aspiration/culture pending per IR.  Following procedure will start broad spectrum coverage.  Remaining medical and supportive care per Internal Medicine.   Principal Problem:   Lumbar discitis Active Problems:   Osteomyelitis of lumbar vertebra (HCC)    aspirin  EC  81 mg Oral Daily   carbamazepine   400 mg Oral BID   cholecalciferol   1,000 Units Oral Daily   cyanocobalamin   1,000 mcg Oral Daily   diphenhydrAMINE   50 mg Oral QHS   DULoxetine   60 mg Oral BID   gabapentin   300 mg Oral TID   magnesium  oxide  400 mg Oral Q1400   melatonin  5 mg Oral QHS   pantoprazole   40 mg Oral Daily   triamcinolone   1 Application Topical QHS    SUBJECTIVE:  Afebrile overnight with no acute events. Awaiting aspiration. Wife at bedside.   Allergies  Allergen Reactions   Morphine  And Codeine Other (See Comments)    Hallucinations   Statins Other (See Comments)    Other reaction(s): Muscle Pain      Review of Systems: Review of Systems  Constitutional:  Negative for chills, fever and weight loss.  Respiratory:  Negative for cough, shortness of breath and wheezing.   Cardiovascular:  Negative for chest pain  and leg swelling.  Gastrointestinal:  Negative for abdominal pain, constipation, diarrhea, nausea and vomiting.  Musculoskeletal:  Positive for back pain.  Skin:  Negative for rash.      OBJECTIVE: Vitals:   06/19/24 1439 06/19/24 1943 06/20/24 0533 06/20/24 1002  BP: 123/63 127/61 126/67 135/66  Pulse: 65 78 64 66  Resp: 16  15   Temp: 98.8 F (37.1 C) 98.3 F (36.8 C) 97.8 F (36.6 C) 97.9 F (36.6 C)  TempSrc: Oral Oral Oral Oral  SpO2: 95% 95% 95% 95%  Weight:      Height:       Body mass index is 27.34 kg/m.  Physical Exam Constitutional:      General: He is not in acute distress.    Appearance: He is well-developed.  Cardiovascular:     Rate and Rhythm: Normal rate and regular rhythm.     Heart sounds: Normal heart sounds.  Pulmonary:     Effort: Pulmonary effort is normal.     Breath sounds: Normal breath sounds.  Skin:    General: Skin is warm and dry.  Neurological:     Mental Status: He is alert and oriented to person, place, and time.  Psychiatric:        Mood and Affect: Mood normal.     Lab Results Lab Results  Component Value Date   WBC 7.8 06/20/2024   HGB 11.3 (L) 06/20/2024  HCT 34.7 (L) 06/20/2024   MCV 93.8 06/20/2024   PLT 253 06/20/2024    Lab Results  Component Value Date   CREATININE 1.05 06/20/2024   BUN 15 06/20/2024   NA 140 06/20/2024   K 3.9 06/20/2024   CL 101 06/20/2024   CO2 24 06/20/2024    Lab Results  Component Value Date   ALT 25 06/17/2024   AST 17 06/17/2024   ALKPHOS 220 (H) 06/17/2024   BILITOT 0.6 06/17/2024     Microbiology: Recent Results (from the past 240 hours)  Blood culture (routine x 2)     Status: None (Preliminary result)   Collection Time: 06/17/24  5:04 PM   Specimen: BLOOD  Result Value Ref Range Status   Specimen Description BLOOD LEFT ANTECUBITAL  Final   Special Requests   Final    BOTTLES DRAWN AEROBIC AND ANAEROBIC Blood Culture adequate volume   Culture   Final    NO GROWTH 3  DAYS Performed at [facility] Lab, [address], [place], [place] [address]    Report Status PENDING  Incomplete  Blood culture (routine x 2)     Status: None (Preliminary result)   Collection Time: 06/17/24  5:08 PM   Specimen: BLOOD LEFT FOREARM  Result Value Ref Range Status   Specimen Description BLOOD LEFT FOREARM  Final   Special Requests   Final    BOTTLES DRAWN AEROBIC AND ANAEROBIC Blood Culture results may not be optimal due to an inadequate volume of blood received in culture bottles   Culture   Final    NO GROWTH 3 DAYS Performed at [facility] Lab, [address] [place], [place] [address]    Report Status PENDING  Incomplete  MRSA Next Gen by PCR, Nasal     Status: None   Collection Time: 06/20/24  4:35 AM   Specimen: Nasal Mucosa; Nasal Swab  Result Value Ref Range Status   MRSA by PCR Next Gen NOT DETECTED NOT DETECTED Final    Comment: (NOTE) The GeneXpert MRSA Assay (FDA approved for NASAL specimens only), is one component of a comprehensive MRSA colonization surveillance program. It is not intended to diagnose MRSA infection nor to guide or monitor treatment for MRSA infections. Test performance is not FDA approved in patients less than 79 years old. Performed at [facility] Lab, [address], [place], [place] [address]     I have personally spent 26 minutes involved in face-to-face and non-face-to-face activities for this patient on the day of the visit. Professional time spent includes the following activities: preparing to see the patient (review of tests), performing a medically appropriate examination, ordering medications, communicating with other health care professionals, documenting clinical information in the EMR, communicating results and counseling patient and family regarding medication and plan of care, and care coordination.   Bryan Derry Kassel, NP [facility]  06/20/2024  11:16  AM

## 2024-06-21 ENCOUNTER — Other Ambulatory Visit: Payer: Self-pay

## 2024-06-21 DIAGNOSIS — M4646 Discitis, unspecified, lumbar region: Secondary | ICD-10-CM | POA: Diagnosis not present

## 2024-06-21 DIAGNOSIS — M4645 Discitis, unspecified, thoracolumbar region: Secondary | ICD-10-CM | POA: Diagnosis not present

## 2024-06-21 DIAGNOSIS — Z96642 Presence of left artificial hip joint: Secondary | ICD-10-CM | POA: Diagnosis not present

## 2024-06-21 LAB — CK: Total CK: 44 U/L — ABNORMAL LOW (ref 49–397)

## 2024-06-21 NOTE — Progress Notes (Signed)
 [facility]  Date of Admission:  06/17/2024     Reason for Follow Up: Lumbar discitis  Total days of antibiotics 2         ASSESSMENT:  Bryan Frey is a 78 y/o with history of lumbar fusion and left hip prosthetic joint infection intermittently on doxycycline  admitted with worsening back pain and found to have T12-L1 discitis.   Aspiration preformed with specimen with no organisms on gram stain and culture without growth in <24 hours. Has history of MRSA in the past. Blood cultures remain without growth although he was on doxycycline  prior to presentation.  Tolerating antibiotics with no adverse side effects. CK level at 44 and will continue therapeutic drug monitoring. Discussed plan of care to place PICC line and await culture results for final antibiotic plan. Continue current dose of Daptomycin  and Ceftriaxone . Post-aspiration care per IR. Remaining medical and supportive care per Internal Medicine.    PLAN:  Continue current dose of Daptomycin  and Ceftraixone.  Monitor cultures for organisms and narrow antibiotics as able.  Therapeutic drug monitoring of CK levels while on Daptomycin . Post-aspiration care per IR.  Place PICC line. Remaining medical and supportive care per Internal Medicine.  Principal Problem:   Lumbar discitis Active Problems:   Osteomyelitis of lumbar vertebra (HCC)    aspirin  EC  81 mg Oral Daily   carbamazepine   400 mg Oral BID   cholecalciferol   1,000 Units Oral Daily   cyanocobalamin   1,000 mcg Oral Daily   diphenhydrAMINE   50 mg Oral QHS   DULoxetine   60 mg Oral BID   gabapentin   300 mg Oral TID   magnesium  oxide  400 mg Oral Q1400   melatonin  5 mg Oral QHS   pantoprazole   40 mg Oral Daily   triamcinolone   1 Application Topical QHS    SUBJECTIVE:  Afebrile overnight with no acute events. Tolerating antibiotics with no adverse side effects.   Allergies  Allergen Reactions   Morphine  And Codeine Other (See  Comments)    Hallucinations   Statins Other (See Comments)    Other reaction(s): Muscle Pain      Review of Systems: Review of Systems  Constitutional:  Negative for chills, fever and weight loss.  Respiratory:  Negative for cough, shortness of breath and wheezing.   Cardiovascular:  Negative for chest pain and leg swelling.  Gastrointestinal:  Negative for abdominal pain, constipation, diarrhea, nausea and vomiting.  Musculoskeletal:  Positive for back pain.  Skin:  Negative for rash.      OBJECTIVE: Vitals:   06/20/24 1550 06/20/24 2100 06/21/24 0432 06/21/24 0800  BP: (!) 136/58 129/68 121/60 138/65  Pulse: 77 76 67 72  Resp: 16 20 16 15   Temp: 98.2 F (36.8 C) 98.1 F (36.7 C)  97.9 F (36.6 C)  TempSrc: Oral Oral  Oral  SpO2: 95%  95% 96%  Weight:      Height:       Body mass index is 27.34 kg/m.  Physical Exam Constitutional:      General: He is not in acute distress.    Appearance: He is well-developed.  Cardiovascular:     Rate and Rhythm: Normal rate and regular rhythm.     Heart sounds: Normal heart sounds.  Pulmonary:     Effort: Pulmonary effort is normal.     Breath sounds: Normal breath sounds.  Skin:    General: Skin is warm and dry.  Neurological:  Mental Status: He is alert and oriented to person, place, and time.  Psychiatric:        Mood and Affect: Mood normal.     Lab Results Lab Results  Component Value Date   WBC 7.8 06/20/2024   HGB 11.3 (L) 06/20/2024   HCT 34.7 (L) 06/20/2024   MCV 93.8 06/20/2024   PLT 253 06/20/2024    Lab Results  Component Value Date   CREATININE 1.05 06/20/2024   BUN 15 06/20/2024   NA 140 06/20/2024   K 3.9 06/20/2024   CL 101 06/20/2024   CO2 24 06/20/2024    Lab Results  Component Value Date   ALT 25 06/17/2024   AST 17 06/17/2024   ALKPHOS 220 (H) 06/17/2024   BILITOT 0.6 06/17/2024     Microbiology: Recent Results (from the past 240 hours)  Blood culture (routine x 2)      Status: None (Preliminary result)   Collection Time: 06/17/24  5:04 PM   Specimen: BLOOD  Result Value Ref Range Status   Specimen Description BLOOD LEFT ANTECUBITAL  Final   Special Requests   Final    BOTTLES DRAWN AEROBIC AND ANAEROBIC Blood Culture adequate volume   Culture   Final    NO GROWTH 4 DAYS Performed at [facility], [address] [place], [place] [address]    Report Status PENDING  Incomplete  Blood culture (routine x 2)     Status: None (Preliminary result)   Collection Time: 06/17/24  5:08 PM   Specimen: BLOOD LEFT FOREARM  Result Value Ref Range Status   Specimen Description BLOOD LEFT FOREARM  Final   Special Requests   Final    BOTTLES DRAWN AEROBIC AND ANAEROBIC Blood Culture results may not be optimal due to an inadequate volume of blood received in culture bottles   Culture   Final    NO GROWTH 4 DAYS Performed at [facility], [address] [place], [place] [address]    Report Status PENDING  Incomplete  MRSA Next Gen by PCR, Nasal     Status: None   Collection Time: 06/20/24  4:35 AM   Specimen: Nasal Mucosa; Nasal Swab  Result Value Ref Range Status   MRSA by PCR Next Gen NOT DETECTED NOT DETECTED Final    Comment: (NOTE) The GeneXpert MRSA Assay (FDA approved for NASAL specimens only), is one component of a comprehensive MRSA colonization surveillance program. It is not intended to diagnose MRSA infection nor to guide or monitor treatment for MRSA infections. Test performance is not FDA approved in patients less than 70 years old. Performed at [facility], [address] [place], [place] [address]   Aerobic/Anaerobic Culture w Gram Stain (surgical/deep wound)     Status: None (Preliminary result)   Collection Time: 06/20/24  1:57 PM   Specimen: Wound; Tissue  Result Value Ref Range Status   Specimen Description WOUND  Final   Special Requests INTERVERTEBRAL  DISC SYRINGE  Final   Gram Stain NO WBC SEEN NO ORGANISMS  SEEN   Final   Culture   Final    NO GROWTH < 24 HOURS Performed at [facility] Lab, [address], [place], [place] [address]    Report Status PENDING  Incomplete    I have personally spent 28 minutes involved in face-to-face and non-face-to-face activities for this patient on the day of the visit. Professional time spent includes the following activities: preparing to  see the patient (review of tests), performing a medically appropriate examination, ordering medications, communicating with other health care professionals, documenting clinical information in the EMR, communicating results and counseling patient regarding medication and plan of care, and care coordination.    Greg Shantina Chronister, NP [facility]  06/21/2024  12:31 PM

## 2024-06-21 NOTE — Progress Notes (Signed)
 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  Initial hip prosthetics was 2 years ago. Since then he has had persistent infection of his left hip prosthesis with intermittent drainage from open wound on his lateral hip.  He has suffered a fall onto his back 5 weeks back. This patient developed progressive back pain. He underwent an MRI on 06/14/2024 which demonstrated lumbar discitis /osteomyelitis. He followed up with neurosurgery and was sent to ED for further management.  ID has been consulted.  Assessment & Plan:   Principal Problem:   Lumbar discitis Active Problems:   Osteomyelitis of lumbar vertebra (HCC)   Lumbar discitis /Osteomyelitis: Patient with history of lumbar fusion presented with worsening lower back pain. MRI L-spine on 9/9 shows evidence of discitis and osteomyelitis at the T12-L1 disc space likely seeding from his chronic left hip prosthetic infection. Neurosurgery following, recommends IR aspiration of disc space for culture prior to initiating antibiotics. ID recommended MRI of the left hip as there is concern that this vertebral osteomyelitis may emanate from a chronic infection of the hip. He may benefit from earlier 2 stage revision of this ORIF. ID also recommends initiation of IV daptomycin  and ceftriaxone  after samples of the fluid have been collected during the I & D  and sent to the lab for culture. S/p successful CT guided aspiration of T12-L1 level. Continue daptomycin  and ceftriaxone  , Needs PICC line Continue Toradol  for pain  control.  Continue Ultram  and Robaxin  Continue Dilaudid  as needed. Blood cultures x 2 are pending.   S/p left hip hemiarthroplasty: Chronic left hip prosthetic infection: He reports history of long-term antibiotics with doxycycline  due to intermittent drainage from surgical site. Denies any drainage or hip pain at the moment. Currently pending revision total hip arthroplasty on 10/14/2024 at [place]. Would likely benefit from an earlier revision ID has ordered MRI of the left hip to investigate possibility that OM of vertebral body may have been seeded from a chronic infection of this hip.   Trigeminal neuralgia: Continue carbamazepine .   COPD: Chronic and stable. Continue home bronchodilators.   Peripheral neuropathy: Continue gabapentin .   Anxiety and depression: Continue duloxetine .   GERD: Continue Protonix .   Insomnia: Continue melatonin and Benadryl .   DVT prophylaxis: SCDs Code Status: DNR Family Communication: No family at bed side. Disposition Plan:   Status is: Inpatient Remains inpatient appropriate because: Severity of illness.    Consultants:  Orthopedics  Procedures:  Antimicrobials:  Anti-infectives (From admission, onward)    Start     Dose/Rate Route Frequency Ordered Stop   06/20/24 1700  cefTRIAXone  (ROCEPHIN ) 2 g in sodium chloride  0.9 % 100 mL IVPB        2 g 200 mL/hr over 30 Minutes Intravenous Every 24 hours 06/20/24 1523     06/20/24 1615  DAPTOmycin  (CUBICIN ) IVPB 700 mg/100mL premix        700 mg 200 mL/hr over 30 Minutes Intravenous Daily 06/20/24 1523         Subjective: Patient was seen and examined at bedside. Overnight events noted. Patient  reports pain in the back has improved, s/p IR aspiration of fluid.  Objective: Vitals:   06/20/24 1550 06/20/24 2100 06/21/24 0432 06/21/24 0800  BP: (!) 136/58 129/68 121/60 138/65  Pulse: 77 76 67 72  Resp: 16 20 16 15   Temp: 98.2 F (36.8 C) 98.1 F (36.7 C)  97.9 F (36.6 C)   TempSrc: Oral Oral  Oral  SpO2: 95%  95% 96%  Weight:      Height:        Intake/Output Summary (Last 24 hours) at 06/21/2024 1314 Last data filed at 06/21/2024 1220 Gross per 24 hour  Intake 420 ml  Output --  Net 420 ml   Filed Weights   06/17/24 1651  Weight: 88.9 kg    Examination:  General exam: Appears calm and comfortable, not in any acute distress. Respiratory system: CTA Bilaterally. Respiratory effort normal.  RR 14 Cardiovascular system: S1 & S2 heard, RRR. No JVD, murmurs, rubs, gallops or clicks.  Gastrointestinal system: Abdomen is non distended, soft and nontender.  Normal bowel sounds heard. Back: Lower back tenderness noted. Central nervous system: Alert and oriented x 3. No focal neurological deficits. Extremities: No edema, no cyanosis, no clubbing Skin: No rashes, lesions or ulcers Psychiatry: Judgement and insight appear normal. Mood & affect appropriate.     Data Reviewed: I have personally reviewed following labs and imaging studies  CBC: Recent Labs  Lab 06/17/24 1704 06/18/24 0354 06/19/24 0449 06/20/24 0434  WBC 7.8 8.0 7.3 7.8  NEUTROABS  --   --  4.1 4.5  HGB 12.7* 12.0* 11.2* 11.3*  HCT 40.3 36.1* 34.3* 34.7*  MCV 96.6 94.8 93.5 93.8  PLT 284 268 249 253   Basic Metabolic Panel: Recent Labs  Lab 06/17/24 1704 06/18/24 0354 06/19/24 0449 06/20/24 0434  NA 140 138 138 140  K 4.3 4.2 3.9 3.9  CL 100 103 102 101  CO2 28 25 27 24   GLUCOSE 114* 97 109* 104*  BUN 18 16 17 15   CREATININE 1.15 1.07 1.09 1.05  CALCIUM  9.0 8.8* 8.5* 8.6*   GFR: Estimated Creatinine Clearance: 60.8 mL/min (by C-G formula based on SCr of 1.05 mg/dL). Liver Function Tests: Recent Labs  Lab 06/17/24 1704  AST 17  ALT 25  ALKPHOS 220*  BILITOT 0.6  PROT 6.7  ALBUMIN  3.7   No results for input(s): LIPASE, AMYLASE in the last 168 hours. No results for input(s): AMMONIA in the last 168 hours. Coagulation Profile: Recent Labs  Lab  06/18/24 1302  INR 1.1   Cardiac Enzymes: Recent Labs  Lab 06/21/24 0642  CKTOTAL 44*   BNP (last 3 results) No results for input(s): PROBNP in the last 8760 hours. HbA1C: No results for input(s): HGBA1C in the last 72 hours. CBG: No results for input(s): GLUCAP in the last 168 hours. Lipid Profile: No results for input(s): CHOL, HDL, LDLCALC, TRIG, CHOLHDL, LDLDIRECT in the last 72 hours. Thyroid  Function Tests: No results for input(s): TSH, T4TOTAL, FREET4, T3FREE, THYROIDAB in the last 72 hours. Anemia Panel: No results for input(s): VITAMINB12, FOLATE, FERRITIN, TIBC, IRON, RETICCTPCT in the last 72 hours. Sepsis Labs: Recent Labs  Lab 06/17/24 1648 06/17/24 1913  LATICACIDVEN 0.9 0.8    Recent Results (from the past 240 hours)  Blood culture (routine x 2)     Status: None (Preliminary result)   Collection Time: 06/17/24  5:04 PM   Specimen: BLOOD  Result Value Ref Range Status   Specimen Description BLOOD LEFT  ANTECUBITAL  Final   Special Requests   Final    BOTTLES DRAWN AEROBIC AND ANAEROBIC Blood Culture adequate volume   Culture   Final    NO GROWTH 4 DAYS Performed at [facility], [address] [place], [place] [address]    Report Status PENDING  Incomplete  Blood culture (routine x 2)     Status: None (Preliminary result)   Collection Time: 06/17/24  5:08 PM   Specimen: BLOOD LEFT FOREARM  Result Value Ref Range Status   Specimen Description BLOOD LEFT FOREARM  Final   Special Requests   Final    BOTTLES DRAWN AEROBIC AND ANAEROBIC Blood Culture results may not be optimal due to an inadequate volume of blood received in culture bottles   Culture   Final    NO GROWTH 4 DAYS Performed at [facility], [address], [place], [place] [address]    Report Status PENDING  Incomplete  MRSA Next Gen by PCR, Nasal     Status: None   Collection Time: 06/20/24  4:35 AM   Specimen: Nasal Mucosa; Nasal Swab   Result Value Ref Range Status   MRSA by PCR Next Gen NOT DETECTED NOT DETECTED Final    Comment: (NOTE) The GeneXpert MRSA Assay (FDA approved for NASAL specimens only), is one component of a comprehensive MRSA colonization surveillance program. It is not intended to diagnose MRSA infection nor to guide or monitor treatment for MRSA infections. Test performance is not FDA approved in patients less than 59 years old. Performed at [facility], [address], [place], [place] [address]   Aerobic/Anaerobic Culture w Gram Stain (surgical/deep wound)     Status: None (Preliminary result)   Collection Time: 06/20/24  1:57 PM   Specimen: Wound; Tissue  Result Value Ref Range Status   Specimen Description WOUND  Final   Special Requests INTERVERTEBRAL  DISC SYRINGE  Final   Gram Stain NO WBC SEEN NO ORGANISMS SEEN   Final   Culture   Final    NO GROWTH < 24 HOURS Performed at [facility], [address] [place], [place] [address]    Report Status PENDING  Incomplete    Radiology Studies: US  EKG SITE RITE Result Date: 06/21/2024 If Site Rite image not attached, placement could not be confirmed due to current cardiac rhythm.  CT GUIDED NEEDLE PLACEMENT Result Date: 06/20/2024 INDICATION: Discitis suspected at T12-L1 EXAM: CT-guided disc aspiration TECHNIQUE: Multidetector CT imaging of the lumbar spine was performed following the standard protocol without IV contrast. RADIATION DOSE REDUCTION: This exam was performed according to the departmental dose-optimization program which includes automated exposure control, adjustment of the mA and/or kV according to patient size and/or use of iterative reconstruction technique. MEDICATIONS: The patient is currently admitted to the hospital and receiving intravenous antibiotics. The antibiotics were administered within an appropriate time frame prior to the initiation of the procedure. ANESTHESIA/SEDATION: Moderate (conscious) sedation  was employed during this procedure. A total of Versed  2 mg and Fentanyl  100 mcg was administered intravenously by the radiology nurse. Total intra-service moderate Sedation Time: 10 minutes. The patient's level of consciousness and vital signs were monitored continuously by radiology nursing throughout the procedure under my direct supervision. COMPLICATIONS: None immediate. PROCEDURE: Informed written consent was obtained from the patient after a thorough discussion of the procedural risks, benefits and alternatives. All questions were addressed. Maximal Sterile Barrier Technique was utilized including caps, mask, sterile gowns,  sterile gloves, sterile drape, hand hygiene and skin antiseptic. A timeout was performed prior to the initiation of the procedure. In a low position with radiopaque markers on the lumbar region, axial images of the spine were obtained. Anatomy was reviewed and measurements were obtained. Patient was then marked, prepped and draped in usual sterile fashion. Anesthesia was achieved by infiltration of subcutaneous tissue to the level of the paraspinous muscles with 1% lidocaine . 22 gauge spinal needle was then advanced with repeat axial CT imaging for guidance repositioning. The interspinous disc could not be penetrated with needle secondary to degenerative changes of the thoracolumbar spine therefore the needle was advanced into a paraspinous location and small aliquots normal saline was injected and then aspirated repeatedly to get a sample sent for culture and sensitivity. The needle was then removed. Sterile dressing applied. IMPRESSION: Satisfactory T12-L1 spinal aspiration. Electronically Signed   By: Cordella Banner   On: 06/20/2024 15:29   MR HIP LEFT WO CONTRAST Result Date: 06/20/2024 CLINICAL DATA:  Back pain. History of chronic left hip prosthetic joint infection. EXAM: MR OF THE LEFT HIP WITHOUT CONTRAST TECHNIQUE: Multiplanar, multisequence MR imaging was performed. No  intravenous contrast was administered. COMPARISON:  MRI of the left hip dated 06/04/2022. 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 Chronic changes of the left supra-acetabular iliac bone, which may reflect a combination of postsurgical changes and sequela of chronic infection. There is mild increased STIR hyperintense signal of the mid left supra-acetabular iliac bone, just superior to the level of the left acetabular screws. No discrete periarticular collection. No acute fracture or dislocation. Sacroiliac joints and pubic symphysis are anatomically aligned with mild degenerative arthropathy. Postoperative changes of the lower lumbar spine. Soft tissue and Muscle: Postsurgical changes overlying the bilateral hips. Asymmetric atrophy of the left gluteal musculature. There is nonspecific edema of the left gluteus maximus and the left iliacus muscles. No intramuscular collection. Hamstring tendon origins are intact. Other: No enlarged lymph nodes identified in the field of view. Visualized intrapelvic contents are grossly unremarkable. 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 Chronic changes of the left supra-acetabular iliac bone with mild edema like signal, which may reflect a combination of postsurgical changes and chronic infection. Intramuscular edema of the overlying left gluteus maximus and iliacus muscles. No discrete periarticular or intramuscular collection. Electronically Signed   By: Harrietta Sherry M.D.   On: 06/20/2024 08:37   Scheduled Meds:  aspirin  EC  81 mg Oral Daily   carbamazepine   400 mg Oral BID   cholecalciferol   1,000 Units Oral Daily   cyanocobalamin   1,000 mcg Oral Daily   diphenhydrAMINE   50 mg Oral QHS   DULoxetine   60 mg Oral BID   gabapentin   300 mg Oral TID    magnesium  oxide  400 mg Oral Q1400   melatonin  5 mg Oral QHS   pantoprazole   40 mg Oral Daily   triamcinolone   1 Application Topical QHS   Continuous Infusions:  cefTRIAXone  (ROCEPHIN )  IV 2 g (06/20/24 1718)   DAPTOmycin  700 mg (06/20/24 1615)     LOS: 4 days    Time spent: 35 mins    Darcel Dawley, MD [facility]   If 7PM-7AM, please contact night-coverage

## 2024-06-22 DIAGNOSIS — Z96642 Presence of left artificial hip joint: Secondary | ICD-10-CM | POA: Diagnosis not present

## 2024-06-22 DIAGNOSIS — M4646 Discitis, unspecified, lumbar region: Secondary | ICD-10-CM | POA: Diagnosis not present

## 2024-06-22 DIAGNOSIS — M4645 Discitis, unspecified, thoracolumbar region: Secondary | ICD-10-CM | POA: Diagnosis not present

## 2024-06-22 LAB — CULTURE, BLOOD (ROUTINE X 2)
Culture: NO GROWTH
Culture: NO GROWTH
Special Requests: ADEQUATE

## 2024-06-22 LAB — MAGNESIUM: Magnesium: 2 mg/dL (ref 1.7–2.4)

## 2024-06-22 LAB — BASIC METABOLIC PANEL WITH GFR
Anion gap: 17 — ABNORMAL HIGH (ref 5–15)
BUN: 14 mg/dL (ref 8–23)
CO2: 24 mmol/L (ref 22–32)
Calcium: 8.5 mg/dL — ABNORMAL LOW (ref 8.9–10.3)
Chloride: 98 mmol/L (ref 98–111)
Creatinine, Ser: 1.15 mg/dL (ref 0.61–1.24)
GFR, Estimated: >60 mL/min (ref >60)
Glucose, Bld: 99 mg/dL (ref 70–99)
Potassium: 3.9 mmol/L (ref 3.5–5.1)
Sodium: 139 mmol/L (ref 135–145)

## 2024-06-22 LAB — CBC
HCT: 35.5 % — ABNORMAL LOW (ref 39.0–52.0)
Hemoglobin: 11.4 g/dL — ABNORMAL LOW (ref 13.0–17.0)
MCH: 30.3 pg (ref 26.0–34.0)
MCHC: 32.1 g/dL (ref 30.0–36.0)
MCV: 94.4 fL (ref 80.0–100.0)
Platelets: 251 K/uL (ref 150–400)
RBC: 3.76 MIL/uL — ABNORMAL LOW (ref 4.22–5.81)
RDW: 13.1 % (ref 11.5–15.5)
WBC: 7.7 K/uL (ref 4.0–10.5)
nRBC: 0 % (ref 0.0–0.2)

## 2024-06-22 LAB — PHOSPHORUS: Phosphorus: 3.5 mg/dL (ref 2.5–4.6)

## 2024-06-22 MED ORDER — GABAPENTIN 300 MG PO CAPS: 300.0000 mg | ORAL_CAPSULE | Freq: Every day | ORAL | 0 refills | Status: AC

## 2024-06-22 MED ORDER — CHLORHEXIDINE GLUCONATE CLOTH 2 % EX PADS
6.0000 | MEDICATED_PAD | Freq: Every day | CUTANEOUS | Status: DC
  Administered 2024-06-22: 6 via TOPICAL

## 2024-06-22 MED ORDER — CEFTRIAXONE IV (FOR PTA / DISCHARGE USE ONLY): 2.0000 g | INTRAVENOUS | 0 refills | Status: AC

## 2024-06-22 MED ORDER — DAPTOMYCIN IV (FOR PTA / DISCHARGE USE ONLY): 700.0000 mg | INTRAVENOUS | 0 refills | Status: AC

## 2024-06-22 MED ORDER — HEPARIN SOD (PORK) LOCK FLUSH 100 UNIT/ML IV SOLN
250.0000 [IU] | INTRAVENOUS | Status: AC | PRN
  Administered 2024-06-22: 250 [IU]
  Filled 2024-06-22: qty 2.5

## 2024-06-22 MED ORDER — TRAMADOL HCL 50 MG PO TABS: 50.0000 mg | ORAL_TABLET | ORAL | 0 refills | Status: AC | PRN

## 2024-06-22 MED ORDER — SODIUM CHLORIDE 0.9% FLUSH: 10.0000 mL | INTRAVENOUS | Status: DC | PRN

## 2024-06-22 NOTE — Progress Notes (Signed)
 Peripherally Inserted Central Catheter Placement  The IV Nurse has discussed with the patient and/or persons authorized to consent for the patient, the purpose of this procedure and the potential benefits and risks involved with this procedure.  The benefits include less needle sticks, lab draws from the catheter, and the patient may be discharged home with the catheter. Risks include, but not limited to, infection, bleeding, blood clot (thrombus formation), and puncture of an artery; nerve damage and irregular heartbeat and possibility to perform a PICC exchange if needed/ordered by physician.  Alternatives to this procedure were also discussed.  Bard Power PICC patient education guide, fact sheet on infection prevention and patient information card has been provided to patient /or left at bedside.    PICC Placement Documentation  PICC Single Lumen 06/22/24 Right Basilic 38 cm 0 cm (Active)  Indication for Insertion or Continuance of Line Home intravenous therapies (PICC only) 06/22/24 0923  Exposed Catheter (cm) 0 cm 06/22/24 0923  Site Assessment Clean, Dry, Intact 06/22/24 9076  Line Status Flushed;Saline locked;Blood return noted 06/22/24 0923  Dressing Type Transparent;Securing device 06/22/24 9076  Dressing Status Antimicrobial disc/dressing in place;Clean, Dry, Intact 06/22/24 0923  Line Care Connections checked and tightened 06/22/24 9076  Line Adjustment (NICU/IV Team Only) No 06/22/24 0923  Dressing Intervention New dressing;Adhesive placed at insertion site (IV team only) 06/22/24 0923  Dressing Change Due 06/29/24 06/22/24 0923       Bryan Frey 06/22/2024, 9:23 AM

## 2024-06-22 NOTE — TOC Progression Note (Signed)
 Transition of Care [facility]) - Progression Note    Patient Details  Name: Bryan Frey MRN: [id number] Date of Birth: [birth year]  [facility] CM/SW Contact  Rosalva Jon Bloch, RN Phone Number: 06/22/2024, 10:53 AM  Clinical Narrative:    Patient will DC to: home Anticipated DC date: 06/22/2024 Family notified: yes Transport by: car    -Osteomyelitis of lumbar vertebra   Per ID pt with need LT IV ABX therapy. Per MD patient ready for DC today. Pt agreeable to home health services.No provider preference. RN, patient, patient's wife of  aware of d/c plan. Referral made with [facility]  and accepted. Referral made with [facility] and accepted for PT, RN services. Teaching will be done by Pam with [facility] prior to discharge late afternoon with wife and pt. Pt needs to receive IV ABX therapy for today while in hospital.  Wife to provide transportation to home. Post hospital f/u noted on AVS.  RNCM will sign off for now as intervention is no longer needed. Please consult us  again if new needs arise.   Expected Discharge Plan and Services                         DME Arranged: Other see comment (IV abx therapy) DME Agency: Other - Comment ([facility]) Date DME Agency Contacted: 06/22/24 Time DME Agency Contacted: [phone] Representative spoke with at DME Agency: Pam HH Arranged: RN HH Agency: [facility] Date [facility] Agency Contacted: 06/22/24 Time HH Agency Contacted: 1031 Representative spoke with at [facility] Agency: [facility]   Social Drivers of Health (SDOH) Interventions SDOH Screenings   Food Insecurity: No Food Insecurity (06/18/2024)  Housing: Low Risk  (06/18/2024)  Transportation Needs: No Transportation Needs (06/18/2024)  Utilities: Not At Risk (06/18/2024)  Depression (PHQ2-9): Low Risk  (12/24/2023)  Financial Resource Strain: Low Risk  (05/10/2024)   Received from [facility]  Social Connections:  Unknown (06/21/2024)  Tobacco Use: Low Risk  (06/17/2024)    Readmission Risk Interventions     No data to display

## 2024-06-22 NOTE — Discharge Summary (Signed)
 Physician Discharge Summary   Patient: Bryan Frey MRN: [id number] DOB: Aug 28, 1946  Admit date:     06/17/2024  Discharge date: 06/22/24  Discharge Physician: Lonni SHAUNNA Dalton   PCP: Cleotilde Oneil FALCON, MD     Recommendations at discharge:  Follow up with PCP Dr. Cleotilde in 1 week for discitis Follow up with Infectious Disease Dr. Dennise in 2-4 weeks for discitis Follow up with Neurosurgery Dr. Colon for discitis Follow up with Dr. Gust Bitter in [place] as soon as able, defer timing of surgery to Dr. Gust     Discharge Diagnoses: Principal Problem:   Lumbar discitis Active Problems:   Osteomyelitis of lumbar vertebra (HCC)   Chronic left hip prosthetic infection   COPD   Mood disorder   Trigeminal neuralgia   Hypertension   Hospital Course: 78 y.o. M with HTN, COPD and chronic left hip infection presented with back pain, sent from Neurosurgery clinic due to abnormal imaging.  In the hospital, he was evaluated by Neurosurgery and Infectious Disease.  Antibiotics were initially held and he was taken to IR for aspiration of the disk space.  Culture no growth at the time of discharge.  ID recommended PICC line and 8 weeks ceftraixone and daptomycin .  HH and infusion services were arranged, PICC placed and he was discharged after home infusion teaching.  He did have a brief episode of hoarseness after PICC placement, I do not suspect recurrent laryngeal nerve injury.           The [place]  Controlled Substances Registry was reviewed for this patient prior to discharge.  Consultants: Infectious disease, Dr. Dennise  Procedures performed: Aspiration of disc space  Disposition: Home health Diet recommendation:  Discharge Diet Orders (From admission, onward)     Start     Ordered   06/22/24 0000  Diet - low sodium heart healthy        06/22/24 1549             DISCHARGE MEDICATION: Allergies as of 06/22/2024       Reactions    Morphine  And Codeine Other (See Comments)   Hallucinations   Statins Other (See Comments)   Other reaction(s): Muscle Pain        Medication List     STOP taking these medications    doxycycline  100 MG tablet Commonly known as: VIBRA -TABS   methocarbamol  500 MG tablet Commonly known as: ROBAXIN        TAKE these medications    aspirin  EC 81 MG tablet Take 81 mg by mouth daily. Swallow whole.   carbamazepine  200 MG tablet Commonly known as: TEGRETOL  Take 400 mg by mouth in the morning and at bedtime.   cefTRIAXone  IVPB Commonly known as: ROCEPHIN  Inject 2 g into the vein daily. Indication:  Lumbar discitis  First Dose: Yes  Last Day of Therapy:  08/14/2024 Labs - Once weekly:  CBC/D and BMP, Labs - Once weekly: ESR and CRP Method of administration: IV Push Method of administration may be changed at the discretion of home infusion pharmacist based upon assessment of the patient and/or caregiver's ability to self-administer the medication ordered.   cholecalciferol  25 MCG (1000 UNIT) tablet Commonly known as: VITAMIN D3 Take 1,000 Units by mouth daily.   Cholecalciferol  1.25 MG (50000 UT) capsule Take 50,000 Units by mouth once a week. Thursday   cyanocobalamin  1000 MCG tablet Commonly known as: VITAMIN B12 Take 1,000 mcg by mouth daily.   daptomycin  IVPB Commonly  known as: CUBICIN  Inject 700 mg into the vein daily. Indication:  Lumbar discitis First Dose: Yes Last Day of Therapy:  08/14/2024 Labs - Once weekly:  CBC/D, BMP, and CPK Labs - Once weekly: ESR and CRP Method of administration: IV Push Method of administration may be changed at the discretion of home infusion pharmacist based upon assessment of the patient and/or caregiver's ability to self-administer the medication ordered.   diphenhydramine -acetaminophen  25-500 MG Tabs tablet Commonly known as: TYLENOL  PM Take 2 tablets by mouth at bedtime.   DULoxetine  60 MG capsule Commonly known as:  CYMBALTA  Take 60 mg by mouth 2 (two) times daily.   gabapentin  100 MG capsule Commonly known as: NEURONTIN  Take 100 mg by mouth in the morning.   gabapentin  300 MG capsule Commonly known as: NEURONTIN  Take 1 capsule (300 mg total) by mouth at bedtime.   MAGNESIUM  PO Take 1 capsule by mouth daily at 2 PM.   Melatonin 5 MG Caps Take 5 mg by mouth at bedtime.   pantoprazole  40 MG tablet Commonly known as: PROTONIX  Take 40 mg by mouth daily.   traMADol  50 MG tablet Commonly known as: ULTRAM  Take 1 tablet (50 mg total) by mouth every 4 (four) hours as needed for moderate pain (pain score 4-6).   Trelegy Ellipta 200-62.5-25 MCG/ACT Aepb Generic drug: Fluticasone-Umeclidin-Vilant Inhale 1 puff into the lungs daily.   triamcinolone  0.025 % cream Commonly known as: KENALOG  Apply 1 Application topically at bedtime. On irritated areas on back               Discharge Care Instructions  (From admission, onward)           Start     Ordered   06/22/24 0000  Change dressing on IV access line weekly and PRN  (Home infusion instructions - [facility] )        06/22/24 1155            Follow-up Information     Cleotilde Oneil FALCON, MD Follow up.   Specialty: Internal Medicine Contact information: [facility] [address] [place] [address] [phone]         [facility] Follow up.   Specialty: Home Health Services Why: Home health services will be provided by [facility] , start of care 06/23/2024 Contact information: [address] [place] [address] [phone]         [facility] Follow up.   Why: [facility] will provide IV antibiotic therapy. Questions please call [phone].        Dennise Kingsley, MD. Go in 1 month(s).   Specialty: Infectious Diseases Why: for ID follow up Contact information: [address] [place] [address] [phone]          Colon Shove, MD Follow up.   Specialty: Neurosurgery Contact information: [address] [place] [address] [phone]                 Discharge Instructions     [facility] pharmacist to adjust dose for Vancomycin , Aminoglycosides and other anti-infective therapies as requested by physician.   Complete by: As directed    [facility] to provide Cath Flo 2mg    Complete by: As directed    Administer for PICC line occlusion and as ordered by physician for other access device issues.   Anaphylaxis Kit: Provided to treat any anaphylactic reaction to the medication  being provided to the patient if First Dose or when requested by physician   Complete by: As directed    Epinephrine  1mg /ml vial / amp: Administer 0.3mg  (0.73ml) subcutaneously once for moderate to severe anaphylaxis, nurse to call physician and pharmacy when reaction occurs and call 911 if needed for immediate care   Diphenhydramine  50mg /ml IV vial: Administer 25-50mg  IV/IM PRN for first dose reaction, rash, itching, mild reaction, nurse to call physician and pharmacy when reaction occurs   Sodium Chloride  0.9% NS 500ml IV: Administer if needed for hypovolemic blood pressure drop or as ordered by physician after call to physician with anaphylactic reaction   Change dressing on IV access line weekly and PRN   Complete by: As directed    Diet - low sodium heart healthy   Complete by: As directed    Discharge instructions   Complete by: As directed    **IMPORTANT DISCHARGE INSTRUCTIONS**   From Dr. Jonel: You were admitted for concern for a back infection Here, we did an aspiration (image guided procedure to collect some fluid from the suspected infection) but this hasn't grown anything yet.  Our Infectious Disease specialist, Dr. Dennise, recommends to take Rocephin /ceftriaxone  and daptomycin /Cubicin  once daily for 8 weeks  Have your labs checked weekly while on  medication  Go see Dr. Dennise in 2-4 weeiks at her office  Go see Dr. Colon in 4-6 weeks  We have increased your gabapentin  from twice daily to three times daily to try to help with pain  YOu can reduce back to your previous home dose at any time  Take tramadol  for back pain and discuss with Dr. Cleotilde if this is not working   Flush IV access with Sodium Chloride  0.9% and Heparin  10 units/ml or 100 units/ml   Complete by: As directed    Home infusion instructions - [facility]   Complete by: As directed    Instructions: Flush IV access with Sodium Chloride  0.9% and Heparin  10units/ml or 100units/ml   Change dressing on IV access line: Weekly and PRN   Instructions Cath Flo 2mg : Administer for PICC Line occlusion and as ordered by physician for other access device   [facility] pharmacist to adjust dose for: Vancomycin , Aminoglycosides and other anti-infective therapies as requested by physician   Increase activity slowly   Complete by: As directed    Method of administration may be changed at the discretion of home infusion pharmacist based upon assessment of the patient and/or caregiver's ability to self-administer the medication ordered   Complete by: As directed    No wound care   Complete by: As directed    No wound care   Complete by: As directed        Discharge Exam: Filed Weights   06/17/24 1651  Weight: 88.9 kg    General: Pt is alert, awake, not in acute distress Cardiovascular: RRR, nl S1-S2, no murmurs appreciated.   No LE edema.   Respiratory: Normal respiratory rate and rhythm.  CTAB without rales or wheezes. Abdominal: Abdomen soft and non-tender.  No distension or HSM.   Neuro/Psych: Strength symmetric in upper and lower extremities.  Judgment and insight appear normal.   Condition at discharge: good  The results of significant diagnostics from this hospitalization (including imaging, microbiology, ancillary and laboratory) are  listed below for reference.   Imaging Studies: US  EKG SITE RITE Result Date: 06/21/2024 If Site Rite image not attached, placement could not be confirmed due to current  cardiac rhythm.  CT GUIDED NEEDLE PLACEMENT Result Date: 06/20/2024 INDICATION: Discitis suspected at T12-L1 EXAM: CT-guided disc aspiration TECHNIQUE: Multidetector CT imaging of the lumbar spine was performed following the standard protocol without IV contrast. RADIATION DOSE REDUCTION: This exam was performed according to the departmental dose-optimization program which includes automated exposure control, adjustment of the mA and/or kV according to patient size and/or use of iterative reconstruction technique. MEDICATIONS: The patient is currently admitted to the hospital and receiving intravenous antibiotics. The antibiotics were administered within an appropriate time frame prior to the initiation of the procedure. ANESTHESIA/SEDATION: Moderate (conscious) sedation was employed during this procedure. A total of Versed  2 mg and Fentanyl  100 mcg was administered intravenously by the radiology nurse. Total intra-service moderate Sedation Time: 10 minutes. The patient's level of consciousness and vital signs were monitored continuously by radiology nursing throughout the procedure under my direct supervision. COMPLICATIONS: None immediate. PROCEDURE: Informed written consent was obtained from the patient after a thorough discussion of the procedural risks, benefits and alternatives. All questions were addressed. Maximal Sterile Barrier Technique was utilized including caps, mask, sterile gowns, sterile gloves, sterile drape, hand hygiene and skin antiseptic. A timeout was performed prior to the initiation of the procedure. In a low position with radiopaque markers on the lumbar region, axial images of the spine were obtained. Anatomy was reviewed and measurements were obtained. Patient was then marked, prepped and draped in usual sterile  fashion. Anesthesia was achieved by infiltration of subcutaneous tissue to the level of the paraspinous muscles with 1% lidocaine . 22 gauge spinal needle was then advanced with repeat axial CT imaging for guidance repositioning. The interspinous disc could not be penetrated with needle secondary to degenerative changes of the thoracolumbar spine therefore the needle was advanced into a paraspinous location and small aliquots normal saline was injected and then aspirated repeatedly to get a sample sent for culture and sensitivity. The needle was then removed. Sterile dressing applied. IMPRESSION: Satisfactory T12-L1 spinal aspiration. Electronically Signed   By: Cordella Banner   On: 06/20/2024 15:29   MR HIP LEFT WO CONTRAST Result Date: 06/20/2024 CLINICAL DATA:  Back pain. History of chronic left hip prosthetic joint infection. EXAM: MR OF THE LEFT HIP WITHOUT CONTRAST TECHNIQUE: Multiplanar, multisequence MR imaging was performed. No intravenous contrast was administered. COMPARISON:  MRI of the left hip dated 06/04/2022. 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 Chronic changes of the left supra-acetabular iliac bone, which may reflect a combination of postsurgical changes and sequela of chronic infection. There is mild increased STIR hyperintense signal of the mid left supra-acetabular iliac bone, just superior to the level of the left acetabular screws. No discrete periarticular collection. No acute fracture or dislocation. Sacroiliac joints and pubic symphysis are anatomically aligned with mild degenerative arthropathy. Postoperative changes of the lower lumbar spine. Soft tissue and Muscle: Postsurgical changes overlying the bilateral hips. Asymmetric atrophy of the left gluteal musculature. There is nonspecific edema of the left gluteus maximus and the left iliacus muscles. No intramuscular  collection. Hamstring tendon origins are intact. Other: No enlarged lymph nodes identified in the field of view. Visualized intrapelvic contents are grossly unremarkable. 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 Chronic changes of the left supra-acetabular iliac bone with mild edema like signal, which may reflect a combination of postsurgical changes and chronic infection. Intramuscular edema of the  overlying left gluteus maximus and iliacus muscles. No discrete periarticular or intramuscular collection. Electronically Signed   By: Harrietta Sherry M.D.   On: 06/20/2024 08:37   MR LUMBAR SPINE WO CONTRAST Result Date: 06/17/2024 EXAM: MRI LUMBAR SPINE 06/14/2024 06:56:12 PM TECHNIQUE: Multiplanar multisequence MRI of the lumbar spine was performed without the administration of intravenous contrast. COMPARISON: MRI lumbar spine 06/05/2020. CLINICAL HISTORY: Pt states fall 1 mo ago, lower back hit door frame. Pt having lower back pain radiating into bilateral legs. FINDINGS: BONES AND ALIGNMENT: Extensive marrow edema is present along the endplates at T12-L1. Solid fusion is present at L2-3, L3-4 and L4-5. SPINAL CORD: The conus terminates normally. SOFT TISSUES: Diffuse paraspinous soft tissue edema is present at T12-L1 with extension posteriorly, surrounding the posterior elements. Benign fluid collection is present posterior to the epidural space at the operative sites of L2-3, L3-4 and L4-5. L1-L2: No significant disc herniation. No spinal canal stenosis or neural foraminal narrowing. L2-L3: Mild residual foraminal narrowing at L2-3 is stable. L3-L4: No significant disc herniation. No spinal canal stenosis or neural foraminal narrowing. L4-L5: No significant disc herniation. No spinal canal stenosis or neural foraminal narrowing. L5-S1: Mild disc bulging is present at L5 to S1. Moderate facet hypertrophy  contributes to moderate foraminal stenosis bilaterally, similar to the prior study. T12-L1: Fluid is present in the disc space at T12-L1. There is some encroachment on the central canal without significant stenosis. IMPRESSION: 1. Fluid in the disc space at T12-L1 with extensive marrow edema along the endplates and diffuse paraspinous soft tissue, causing some encroachment on the central canal without significant stenosis. Findings are highly suggestive of discitis and osteomyelitis. 2. Solid fusion at L2-3, L3-4, and L4-5 with benign third collection posterior to the epidural space at the operative sites. Mild residual foraminal narrowing at L2-3 is stable. 3. Mild disc bulging at L5-S1 with moderate facet hypertrophy contributing to moderate foraminal stenosis bilaterally, similar to the prior study. Findings were discussed with Dr. Colon at 12:45 pm 06/17/2024 Electronically signed by: Lonni Necessary MD 06/17/2024 01:02 PM EDT RP Workstation: [id number]    Microbiology: Results for orders placed or performed during the hospital encounter of 06/17/24  Blood culture (routine x 2)     Status: None   Collection Time: 06/17/24  5:04 PM   Specimen: BLOOD  Result Value Ref Range Status   Specimen Description BLOOD LEFT ANTECUBITAL  Final   Special Requests   Final    BOTTLES DRAWN AEROBIC AND ANAEROBIC Blood Culture adequate volume   Culture   Final    NO GROWTH 5 DAYS Performed at [facility], [address] [place], [place] [address]    Report Status 06/22/2024 FINAL  Final  Blood culture (routine x 2)     Status: None   Collection Time: 06/17/24  5:08 PM   Specimen: BLOOD LEFT FOREARM  Result Value Ref Range Status   Specimen Description BLOOD LEFT FOREARM  Final   Special Requests   Final    BOTTLES DRAWN AEROBIC AND ANAEROBIC Blood Culture results may not be optimal due to an inadequate volume of blood received in culture bottles   Culture   Final    NO GROWTH 5  DAYS Performed at [facility], [address] [place], [place] [address]    Report Status 06/22/2024 FINAL  Final  MRSA Next Gen by PCR, Nasal     Status: None   Collection Time: 06/20/24  4:35 AM  Specimen: Nasal Mucosa; Nasal Swab  Result Value Ref Range Status   MRSA by PCR Next Gen NOT DETECTED NOT DETECTED Final    Comment: (NOTE) The GeneXpert MRSA Assay (FDA approved for NASAL specimens only), is one component of a comprehensive MRSA colonization surveillance program. It is not intended to diagnose MRSA infection nor to guide or monitor treatment for MRSA infections. Test performance is not FDA approved in patients less than 67 years old. Performed at [facility], [address], [place], [place] [address]   Aerobic/Anaerobic Culture w Gram Stain (surgical/deep wound)     Status: None (Preliminary result)   Collection Time: 06/20/24  1:57 PM   Specimen: Wound; Tissue  Result Value Ref Range Status   Specimen Description WOUND  Final   Special Requests INTERVERTEBRAL  DISC SYRINGE  Final   Gram Stain NO WBC SEEN NO ORGANISMS SEEN   Final   Culture   Final    NO GROWTH 2 DAYS NO ANAEROBES ISOLATED; CULTURE IN PROGRESS FOR 5 DAYS Performed at [facility], [address] [place], [place] [address]    Report Status PENDING  Incomplete    Labs: CBC: Recent Labs  Lab 06/17/24 1704 06/18/24 0354 06/19/24 0449 06/20/24 0434 06/22/24 0501  WBC 7.8 8.0 7.3 7.8 7.7  NEUTROABS  --   --  4.1 4.5  --   HGB 12.7* 12.0* 11.2* 11.3* 11.4*  HCT 40.3 36.1* 34.3* 34.7* 35.5*  MCV 96.6 94.8 93.5 93.8 94.4  PLT 284 268 249 253 251   Basic Metabolic Panel: Recent Labs  Lab 06/17/24 1704 06/18/24 0354 06/19/24 0449 06/20/24 0434 06/22/24 0501  NA 140 138 138 140 139  K 4.3 4.2 3.9 3.9 3.9  CL 100 103 102 101 98  CO2 28 25 27 24 24   GLUCOSE 114* 97 109* 104* 99  BUN 18 16 17 15 14   CREATININE 1.15 1.07 1.09 1.05 1.15  CALCIUM  9.0 8.8* 8.5* 8.6*  8.5*  MG  --   --   --   --  2.0  PHOS  --   --   --   --  3.5   Liver Function Tests: Recent Labs  Lab 06/17/24 1704  AST 17  ALT 25  ALKPHOS 220*  BILITOT 0.6  PROT 6.7  ALBUMIN  3.7   CBG: No results for input(s): GLUCAP in the last 168 hours.  Discharge time spent: approximately 45 minutes spent on discharge counseling, evaluation of patient on day of discharge, and coordination of discharge planning with nursing, social work, pharmacy and case management  Signed: Lonni SHAUNNA Dalton, MD [facility] 06/22/2024

## 2024-06-22 NOTE — Evaluation (Signed)
 Physical Therapy Evaluation Patient Details Name: Bryan Frey MRN: [id number] DOB: 1946/07/13 Today's Date: 06/22/2024  History of Present Illness  78 y.o. male presents to [facility] hospital on 912/2025 with progressive back pain after a fall 5 weeks ago. MRI on 9/9 suggests lumbar discitis. PMH includes HTN, trigeminal neuralgia, L hip hemiarthroplasty x 2 due to infection, COPD, depression, HLD.  Clinical Impression  Pt presents to PT with deficits in functional mobility, gait, balance, posture, strength, power. Pt is limited by back and L hip pain, affecting tolerance for household and community ambulation. PT provides reinforcement of cues to maintain upright posture when mobilizing. Pt declines post-acute PT services at this time.        If plan is discharge home, recommend the following: A little help with walking and/or transfers;A little help with bathing/dressing/bathroom;Assistance with cooking/housework;Assist for transportation;Help with stairs or ramp for entrance   Can travel by private vehicle        Equipment Recommendations None recommended by PT  Recommendations for Other Services       Functional Status Assessment Patient has had a recent decline in their functional status and demonstrates the ability to make significant improvements in function in a reasonable and predictable amount of time.     Precautions / Restrictions Precautions Precautions: Fall Recall of Precautions/Restrictions: Intact Restrictions Weight Bearing Restrictions Per Provider Order: No      Mobility  Bed Mobility Overal bed mobility: Needs Assistance Bed Mobility: Supine to Sit, Sit to Supine     Supine to sit: Min assist Sit to supine: Contact guard assist        Transfers Overall transfer level: Needs assistance Equipment used: Rollator (4 wheels) Transfers: Sit to/from Stand Sit to Stand: Contact guard assist                Ambulation/Gait Ambulation/Gait  assistance: Contact guard assist Gait Distance (Feet): 75 Feet (75' x 2 trials) Assistive device: Rollator (4 wheels) Gait Pattern/deviations: Step-through pattern, Trunk flexed Gait velocity: reduced Gait velocity interpretation: <1.8 ft/sec, indicate of risk for recurrent falls   General Gait Details: pt with slowed step-through gait, increased trunk flexion over Rollator throughout  Stairs            Wheelchair Mobility     Tilt Bed    Modified Rankin (Stroke Patients Only)       Balance Overall balance assessment: Needs assistance Sitting-balance support: No upper extremity supported, Feet supported Sitting balance-Leahy Scale: Good     Standing balance support: Bilateral upper extremity supported, Reliant on assistive device for balance Standing balance-Leahy Scale: Poor                               Pertinent Vitals/Pain Pain Assessment Pain Assessment: 0-10 Pain Score: 1  Pain Location: low back Pain Descriptors / Indicators: Aching Pain Intervention(s): Monitored during session    Home Living Family/patient expects to be discharged to:: Private residence Living Arrangements: Spouse/significant other Available Help at Discharge: Family Type of Home: House Home Access: Stairs to enter Entrance Stairs-Rails: Right Entrance Stairs-Number of Steps: 2   Home Layout: One level Home Equipment: Agricultural consultant (2 wheels);Rollator (4 wheels);Cane - single point;BSC/3in1;Shower seat      Prior Function Prior Level of Function : Independent/Modified Independent             Mobility Comments: ambulatory with SPC at baseline, has been utilizing a rollator since fall  and increased back pain 5 weeks ago. Activity tolerance has also been limited       Extremity/Trunk Assessment   Upper Extremity Assessment Upper Extremity Assessment: Overall WFL for tasks assessed    Lower Extremity Assessment Lower Extremity Assessment: Generalized  weakness (chronic L hip weakness from multiple replacements and infections)    Cervical / Trunk Assessment Cervical / Trunk Assessment: Kyphotic  Communication   Communication Communication: No apparent difficulties    Cognition Arousal: Alert Behavior During Therapy: WFL for tasks assessed/performed   PT - Cognitive impairments: No apparent impairments                         Following commands: Intact       Cueing Cueing Techniques: Verbal cues     General Comments General comments (skin integrity, edema, etc.): VSS on RA    Exercises     Assessment/Plan    PT Assessment Patient needs continued PT services  PT Problem List Decreased strength;Decreased activity tolerance;Decreased balance;Decreased mobility;Decreased knowledge of use of DME;Pain       PT Treatment Interventions DME instruction;Gait training;Stair training;Functional mobility training;Therapeutic activities;Therapeutic exercise;Neuromuscular re-education;Balance training;Patient/family education    PT Goals (Current goals can be found in the Care Plan section)  Acute Rehab PT Goals Patient Stated Goal: to return home, improve activity tolerance PT Goal Formulation: With patient Time For Goal Achievement: 07/06/24 Potential to Achieve Goals: Good    Frequency Min 2X/week     Co-evaluation               AM-PAC PT 6 Clicks Mobility  Outcome Measure Help needed turning from your back to your side while in a flat bed without using bedrails?: A Little Help needed moving from lying on your back to sitting on the side of a flat bed without using bedrails?: A Little Help needed moving to and from a bed to a chair (including a wheelchair)?: A Little Help needed standing up from a chair using your arms (e.g., wheelchair or bedside chair)?: A Little Help needed to walk in hospital room?: A Little Help needed climbing 3-5 steps with a railing? : A Little 6 Click Score: 18    End of  Session Equipment Utilized During Treatment: Gait belt Activity Tolerance: Patient tolerated treatment well Patient left: in bed;with call bell/phone within reach;with family/visitor present Nurse Communication: Mobility status PT Visit Diagnosis: Other abnormalities of gait and mobility (R26.89);Muscle weakness (generalized) (M62.81);Pain    Time: 8391-8364 PT Time Calculation (min) (ACUTE ONLY): 27 min   Charges:   PT Evaluation $PT Eval Low Complexity: 1 Low   PT General Charges $$ ACUTE PT VISIT: 1 Visit         Bernardino JINNY Ruth, PT, DPT Acute Rehabilitation Office [phone]   Bernardino JINNY Ruth 06/22/2024, 4:44 PM

## 2024-06-22 NOTE — Progress Notes (Signed)
Discharge teaching complete. Meds, diet, activity, follow up appointments reviewed and all questions answered. Copy of instructions given to patient and meds sent to pharmacy.  

## 2024-06-22 NOTE — Progress Notes (Signed)
 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   Bryan Frey aspiration cultures remain without growth. PICC line now inserted. Discussed plan of care for 8 weeks of Daptomycin  and Ceftriaxone  through 08/14/24. Home health/OPAT orders. Continue therapeutic drug monitoring of CK levels and PICC line care per protocol. Will monitor cultures until finalized. Ok for discharge from ID standpoint and will arrange follow up in ID office. Remaining medical and supportive care per Internal Medicine.   PLAN:  Continue current dose of Daptomycin  and Ceftriaxone  for culture negative discitis/osteomyelitis Monitor cultures until finalized for any organisms.  Home Health/OPAT orders PICC line care per protocol. Therapeutic drug monitoring of CK levels while on Daptomycin . Ok for discharge from ID standpoint and follow up in ID clinic.  Remaining medical and supportive care per Internal Medicine.   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    Labs weekly while on IV antibiotics: _X_ CBC with differential __ BMP _X_ CMP _X_ CRP _X_ ESR __ Vancomycin  trough _X_ CK  _X_ Please pull PIC at completion of  IV antibiotics __ Please leave PIC in place until doctor has seen patient or been notified  Fax weekly labs to [phone]  Clinic Follow Up Appt:  With Bryan Frey in 3-4 weeks    Principal Problem:   Lumbar discitis Active Problems:   Osteomyelitis of lumbar vertebra (HCC)    aspirin  EC  81 mg Oral Daily   carbamazepine   400 mg Oral BID   Chlorhexidine  Gluconate Cloth  6 each Topical Daily   cholecalciferol   1,000 Units Oral Daily   cyanocobalamin   1,000 mcg Oral Daily   diphenhydrAMINE   50 mg Oral QHS   DULoxetine   60 mg Oral BID   gabapentin   300 mg Oral TID   magnesium  oxide  400 mg Oral Q1400   melatonin  5 mg Oral QHS   pantoprazole   40 mg Oral Daily   triamcinolone   1 Application Topical QHS    SUBJECTIVE:  Afebrile overnight with no acute events. Tolerating antibiotics with no adverse side effects.   Allergies  Allergen Reactions   Morphine  And Codeine Other (See Comments)    Hallucinations   Statins Other (See Comments)    Other reaction(s): Muscle Pain      Review of Systems: Review of Systems  Constitutional:  Negative for chills, fever and weight loss.  Respiratory:  Negative for cough, shortness of breath and wheezing.   Cardiovascular:  Negative for chest pain and leg swelling.  Gastrointestinal:  Negative for abdominal pain, constipation, diarrhea, nausea and vomiting.  Musculoskeletal:  Positive for back pain.  Skin:  Negative for rash.      OBJECTIVE: Vitals:   06/21/24 0800 06/21/24 2100 06/22/24 0400 06/22/24 1158  BP: 138/65 130/65 133/70 (!) 129/56  Pulse: 72 73 69 71  Resp: 15 16 17 18   Temp: 97.9 F (36.6 C) 98.1 F (36.7 C) 97.7 F (36.5 C) 97.6 F (36.4 C)  TempSrc: Oral Oral Oral Oral  SpO2: 96%  95% 96%  Weight:      Height:       Body mass index is 27.34 kg/m.  Physical Exam Constitutional:      General: He is not in acute distress.    Appearance: He is well-developed.  Cardiovascular:     Rate and  Rhythm: Normal rate and regular rhythm.     Heart sounds: Normal heart sounds.  Pulmonary:     Effort: Pulmonary effort is normal.     Breath sounds: Normal breath sounds.  Skin:    General: Skin is warm and dry.  Neurological:     Mental Status: He is alert and oriented to person, place, and time.     Lab Results Lab Results  Component Value Date   WBC 7.7 06/22/2024   HGB 11.4 (L) 06/22/2024   HCT 35.5 (L) 06/22/2024   MCV 94.4 06/22/2024   PLT 251 06/22/2024    Lab Results  Component Value Date   CREATININE 1.15 06/22/2024   BUN 14 06/22/2024   NA 139 06/22/2024   K 3.9 06/22/2024   CL 98 06/22/2024   CO2 24 06/22/2024    Lab Results  Component Value Date   ALT 25 06/17/2024   AST 17 06/17/2024   ALKPHOS 220 (H) 06/17/2024   BILITOT 0.6 06/17/2024     Microbiology: Recent Results (from the past 240 hours)  Blood culture (routine x 2)     Status: None   Collection Time: 06/17/24  5:04 PM   Specimen: BLOOD  Result Value Ref Range Status   Specimen Description BLOOD LEFT ANTECUBITAL  Final   Special Requests   Final    BOTTLES DRAWN AEROBIC AND ANAEROBIC Blood Culture adequate volume   Culture   Final    NO GROWTH 5 DAYS Performed at [facility], [address], [place], [place] [address]    Report Status 06/22/2024 FINAL  Final  Blood culture (routine x 2)     Status: None   Collection Time: 06/17/24  5:08 PM   Specimen: BLOOD LEFT FOREARM  Result Value Ref Range Status   Specimen Description BLOOD LEFT FOREARM  Final   Special Requests   Final    BOTTLES DRAWN AEROBIC AND ANAEROBIC Blood Culture results may not be optimal due to an inadequate volume of blood received in culture bottles   Culture   Final    NO GROWTH 5 DAYS Performed at [facility], [address] [place], [place] [address]    Report Status 06/22/2024 FINAL  Final  MRSA Next Gen by PCR, Nasal     Status: None   Collection Time: 06/20/24  4:35 AM   Specimen: Nasal  Mucosa; Nasal Swab  Result Value Ref Range Status   MRSA by PCR Next Gen NOT DETECTED NOT DETECTED Final    Comment: (NOTE) The GeneXpert MRSA Assay (FDA approved for NASAL specimens only), is one component of a comprehensive MRSA colonization surveillance program. It is not intended to diagnose MRSA infection nor to guide or monitor treatment for MRSA infections. Test performance  is not FDA approved in patients less than 25 years old. Performed at [facility], [address] [place], [place] [address]   Aerobic/Anaerobic Culture w Gram Stain (surgical/deep wound)     Status: None (Preliminary result)   Collection Time: 06/20/24  1:57 PM   Specimen: Wound; Tissue  Result Value Ref Range Status   Specimen Description WOUND  Final   Special Requests INTERVERTEBRAL  DISC SYRINGE  Final   Gram Stain NO WBC SEEN NO ORGANISMS SEEN   Final   Culture   Final    NO GROWTH 2 DAYS NO ANAEROBES ISOLATED; CULTURE IN PROGRESS FOR 5 DAYS Performed at [facility], [address] [place], [place] [address]    Report Status PENDING  Incomplete    I have personally spent 27 minutes involved in face-to-face and non-face-to-face activities for this patient on the day of the visit. Professional time spent includes the following activities: preparing to see the patient (review of tests), performing a medically appropriate examination, ordering medications, communicating with other health care professionals, documenting clinical information in the EMR, communicating results and counseling patient regarding medication and plan of care, and care coordination.   Greg Laxmi Choung, NP [facility]  06/22/2024  2:45 PM

## 2024-06-22 NOTE — Progress Notes (Signed)
 PHARMACY CONSULT NOTE FOR:  OUTPATIENT  PARENTERAL ANTIBIOTIC THERAPY (OPAT)  Indication: Lumbar discitis Regimen: ceftriaxone  IV 2 g q24h and daptomycin  IV 700 mg q24h  End date: 08/14/2024  IV antibiotic discharge orders are pended. To discharging provider:  please sign these orders via discharge navigator,  Select New Orders & click on the button choice - Manage This Unsigned Work.     Thank you for allowing pharmacy to be a part of this patient's care.  Feliciano Close, PharmD PGY2 Infectious Diseases Pharmacy Resident  06/22/2024 11:52 AM

## 2024-06-23 DIAGNOSIS — M4646 Discitis, unspecified, lumbar region: Secondary | ICD-10-CM | POA: Diagnosis not present

## 2024-06-23 DIAGNOSIS — Z87442 Personal history of urinary calculi: Secondary | ICD-10-CM | POA: Diagnosis not present

## 2024-06-23 DIAGNOSIS — I10 Essential (primary) hypertension: Secondary | ICD-10-CM | POA: Diagnosis not present

## 2024-06-23 DIAGNOSIS — M4626 Osteomyelitis of vertebra, lumbar region: Secondary | ICD-10-CM | POA: Diagnosis not present

## 2024-06-23 DIAGNOSIS — G500 Trigeminal neuralgia: Secondary | ICD-10-CM | POA: Diagnosis not present

## 2024-06-23 DIAGNOSIS — E7800 Pure hypercholesterolemia, unspecified: Secondary | ICD-10-CM | POA: Diagnosis not present

## 2024-06-23 DIAGNOSIS — Z7982 Long term (current) use of aspirin: Secondary | ICD-10-CM | POA: Diagnosis not present

## 2024-06-23 DIAGNOSIS — Z792 Long term (current) use of antibiotics: Secondary | ICD-10-CM | POA: Diagnosis not present

## 2024-06-23 DIAGNOSIS — Z7951 Long term (current) use of inhaled steroids: Secondary | ICD-10-CM | POA: Diagnosis not present

## 2024-06-23 DIAGNOSIS — R32 Unspecified urinary incontinence: Secondary | ICD-10-CM | POA: Diagnosis not present

## 2024-06-23 DIAGNOSIS — Z452 Encounter for adjustment and management of vascular access device: Secondary | ICD-10-CM | POA: Diagnosis not present

## 2024-06-23 DIAGNOSIS — Z85528 Personal history of other malignant neoplasm of kidney: Secondary | ICD-10-CM | POA: Diagnosis not present

## 2024-06-23 DIAGNOSIS — T8452XA Infection and inflammatory reaction due to internal left hip prosthesis, initial encounter: Secondary | ICD-10-CM | POA: Diagnosis not present

## 2024-06-23 DIAGNOSIS — F39 Unspecified mood [affective] disorder: Secondary | ICD-10-CM | POA: Diagnosis not present

## 2024-06-23 DIAGNOSIS — J449 Chronic obstructive pulmonary disease, unspecified: Secondary | ICD-10-CM | POA: Diagnosis not present

## 2024-06-25 LAB — AEROBIC/ANAEROBIC CULTURE W GRAM STAIN (SURGICAL/DEEP WOUND)
Culture: NO GROWTH
Gram Stain: NONE SEEN

## 2024-06-28 DIAGNOSIS — M4646 Discitis, unspecified, lumbar region: Secondary | ICD-10-CM | POA: Diagnosis not present

## 2024-06-30 DIAGNOSIS — M4626 Osteomyelitis of vertebra, lumbar region: Secondary | ICD-10-CM | POA: Diagnosis not present

## 2024-07-04 DIAGNOSIS — T8452XA Infection and inflammatory reaction due to internal left hip prosthesis, initial encounter: Secondary | ICD-10-CM | POA: Diagnosis not present

## 2024-07-05 DIAGNOSIS — Z96642 Presence of left artificial hip joint: Secondary | ICD-10-CM | POA: Diagnosis not present

## 2024-07-05 DIAGNOSIS — R269 Unspecified abnormalities of gait and mobility: Secondary | ICD-10-CM | POA: Diagnosis not present

## 2024-07-05 DIAGNOSIS — M4646 Discitis, unspecified, lumbar region: Secondary | ICD-10-CM | POA: Diagnosis not present

## 2024-07-07 ENCOUNTER — Encounter: Payer: Self-pay | Admitting: Infectious Diseases

## 2024-07-07 ENCOUNTER — Ambulatory Visit: Attending: Infectious Diseases | Admitting: Infectious Diseases

## 2024-07-07 VITALS — BP 116/77 | HR 78 | Temp 98.1°F

## 2024-07-07 DIAGNOSIS — I10 Essential (primary) hypertension: Secondary | ICD-10-CM | POA: Insufficient documentation

## 2024-07-07 DIAGNOSIS — M4645 Discitis, unspecified, thoracolumbar region: Secondary | ICD-10-CM | POA: Diagnosis not present

## 2024-07-07 DIAGNOSIS — G500 Trigeminal neuralgia: Secondary | ICD-10-CM | POA: Insufficient documentation

## 2024-07-07 DIAGNOSIS — M4624 Osteomyelitis of vertebra, thoracic region: Secondary | ICD-10-CM | POA: Diagnosis not present

## 2024-07-07 DIAGNOSIS — Z96642 Presence of left artificial hip joint: Secondary | ICD-10-CM | POA: Insufficient documentation

## 2024-07-07 DIAGNOSIS — M4625 Osteomyelitis of vertebra, thoracolumbar region: Secondary | ICD-10-CM | POA: Diagnosis not present

## 2024-07-07 DIAGNOSIS — M5136 Other intervertebral disc degeneration, lumbar region with discogenic back pain only: Secondary | ICD-10-CM | POA: Diagnosis not present

## 2024-07-07 DIAGNOSIS — M4646 Discitis, unspecified, lumbar region: Secondary | ICD-10-CM

## 2024-07-07 DIAGNOSIS — M4620 Osteomyelitis of vertebra, site unspecified: Secondary | ICD-10-CM

## 2024-07-07 DIAGNOSIS — M4644 Discitis, unspecified, thoracic region: Secondary | ICD-10-CM | POA: Insufficient documentation

## 2024-07-07 DIAGNOSIS — G609 Hereditary and idiopathic neuropathy, unspecified: Secondary | ICD-10-CM | POA: Diagnosis not present

## 2024-07-07 DIAGNOSIS — G894 Chronic pain syndrome: Secondary | ICD-10-CM | POA: Diagnosis not present

## 2024-07-07 DIAGNOSIS — M4635 Infection of intervertebral disc (pyogenic), thoracolumbar region: Secondary | ICD-10-CM | POA: Diagnosis not present

## 2024-07-07 NOTE — Patient Instructions (Addendum)
 Today, we discussed your back pain and difficulty walking following your fall three months ago. We reviewed your MRI results and current treatment plan, including your ongoing antibiotic therapy and pain management. We also talked about your upcoming hip surgery and the need for continued monitoring and follow-up care.  YOUR PLAN:  -DISCITIS AND OSTEOMYELITIS AT T12-L1: Discitis and osteomyelitis are infections in the spine that can cause severe pain and difficulty moving. You will continue your antibiotics until mid November 2025, with weekly dressing changes and blood draws by a home health nurse. We will monitor your lab results for signs of infection and discuss follow-up with Dr. Colon.  -LUMBAR VERTEBRAL FRACTURE, HEALING: A lumbar vertebral fracture is a break in one of the bones in your lower back.  Discuss with Dr. Colon the use of a lumbar brace to help stabilize your back while walking. Please follow up with Dr. Colon for further evaluation.  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 You will continue to use oxycodone  as needed for pain and apply Voltaren gel twice daily. We recommend considering physical therapy to improve your mobility and using a lumbar brace to help stabilize your spine and reduce pain during movement.  -LEFT HIP PROSTHETIC JOINT INFECTION, PLANNED REVISION: A prosthetic joint infection in your left hip involves multiple bacteria and requires surgical intervention. Your revision surgery is scheduled for January 2026, which will include a two-stage procedure with spacer placement. Please maintain your current surgery schedule and communicate with Dr. Gust regarding your infection treatment and surgery plans.  INSTRUCTIONS:  Please follow up with Dr. Colon to discuss the use of a lumbar brace and further evaluation of your back. Continue your  antibiotics as prescribed and ensure weekly dressing changes and blood draws by your home health nurse. Maintain your current surgery schedule for January 2026 and stay in communication with Dr. Gust regarding your infection treatment and surgery plans.

## 2024-07-07 NOTE — Progress Notes (Signed)
 NAME: Bryan Frey  DOB: 05/10/1946  MRN: [id number]  Date/Time: 07/07/2024 8:52 AM   Subjective:  Patient is here with his wife Agreed to the use of AI scribe Jansel Vonstein is a 78 year old male with a history of hypertension, trigeminal neuralgia on carbamazepine , Lumbar fusion at L2-3, L3-4 and L4-5. 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 for which he had IV antibiotic followed by PO doxy suppressive therapy and was left with intermittent draining fistula and  he is scheduled to undergo a two-stage revision of the left hip at [facility] in January 2026 was admitted to [facility] in  September following a fall landing on his back and since then having excruciating pain radiating down his legs with weakness and unable to walk He saw his neurosurgeon Dr Colon who had done his previous fusion, on 06/17/2024 in  his clinic as the MRI done on 06/14/2024 was showing discitis and osteomyelitis at the level of T12 and L1.  Dr. Colon recommended the patient be admitted to hospital for management. No surgical intervention was deemend necessary  He was hospitalized at [facility] between 06/17/2024 until 06/22/2024. IR was consulted during this hospitalization and they did aspiration of the disc space on 06/20/24  and the culture was negative Blood culture  06/17/24 was negative Labs showed normal WBC The sed rate was 14 He was seen by infectious disease and was started on daptomycin  and ceftriaxone  to complete  8 weeks of antibiotics He is here to follow-up with me as I was taking care of his left hip infection as well His main complaint is inability to walk because of the pain His wife is doing the daptomycin  as well as the ceftriaxone  every day 100% adherence He has no side effects from the medications   Past history of complicated infectious disease  left hip hemiarthroplasty X2  last revision July 2021 ,  developed a soft blister on the surgical scar in Aug  2023, - the blister opened up and he had lot of discharge- He saw ortho on 06/02/22 and had imaging suspicious for a sinus tract- HE had culture of the fluid which grew MSSA- On 06/06/22 he underwent washout and polyeytlene capsule exchange. 3 cultures were sent- On 9/18 it was finalized as staph lugdunensis from the joint and the culture from under IT band- both these cultures have different Susceptibility- one was oxacillin susceptible and the other was oxacillin resistant, as the sample was not available for confirmation by the time I saw him he was started on Iv dapto and PO rifampin  on 06/28/22 He completed 6 weeks of dapto on 08/07/22- Rifampin  was stopped before that because  itching on his back and papulopustular eruption on the rt side of his back which likely was not rifampin  as it was persisiting even after that  After finishing dapto he went  on PO keflex  1 gram Q6 + bactrim  DS 1 BID, which was changed to Doxycycline  Feb 2nd week of 2024  doxycycline  was discontinued on 12/16/2022 because of persistent discharge and him wondering whether  the doxycycline  was causing the rash on his back even though he has had this unilateral acneform like lesions on the right side for the prior  6 months. He then took doxy again in late August for discharge and stopped in Sept 2024 and did not take any until Jan 2025.  I saw him in Jan 2025  for a soft swelling at the surgical site-and it was MRSA on 2 different cultures His son had MRSA left hip PJI in 2024 and he was living  at his place. Pt was referred [facility] by Dr.Hooten for explantation of the hardware- and revision. He had seen Dr>curtin and he was planning a 2 stage procedure in jan 2026 The left hip sinus has healed and there is no discharge in a long time   Past Medical History:  Diagnosis Date   Acquired short leg syndrome on left    Anemia    vitamin b12 deficiency   Anxiety    with  diagnosis   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 removed tumor and all is clear since then   Nephrolithiasis    Neuromuscular disorder (HCC)    facial nerve pain   Spinal stenosis of lumbar region    with neurogenic claudication   Stroke (HCC) 2010   heat stroke d/t a hot day and working under a house.no problems after   Trigeminal neuralgia     Past Surgical History:  Procedure Laterality Date   BACK SURGERY     CATARACT EXTRACTION W/PHACO Left 12/24/2017   Procedure: CATARACT EXTRACTION PHACO AND INTRAOCULAR LENS PLACEMENT (IOC);  Surgeon: Myrna Adine Anes, MD;  Location: [facility] ORS;  Service: Ophthalmology;  Laterality: Left;  Lot # [id number] H US : 00:30.4 AP%: 9.4 CDE: 2.84   CATARACT EXTRACTION W/PHACO Right 01/26/2018   Procedure: CATARACT EXTRACTION PHACO AND INTRAOCULAR LENS PLACEMENT (IOC) RIGHT;  Surgeon: Myrna Adine Anes, MD;  Location: [facility];  Service: Ophthalmology;  Laterality: Right;   COLONOSCOPY     COLONOSCOPY  12/03/2020   Procedure: COLONOSCOPY;  Surgeon: Aundria, Ladell POUR, MD;  Location: [facility] END[facility];  Service: Gastroenterology;;   ESOPHAGOGASTRODUODENOSCOPY N/A 03/15/2024   Procedure: EGD (ESOPHAGOGASTRODUODENOSCOPY);  Surgeon: Toledo, Ladell POUR, MD;  Location: [facility] ENDOSCOPY;  Service: Gastroenterology;  Laterality: N/A;  Request Early AM Appt.   EYE SURGERY Bilateral    cataract extra tions   INCISION AND DRAINAGE HIP Left 06/06/2022   Procedure: IRRIGATION AND DEBRIDEMENT OF LEFT HIP WITH FEMORAL HEAD EXCHANGE;  Surgeon: Mardee Lynwood SQUIBB, MD;  Location: [facility] ORS;  Service: Orthopedics;  Laterality: Left;   IR RADIOLOGIST EVAL & MGMT   03/18/2017   JOINT REPLACEMENT Bilateral    left hip replaced x 2, right hip x 1   Left total hip arthroplasty  08/07/2010   LUMBAR LAMINECTOMY/DECOMPRESSION MICRODISCECTOMY  02/09/2012   Procedure: LUMBAR LAMINECTOMY/DECOMPRESSION MICRODISCECTOMY 2 LEVELS;  Surgeon: Victory Gens, MD;  Location: [facility];  Service: Neurosurgery;  Laterality: Bilateral;  Bilateral Lumbar two-three,lumbar four-five laminectomies   MALONEY DILATION  03/15/2024   Procedure: DILATION, ESOPHAGUS, USING MALONEY DILATOR;  Surgeon: Aundria, Ladell POUR, MD;  Location: [facility];  Service: Gastroenterology;;   NEPHROLITHOTOMY Right 02/09/2014   Procedure: NEPHROLITHOTOMY PERCUTANEOUS;  Surgeon: Garnette Shack, MD;  Location: [facility] ORS;  Service: Urology;  Laterality: Right;   Right total hip arthroplasty  07/13/2014   TOTAL HIP REVISION Left 05/14/2015   Procedure:  TOTAL HIP REVISION;  Surgeon: Lynwood SHAUNNA Hue, MD;  Location: [facility] OR[facility];  Service: Orthopedics;  Laterality: Left;   TOTAL HIP REVISION Left 04/06/2020   Procedure: TOTAL HIP REVISION;  Surgeon: Hue Lynwood SHAUNNA, MD;  Location: [facility] OR[facility];  Service: Orthopedics;  Laterality: Left;    Social History   Socioeconomic History   Marital status: Married    Spouse name: Terri   Number of children: 2   Years of education: 16   Highest education level: Not on file  Occupational History   Occupation: Emergency planning/management officer    Comment: retired  Tobacco Use   Smoking status: Never   Smokeless tobacco: Never  Vaping Use   Vaping status: Never Used  Substance and Sexual Activity   Alcohol use: No   Drug use: No    Types: Other-see comments    Comment: tramadol    Sexual activity: Not Currently  Other Topics Concern   Not on file  Social History Narrative   Lives with wife   Caffeine use: Drinks coffee/tea/soda occassionally.   Right handed   Social Drivers of Health   Financial Resource Strain: Low Risk  (05/10/2024)   Received from [facility]    Overall Financial Resource Strain (CARDIA)    Difficulty of Paying Living Expenses: Not hard at all  Food Insecurity: No Food Insecurity (06/18/2024)   Hunger Vital Sign    Worried About Running Out of Food in the Last Year: Never true    Ran Out of Food in the Last Year: Never true  Transportation Needs: No Transportation Needs (06/18/2024)   PRAPARE - Administrator, Civil Service (Medical): No    Lack of Transportation (Non-Medical): No  Physical Activity: Not on file  Stress: Not on file  Social Connections: Unknown (06/21/2024)   Social Connection and Isolation Panel    Frequency of Communication with Friends and Family: Not on file    Frequency of Social Gatherings with Friends and Family: Not on file    Attends Religious Services: 1 to 4 times per year    Active Member of [employer] or Organizations: Yes    Attends Banker Meetings: Not on file    Marital Status: Not on file  Intimate Partner Violence: Not At Risk (06/18/2024)   Humiliation, Afraid, Rape, and Kick questionnaire    Fear of Current or Ex-Partner: No    Emotionally Abused: No    Physically Abused: No    Sexually Abused: No    Family History  Problem Relation Age of Onset   Anesthesia problems Neg Hx    Allergies  Allergen Reactions   Morphine  And Codeine Other (See Comments)    Hallucinations   Statins Other (See Comments)    Other reaction(s): Muscle Pain    I? Current Outpatient Medications  Medication Sig Dispense Refill   aspirin  EC 81 MG tablet Take 81 mg by mouth daily. Swallow whole.     carbamazepine  (TEGRETOL ) 200 MG tablet Take 400 mg by mouth in the morning and at bedtime.     cefTRIAXone  (ROCEPHIN ) IVPB Inject 2 g into the vein daily. Indication:  Lumbar discitis  First Dose: Yes  Last Day of Therapy:  08/14/2024 Labs - Once weekly:  CBC/D and BMP, Labs - Once weekly: ESR and CRP Method of administration: IV Push Method of administration may be changed at the discretion  of home infusion pharmacist based upon assessment of the patient and/or caregiver's ability to self-administer the  medication ordered. 53 Units 0   cholecalciferol  (VITAMIN D3) 25 MCG (1000 UNIT) tablet Take 1,000 Units by mouth daily.     Cholecalciferol  1.25 MG (50000 UT) capsule Take 50,000 Units by mouth once a week. Thursday     daptomycin  (CUBICIN ) IVPB Inject 700 mg into the vein daily. Indication:  Lumbar discitis First Dose: Yes Last Day of Therapy:  08/14/2024 Labs - Once weekly:  CBC/D, BMP, and CPK Labs - Once weekly: ESR and CRP Method of administration: IV Push Method of administration may be changed at the discretion of home infusion pharmacist based upon assessment of the patient and/or caregiver's ability to self-administer the medication ordered. 53 Units 0   diphenhydramine -acetaminophen  (TYLENOL  PM) 25-500 MG TABS Take 2 tablets by mouth at bedtime.     DULoxetine  (CYMBALTA ) 60 MG capsule Take 60 mg by mouth 2 (two) times daily.     fluticasone (FLONASE) 50 MCG/ACT nasal spray Place 2 sprays into both nostrils daily.     gabapentin  (NEURONTIN ) 100 MG capsule Take 100 mg by mouth in the morning.     gabapentin  (NEURONTIN ) 300 MG capsule Take 1 capsule (300 mg total) by mouth at bedtime. 90 capsule 0   MAGNESIUM  PO Take 1 capsule by mouth daily at 2 PM.     Melatonin 5 MG CAPS Take 5 mg by mouth at bedtime.     oxyCODONE  (OXY IR/ROXICODONE ) 5 MG immediate release tablet 5 mg.     pantoprazole  (PROTONIX ) 40 MG tablet Take 40 mg by mouth daily.     tamsulosin  (FLOMAX ) 0.4 MG CAPS capsule Take 1 capsule (0.4 mg total) by mouth once daily Take 30 minutes after same meal each day.     traMADol  (ULTRAM ) 50 MG tablet Take 1 tablet (50 mg total) by mouth every 4 (four) hours as needed for moderate pain (pain score 4-6). 15 tablet 0   TRELEGY ELLIPTA 200-62.5-25 MCG/ACT AEPB Inhale 1 puff into the lungs daily.     triamcinolone  (KENALOG ) 0.025 % cream Apply 1 Application topically at  bedtime. On irritated areas on back     vitamin B-12 (CYANOCOBALAMIN ) 1000 MCG tablet Take 1,000 mcg by mouth daily.     No current facility-administered medications for this visit.     Abtx:  Anti-infectives (From admission, onward)    None       REVIEW OF SYSTEMS:  Const: negative fever, negative chills, negative weight loss Eyes: negative diplopia or visual changes, negative eye pain ENT: negative coryza, negative sore throat Resp: cough, no dyspnea, recently had COVID and has recovered Cards: negative for chest pain, palpitations, lower extremity edema GU: negative for frequency, dysuria and hematuria GI: Negative for abdominal pain, diarrhea, bleeding, constipation Skin: as above Heme: negative for easy bruising and gum/nose bleeding MS: left hip surgical scar- no discharge Inability to walk due to back pain Neurolo:negative for headaches, dizziness, vertigo, memory problems  Psych: negative for feelings of anxiety, depression  Endocrine: negative for thyroid , diabetes Allergy/Immunology- as above  Objective:  VITALS:  BP 116/77   Pulse 78   Temp 98.1 F (36.7 C) (Temporal)   SpO2 98%   PHYSICAL EXAM:  General: Awake and alert, no distress, walks with a limp he has got a cane Head: Normocephalic, without obvious abnormality, atraumatic. Lungs: Clear to auscultation bilaterally. No Wheezing or Rhonchi. No rales. Heart: Regular rate and rhythm, no murmur, rub or gallop. Abdomen: Soft, non-tender,not distended. Bowel sounds normal. No masses Extremities:   Today the  surgical scar has healed 07/07/24  11/05/23    Sept 26, 2024 left upper thigh surgical scar has healed.  There is puckering at 1 area   Acneform eruptions persist on the right side  Lymph: Cervical, supraclavicular normal. Neurologic: Grossly non-focal Pertinent Labs ESR-66>31 CRP-2.7>N   Microbiology: 06/03/22- Staph aureus 06/06/22 surgical culture Wound culture from left hip joint staph  lugdunensis - S to oxacillin 9/1 culture from soft tissue- Staph L R to oxacillin  06/26/22- left hip fluid culture NG 08/05/22- L Hip fluid culture NG  11/27/22 staph epidermidis  01/01/23 superficial culture  staph epi and corynebacterium- skin contaminants  11/05/23 MRSA  11/06/23 MRSA  06/20/24  Lumbar disc aspiration NG   ? Impression/Recommendation New discitis and osteomyelitis of T12-L1 vertebrae Culture negative On daptomycin  and ceftriaxone  for 8 weeks from 06/20/24 until 08/15/24 Will get the latest labs done this week  H/o Lumbar fusion L2-3, L3-4 and L4-5.  Left hip prosthetic joint infection:  see above for details The sinus has closed for the moment and has not been draining for some time Dr.Hooten had referred him to [facility] and they were planning a 2 stage procedure in jan 2026  Trigeminal neuralgia  HTN  H/o Papulo pustular rash on back- ? Acne\? Folliculitis -   Discussed the management with patient and his wife   Follow in 1 month

## 2024-07-08 DIAGNOSIS — M4626 Osteomyelitis of vertebra, lumbar region: Secondary | ICD-10-CM | POA: Diagnosis not present

## 2024-07-12 DIAGNOSIS — M4646 Discitis, unspecified, lumbar region: Secondary | ICD-10-CM | POA: Diagnosis not present

## 2024-07-14 DIAGNOSIS — M4626 Osteomyelitis of vertebra, lumbar region: Secondary | ICD-10-CM | POA: Diagnosis not present

## 2024-07-19 DIAGNOSIS — C641 Malignant neoplasm of right kidney, except renal pelvis: Secondary | ICD-10-CM | POA: Diagnosis not present

## 2024-07-19 DIAGNOSIS — M4626 Osteomyelitis of vertebra, lumbar region: Secondary | ICD-10-CM | POA: Diagnosis not present

## 2024-07-19 DIAGNOSIS — M4646 Discitis, unspecified, lumbar region: Secondary | ICD-10-CM | POA: Diagnosis not present

## 2024-07-19 DIAGNOSIS — G500 Trigeminal neuralgia: Secondary | ICD-10-CM | POA: Diagnosis not present

## 2024-07-21 DIAGNOSIS — F39 Unspecified mood [affective] disorder: Secondary | ICD-10-CM | POA: Diagnosis not present

## 2024-07-21 DIAGNOSIS — I10 Essential (primary) hypertension: Secondary | ICD-10-CM | POA: Diagnosis not present

## 2024-07-21 DIAGNOSIS — M4646 Discitis, unspecified, lumbar region: Secondary | ICD-10-CM | POA: Diagnosis not present

## 2024-07-21 DIAGNOSIS — G500 Trigeminal neuralgia: Secondary | ICD-10-CM | POA: Diagnosis not present

## 2024-07-21 DIAGNOSIS — J449 Chronic obstructive pulmonary disease, unspecified: Secondary | ICD-10-CM | POA: Diagnosis not present

## 2024-07-21 DIAGNOSIS — M4626 Osteomyelitis of vertebra, lumbar region: Secondary | ICD-10-CM | POA: Diagnosis not present

## 2024-07-21 DIAGNOSIS — T8452XA Infection and inflammatory reaction due to internal left hip prosthesis, initial encounter: Secondary | ICD-10-CM | POA: Diagnosis not present

## 2024-07-26 DIAGNOSIS — M4646 Discitis, unspecified, lumbar region: Secondary | ICD-10-CM | POA: Diagnosis not present

## 2024-07-27 DIAGNOSIS — Z961 Presence of intraocular lens: Secondary | ICD-10-CM | POA: Diagnosis not present

## 2024-07-27 DIAGNOSIS — H26492 Other secondary cataract, left eye: Secondary | ICD-10-CM | POA: Diagnosis not present

## 2024-07-27 DIAGNOSIS — H2129 Other iris atrophy: Secondary | ICD-10-CM | POA: Diagnosis not present

## 2024-07-27 DIAGNOSIS — H52223 Regular astigmatism, bilateral: Secondary | ICD-10-CM | POA: Diagnosis not present

## 2024-07-27 DIAGNOSIS — H26491 Other secondary cataract, right eye: Secondary | ICD-10-CM | POA: Diagnosis not present

## 2024-07-27 DIAGNOSIS — H5203 Hypermetropia, bilateral: Secondary | ICD-10-CM | POA: Diagnosis not present

## 2024-07-29 DIAGNOSIS — M4626 Osteomyelitis of vertebra, lumbar region: Secondary | ICD-10-CM | POA: Diagnosis not present

## 2024-08-02 DIAGNOSIS — M4626 Osteomyelitis of vertebra, lumbar region: Secondary | ICD-10-CM | POA: Diagnosis not present

## 2024-08-05 DIAGNOSIS — M4626 Osteomyelitis of vertebra, lumbar region: Secondary | ICD-10-CM | POA: Diagnosis not present

## 2024-08-09 DIAGNOSIS — M4646 Discitis, unspecified, lumbar region: Secondary | ICD-10-CM | POA: Diagnosis not present

## 2024-08-11 ENCOUNTER — Ambulatory Visit: Admitting: Infectious Diseases

## 2024-08-11 DIAGNOSIS — M4626 Osteomyelitis of vertebra, lumbar region: Secondary | ICD-10-CM | POA: Diagnosis not present

## 2024-08-16 ENCOUNTER — Telehealth: Payer: Self-pay

## 2024-08-16 ENCOUNTER — Other Ambulatory Visit
Admission: RE | Admit: 2024-08-16 | Discharge: 2024-08-16 | Disposition: A | Discharge status: Home / Self Care | Source: Ambulatory Visit | Attending: Infectious Diseases | Admitting: Infectious Diseases

## 2024-08-16 ENCOUNTER — Ambulatory Visit: Attending: Infectious Diseases | Admitting: Infectious Diseases

## 2024-08-16 ENCOUNTER — Encounter: Payer: Self-pay | Admitting: Infectious Diseases

## 2024-08-16 VITALS — BP 131/86 | HR 74 | Temp 97.2°F | Ht 71.0 in | Wt 199.0 lb

## 2024-08-16 DIAGNOSIS — Z79899 Other long term (current) drug therapy: Secondary | ICD-10-CM | POA: Insufficient documentation

## 2024-08-16 DIAGNOSIS — G500 Trigeminal neuralgia: Secondary | ICD-10-CM

## 2024-08-16 DIAGNOSIS — B9562 Methicillin resistant Staphylococcus aureus infection as the cause of diseases classified elsewhere: Secondary | ICD-10-CM | POA: Diagnosis not present

## 2024-08-16 DIAGNOSIS — R21 Rash and other nonspecific skin eruption: Secondary | ICD-10-CM | POA: Diagnosis not present

## 2024-08-16 DIAGNOSIS — M4625 Osteomyelitis of vertebra, thoracolumbar region: Secondary | ICD-10-CM | POA: Diagnosis not present

## 2024-08-16 DIAGNOSIS — Z981 Arthrodesis status: Secondary | ICD-10-CM | POA: Diagnosis not present

## 2024-08-16 DIAGNOSIS — T8452XA Infection and inflammatory reaction due to internal left hip prosthesis, initial encounter: Secondary | ICD-10-CM | POA: Insufficient documentation

## 2024-08-16 DIAGNOSIS — M4646 Discitis, unspecified, lumbar region: Secondary | ICD-10-CM | POA: Diagnosis not present

## 2024-08-16 DIAGNOSIS — M4645 Discitis, unspecified, thoracolumbar region: Secondary | ICD-10-CM

## 2024-08-16 DIAGNOSIS — Z792 Long term (current) use of antibiotics: Secondary | ICD-10-CM | POA: Diagnosis not present

## 2024-08-16 DIAGNOSIS — I10 Essential (primary) hypertension: Secondary | ICD-10-CM | POA: Insufficient documentation

## 2024-08-16 DIAGNOSIS — R2689 Other abnormalities of gait and mobility: Secondary | ICD-10-CM | POA: Insufficient documentation

## 2024-08-16 DIAGNOSIS — T8452XD Infection and inflammatory reaction due to internal left hip prosthesis, subsequent encounter: Secondary | ICD-10-CM | POA: Diagnosis not present

## 2024-08-16 LAB — CBC WITH DIFFERENTIAL/PLATELET
Abs Immature Granulocytes: 0.03 K/uL (ref 0.00–0.07)
Basophils Absolute: 0.1 K/uL (ref 0.0–0.1)
Basophils Relative: 1 %
Eosinophils Absolute: 0.5 K/uL (ref 0.0–0.5)
Eosinophils Relative: 6 %
HCT: 38.9 % — ABNORMAL LOW (ref 39.0–52.0)
Hemoglobin: 12.3 g/dL — ABNORMAL LOW (ref 13.0–17.0)
Immature Granulocytes: 0 %
Lymphocytes Relative: 22 %
Lymphs Abs: 1.5 K/uL (ref 0.7–4.0)
MCH: 30.4 pg (ref 26.0–34.0)
MCHC: 31.6 g/dL (ref 30.0–36.0)
MCV: 96.3 fL (ref 80.0–100.0)
Monocytes Absolute: 0.6 K/uL (ref 0.1–1.0)
Monocytes Relative: 8 %
Neutro Abs: 4.5 K/uL (ref 1.7–7.7)
Neutrophils Relative %: 63 %
Platelets: 285 K/uL (ref 150–400)
RBC: 4.04 MIL/uL — ABNORMAL LOW (ref 4.22–5.81)
RDW: 13.1 % (ref 11.5–15.5)
WBC: 7.1 K/uL (ref 4.0–10.5)
nRBC: 0 % (ref 0.0–0.2)

## 2024-08-16 LAB — SEDIMENTATION RATE: Sed Rate: 25 mm/h — ABNORMAL HIGH (ref 0–20)

## 2024-08-16 LAB — COMPREHENSIVE METABOLIC PANEL WITH GFR
ALT: 20 U/L (ref 0–44)
AST: 23 U/L (ref 15–41)
Albumin: 4.3 g/dL (ref 3.5–5.0)
Alkaline Phosphatase: 196 U/L — ABNORMAL HIGH (ref 38–126)
Anion gap: 9 (ref 5–15)
BUN: 21 mg/dL (ref 8–23)
CO2: 28 mmol/L (ref 22–32)
Calcium: 9.2 mg/dL (ref 8.9–10.3)
Chloride: 104 mmol/L (ref 98–111)
Creatinine, Ser: 1.01 mg/dL (ref 0.61–1.24)
GFR, Estimated: >60 mL/min (ref >60)
Glucose, Bld: 106 mg/dL — ABNORMAL HIGH (ref 70–99)
Potassium: 4.6 mmol/L (ref 3.5–5.1)
Sodium: 142 mmol/L (ref 135–145)
Total Bilirubin: 0.2 mg/dL (ref 0.0–1.2)
Total Protein: 7.1 g/dL (ref 6.5–8.1)

## 2024-08-16 LAB — C-REACTIVE PROTEIN: CRP: 0.8 mg/dL (ref <1.0)

## 2024-08-16 NOTE — Progress Notes (Signed)
 NAME: Bryan Frey  DOB: 1946-10-04  MRN: [id number]  Date/Time: 08/16/2024 11:05 AM   Subjective:  Patient is here with his wife Agreed to the use of AI scribe Doing better from pain perspective Still not able to walk independently- but does good with a walker Completed 8 weeks of dapto and ceftriaxone  on 08/14/24 for culture neg discitis   Bryan Frey is a 78 -year-old male with a history of hypertension, trigeminal neuralgia on carbamazepine , Lumbar fusion at L2-3, L3-4 and L4-5. 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 for which he had IV antibiotic followed by PO doxy suppressive therapy and was left with intermittent draining fistula and  he is scheduled to undergo a two-stage revision of the left hip at [facility] in January 2026 was admitted to [facility] in  September following a fall landing on his back and since then having excruciating pain radiating down his legs with weakness and unable to walk He saw his neurosurgeon Bryan Frey who had done his previous fusion, on 06/17/2024 in  his clinic as the MRI done on 06/14/2024 was showing discitis and osteomyelitis at the level of T12 and L1.  Bryan. Colon recommended the patient be admitted to hospital for management. No surgical intervention was deemend necessary  He was hospitalized at [facility] between 06/17/2024 until 06/22/2024. IR was consulted during this hospitalization and they did aspiration of the disc space on 06/20/24  and the culture was negative Blood culture  06/17/24 was negative Labs showed normal WBC The sed rate was 14 He was seen by infectious disease and was started on daptomycin  and ceftriaxone  to complete  8 weeks of antibiotics He is here to follow-up with me as I was taking care of his left hip infection as well His main complaint is inability to walk because of the pain    Past history of complicated PJI left hip  left  hip hemiarthroplasty X2  last revision July 2021 , developed a soft blister on the surgical scar in Aug  2023, - the blister opened up and he had lot of discharge- He saw ortho on 06/02/22 and had imaging suspicious for a sinus tract- HE had culture of the fluid which grew MSSA- On 06/06/22 he underwent washout and polyeytlene capsule exchange. 3 cultures were sent- On 9/18 it was finalized as staph lugdunensis from the joint and the culture from under IT band- both these cultures have different Susceptibility- one was oxacillin susceptible and the other was oxacillin resistant, as the sample was not available for confirmation by the time I saw him he was started on Iv dapto and PO rifampin  on 06/28/22 He completed 6 weeks of dapto on 08/07/22- Rifampin  was stopped before that because  itching on his back and papulopustular eruption on the rt side of his back which likely was not rifampin  as it was persisiting even after that  After finishing dapto he went  on PO keflex  1 gram Q6 + bactrim  DS 1 BID, which was changed to Doxycycline  Feb 2nd week of 2024  doxycycline  was discontinued on 12/16/2022 because of persistent discharge and him wondering whether  the doxycycline  was causing the rash on his back even though he has had this unilateral acneform like lesions on the right side for the prior  6 months. He then took doxy again in late August for discharge and stopped in Sept 2024 and did not  take any until Jan 2025.  I saw him in Jan 2025 for a soft swelling at the surgical site-and it was MRSA on 2 different cultures. Placed him on Doxycycline  which he took until sept 2025 Pt was referred to [facility] [place] by Bryan Frey for explantation of the hardware- and revision. He had seen Bryan Frey and he was planning a 2 stage procedure in jan 2026 The left hip sinus had healed and there is no discharge in a long time   Past Medical History:  Diagnosis Date   Acquired short leg syndrome on left    Anemia    vitamin  b12 deficiency   Anxiety    with diagnosis   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 removed tumor and all is clear since then   Nephrolithiasis    Neuromuscular disorder (HCC)    facial nerve pain   Spinal stenosis of lumbar region    with neurogenic claudication   Stroke (HCC) 2010   heat stroke d/t a hot day and working under a house.no problems after   Trigeminal neuralgia     Past Surgical History:  Procedure Laterality Date   BACK SURGERY     CATARACT EXTRACTION W/PHACO Left 12/24/2017   Procedure: CATARACT EXTRACTION PHACO AND INTRAOCULAR LENS PLACEMENT (IOC);  Surgeon: Bryan Adine Anes, MD;  Location: [facility] ORS;  Service: Ophthalmology;  Laterality: Left;  Lot # [id number] H US : 00:30.4 AP%: 9.4 CDE: 2.84   CATARACT EXTRACTION W/PHACO Right 01/26/2018   Procedure: CATARACT EXTRACTION PHACO AND INTRAOCULAR LENS PLACEMENT (IOC) RIGHT;  Surgeon: Bryan Adine Anes, MD;  Location: [facility];  Service: Ophthalmology;  Laterality: Right;   COLONOSCOPY     COLONOSCOPY  12/03/2020   Procedure: COLONOSCOPY;  Surgeon: Aundria, Ladell POUR, MD;  Location: [facility];  Service: Gastroenterology;;   ESOPHAGOGASTRODUODENOSCOPY N/A 03/15/2024   Procedure: EGD (ESOPHAGOGASTRODUODENOSCOPY);  Surgeon: Toledo, Ladell POUR, MD;  Location: [facility] ENDOSCOPY;  Service: Gastroenterology;  Laterality: N/A;  Request Early AM Appt.   EYE SURGERY Bilateral    cataract extra tions   INCISION AND DRAINAGE HIP Left 06/06/2022   Procedure: IRRIGATION AND DEBRIDEMENT OF LEFT HIP WITH FEMORAL HEAD EXCHANGE;  Surgeon: Bryan Lynwood SQUIBB, MD;  Location: [facility];  Service: Orthopedics;  Laterality: Left;    IR RADIOLOGIST EVAL & MGMT  03/18/2017   JOINT REPLACEMENT Bilateral    left hip replaced x 2, right hip x 1   Left total hip arthroplasty  08/07/2010   LUMBAR LAMINECTOMY/DECOMPRESSION MICRODISCECTOMY  02/09/2012   Procedure: LUMBAR LAMINECTOMY/DECOMPRESSION MICRODISCECTOMY 2 LEVELS;  Surgeon: Victory Gens, MD;  Location: [facility];  Service: Neurosurgery;  Laterality: Bilateral;  Bilateral Lumbar two-three,lumbar four-five laminectomies   MALONEY DILATION  03/15/2024   Procedure: DILATION, ESOPHAGUS, USING MALONEY DILATOR;  Surgeon: Aundria, Ladell POUR, MD;  Location: [facility] ENDOSCOPY;  Service: Gastroenterology;;   NEPHROLITHOTOMY Right 02/09/2014   Procedure: NEPHROLITHOTOMY PERCUTANEOUS;  Surgeon: Garnette Shack, MD;  Location: [facility];  Service: Urology;  Laterality: Right;   Right total hip arthroplasty  07/13/2014   TOTAL HIP REVISION  Left 05/14/2015   Procedure: TOTAL HIP REVISION;  Surgeon: Lynwood SHAUNNA Hue, MD;  Location: [facility];  Service: Orthopedics;  Laterality: Left;   TOTAL HIP REVISION Left 04/06/2020   Procedure: TOTAL HIP REVISION;  Surgeon: Hue Lynwood SHAUNNA, MD;  Location: [facility] OR[facility];  Service: Orthopedics;  Laterality: Left;    Social History   Socioeconomic History   Marital status: Married    Spouse name: Terri   Number of children: 2   Years of education: 16   Highest education level: Not on file  Occupational History   Occupation: emergency planning/management officer    Comment: retired  Tobacco Use   Smoking status: Never   Smokeless tobacco: Never  Vaping Use   Vaping status: Never Used  Substance and Sexual Activity   Alcohol use: No   Drug use: No    Types: Other-see comments    Comment: tramadol    Sexual activity: Not Currently  Other Topics Concern   Not on file  Social History Narrative   Lives with wife   Caffeine use: Drinks coffee/tea/soda occassionally.   Right handed   Social Drivers of Health   Financial Resource Strain: Low Risk  (07/07/2024)   Received from [facility]   Overall Financial Resource Strain (CARDIA)    Difficulty of Paying Living Expenses: Not hard at all  Food Insecurity: No Food Insecurity (07/07/2024)   Received from [facility]   Hunger Vital Sign    Within the past 12 months, you worried that your food would run out before you got the money to buy more.: Never true    Within the past 12 months, the food you bought just didn't last and you didn't have money to get more.: Never true  Transportation Needs: No Transportation Needs (07/07/2024)   Received from [facility] - Transportation    In the past 12 months, has lack of transportation kept you from medical appointments or from getting medications?: No    Lack of Transportation (Non-Medical): No  Physical Activity: Not on file  Stress: Not on file  Social Connections: Unknown (06/21/2024)   Social Connection and Isolation Panel    Frequency of Communication with Friends and Family: Not on file    Frequency of Social Gatherings with Friends and Family: Not on file    Attends Religious Services: 1 to 4 times per year    Active Member of [employer] or Organizations: Yes    Attends Banker Meetings: Not on file    Marital Status: Not on file  Intimate Partner Violence: Not At Risk (06/18/2024)   Humiliation, Afraid, Rape, and Kick questionnaire    Fear of Current or Ex-Partner: No    Emotionally Abused: No    Physically Abused: No    Sexually Abused: No    Family History  Problem Relation Age of Onset   Anesthesia problems Neg Hx    Allergies  Allergen Reactions   Morphine  And Codeine Other (See Comments)    Hallucinations   Statins Other (See Comments)    Other reaction(s): Muscle Pain    I? Current Outpatient Medications  Medication Sig Dispense Refill   aspirin  EC 81 MG tablet Take 81 mg by mouth daily. Swallow whole.     carbamazepine  (TEGRETOL ) 200 MG tablet Take 400 mg by mouth in the  morning and at bedtime.     cholecalciferol  (VITAMIN D3) 25 MCG (1000 UNIT) tablet Take 1,000 Units by  mouth daily.     Cholecalciferol  1.25 MG (50000 UT) capsule Take 50,000 Units by mouth once a week. Thursday     diphenhydramine -acetaminophen  (TYLENOL  PM) 25-500 MG TABS Take 2 tablets by mouth at bedtime.     DULoxetine  (CYMBALTA ) 60 MG capsule Take 60 mg by mouth 2 (two) times daily.     fluticasone (FLONASE) 50 MCG/ACT nasal spray Place 2 sprays into both nostrils daily.     gabapentin  (NEURONTIN ) 100 MG capsule Take 100 mg by mouth in the morning.     gabapentin  (NEURONTIN ) 300 MG capsule Take 1 capsule (300 mg total) by mouth at bedtime. 90 capsule 0   MAGNESIUM  PO Take 1 capsule by mouth daily at 2 PM.     Melatonin 5 MG CAPS Take 5 mg by mouth at bedtime.     pantoprazole  (PROTONIX ) 40 MG tablet Take 40 mg by mouth daily.     tamsulosin  (FLOMAX ) 0.4 MG CAPS capsule Take 1 capsule (0.4 mg total) by mouth once daily Take 30 minutes after same meal each day.     traMADol  (ULTRAM ) 50 MG tablet Take 1 tablet (50 mg total) by mouth every 4 (four) hours as needed for moderate pain (pain score 4-6). 15 tablet 0   TRELEGY ELLIPTA 200-62.5-25 MCG/ACT AEPB Inhale 1 puff into the lungs daily.     triamcinolone  (KENALOG ) 0.025 % cream Apply 1 Application topically at bedtime. On irritated areas on back     vitamin B-12 (CYANOCOBALAMIN ) 1000 MCG tablet Take 1,000 mcg by mouth daily.     No current facility-administered medications for this visit.     Abtx:  Anti-infectives (From admission, onward)    None       REVIEW OF SYSTEMS:  Const: negative fever, negative chills, negative weight loss Eyes: negative diplopia or visual changes, negative eye pain ENT: negative coryza, negative sore throat Resp: cough, no dyspnea, recently had COVID and has recovered Cards: negative for chest pain, palpitations, lower extremity edema GU: negative for frequency, dysuria and hematuria GI: Negative for  abdominal pain, diarrhea, bleeding, constipation Skin: as above Heme: negative for easy bruising and gum/nose bleeding MS: left hip surgical scar- no discharge Inability to walk due to left hip/back Neurolo:negative for headaches, dizziness, vertigo, memory problems  Psych: negative for feelings of anxiety, depression  Endocrine: negative for thyroid , diabetes Allergy/Immunology- as above  Objective:  VITALS:  BP 131/86   Pulse 74   Temp (!) 97.2 F (36.2 C) (Temporal)   Ht 5' 11 (1.803 m)   Wt 199 lb (90.3 kg)   SpO2 96%   BMI 27.75 kg/m   PHYSICAL EXAM:  General: Awake and alert, no distress, walks with a limp he has got a cane Head: Normocephalic, without obvious abnormality, atraumatic. Lungs: Clear to auscultation bilaterally. No Wheezing or Rhonchi. No rales. Heart: Regular rate and rhythm, no murmur, rub or gallop. Abdomen: did not examine Extremities:  08/16/24  Today the surgical scar has healed 07/07/24  11/05/23    Sept 26, 2024 left upper thigh surgical scar has healed.  There is puckering at 1 area   Acneform eruptions persist on the right side  Lymph: Cervical, supraclavicular normal. Neurologic: Grossly non-focal Pertinent Labs ESR-66>31 CRP-2.7>N   Microbiology: 06/03/22- Staph aureus 06/06/22 surgical culture Wound culture from left hip joint staph lugdunensis - S to oxacillin 9/1 culture from soft tissue- Staph L R to oxacillin  06/26/22- left hip fluid culture NG 08/05/22- L Hip fluid culture NG  11/27/22 staph epidermidis  01/01/23 superficial culture  staph epi and corynebacterium- skin contaminants  11/05/23 MRSA  11/06/23 MRSA  06/20/24  Lumbar disc aspiration NG   ? Impression/Recommendation New discitis and osteomyelitis of T12-L1 vertebrae Culture negative On daptomycin  and ceftriaxone  for 8 weeks from 06/20/24 until 08/15/24- completed 8 weeks PICC remove din clinic today Unable to walk except with walker Spoke to  neurosurgeon Bryan.Elsner- Will repeat MRI and he will se ehim after that Will get the latest labs done this week  H/o Lumbar fusion L2-3, L3-4 and L4-5.  Left hip prosthetic joint infection:  see above for details The sinus has closed for the moment and has not been draining for some time Bryan Frey had referred him to orth[facility] [place] and they were planning a 2 stage procedure in jan 2026  Trigeminal neuralgia on carbamazepine   HTN  H/o Papulo pustular rash on back- ? Acne\? Folliculitis -   Discussed the management with patient and his wife   Follow up PRN

## 2024-08-16 NOTE — Telephone Encounter (Signed)
 Picc line removed in office by Dr. Fayette.  Message sent to [employer] informing them of picc line removal. Bryan Frey, CMA

## 2024-08-16 NOTE — Patient Instructions (Signed)
 Today, you had a follow-up appointment to review your recovery after completing antibiotic treatment for discitis and osteomyelitis. We discussed your ongoing mobility issues and the upcoming surgical procedure for your left hip infection.  YOUR PLAN:  -DISCITIS AND OSTEOMYELITIS AT T12-L1: Discitis and osteomyelitis are infections in the spine that can cause severe pain and difficulty moving. You completed an eight-week course of antibiotics, and your recent lab results show normal inflammatory markers. We will discuss with Dr. Ciro the need for a repeat MRI to monitor your condition. We removed your picc today as you dont need any more antibiotics Continue using a walker for stability, and we drew labs today to keep an eye on your inflammatory markers.  -INFECTION OF LEFT HIP PROSTHESIS: An infection in your left hip replacement requires a two-stage surgical procedure to remove the infection and replace the prosthesis. The first stage will involve placing an antibiotic spacer to check for bacteria. There is no current drainage from your hip. Proceed with the planned surgery and ensure cultures are taken during the procedure to guide further antibiotic treatment.  -GAIT ABNORMALITY AND RISK OF FALLS: Your difficulty walking and risk of falling are likely due to the infection in your left hip and the previous fall. Continue using a walker for stability.  INSTRUCTIONS:  Please follow up with Dr. Ciro regarding the need for a repeat MRI. Proceed with your planned two-stage surgical procedure for your left hip infection. Continue using a walker for stability. Follow prn

## 2024-08-22 ENCOUNTER — Ambulatory Visit
Admission: RE | Admit: 2024-08-22 | Discharge: 2024-08-22 | Disposition: A | Discharge status: Home / Self Care | Source: Ambulatory Visit | Attending: Infectious Diseases | Admitting: Infectious Diseases

## 2024-08-22 DIAGNOSIS — M4646 Discitis, unspecified, lumbar region: Secondary | ICD-10-CM | POA: Insufficient documentation

## 2024-08-22 DIAGNOSIS — G9519 Other vascular myelopathies: Secondary | ICD-10-CM | POA: Diagnosis not present

## 2024-08-22 MED ORDER — GADOBUTROL 1 MMOL/ML IV SOLN
8.0000 mL | Freq: Once | INTRAVENOUS | Status: AC | PRN
  Administered 2024-08-22: 8 mL via INTRAVENOUS

## 2024-08-26 ENCOUNTER — Ambulatory Visit: Payer: Self-pay | Admitting: Infectious Diseases

## 2024-09-05 DIAGNOSIS — Z79899 Other long term (current) drug therapy: Secondary | ICD-10-CM | POA: Diagnosis not present

## 2024-09-05 DIAGNOSIS — Z23 Encounter for immunization: Secondary | ICD-10-CM | POA: Diagnosis not present

## 2024-09-05 DIAGNOSIS — M4646 Discitis, unspecified, lumbar region: Secondary | ICD-10-CM | POA: Diagnosis not present

## 2024-09-05 DIAGNOSIS — Z125 Encounter for screening for malignant neoplasm of prostate: Secondary | ICD-10-CM | POA: Diagnosis not present

## 2024-09-05 DIAGNOSIS — R739 Hyperglycemia, unspecified: Secondary | ICD-10-CM | POA: Diagnosis not present

## 2024-09-05 DIAGNOSIS — M009 Pyogenic arthritis, unspecified: Secondary | ICD-10-CM | POA: Diagnosis not present

## 2024-09-05 DIAGNOSIS — G500 Trigeminal neuralgia: Secondary | ICD-10-CM | POA: Diagnosis not present

## 2024-09-05 DIAGNOSIS — E7849 Other hyperlipidemia: Secondary | ICD-10-CM | POA: Diagnosis not present

## 2024-09-14 DIAGNOSIS — Z6828 Body mass index (BMI) 28.0-28.9, adult: Secondary | ICD-10-CM | POA: Diagnosis not present

## 2024-09-14 DIAGNOSIS — M4645 Discitis, unspecified, thoracolumbar region: Secondary | ICD-10-CM | POA: Diagnosis not present
